# Patient Record
Sex: Female | Born: 1950 | Race: Black or African American | Hispanic: No | Marital: Married | Smoking: Former smoker
Health system: Southern US, Community
[De-identification: ages and names within clinical notes are randomized; demographics above are authoritative.]

## PROBLEM LIST (undated history)

## (undated) DIAGNOSIS — D649 Anemia, unspecified: Secondary | ICD-10-CM

## (undated) DIAGNOSIS — E119 Type 2 diabetes mellitus without complications: Secondary | ICD-10-CM

## (undated) DIAGNOSIS — I10 Essential (primary) hypertension: Secondary | ICD-10-CM

## (undated) DIAGNOSIS — I509 Heart failure, unspecified: Secondary | ICD-10-CM

## (undated) DIAGNOSIS — M109 Gout, unspecified: Secondary | ICD-10-CM

## (undated) DIAGNOSIS — F329 Major depressive disorder, single episode, unspecified: Secondary | ICD-10-CM

## (undated) DIAGNOSIS — K219 Gastro-esophageal reflux disease without esophagitis: Secondary | ICD-10-CM

## (undated) DIAGNOSIS — F32A Depression, unspecified: Secondary | ICD-10-CM

## (undated) DIAGNOSIS — M1990 Unspecified osteoarthritis, unspecified site: Secondary | ICD-10-CM

## (undated) HISTORY — PX: EYE SURGERY: SHX253

## (undated) HISTORY — PX: OTHER SURGICAL HISTORY: SHX169

## (undated) HISTORY — PX: TONSILLECTOMY: SUR1361

## (undated) HISTORY — PX: ABDOMINAL HYSTERECTOMY: SHX81

---

## 2003-11-30 ENCOUNTER — Other Ambulatory Visit: Payer: Self-pay

## 2004-08-10 ENCOUNTER — Emergency Department: Payer: Self-pay | Admitting: Emergency Medicine

## 2004-11-29 ENCOUNTER — Other Ambulatory Visit: Payer: Self-pay

## 2004-11-29 ENCOUNTER — Ambulatory Visit: Payer: Self-pay | Admitting: Orthopedic Surgery

## 2004-12-05 ENCOUNTER — Ambulatory Visit: Payer: Self-pay | Admitting: Orthopedic Surgery

## 2005-06-18 ENCOUNTER — Emergency Department: Payer: Self-pay | Admitting: Internal Medicine

## 2006-01-01 ENCOUNTER — Ambulatory Visit: Payer: Self-pay | Admitting: Family Medicine

## 2006-09-16 ENCOUNTER — Emergency Department: Payer: Self-pay | Admitting: Emergency Medicine

## 2007-09-22 ENCOUNTER — Emergency Department: Payer: Self-pay | Admitting: Emergency Medicine

## 2008-01-16 ENCOUNTER — Emergency Department: Payer: Self-pay | Admitting: Emergency Medicine

## 2008-04-22 ENCOUNTER — Ambulatory Visit: Payer: Self-pay | Admitting: Family Medicine

## 2008-06-24 ENCOUNTER — Emergency Department: Payer: Self-pay | Admitting: Emergency Medicine

## 2009-07-20 ENCOUNTER — Emergency Department: Payer: Self-pay | Admitting: Emergency Medicine

## 2009-09-07 ENCOUNTER — Emergency Department: Payer: Self-pay | Admitting: Emergency Medicine

## 2009-09-26 ENCOUNTER — Ambulatory Visit: Payer: Self-pay | Admitting: Family Medicine

## 2009-09-29 ENCOUNTER — Emergency Department: Payer: Self-pay | Admitting: Emergency Medicine

## 2009-11-04 ENCOUNTER — Emergency Department: Payer: Self-pay | Admitting: Emergency Medicine

## 2009-11-17 ENCOUNTER — Ambulatory Visit: Payer: Self-pay | Admitting: Cardiology

## 2009-11-29 ENCOUNTER — Ambulatory Visit: Payer: Self-pay | Admitting: Internal Medicine

## 2010-02-09 ENCOUNTER — Inpatient Hospital Stay: Payer: Self-pay | Admitting: Internal Medicine

## 2010-03-09 ENCOUNTER — Ambulatory Visit: Payer: Self-pay | Admitting: Family

## 2010-04-26 ENCOUNTER — Ambulatory Visit: Payer: Self-pay | Admitting: Family

## 2010-08-23 ENCOUNTER — Ambulatory Visit: Payer: Self-pay | Admitting: Family

## 2010-09-14 ENCOUNTER — Emergency Department: Payer: Self-pay | Admitting: Emergency Medicine

## 2010-10-05 ENCOUNTER — Emergency Department: Payer: Self-pay | Admitting: Unknown Physician Specialty

## 2010-10-24 ENCOUNTER — Emergency Department: Payer: Self-pay | Admitting: Unknown Physician Specialty

## 2010-11-07 ENCOUNTER — Emergency Department: Payer: Self-pay | Admitting: Internal Medicine

## 2010-12-20 ENCOUNTER — Ambulatory Visit: Payer: Self-pay | Admitting: Family

## 2011-06-01 ENCOUNTER — Emergency Department: Payer: Self-pay | Admitting: Emergency Medicine

## 2011-12-07 ENCOUNTER — Emergency Department: Payer: Self-pay | Admitting: Unknown Physician Specialty

## 2011-12-07 LAB — URINALYSIS, COMPLETE
Blood: NEGATIVE
Glucose,UR: NEGATIVE mg/dL (ref 0–75)
Ketone: NEGATIVE
Nitrite: NEGATIVE
Specific Gravity: 1.015 (ref 1.003–1.030)
Squamous Epithelial: 2
WBC UR: 7 /HPF (ref 0–5)

## 2011-12-07 LAB — CBC WITH DIFFERENTIAL/PLATELET
Basophil %: 0.6 %
Eosinophil %: 2.7 %
HCT: 35.4 % (ref 35.0–47.0)
HGB: 11.2 g/dL — ABNORMAL LOW (ref 12.0–16.0)
Lymphocyte #: 2.4 10*3/uL (ref 1.0–3.6)
Lymphocyte %: 32.1 %
MCHC: 31.8 g/dL — ABNORMAL LOW (ref 32.0–36.0)
MCV: 74 fL — ABNORMAL LOW (ref 80–100)
Monocyte #: 0.8 10*3/uL — ABNORMAL HIGH (ref 0.0–0.7)
Monocyte %: 11.3 %
Neutrophil #: 3.9 10*3/uL (ref 1.4–6.5)
WBC: 7.4 10*3/uL (ref 3.6–11.0)

## 2011-12-07 LAB — TROPONIN I: Troponin-I: <0.02 ng/mL

## 2011-12-07 LAB — COMPREHENSIVE METABOLIC PANEL
Albumin: 3.7 g/dL (ref 3.4–5.0)
BUN: 19 mg/dL — ABNORMAL HIGH (ref 7–18)
Bilirubin,Total: 0.3 mg/dL (ref 0.2–1.0)
Chloride: 104 mmol/L (ref 98–107)
Creatinine: 0.72 mg/dL (ref 0.60–1.30)
EGFR (African American): >60
EGFR (Non-African Amer.): >60
Glucose: 118 mg/dL — ABNORMAL HIGH (ref 65–99)
Osmolality: 286 (ref 275–301)
Potassium: 4 mmol/L (ref 3.5–5.1)
SGOT(AST): 25 U/L (ref 15–37)
SGPT (ALT): 33 U/L
Sodium: 142 mmol/L (ref 136–145)
Total Protein: 8 g/dL (ref 6.4–8.2)

## 2011-12-07 LAB — CK TOTAL AND CKMB (NOT AT ARMC): CK, Total: 77 U/L (ref 21–215)

## 2011-12-07 LAB — PROTIME-INR
INR: 0.9
Prothrombin Time: 12.2 secs (ref 11.5–14.7)

## 2012-03-21 ENCOUNTER — Ambulatory Visit: Payer: Self-pay | Admitting: Internal Medicine

## 2012-10-16 ENCOUNTER — Emergency Department: Payer: Self-pay | Admitting: Emergency Medicine

## 2012-10-16 LAB — CBC
HCT: 33.4 % — ABNORMAL LOW (ref 35.0–47.0)
HGB: 10.6 g/dL — ABNORMAL LOW (ref 12.0–16.0)
MCH: 23.1 pg — ABNORMAL LOW (ref 26.0–34.0)
MCHC: 31.8 g/dL — ABNORMAL LOW (ref 32.0–36.0)
MCV: 73 fL — ABNORMAL LOW (ref 80–100)
RDW: 17.4 % — ABNORMAL HIGH (ref 11.5–14.5)
WBC: 10.7 10*3/uL (ref 3.6–11.0)

## 2012-10-16 LAB — CK TOTAL AND CKMB (NOT AT ARMC)
CK, Total: 105 U/L (ref 21–215)
CK-MB: 0.8 ng/mL (ref 0.5–3.6)

## 2012-10-16 LAB — COMPREHENSIVE METABOLIC PANEL
Albumin: 3.5 g/dL (ref 3.4–5.0)
Anion Gap: 10 (ref 7–16)
Calcium, Total: 8.8 mg/dL (ref 8.5–10.1)
Co2: 23 mmol/L (ref 21–32)
EGFR (Non-African Amer.): >60
Glucose: 161 mg/dL — ABNORMAL HIGH (ref 65–99)
Potassium: 3.8 mmol/L (ref 3.5–5.1)
SGOT(AST): 25 U/L (ref 15–37)
SGPT (ALT): 31 U/L (ref 12–78)
Total Protein: 7.7 g/dL (ref 6.4–8.2)

## 2012-10-16 LAB — PROTIME-INR: INR: 1

## 2012-12-09 ENCOUNTER — Emergency Department: Payer: Self-pay | Admitting: Emergency Medicine

## 2012-12-09 LAB — URINALYSIS, COMPLETE
Glucose,UR: NEGATIVE mg/dL (ref 0–75)
Hyaline Cast: 30
Ph: 5 (ref 4.5–8.0)
Protein: NEGATIVE
Transitional Epi: <1
WBC UR: 5 /HPF (ref 0–5)

## 2012-12-09 LAB — CBC
HCT: 39.1 % (ref 35.0–47.0)
HGB: 12.6 g/dL (ref 12.0–16.0)
MCHC: 32.3 g/dL (ref 32.0–36.0)
MCV: 73 fL — ABNORMAL LOW (ref 80–100)
RBC: 5.37 10*6/uL — ABNORMAL HIGH (ref 3.80–5.20)
RDW: 17.3 % — ABNORMAL HIGH (ref 11.5–14.5)
WBC: 5.3 10*3/uL (ref 3.6–11.0)

## 2012-12-09 LAB — COMPREHENSIVE METABOLIC PANEL
Alkaline Phosphatase: 158 U/L — ABNORMAL HIGH (ref 50–136)
Anion Gap: 9 (ref 7–16)
Calcium, Total: 9 mg/dL (ref 8.5–10.1)
Chloride: 104 mmol/L (ref 98–107)
Creatinine: 0.88 mg/dL (ref 0.60–1.30)
EGFR (African American): >60
Glucose: 204 mg/dL — ABNORMAL HIGH (ref 65–99)
Potassium: 3.6 mmol/L (ref 3.5–5.1)
SGOT(AST): 119 U/L — ABNORMAL HIGH (ref 15–37)
SGPT (ALT): 118 U/L — ABNORMAL HIGH (ref 12–78)

## 2012-12-09 LAB — CK TOTAL AND CKMB (NOT AT ARMC)
CK, Total: 91 U/L (ref 21–215)
CK-MB: <0.5 ng/mL — ABNORMAL LOW (ref 0.5–3.6)

## 2012-12-09 LAB — TROPONIN I: Troponin-I: <0.02 ng/mL

## 2012-12-09 LAB — PRO B NATRIURETIC PEPTIDE: B-Type Natriuretic Peptide: 14 pg/mL (ref 0–125)

## 2012-12-09 LAB — LIPASE, BLOOD: Lipase: 134 U/L (ref 73–393)

## 2013-03-25 ENCOUNTER — Emergency Department: Payer: Self-pay | Admitting: Emergency Medicine

## 2013-08-13 ENCOUNTER — Ambulatory Visit: Payer: Self-pay | Admitting: Internal Medicine

## 2013-09-18 ENCOUNTER — Emergency Department: Payer: Self-pay | Admitting: Emergency Medicine

## 2013-09-18 LAB — URINALYSIS, COMPLETE
Glucose,UR: NEGATIVE mg/dL (ref 0–75)
RBC,UR: 1 /HPF (ref 0–5)
Specific Gravity: 1.013 (ref 1.003–1.030)
WBC UR: 37 /HPF (ref 0–5)

## 2013-09-20 ENCOUNTER — Emergency Department: Payer: Self-pay | Admitting: Emergency Medicine

## 2014-03-21 DIAGNOSIS — E1169 Type 2 diabetes mellitus with other specified complication: Secondary | ICD-10-CM | POA: Insufficient documentation

## 2014-03-21 DIAGNOSIS — I10 Essential (primary) hypertension: Secondary | ICD-10-CM | POA: Diagnosis present

## 2014-07-26 ENCOUNTER — Emergency Department: Payer: Self-pay | Admitting: Emergency Medicine

## 2014-08-01 ENCOUNTER — Emergency Department: Payer: Self-pay | Admitting: Emergency Medicine

## 2014-09-02 ENCOUNTER — Emergency Department: Payer: Self-pay | Admitting: Emergency Medicine

## 2014-09-02 LAB — BASIC METABOLIC PANEL
Anion Gap: 6 — ABNORMAL LOW (ref 7–16)
BUN: 14 mg/dL (ref 7–18)
Calcium, Total: 8.4 mg/dL — ABNORMAL LOW (ref 8.5–10.1)
Chloride: 104 mmol/L (ref 98–107)
Co2: 28 mmol/L (ref 21–32)
Creatinine: 0.79 mg/dL (ref 0.60–1.30)
EGFR (African American): >60
GLUCOSE: 174 mg/dL — AB (ref 65–99)
Osmolality: 280 (ref 275–301)
POTASSIUM: 3.8 mmol/L (ref 3.5–5.1)
Sodium: 138 mmol/L (ref 136–145)

## 2014-09-02 LAB — CBC WITH DIFFERENTIAL/PLATELET
BASOS ABS: 0.1 10*3/uL (ref 0.0–0.1)
Basophil %: 0.6 %
EOS PCT: 2.4 %
Eosinophil #: 0.3 10*3/uL (ref 0.0–0.7)
HCT: 37.6 % (ref 35.0–47.0)
HGB: 11.8 g/dL — ABNORMAL LOW (ref 12.0–16.0)
Lymphocyte #: 1.7 10*3/uL (ref 1.0–3.6)
Lymphocyte %: 16 %
MCH: 23.2 pg — ABNORMAL LOW (ref 26.0–34.0)
MCHC: 31.3 g/dL — ABNORMAL LOW (ref 32.0–36.0)
MCV: 74 fL — AB (ref 80–100)
Monocyte #: 0.7 x10 3/mm (ref 0.2–0.9)
Monocyte %: 6.8 %
NEUTROS ABS: 7.9 10*3/uL — AB (ref 1.4–6.5)
NEUTROS PCT: 74.2 %
PLATELETS: 202 10*3/uL (ref 150–440)
RBC: 5.07 10*6/uL (ref 3.80–5.20)
RDW: 15.8 % — ABNORMAL HIGH (ref 11.5–14.5)
WBC: 10.6 10*3/uL (ref 3.6–11.0)

## 2014-09-02 LAB — ED INFLUENZA
H1N1 flu by pcr: NOT DETECTED
INFLBPCR: NEGATIVE
Influenza A By PCR: NEGATIVE

## 2014-09-02 LAB — TROPONIN I: Troponin-I: <0.02 ng/mL

## 2014-10-18 ENCOUNTER — Other Ambulatory Visit: Payer: Self-pay | Admitting: Physical Medicine and Rehabilitation

## 2014-10-18 DIAGNOSIS — M5416 Radiculopathy, lumbar region: Secondary | ICD-10-CM

## 2014-10-24 ENCOUNTER — Other Ambulatory Visit: Payer: Self-pay

## 2014-12-12 ENCOUNTER — Emergency Department: Payer: Self-pay | Admitting: Emergency Medicine

## 2015-01-11 ENCOUNTER — Emergency Department: Payer: Self-pay | Admitting: Emergency Medicine

## 2015-02-09 ENCOUNTER — Ambulatory Visit
Admission: RE | Admit: 2015-02-09 | Discharge: 2015-02-09 | Disposition: A | Discharge status: Home / Self Care | Payer: No Typology Code available for payment source | Source: Ambulatory Visit | Attending: Physical Medicine and Rehabilitation | Admitting: Physical Medicine and Rehabilitation

## 2015-02-09 DIAGNOSIS — M5416 Radiculopathy, lumbar region: Secondary | ICD-10-CM

## 2015-03-27 ENCOUNTER — Emergency Department
Admission: EM | Admit: 2015-03-27 | Discharge: 2015-03-28 | Disposition: A | Discharge status: Home / Self Care | Payer: Medicare Other | Attending: Emergency Medicine | Admitting: Emergency Medicine

## 2015-03-27 ENCOUNTER — Other Ambulatory Visit: Payer: Self-pay

## 2015-03-27 ENCOUNTER — Encounter: Payer: Self-pay | Admitting: Emergency Medicine

## 2015-03-27 ENCOUNTER — Emergency Department: Payer: Medicare Other

## 2015-03-27 DIAGNOSIS — Z87891 Personal history of nicotine dependence: Secondary | ICD-10-CM | POA: Diagnosis not present

## 2015-03-27 DIAGNOSIS — R1012 Left upper quadrant pain: Secondary | ICD-10-CM | POA: Diagnosis not present

## 2015-03-27 DIAGNOSIS — Z794 Long term (current) use of insulin: Secondary | ICD-10-CM | POA: Diagnosis not present

## 2015-03-27 DIAGNOSIS — Z79899 Other long term (current) drug therapy: Secondary | ICD-10-CM | POA: Diagnosis not present

## 2015-03-27 DIAGNOSIS — I10 Essential (primary) hypertension: Secondary | ICD-10-CM | POA: Insufficient documentation

## 2015-03-27 DIAGNOSIS — E119 Type 2 diabetes mellitus without complications: Secondary | ICD-10-CM | POA: Insufficient documentation

## 2015-03-27 DIAGNOSIS — R079 Chest pain, unspecified: Secondary | ICD-10-CM

## 2015-03-27 HISTORY — DX: Type 2 diabetes mellitus without complications: E11.9

## 2015-03-27 HISTORY — DX: Heart failure, unspecified: I50.9

## 2015-03-27 HISTORY — DX: Essential (primary) hypertension: I10

## 2015-03-27 LAB — BASIC METABOLIC PANEL
ANION GAP: 11 (ref 5–15)
BUN: 12 mg/dL (ref 6–20)
CALCIUM: 9.1 mg/dL (ref 8.9–10.3)
CO2: 27 mmol/L (ref 22–32)
Chloride: 100 mmol/L — ABNORMAL LOW (ref 101–111)
Creatinine, Ser: 0.81 mg/dL (ref 0.44–1.00)
GFR calc Af Amer: >60 mL/min (ref >60)
Glucose, Bld: 202 mg/dL — ABNORMAL HIGH (ref 65–99)
Potassium: 3.9 mmol/L (ref 3.5–5.1)
SODIUM: 138 mmol/L (ref 135–145)

## 2015-03-27 LAB — CBC
HCT: 37.2 % (ref 35.0–47.0)
Hemoglobin: 11.8 g/dL — ABNORMAL LOW (ref 12.0–16.0)
MCH: 22.8 pg — AB (ref 26.0–34.0)
MCHC: 31.6 g/dL — ABNORMAL LOW (ref 32.0–36.0)
MCV: 72.1 fL — AB (ref 80.0–100.0)
PLATELETS: 247 10*3/uL (ref 150–440)
RBC: 5.15 MIL/uL (ref 3.80–5.20)
RDW: 15.6 % — AB (ref 11.5–14.5)
WBC: 11.2 10*3/uL — ABNORMAL HIGH (ref 3.6–11.0)

## 2015-03-27 LAB — BRAIN NATRIURETIC PEPTIDE: B Natriuretic Peptide: 32 pg/mL (ref 0.0–100.0)

## 2015-03-27 LAB — TROPONIN I: Troponin I: <0.03 ng/mL (ref <0.031)

## 2015-03-27 NOTE — ED Notes (Signed)
Patient with complaint of left side chest, shoulder and upper back pain that started this morning. Patient with complaint of shortness of breath.

## 2015-03-28 ENCOUNTER — Emergency Department: Payer: Medicare Other

## 2015-03-28 DIAGNOSIS — R079 Chest pain, unspecified: Secondary | ICD-10-CM | POA: Diagnosis not present

## 2015-03-28 LAB — TROPONIN I: Troponin I: <0.03 ng/mL (ref <0.031)

## 2015-03-28 MED ORDER — GI COCKTAIL ~~LOC~~
30.0000 mL | Freq: Once | ORAL | Status: AC
  Administered 2015-03-28: 30 mL via ORAL

## 2015-03-28 MED ORDER — SUCRALFATE 1 G PO TABS: 1.0000 g | ORAL_TABLET | Freq: Four times a day (QID) | ORAL | Status: DC

## 2015-03-28 MED ORDER — NITROGLYCERIN 0.4 MG SL SUBL
0.4000 mg | SUBLINGUAL_TABLET | SUBLINGUAL | Status: DC | PRN
  Administered 2015-03-28: 0.4 mg via SUBLINGUAL

## 2015-03-28 MED ORDER — SUCRALFATE 1 G PO TABS
ORAL_TABLET | ORAL | Status: AC
  Administered 2015-03-28: 1 g via ORAL
  Filled 2015-03-28: qty 1

## 2015-03-28 MED ORDER — GI COCKTAIL ~~LOC~~
ORAL | Status: AC
  Administered 2015-03-28: 30 mL via ORAL
  Filled 2015-03-28: qty 30

## 2015-03-28 MED ORDER — NITROGLYCERIN 0.4 MG SL SUBL
SUBLINGUAL_TABLET | SUBLINGUAL | Status: AC
  Administered 2015-03-28: 0.4 mg via SUBLINGUAL
  Filled 2015-03-28: qty 1

## 2015-03-28 MED ORDER — SUCRALFATE 1 G PO TABS
1.0000 g | ORAL_TABLET | Freq: Once | ORAL | Status: AC
  Administered 2015-03-28: 1 g via ORAL

## 2015-03-28 MED ORDER — IOHEXOL 350 MG/ML SOLN
100.0000 mL | Freq: Once | INTRAVENOUS | Status: AC | PRN
  Administered 2015-03-28: 100 mL via INTRAVENOUS

## 2015-03-28 NOTE — Discharge Instructions (Signed)
Abdominal Pain °Many things can cause abdominal pain. Usually, abdominal pain is not caused by a disease and will improve without treatment. It can often be observed and treated at home. Your health care provider will do a physical exam and possibly order blood tests and X-rays to help determine the seriousness of your pain. However, in many cases, more time must pass before a clear cause of the pain can be found. Before that point, your health care provider may not know if you need more testing or further treatment. °HOME CARE INSTRUCTIONS  °Monitor your abdominal pain for any changes. The following actions may help to alleviate any discomfort you are experiencing: °· Only take over-the-counter or prescription medicines as directed by your health care provider. °· Do not take laxatives unless directed to do so by your health care provider. °· Try a clear liquid diet (broth, tea, or water) as directed by your health care provider. Slowly move to a bland diet as tolerated. °SEEK MEDICAL CARE IF: °· You have unexplained abdominal pain. °· You have abdominal pain associated with nausea or diarrhea. °· You have pain when you urinate or have a bowel movement. °· You experience abdominal pain that wakes you in the night. °· You have abdominal pain that is worsened or improved by eating food. °· You have abdominal pain that is worsened with eating fatty foods. °· You have a fever. °SEEK IMMEDIATE MEDICAL CARE IF:  °· Your pain does not go away within 2 hours. °· You keep throwing up (vomiting). °· Your pain is felt only in portions of the abdomen, such as the right side or the left lower portion of the abdomen. °· You pass bloody or black tarry stools. °MAKE SURE YOU: °· Understand these instructions.   °· Will watch your condition.   °· Will get help right away if you are not doing well or get worse.   °Document Released: 07/18/2005 Document Revised: 10/13/2013 Document Reviewed: 06/17/2013 °[employer]® Patient Information  ©2015 [employer]. This information is not intended to replace advice given to you by your health care provider. Make sure you discuss any questions you have with your health care provider. ° °Chest Pain (Nonspecific) °It is often hard to give a specific diagnosis for the cause of chest pain. There is always a chance that your pain could be related to something serious, such as a heart attack or a blood clot in the lungs. You need to follow up with your health care provider for further evaluation. °CAUSES  °· Heartburn. °· Pneumonia or bronchitis. °· Anxiety or stress. °· Inflammation around your heart (pericarditis) or lung (pleuritis or pleurisy). °· A blood clot in the lung. °· A collapsed lung (pneumothorax). It can develop suddenly on its own (spontaneous pneumothorax) or from trauma to the chest. °· Shingles infection (herpes zoster virus). °The chest wall is composed of bones, muscles, and cartilage. Any of these can be the source of the pain. °· The bones can be bruised by injury. °· The muscles or cartilage can be strained by coughing or overwork. °· The cartilage can be affected by inflammation and become sore (costochondritis). °DIAGNOSIS  °Lab tests or other studies may be needed to find the cause of your pain. Your health care provider may have you take a test called an ambulatory electrocardiogram (ECG). An ECG records your heartbeat patterns over a 24-hour period. You may also have other tests, such as: °· Transthoracic echocardiogram (TTE). During echocardiography, sound waves are used to evaluate how blood   flows through your heart. °· Transesophageal echocardiogram (TEE). °· Cardiac monitoring. This allows your health care provider to monitor your heart rate and rhythm in real time. °· Holter monitor. This is a portable device that records your heartbeat and can help diagnose heart arrhythmias. It allows your health care provider to track your heart activity for several days, if needed. °· Stress  tests by exercise or by giving medicine that makes the heart beat faster. °TREATMENT  °· Treatment depends on what may be causing your chest pain. Treatment may include: °¨ Acid blockers for heartburn. °¨ Anti-inflammatory medicine. °¨ Pain medicine for inflammatory conditions. °¨ Antibiotics if an infection is present. °· You may be advised to change lifestyle habits. This includes stopping smoking and avoiding alcohol, caffeine, and chocolate. °· You may be advised to keep your head raised (elevated) when sleeping. This reduces the chance of acid going backward from your stomach into your esophagus. °Most of the time, nonspecific chest pain will improve within 2-3 days with rest and mild pain medicine.  °HOME CARE INSTRUCTIONS  °· If antibiotics were prescribed, take them as directed. Finish them even if you start to feel better. °· For the next few days, avoid physical activities that bring on chest pain. Continue physical activities as directed. °· Do not use any tobacco products, including cigarettes, chewing tobacco, or electronic cigarettes. °· Avoid drinking alcohol. °· Only take medicine as directed by your health care provider. °· Follow your health care provider's suggestions for further testing if your chest pain does not go away. °· Keep any follow-up appointments you made. If you do not go to an appointment, you could develop lasting (chronic) problems with pain. If there is any problem keeping an appointment, call to reschedule. °SEEK MEDICAL CARE IF:  °· Your chest pain does not go away, even after treatment. °· You have a rash with blisters on your chest. °· You have a fever. °SEEK IMMEDIATE MEDICAL CARE IF:  °· You have increased chest pain or pain that spreads to your arm, neck, jaw, back, or abdomen. °· You have shortness of breath. °· You have an increasing cough, or you cough up blood. °· You have severe back or abdominal pain. °· You feel nauseous or vomit. °· You have severe weakness. °· You  faint. °· You have chills. °This is an emergency. Do not wait to see if the pain will go away. Get medical help at once. Call your local emergency services (911 in [place] Do not drive yourself to the hospital. °MAKE SURE YOU:  °· Understand these instructions. °· Will watch your condition. °· Will get help right away if you are not doing well or get worse. °Document Released: 07/18/2005 Document Revised: 10/13/2013 Document Reviewed: 05/13/2008 °[employer]® Patient Information ©2015 [employer]. This information is not intended to replace advice given to you by your health care provider. Make sure you discuss any questions you have with your health care provider. ° °

## 2015-03-28 NOTE — ED Notes (Signed)
Pt up to toilet in room to void; back to bed

## 2015-03-28 NOTE — ED Provider Notes (Signed)
[facility] Emergency Department Provider Note  ____________________________________________  Time seen: Approximately 1601 pm  I have reviewed the triage vital signs and the nursing notes.   HISTORY  Chief Complaint Chest Pain    HPI Betty Luna is a 64 y.o. female who comes in today with chest pain. She reports that she's had this pain all day under her left breast into her shoulder and her back. She reports that feels like gas. She took baking soda and omeprazole. She reports that the pain eases off but returns. She feels short of breath whenever she has this pain as well. She denies any sweats but has chills. She reports that the pain is 7 out of 10 and is aching in intensity. The patient reports that she feels that she needs to burp but she is unable to do so. Nothing makes the pain better but is worse whenever she lays down. She feels that she has had this pain before but it was a long time ago. She is nauseous with leg swelling but no vomiting. She reports that she was placed on omeprazole 2 weeks ago.   Past Medical History  Diagnosis Date  . CHF (congestive heart failure)   . Diabetes mellitus without complication   . Hypertension   . Multiple myeloma   Diabetes Hypertension GERD Arthritis Gout  There are no active problems to display for this patient.   No past surgical history on file.  Current Outpatient Rx  Name  Route  Sig  Dispense  Refill  . allopurinol (ZYLOPRIM) 300 MG tablet   Oral   Take 1 tablet by mouth daily.      5   . atorvastatin (LIPITOR) 20 MG tablet   Oral   Take 1 tablet by mouth daily.      11   . carvedilol (COREG) 25 MG tablet   Oral   Take 1 tablet by mouth 2 (two) times daily.      3   . colchicine 0.6 MG tablet   Oral   Take 1 tablet by mouth daily.      3   . insulin detemir (LEVEMIR) 100 UNIT/ML injection   Subcutaneous   Inject 174 Units into the skin at bedtime.         Marland Kitchen losartan  (COZAAR) 50 MG tablet   Oral   Take 1 tablet by mouth daily.      2   . metFORMIN (GLUCOPHAGE-XR) 500 MG 24 hr tablet   Oral   Take 1 tablet by mouth 2 (two) times daily.      1   . omeprazole (PRILOSEC) 40 MG capsule   Oral   Take 1 capsule by mouth daily.      4   . spironolactone (ALDACTONE) 25 MG tablet   Oral   Take 1 tablet by mouth daily.      3   . sucralfate (CARAFATE) 1 G tablet   Oral   Take 1 tablet (1 g total) by mouth 4 (four) times daily.   30 tablet   0   . torsemide (DEMADEX) 10 MG tablet   Oral   Take 1 tablet by mouth daily.      0     Allergies Codeine  No family history on file.  Social History History  Substance Use Topics  . Smoking status: Former Research scientist (life sciences)  . Smokeless tobacco: Not on file  . Alcohol Use: Yes     Comment: occasonally  Review of Systems Constitutional: chills Eyes: No visual changes. ENT: No sore throat. Cardiovascular:  chest pain. Respiratory:shortness of breath. Gastrointestinal: Nausea with no vomiting Genitourinary: Negative for dysuria. Musculoskeletal: back pain. Skin: Negative for rash. Neurological: Negative for headaches  10-point ROS otherwise negative.  ____________________________________________   PHYSICAL EXAM:  VITAL SIGNS: ED Triage Vitals  Enc Vitals Group     BP 03/27/15 2124 139/66 mmHg     Pulse Rate 03/27/15 2124 111     Resp 03/27/15 2124 24     Temp 03/27/15 2124 98.5 F (36.9 C)     Temp Source 03/27/15 2124 Oral     SpO2 03/27/15 2124 95 %     Weight 03/27/15 2124 244 lb (110.678 kg)     Height 03/27/15 2124 5' 3" (1.6 m)     Head Cir --      Peak Flow --      Pain Score 03/27/15 2125 10     Pain Loc --      Pain Edu? --      Excl. in [place]? --     Constitutional: Alert and oriented. Well appearing and in moderate distress. Eyes: Conjunctivae are normal. PERRL. EOMI. Head: Atraumatic. Nose: No congestion/rhinnorhea. Mouth/Throat: Mucous membranes are moist.   Oropharynx non-erythematous. Cardiovascular: Normal rate, regular rhythm. Grossly normal heart sounds.  Good peripheral circulation. Respiratory: Normal respiratory effort.  No retractions. Lungs CTAB. Gastrointestinal: Soft with left quadrant tenderness to palpation, positive bowel sounds that Genitourinary: Deferred Musculoskeletal: No lower extremity tenderness nor edema.  No joint effusions. Neurologic:  Normal speech and language. No gross focal neurologic deficits are appreciated.  Skin:  Skin is warm, dry and intact. No rash noted. Psychiatric: Mood and affect are normal.   ____________________________________________   LABS (all labs ordered are listed, but only abnormal results are displayed)  Labs Reviewed  CBC - Abnormal; Notable for the following:    WBC 11.2 (*)    Hemoglobin 11.8 (*)    MCV 72.1 (*)    MCH 22.8 (*)    MCHC 31.6 (*)    RDW 15.6 (*)    All other components within normal limits  BASIC METABOLIC PANEL - Abnormal; Notable for the following:    Chloride 100 (*)    Glucose, Bld 202 (*)    All other components within normal limits  BRAIN NATRIURETIC PEPTIDE  TROPONIN I  TROPONIN I   ____________________________________________  EKG  ED ECG REPORT I, Loney Hering, the attending physician, personally viewed and interpreted this ECG.   Date: 03/28/2015  EKG Time: 2133  Rate: 106  Rhythm: sinus tachycardia  Axis: Normal  Intervals:none  ST&T Change: Flattened T waves in V5 and V6 as well as leads 1 and aVL  ____________________________________________  RADIOLOGY  Chest x-ray: Low lung volumes, mild vascular congestion, focal area of consolidation not established but mild bibasilar atelectasis is noted.  CT chest angiography: Normal caliber thoracic aorta without aneurysm or dissection, mild atherosclerosis, no central pulmonary embolus, small circumferential pericardial effusion, linear opacities consistent with atelectasis in the lower  lung zones. 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 ____________________________________________   PROCEDURES  Procedure(s) performed: None  Critical Care performed: No  ____________________________________________   INITIAL IMPRESSION / ASSESSMENT AND PLAN / ED COURSE  Pertinent labs & imaging results that were available during my care of the patient were reviewed by me and considered in my medical decision making (see chart for details).  This is a 64 year old female who comes in today with chest pain that has been going on all day long. I will give the patient GI cocktail as she does feel this is GI in nature but I will also give the patient some nitroglycerin to see if this may be cardiac. I will repeat the patient's heart enzymes to determine if she does have any cardiac cause to her pain.  The patient received GI cocktail and nitroglycerin. She reports that her pain is improved but still does rated as a 6- 7/10. The patient is sleeping on the stretcher comfortably without any difficulty. I informed the patient that her CT scan and 2 troponins are all negative. At this time I feel that the patient can be discharged home to follow-up with Dr. Darrow Bussing for further evaluation and treatment. The patient reports that she feels that this is due to her stomach as she does feel like she has to burp. I will give the patient a dose of Carafate discharge the patient to home. The patient does understand the plan and she agrees with the plan as stated. ____________________________________________   FINAL CLINICAL IMPRESSION(S) / ED DIAGNOSES  Final diagnoses:  Chest pain  Left upper quadrant pain      Loney Hering, MD 03/28/15 [phone]

## 2015-08-08 DIAGNOSIS — E049 Nontoxic goiter, unspecified: Secondary | ICD-10-CM | POA: Insufficient documentation

## 2015-08-08 DIAGNOSIS — E1165 Type 2 diabetes mellitus with hyperglycemia: Secondary | ICD-10-CM | POA: Insufficient documentation

## 2015-08-08 DIAGNOSIS — E1122 Type 2 diabetes mellitus with diabetic chronic kidney disease: Secondary | ICD-10-CM | POA: Insufficient documentation

## 2015-08-09 ENCOUNTER — Emergency Department: Payer: Medicare Other

## 2015-08-09 ENCOUNTER — Emergency Department
Admission: EM | Admit: 2015-08-09 | Discharge: 2015-08-09 | Disposition: A | Discharge status: Home / Self Care | Payer: Medicare Other | Attending: Emergency Medicine | Admitting: Emergency Medicine

## 2015-08-09 DIAGNOSIS — R109 Unspecified abdominal pain: Secondary | ICD-10-CM | POA: Diagnosis present

## 2015-08-09 DIAGNOSIS — E119 Type 2 diabetes mellitus without complications: Secondary | ICD-10-CM | POA: Diagnosis not present

## 2015-08-09 DIAGNOSIS — N390 Urinary tract infection, site not specified: Secondary | ICD-10-CM | POA: Diagnosis not present

## 2015-08-09 DIAGNOSIS — Z87891 Personal history of nicotine dependence: Secondary | ICD-10-CM | POA: Diagnosis not present

## 2015-08-09 DIAGNOSIS — Z79899 Other long term (current) drug therapy: Secondary | ICD-10-CM | POA: Insufficient documentation

## 2015-08-09 DIAGNOSIS — I10 Essential (primary) hypertension: Secondary | ICD-10-CM | POA: Insufficient documentation

## 2015-08-09 DIAGNOSIS — Z794 Long term (current) use of insulin: Secondary | ICD-10-CM | POA: Diagnosis not present

## 2015-08-09 LAB — CBC WITH DIFFERENTIAL/PLATELET
Basophils Absolute: 0.2 10*3/uL — ABNORMAL HIGH (ref 0–0.1)
Basophils Relative: 3 %
Eosinophils Absolute: 0.1 10*3/uL (ref 0–0.7)
Eosinophils Relative: 1 %
HEMATOCRIT: 36.4 % (ref 35.0–47.0)
HEMOGLOBIN: 12.2 g/dL (ref 12.0–16.0)
LYMPHS ABS: 1 10*3/uL (ref 1.0–3.6)
LYMPHS PCT: 14 %
MCH: 23.2 pg — AB (ref 26.0–34.0)
MCHC: 33.6 g/dL (ref 32.0–36.0)
MCV: 69.1 fL — AB (ref 80.0–100.0)
Monocytes Absolute: 0.5 10*3/uL (ref 0.2–0.9)
Monocytes Relative: 7 %
NEUTROS ABS: 5.3 10*3/uL (ref 1.4–6.5)
NEUTROS PCT: 75 %
Platelets: 205 10*3/uL (ref 150–440)
RBC: 5.27 MIL/uL — AB (ref 3.80–5.20)
RDW: 16.1 % — ABNORMAL HIGH (ref 11.5–14.5)
WBC: 7.1 10*3/uL (ref 3.6–11.0)

## 2015-08-09 LAB — COMPREHENSIVE METABOLIC PANEL
ALK PHOS: 124 U/L (ref 38–126)
ALT: 23 U/L (ref 14–54)
AST: 24 U/L (ref 15–41)
Albumin: 3.8 g/dL (ref 3.5–5.0)
Anion gap: 9 (ref 5–15)
BUN: 14 mg/dL (ref 6–20)
CALCIUM: 9.4 mg/dL (ref 8.9–10.3)
CO2: 26 mmol/L (ref 22–32)
CREATININE: 0.89 mg/dL (ref 0.44–1.00)
Chloride: 97 mmol/L — ABNORMAL LOW (ref 101–111)
Glucose, Bld: 297 mg/dL — ABNORMAL HIGH (ref 65–99)
Potassium: 3.8 mmol/L (ref 3.5–5.1)
SODIUM: 132 mmol/L — AB (ref 135–145)
Total Bilirubin: 0.2 mg/dL — ABNORMAL LOW (ref 0.3–1.2)
Total Protein: 7.5 g/dL (ref 6.5–8.1)

## 2015-08-09 LAB — URINALYSIS COMPLETE WITH MICROSCOPIC (ARMC ONLY)
Bilirubin Urine: NEGATIVE
Ketones, ur: NEGATIVE mg/dL
NITRITE: POSITIVE — AB
Protein, ur: NEGATIVE mg/dL
SPECIFIC GRAVITY, URINE: 1.024 (ref 1.005–1.030)
pH: 5 (ref 5.0–8.0)

## 2015-08-09 MED ORDER — OXYCODONE-ACETAMINOPHEN 5-325 MG PO TABS
1.0000 | ORAL_TABLET | Freq: Once | ORAL | Status: AC
  Administered 2015-08-09: 1 via ORAL
  Filled 2015-08-09: qty 1

## 2015-08-09 MED ORDER — SULFAMETHOXAZOLE-TRIMETHOPRIM 800-160 MG PO TABS: 1.0000 | ORAL_TABLET | Freq: Two times a day (BID) | ORAL | Status: AC

## 2015-08-09 MED ORDER — SULFAMETHOXAZOLE-TRIMETHOPRIM 800-160 MG PO TABS
1.0000 | ORAL_TABLET | Freq: Once | ORAL | Status: AC
  Administered 2015-08-09: 1 via ORAL
  Filled 2015-08-09: qty 1

## 2015-08-09 NOTE — ED Provider Notes (Signed)
[facility] Emergency Department Provider Note  ____________________________________________  Time seen: Approximately 215 AM  I have reviewed the triage vital signs and the nursing notes.   HISTORY  Chief Complaint Flank Pain    HPI Betty Luna is a 64 y.o. female who comes into the hospital today because her back and her sides hurt. She also reports that she has pain with urination. She reports that her flank pain is worse on the right than the left. The patient also reports that her urine looks cloudy and has a funny smell to it. The patient reports that she had a temperature at home but it was tactile and she did not take it with a thermometer. The patient has had some nausea with no vomiting. She denies any chest pain or shortness of breath abdominal pain. The patient took some Tylenol at home for her pain but reports that she is still a 9 out of 10 in intensity. The patient decided to come into the hospital because she could not tolerate the pain anymore and she wanted to know what was going on.    Past Medical History  Diagnosis Date  . CHF (congestive heart failure)   . Diabetes mellitus without complication   . Hypertension   . Multiple myeloma     There are no active problems to display for this patient.   Past surgical history Hysterectomy Tonsillectomy C-section   Current Outpatient Rx  Name  Route  Sig  Dispense  Refill  . allopurinol (ZYLOPRIM) 300 MG tablet   Oral   Take 1 tablet by mouth daily.      5   . atorvastatin (LIPITOR) 20 MG tablet   Oral   Take 1 tablet by mouth daily.      11   . carvedilol (COREG) 25 MG tablet   Oral   Take 1 tablet by mouth 2 (two) times daily.      3   . colchicine 0.6 MG tablet   Oral   Take 1 tablet by mouth daily.      3   . Insulin Lispro Prot & Lispro (HUMALOG MIX 75/25 KWIKPEN) (75-25) 100 UNIT/ML Kwikpen   Subcutaneous   Inject 14 Units into the skin 2 (two) times daily  before a meal.         . losartan (COZAAR) 50 MG tablet   Oral   Take 1 tablet by mouth daily.      2   . omeprazole (PRILOSEC) 40 MG capsule   Oral   Take 1 capsule by mouth daily.      4   . spironolactone (ALDACTONE) 25 MG tablet   Oral   Take 1 tablet by mouth daily.      3   . sucralfate (CARAFATE) 1 G tablet   Oral   Take 1 tablet (1 g total) by mouth 4 (four) times daily.   30 tablet   0   . torsemide (DEMADEX) 10 MG tablet   Oral   Take 1 tablet by mouth daily.      0   . sulfamethoxazole-trimethoprim (BACTRIM DS,SEPTRA DS) 800-160 MG tablet   Oral   Take 1 tablet by mouth 2 (two) times daily.   14 tablet   0     Allergies Codeine  No family history on file.  Social History Social History  Substance Use Topics  . Smoking status: Former Research scientist (life sciences)  . Smokeless tobacco: Not on file  . Alcohol  Use: Yes     Comment: occasonally    Review of Systems Constitutional: No fever/chills Eyes: No visual changes. ENT: No sore throat. Cardiovascular: Denies chest pain. Respiratory: Denies shortness of breath. Gastrointestinal: No abdominal pain.  No nausea, no vomiting.  No diarrhea.  No constipation. Genitourinary:  dysuria. Musculoskeletal: Bilateral flank pain Skin: Negative for rash. Neurological: Negative for headaches, focal weakness or numbness.  10-point ROS otherwise negative.  ____________________________________________   PHYSICAL EXAM:  VITAL SIGNS: ED Triage Vitals  Enc Vitals Group     BP 08/09/15 0102 133/71 mmHg     Pulse Rate 08/09/15 0102 102     Resp 08/09/15 0102 18     Temp 08/09/15 0102 99.6 F (37.6 C)     Temp Source 08/09/15 0102 Oral     SpO2 08/09/15 0102 94 %     Weight 08/09/15 0102 234 lb (106.142 kg)     Height 08/09/15 0102 '5\' 2"'  (1.575 m)     Head Cir --      Peak Flow --      Pain Score 08/09/15 0102 10     Pain Loc --      Pain Edu? --      Excl. in [place]? --     Constitutional: Alert and oriented.  Well appearing and in moderate distress. Eyes: Conjunctivae are normal. PERRL. EOMI. Head: Atraumatic. Nose: No congestion/rhinnorhea. Mouth/Throat: Mucous membranes are moist.  Oropharynx non-erythematous. Cardiovascular: Normal rate, regular rhythm. Grossly normal heart sounds.  Good peripheral circulation. Respiratory: Normal respiratory effort.  No retractions. Lungs CTAB. Gastrointestinal: Soft and nontender. No distention. Bilateral CVA tenderness to palpation Musculoskeletal: No lower extremity tenderness nor edema.   Neurologic:  Normal speech and language. No gross focal neurologic deficits are appreciated. No gait instability. Skin:  Skin is warm, dry and intact.  Psychiatric: Mood and affect are normal.   ____________________________________________   LABS (all labs ordered are listed, but only abnormal results are displayed)  Labs Reviewed  CBC WITH DIFFERENTIAL/PLATELET - Abnormal; Notable for the following:    RBC 5.27 (*)    MCV 69.1 (*)    MCH 23.2 (*)    RDW 16.1 (*)    Basophils Absolute 0.2 (*)    All other components within normal limits  COMPREHENSIVE METABOLIC PANEL - Abnormal; Notable for the following:    Sodium 132 (*)    Chloride 97 (*)    Glucose, Bld 297 (*)    Total Bilirubin 0.2 (*)    All other components within normal limits  URINALYSIS COMPLETEWITH MICROSCOPIC ([facility] ONLY) - Abnormal; Notable for the following:    Color, Urine YELLOW (*)    APPearance HAZY (*)    Glucose, UA >500 (*)    Hgb urine dipstick 1+ (*)    Nitrite POSITIVE (*)    Leukocytes, UA 3+ (*)    Bacteria, UA RARE (*)    Squamous Epithelial / LPF 0-5 (*)    All other components within normal limits   ____________________________________________  EKG  None ____________________________________________  RADIOLOGY  CT renal stone protocol: No acute abdomen now he seen within the abdomen or pelvis, 4 mm nonobstructing stone in the lower pole of the left kidney. Scattered  calcification along the distal abdominal aorta and its branches. ____________________________________________   PROCEDURES  Procedure(s) performed: None  Critical Care performed: No  ____________________________________________   INITIAL IMPRESSION / ASSESSMENT AND PLAN / ED COURSE  Pertinent labs & imaging results that were available during  my care of the patient were reviewed by me and considered in my medical decision making (see chart for details).  This is a 64 year old female who comes in today with bilateral flank pain right greater than left. I did perform a CT scan of her kidneys to insure that she did not have a kidney stone that was also infected. The patient does have positive nitrates and too numerous to count whites in her urine. I will give the patient a dose of Bactrim to treat her infection as well as Percocet for her pain. The patient be discharged to home to follow-up with her primary care physician for further evaluation. ____________________________________________   FINAL CLINICAL IMPRESSION(S) / ED DIAGNOSES  Final diagnoses:  Right flank pain  Flank pain  UTI (lower urinary tract infection)      Loney Hering, MD 08/09/15 [phone]

## 2015-08-09 NOTE — ED Notes (Signed)
Pt in with co right flank pain states also has urinary frequency.

## 2015-08-09 NOTE — Discharge Instructions (Signed)
Flank Pain °Flank pain refers to pain that is located on the side of the body between the upper abdomen and the back. The pain may occur over a short period of time (acute) or may be long-term or reoccurring (chronic). It may be mild or severe. Flank pain can be caused by many things. °CAUSES  °Some of the more common causes of flank pain include: °· Muscle strains.   °· Muscle spasms.   °· A disease of your spine (vertebral disk disease).   °· A lung infection (pneumonia).   °· Fluid around your lungs (pulmonary edema).   °· A kidney infection.   °· Kidney stones.   °· A very painful skin rash caused by the chickenpox virus (shingles).   °· Gallbladder disease.   °HOME CARE INSTRUCTIONS  °Home care will depend on the cause of your pain. In general, °· Rest as directed by your caregiver. °· Drink enough fluids to keep your urine clear or pale yellow. °· Only take over-the-counter or prescription medicines as directed by your caregiver. Some medicines may help relieve the pain. °· Tell your caregiver about any changes in your pain. °· Follow up with your caregiver as directed. °SEEK IMMEDIATE MEDICAL CARE IF:  °· Your pain is not controlled with medicine.   °· You have new or worsening symptoms. °· Your pain increases.   °· You have abdominal pain.   °· You have shortness of breath.   °· You have persistent nausea or vomiting.   °· You have swelling in your abdomen.   °· You feel faint or pass out.   °· You have blood in your urine. °· You have a fever or persistent symptoms for more than 2-3 days. °· You have a fever and your symptoms suddenly get worse. °MAKE SURE YOU:  °· Understand these instructions. °· Will watch your condition. °· Will get help right away if you are not doing well or get worse. °  °This information is not intended to replace advice given to you by your health care provider. Make sure you discuss any questions you have with your health care provider. °  °Document Released: 11/29/2005 Document  Revised: 07/02/2012 Document Reviewed: 05/22/2012 °Elsevier Interactive Patient Education ©2016 Elsevier Inc. ° °Urinary Tract Infection °Urinary tract infections (UTIs) can develop anywhere along your urinary tract. Your urinary tract is your body's drainage system for removing wastes and extra water. Your urinary tract includes two kidneys, two ureters, a bladder, and a urethra. Your kidneys are a pair of bean-shaped organs. Each kidney is about the size of your fist. They are located below your ribs, one on each side of your spine. °CAUSES °Infections are caused by microbes, which are microscopic organisms, including fungi, viruses, and bacteria. These organisms are so small that they can only be seen through a microscope. Bacteria are the microbes that most commonly cause UTIs. °SYMPTOMS  °Symptoms of UTIs may vary by age and gender of the patient and by the location of the infection. Symptoms in young women typically include a frequent and intense urge to urinate and a painful, burning feeling in the bladder or urethra during urination. Older women and men are more likely to be tired, shaky, and weak and have muscle aches and abdominal pain. A fever may mean the infection is in your kidneys. Other symptoms of a kidney infection include pain in your back or sides below the ribs, nausea, and vomiting. °DIAGNOSIS °To diagnose a UTI, your caregiver will ask you about your symptoms. Your caregiver will also ask you to provide a urine sample. The   urine sample will be tested for bacteria and white blood cells. White blood cells are made by your body to help fight infection. °TREATMENT  °Typically, UTIs can be treated with medication. Because most UTIs are caused by a bacterial infection, they usually can be treated with the use of antibiotics. The choice of antibiotic and length of treatment depend on your symptoms and the type of bacteria causing your infection. °HOME CARE INSTRUCTIONS °· If you were prescribed  antibiotics, take them exactly as your caregiver instructs you. Finish the medication even if you feel better after you have only taken some of the medication. °· Drink enough water and fluids to keep your urine clear or pale yellow. °· Avoid caffeine, tea, and carbonated beverages. They tend to irritate your bladder. °· Empty your bladder often. Avoid holding urine for long periods of time. °· Empty your bladder before and after sexual intercourse. °· After a bowel movement, women should cleanse from front to back. Use each tissue only once. °SEEK MEDICAL CARE IF:  °· You have back pain. °· You develop a fever. °· Your symptoms do not begin to resolve within 3 days. °SEEK IMMEDIATE MEDICAL CARE IF:  °· You have severe back pain or lower abdominal pain. °· You develop chills. °· You have nausea or vomiting. °· You have continued burning or discomfort with urination. °MAKE SURE YOU:  °· Understand these instructions. °· Will watch your condition. °· Will get help right away if you are not doing well or get worse. °  °This information is not intended to replace advice given to you by your health care provider. Make sure you discuss any questions you have with your health care provider. °  °Document Released: 07/18/2005 Document Revised: 06/29/2015 Document Reviewed: 11/16/2011 °Elsevier Interactive Patient Education ©2016 Elsevier Inc. ° °

## 2015-08-09 NOTE — ED Notes (Signed)
Patient discharged to home per MD order. Patient in stable condition, and deemed medically cleared by ED provider for discharge. Discharge instructions reviewed with patient/family using "Teach Back"; verbalized understanding of medication education and administration, and information about follow-up care. Denies further concerns. ° °

## 2015-08-09 NOTE — ED Notes (Signed)
Pt to ct 

## 2015-10-07 ENCOUNTER — Encounter: Payer: Self-pay | Admitting: *Deleted

## 2015-10-07 ENCOUNTER — Emergency Department
Admission: EM | Admit: 2015-10-07 | Discharge: 2015-10-07 | Disposition: A | Discharge status: Home / Self Care | Payer: Medicare Other | Attending: Emergency Medicine | Admitting: Emergency Medicine

## 2015-10-07 DIAGNOSIS — S90851A Superficial foreign body, right foot, initial encounter: Secondary | ICD-10-CM | POA: Diagnosis not present

## 2015-10-07 DIAGNOSIS — Y998 Other external cause status: Secondary | ICD-10-CM | POA: Insufficient documentation

## 2015-10-07 DIAGNOSIS — Z79899 Other long term (current) drug therapy: Secondary | ICD-10-CM | POA: Insufficient documentation

## 2015-10-07 DIAGNOSIS — Z87891 Personal history of nicotine dependence: Secondary | ICD-10-CM | POA: Diagnosis not present

## 2015-10-07 DIAGNOSIS — E119 Type 2 diabetes mellitus without complications: Secondary | ICD-10-CM | POA: Diagnosis not present

## 2015-10-07 DIAGNOSIS — S99921A Unspecified injury of right foot, initial encounter: Secondary | ICD-10-CM | POA: Diagnosis present

## 2015-10-07 DIAGNOSIS — Y9289 Other specified places as the place of occurrence of the external cause: Secondary | ICD-10-CM | POA: Insufficient documentation

## 2015-10-07 DIAGNOSIS — Y280XXA Contact with sharp glass, undetermined intent, initial encounter: Secondary | ICD-10-CM | POA: Diagnosis not present

## 2015-10-07 DIAGNOSIS — Y9389 Activity, other specified: Secondary | ICD-10-CM | POA: Insufficient documentation

## 2015-10-07 DIAGNOSIS — Z794 Long term (current) use of insulin: Secondary | ICD-10-CM | POA: Diagnosis not present

## 2015-10-07 NOTE — ED Notes (Signed)
Right foot has something in it

## 2015-10-07 NOTE — Discharge Instructions (Signed)
Sliver Removal, Care After A sliver--also called a splinter--is a small and thin broken piece of an object that gets stuck (embedded) under the skin. A sliver can create a deep wound that can easily become infected. It is important to care for the wound after a sliver is removed to help prevent infection and other problems from developing. WHAT TO EXPECT AFTER THE PROCEDURE Slivers often break into smaller pieces when they are removed. If pieces of your sliver broke off and stayed in your skin, you will eventually see them working themselves out and you may feel some pain at the wound site. This is normal. HOME CARE INSTRUCTIONS  Keep all follow-up visits as directed by your health care provider. This is important.  There are many different ways to close and cover a wound, including stitches (sutures) and adhesive strips. Follow your health care provider's instructions about:  Wound care.  Bandage (dressing) changes and removal.  Wound closure removal.  Check the wound site every day for signs of infection. Watch for:  Red streaks coming from the wound.  Fever.  Redness or tenderness around the wound.  Fluid, blood, or pus coming from the wound.  A bad smell coming from the wound. SEEK MEDICAL CARE IF:  You think that a piece of the sliver is still in your skin.  Your wound was closed, as with sutures, and the edges of the wound break open.  You have signs of infection, including:  New or worsening redness around the wound.  New or worsening tenderness around the wound.  Fluid, blood, or pus coming from the wound.  A bad smell coming from the wound or dressing. SEEK IMMEDIATE MEDICAL CARE IF: You have any of the following signs of infection:  Red streaks coming from the wound.  An unexplained fever.   This information is not intended to replace advice given to you by your health care provider. Make sure you discuss any questions you have with your health care  provider.   Document Released: 10/05/2000 Document Revised: 10/29/2014 Document Reviewed: 06/10/2014 Elsevier Interactive Patient Education 2016 Elsevier Inc.  

## 2015-10-07 NOTE — ED Provider Notes (Signed)
[facility] Emergency Department Provider Note  ____________________________________________  Time seen: Approximately 12:42 PM  I have reviewed the triage vital signs and the nursing notes.   HISTORY  Chief Complaint Foot Pain   HPI Betty Luna is a 64 y.o. female presents for evaluation of soreness, right foot suggests today. Patient thinks that she may subsequently glass and got it stuck in her foot.   Past Medical History  Diagnosis Date  . CHF (congestive heart failure) ([place])   . Diabetes mellitus without complication ([place])   . Hypertension   . Multiple myeloma (H[facility])     There are no active problems to display for this patient.   History reviewed. No pertinent past surgical history.  Current Outpatient Rx  Name  Route  Sig  Dispense  Refill  . allopurinol (ZYLOPRIM) 300 MG tablet   Oral   Take 1 tablet by mouth daily.      5   . atorvastatin (LIPITOR) 20 MG tablet   Oral   Take 1 tablet by mouth daily.      11   . carvedilol (COREG) 25 MG tablet   Oral   Take 1 tablet by mouth 2 (two) times daily.      3   . colchicine 0.6 MG tablet   Oral   Take 1 tablet by mouth daily.      3   . Insulin Lispro Prot & Lispro (HUMALOG MIX 75/25 KWIKPEN) (75-25) 100 UNIT/ML Kwikpen   Subcutaneous   Inject 14 Units into the skin 2 (two) times daily before a meal.         . losartan (COZAAR) 50 MG tablet   Oral   Take 1 tablet by mouth daily.      2   . omeprazole (PRILOSEC) 40 MG capsule   Oral   Take 1 capsule by mouth daily.      4   . spironolactone (ALDACTONE) 25 MG tablet   Oral   Take 1 tablet by mouth daily.      3   . sucralfate (CARAFATE) 1 G tablet   Oral   Take 1 tablet (1 g total) by mouth 4 (four) times daily.   30 tablet   0   . torsemide (DEMADEX) 10 MG tablet   Oral   Take 1 tablet by mouth daily.      0     Allergies Codeine  No family history on file.  Social History Social History   Substance Use Topics  . Smoking status: Former Research scientist (life sciences)  . Smokeless tobacco: None  . Alcohol Use: Yes     Comment: occasonally    Review of Systems Constitutional: No fever/chills Eyes: No visual changes. ENT: No sore throat. Cardiovascular: Denies chest pain. Respiratory: Denies shortness of breath. Gastrointestinal: No abdominal pain.  No nausea, no vomiting.  No diarrhea.  No constipation. Genitourinary: Negative for dysuria. Musculoskeletal: Negative for back pain. Skin: Positive for foreign body piece of glass on the plantar aspect of right foot. Neurological: Negative for headaches, focal weakness or numbness.  10-point ROS otherwise negative.  ____________________________________________   PHYSICAL EXAM:  VITAL SIGNS: ED Triage Vitals  Enc Vitals Group     BP 10/07/15 1201 186/95 mmHg     Pulse Rate 10/07/15 1201 110     Resp 10/07/15 1201 18     Temp 10/07/15 1201 98.8 F (37.1 C)     Temp Source 10/07/15 1201 Oral  SpO2 10/07/15 1201 100 %     Weight 10/07/15 1201 233 lb (105.688 kg)     Height 10/07/15 1201 '5\' 3"'  (1.6 m)     Head Cir --      Peak Flow --      Pain Score --      Pain Loc --      Pain Edu? --      Excl. in [place]? --     Constitutional: Alert and oriented. Well appearing and in no acute distress.   Cardiovascular: Normal rate, regular rhythm. Grossly normal heart sounds.  Good peripheral circulation. Respiratory: Normal respiratory effort.  No retractions. Lungs CTAB. Gastrointestinal: Soft and nontender. No distention. No abdominal bruits. No CVA tenderness. Neurologic:  Normal speech and language. No gross focal neurologic deficits are appreciated. No gait instability. Skin:  Skin is warm, dry and intact. Positive foreign body, piece of glass noted to be embedded in the plantar aspect of the right foot. Psychiatric: Mood and affect are normal. Speech and behavior are normal.  ____________________________________________   LABS (all  labs ordered are listed, but only abnormal results are displayed)  Labs Reviewed - No data to display ____________________________________________   PROCEDURES  Procedure(s) performed:yes  Foreign body removed with splinter forceps. Patient tolerated procedure well.  Critical Care performed: No  ____________________________________________   INITIAL IMPRESSION / ASSESSMENT AND PLAN / ED COURSE  Pertinent labs & imaging results that were available during my care of the patient were reviewed by me and considered in my medical decision making (see chart for details).  Foreign body in right foot removed. Reassurance provided patient noted Biaxin at this time. Soaks and follow-up as needed. ____________________________________________   FINAL CLINICAL IMPRESSION(S) / ED DIAGNOSES  Final diagnoses:  Foreign body in foot, right, initial encounter      Arlyss Repress, PA-C 10/07/15 1321  Eula Listen, MD 10/07/15 1521

## 2015-10-07 NOTE — ED Notes (Signed)
Pt in w/ complaints of soreness to bottom of right foot since yesterday; states she thinks a piece of glass is in it.

## 2015-11-25 ENCOUNTER — Other Ambulatory Visit: Payer: Self-pay | Admitting: Internal Medicine

## 2015-11-25 DIAGNOSIS — Z1231 Encounter for screening mammogram for malignant neoplasm of breast: Secondary | ICD-10-CM

## 2015-11-29 ENCOUNTER — Other Ambulatory Visit: Payer: Self-pay | Admitting: Internal Medicine

## 2015-11-29 ENCOUNTER — Ambulatory Visit
Admission: RE | Admit: 2015-11-29 | Discharge: 2015-11-29 | Disposition: A | Discharge status: Home / Self Care | Payer: Medicare Other | Source: Ambulatory Visit | Attending: Internal Medicine | Admitting: Internal Medicine

## 2015-11-29 DIAGNOSIS — Z1231 Encounter for screening mammogram for malignant neoplasm of breast: Secondary | ICD-10-CM | POA: Diagnosis present

## 2016-05-28 ENCOUNTER — Emergency Department: Payer: Medicare Other

## 2016-05-28 ENCOUNTER — Emergency Department
Admission: EM | Admit: 2016-05-28 | Discharge: 2016-05-28 | Disposition: A | Discharge status: Home / Self Care | Payer: Medicare Other | Attending: Student in an Organized Health Care Education/Training Program | Admitting: Student in an Organized Health Care Education/Training Program

## 2016-05-28 DIAGNOSIS — Z794 Long term (current) use of insulin: Secondary | ICD-10-CM | POA: Diagnosis not present

## 2016-05-28 DIAGNOSIS — E119 Type 2 diabetes mellitus without complications: Secondary | ICD-10-CM | POA: Insufficient documentation

## 2016-05-28 DIAGNOSIS — I509 Heart failure, unspecified: Secondary | ICD-10-CM | POA: Diagnosis not present

## 2016-05-28 DIAGNOSIS — Z87891 Personal history of nicotine dependence: Secondary | ICD-10-CM | POA: Diagnosis not present

## 2016-05-28 DIAGNOSIS — Z79899 Other long term (current) drug therapy: Secondary | ICD-10-CM | POA: Diagnosis not present

## 2016-05-28 DIAGNOSIS — I110 Hypertensive heart disease with heart failure: Secondary | ICD-10-CM | POA: Diagnosis not present

## 2016-05-28 DIAGNOSIS — R55 Syncope and collapse: Secondary | ICD-10-CM | POA: Diagnosis not present

## 2016-05-28 LAB — COMPREHENSIVE METABOLIC PANEL
ALK PHOS: 98 U/L (ref 38–126)
ALT: 19 U/L (ref 14–54)
ANION GAP: 7 (ref 5–15)
AST: 21 U/L (ref 15–41)
Albumin: 3.5 g/dL (ref 3.5–5.0)
BILIRUBIN TOTAL: 0.4 mg/dL (ref 0.3–1.2)
BUN: 17 mg/dL (ref 6–20)
CALCIUM: 9.1 mg/dL (ref 8.9–10.3)
CO2: 26 mmol/L (ref 22–32)
CREATININE: 0.73 mg/dL (ref 0.44–1.00)
Chloride: 102 mmol/L (ref 101–111)
GFR calc non Af Amer: >60 mL/min (ref >60)
GLUCOSE: 281 mg/dL — AB (ref 65–99)
Potassium: 3.5 mmol/L (ref 3.5–5.1)
Sodium: 135 mmol/L (ref 135–145)
TOTAL PROTEIN: 7.1 g/dL (ref 6.5–8.1)

## 2016-05-28 LAB — CBC
HCT: 34.4 % — ABNORMAL LOW (ref 35.0–47.0)
HEMOGLOBIN: 12 g/dL (ref 12.0–16.0)
MCH: 24.5 pg — AB (ref 26.0–34.0)
MCHC: 34.9 g/dL (ref 32.0–36.0)
MCV: 70.1 fL — AB (ref 80.0–100.0)
Platelets: 218 10*3/uL (ref 150–440)
RBC: 4.9 MIL/uL (ref 3.80–5.20)
RDW: 15.4 % — ABNORMAL HIGH (ref 11.5–14.5)
WBC: 8.6 10*3/uL (ref 3.6–11.0)

## 2016-05-28 LAB — TROPONIN I
Troponin I: <0.03 ng/mL (ref <0.03)
Troponin I: <0.03 ng/mL (ref <0.03)

## 2016-05-28 NOTE — ED Triage Notes (Signed)
Pt from home via EMS, reports near syncope, states she didn't feel right and her vision began to go black but did not lose consciousness. Reports her R. Hand went numb and feet began tingling

## 2016-05-28 NOTE — ED Notes (Signed)
Patient transported to X-ray 

## 2016-05-28 NOTE — ED Provider Notes (Signed)
[facility] Emergency Department Provider Note    First MD Initiated Contact with Patient 05/28/16 1606     (approximate)  I have reviewed the triage vital signs and the nursing notes.   HISTORY  Chief Complaint Near Syncope    HPI Betty Luna is a 65 y.o. female presents with near syncopal episode in the setting of known congestive heart failure, diabetes and hypertension. Patient states that she was sitting in bed today not rapidly rising from a lying position felt anxious with some midsternal chest pressure that she then became lightheaded and nearly passed out. She did not fully lose consciousness. Really after the episode which lasted roughly 15 seconds she states that she became tearful.  She's never had these symptoms before.  She currently feels asymptomatic. She denies any headaches but states that earlier today when she had the chest pain she did have some right upper extremity numbness and tingling. She's never had any known cardiac disease but [facility] test the previously. She denies any lower extremity edema. No nausea or vomiting.   Past Medical History:  Diagnosis Date  . CHF (congestive heart failure) (H[facility])   . Diabetes mellitus without complication ([facility])   . Hypertension     There are no active problems to display for this patient.   History reviewed. No pertinent surgical history.  Prior to Admission medications   Medication Sig Start Date End Date Taking? Authorizing Provider  allopurinol (ZYLOPRIM) 300 MG tablet Take 1 tablet by mouth daily. 03/22/15   Historical Provider, MD  atorvastatin (LIPITOR) 20 MG tablet Take 1 tablet by mouth daily. 03/22/15   Historical Provider, MD  carvedilol (COREG) 25 MG tablet Take 1 tablet by mouth 2 (two) times daily. 02/14/15   Historical Provider, MD  colchicine 0.6 MG tablet Take 1 tablet by mouth daily. 03/22/15   Historical Provider, MD    Insulin Lispro Prot & Lispro (HUMALOG MIX 75/25 KWIKPEN) (75-25) 100 UNIT/ML Kwikpen Inject 14 Units into the skin 2 (two) times daily before a meal.    Historical Provider, MD  losartan (COZAAR) 50 MG tablet Take 1 tablet by mouth daily. 01/23/15   Historical Provider, MD  omeprazole (PRILOSEC) 40 MG capsule Take 1 capsule by mouth daily. 03/02/15   Historical Provider, MD  spironolactone (ALDACTONE) 25 MG tablet Take 1 tablet by mouth daily. 02/21/15   Historical Provider, MD  sucralfate (CARAFATE) 1 G tablet Take 1 tablet (1 g total) by mouth 4 (four) times daily. 03/28/15 03/27/16  Rebecka Apley, MD  torsemide (DEMADEX) 10 MG tablet Take 1 tablet by mouth daily. 03/23/15   Historical Provider, MD    Allergies Codeine  No family history on file.  Social History Social History  Substance Use Topics  . Smoking status: Former Games developer  . Smokeless tobacco: Not on file  . Alcohol use Yes     Comment: occasonally    Review of Systems Patient denies headaches, rhinorrhea, blurry vision, numbness, shortness of breath, chest pain, edema, cough, abdominal pain, nausea, vomiting, diarrhea, dysuria, fevers, rashes or hallucinations unless otherwise stated above in HPI. ____________________________________________   PHYSICAL EXAM:  VITAL SIGNS: Vitals:   05/28/16 1900 05/28/16 1902  BP: (!) 113/52   Pulse: 78 82  Resp: 18 13  Temp:      Constitutional: Alert and oriented. Well appearing and in no acute distress. Eyes: Conjunctivae are normal. PERRL. EOMI. Head: Atraumatic. Nose: No  congestion/rhinnorhea. Mouth/Throat: Mucous membranes are moist.  Oropharynx non-erythematous. Neck: No stridor. Painless ROM. No cervical spine tenderness to palpation Hematological/Lymphatic/Immunilogical: No cervical lymphadenopathy. Cardiovascular: Normal rate, regular rhythm. Grossly normal heart sounds.  Good peripheral circulation. Respiratory: Normal respiratory effort.  No retractions. Lungs  CTAB. Gastrointestinal: Soft and nontender. No distention. No abdominal bruits. No CVA tenderness. Genitourinary:  Musculoskeletal: No lower extremity tenderness nor edema.  No joint effusions. Neurologic:  Normal speech and language. No gross focal neurologic deficits are appreciated. No gait instability. Skin:  Skin is warm, dry and intact. No rash noted. Psychiatric: Mood and affect are normal. Speech and behavior are normal.  ____________________________________________   LABS (all labs ordered are listed, but only abnormal results are displayed)  Results for orders placed or performed during the hospital encounter of 05/28/16 (from the past 24 hour(s))  CBC     Status: Abnormal   Collection Time: 05/28/16  4:35 PM  Result Value Ref Range   WBC 8.6 3.6 - 11.0 K/uL   RBC 4.90 3.80 - 5.20 MIL/uL   Hemoglobin 12.0 12.0 - 16.0 g/dL   HCT 03.7 (L) 09.6 - 43.8 %   MCV 70.1 (L) 80.0 - 100.0 fL   MCH 24.5 (L) 26.0 - 34.0 pg   MCHC 34.9 32.0 - 36.0 g/dL   RDW 38.1 (H) 84.0 - 37.5 %   Platelets 218 150 - 440 K/uL  Comprehensive metabolic panel     Status: Abnormal   Collection Time: 05/28/16  4:35 PM  Result Value Ref Range   Sodium 135 135 - 145 mmol/L   Potassium 3.5 3.5 - 5.1 mmol/L   Chloride 102 101 - 111 mmol/L   CO2 26 22 - 32 mmol/L   Glucose, Bld 281 (H) 65 - 99 mg/dL   BUN 17 6 - 20 mg/dL   Creatinine, Ser 4.36 0.44 - 1.00 mg/dL   Calcium 9.1 8.9 - 06.7 mg/dL   Total Protein 7.1 6.5 - 8.1 g/dL   Albumin 3.5 3.5 - 5.0 g/dL   AST 21 15 - 41 U/L   ALT 19 14 - 54 U/L   Alkaline Phosphatase 98 38 - 126 U/L   Total Bilirubin 0.4 0.3 - 1.2 mg/dL   GFR calc non Af Amer >60 >60 mL/min   GFR calc Af Amer >60 >60 mL/min   Anion gap 7 5 - 15  Troponin I     Status: None   Collection Time: 05/28/16  4:35 PM  Result Value Ref Range   Troponin I <0.03 <0.03 ng/mL  Troponin I     Status: None   Collection Time: 05/28/16  6:17 PM  Result Value Ref Range   Troponin I <0.03  <0.03 ng/mL   ____________________________________________  EKG  Time: 16:11  Indication: syncope  Rate: 90  Rhythm: NSR Axis: Nonspecific ST changes no signs of dysrhythmia or preexcitation syndrome.  ____________________________________________  RADIOLOGY  CXR my read shows no evidence of acute cardiopulmonary process.  ____________________________________________   PROCEDURES  Procedure(s) performed: none    Critical Care performed: no ____________________________________________   INITIAL IMPRESSION / ASSESSMENT AND PLAN / ED COURSE  Pertinent labs & imaging results that were available during my care of the patient were reviewed by me and considered in my medical decision making (see chart for details).  DDX: dysrhythmia, dehydration, anxiety, seizure, heart failure,  Chayna Pick is a 65 y.o. who presents to the ED with near syncopal episode this morning. She does have a history  of diabetes and is on an salon and by mouth medications. He arrives to ED afebrile and hemodynamic stable. She is currently pleasant and without focal neuro deficits. History is not clinically consistent with seizure activity. Less consistent with ACS and she did not fully syncopized and had brief episode of chest pain. We'll check EKG and troponin given her risk factor of diabetes. Will check chest x-ray due to concern for congestive heart failure. We'll check electrolytes to be concerning for electrolyte abnormality.    Clinical Course   ----------------------------------------- 5:45 PM on 05/28/2016 -----------------------------------------  Reassessed patient, states that she is currently asymptomatic and has been since being in the ED. Repeat neuro exam is nonfocal. She did have a brief episode of chest pain during this episode I will repeat EKG and troponin to further risk stratify.  6:20PM  Repeat EKG and troponin are negative for acute ischemia or dysrhythmia. Based on testing and  history do not feel this clinically consistent with primary cardiac symptoms. Do not feel this is clinically consistent with ACS. Patient was able to ambulate with a steady gait was asymptomatic. She was able to tolerate Po.  Repeat neuro exam was nonfocal. The patient stable and appropriate for follow-up with PCP.  Have discussed with the patient and available family all diagnostics and treatments performed thus far and all questions were answered to the best of my ability. The patient demonstrates understanding and agreement with plan.   ___________________________________________   FINAL CLINICAL IMPRESSION(S) / ED DIAGNOSES  Final diagnoses:  Near syncope      NEW MEDICATIONS STARTED DURING THIS VISIT:  Discharge Medication List as of 05/28/2016  7:01 PM       Note:  This document was prepared using Dragon voice recognition software and may include unintentional dictation errors.    Willy Eddy, MD 05/28/16 2200

## 2016-05-29 DIAGNOSIS — I700 Atherosclerosis of aorta: Secondary | ICD-10-CM | POA: Insufficient documentation

## 2016-06-01 ENCOUNTER — Encounter: Payer: Self-pay | Admitting: Emergency Medicine

## 2016-06-01 ENCOUNTER — Emergency Department: Payer: Medicare Other

## 2016-06-01 ENCOUNTER — Emergency Department
Admission: EM | Admit: 2016-06-01 | Discharge: 2016-06-01 | Disposition: A | Discharge status: Home / Self Care | Payer: Medicare Other | Attending: Emergency Medicine | Admitting: Emergency Medicine

## 2016-06-01 DIAGNOSIS — Z794 Long term (current) use of insulin: Secondary | ICD-10-CM | POA: Insufficient documentation

## 2016-06-01 DIAGNOSIS — Z79899 Other long term (current) drug therapy: Secondary | ICD-10-CM | POA: Diagnosis not present

## 2016-06-01 DIAGNOSIS — I509 Heart failure, unspecified: Secondary | ICD-10-CM | POA: Insufficient documentation

## 2016-06-01 DIAGNOSIS — I110 Hypertensive heart disease with heart failure: Secondary | ICD-10-CM | POA: Diagnosis not present

## 2016-06-01 DIAGNOSIS — E119 Type 2 diabetes mellitus without complications: Secondary | ICD-10-CM | POA: Insufficient documentation

## 2016-06-01 DIAGNOSIS — R079 Chest pain, unspecified: Secondary | ICD-10-CM | POA: Diagnosis not present

## 2016-06-01 DIAGNOSIS — Z87891 Personal history of nicotine dependence: Secondary | ICD-10-CM | POA: Diagnosis not present

## 2016-06-01 LAB — BASIC METABOLIC PANEL
ANION GAP: 9 (ref 5–15)
BUN: 15 mg/dL (ref 6–20)
CALCIUM: 9.5 mg/dL (ref 8.9–10.3)
CO2: 28 mmol/L (ref 22–32)
Chloride: 98 mmol/L — ABNORMAL LOW (ref 101–111)
Creatinine, Ser: 0.73 mg/dL (ref 0.44–1.00)
GFR calc non Af Amer: >60 mL/min (ref >60)
Glucose, Bld: 309 mg/dL — ABNORMAL HIGH (ref 65–99)
Potassium: 3.8 mmol/L (ref 3.5–5.1)
Sodium: 135 mmol/L (ref 135–145)

## 2016-06-01 LAB — CBC
HCT: 41.2 % (ref 35.0–47.0)
HEMOGLOBIN: 13.5 g/dL (ref 12.0–16.0)
MCH: 23.6 pg — AB (ref 26.0–34.0)
MCHC: 32.7 g/dL (ref 32.0–36.0)
MCV: 72.2 fL — ABNORMAL LOW (ref 80.0–100.0)
Platelets: 251 10*3/uL (ref 150–440)
RBC: 5.71 MIL/uL — AB (ref 3.80–5.20)
RDW: 15.5 % — ABNORMAL HIGH (ref 11.5–14.5)
WBC: 9.6 10*3/uL (ref 3.6–11.0)

## 2016-06-01 LAB — TROPONIN I: Troponin I: <0.03 ng/mL (ref <0.03)

## 2016-06-01 LAB — BRAIN NATRIURETIC PEPTIDE: B NATRIURETIC PEPTIDE 5: 19 pg/mL (ref 0.0–100.0)

## 2016-06-01 MED ORDER — ASPIRIN 81 MG PO CHEW
324.0000 mg | CHEWABLE_TABLET | Freq: Once | ORAL | Status: AC
  Administered 2016-06-01: 324 mg via ORAL
  Filled 2016-06-01: qty 4

## 2016-06-01 NOTE — ED Notes (Signed)
Verified BNP add on with lab

## 2016-06-01 NOTE — ED Notes (Signed)
Attempted to call lab x2 to verify that they could add on BNP - will attempt again

## 2016-06-01 NOTE — ED Provider Notes (Signed)
[facility] Emergency Department Provider Note  ____________________________________________  Time seen: Approximately 2:51 AM  I have reviewed the triage vital signs and the nursing notes.   HISTORY  Chief Complaint Chest Pain   HPI Betty Luna is a 65 y.o. female the history of CHF, diabetes, and hypertension who presents for evaluation of chest pain. Patient reports she has been having intermittent chest pain for the course of the week. She was seen here 4 days ago with a near syncopal episode with normal workup and was discharged home. She followed up with her primary care doctor and was evaluated by Dr. Lady Gary on 05/29/16. She was put on a cardiac Holter monitor for 2 days which she turned into his office yesterday. She still does not have results of this cardiac monitor. She denies having CP while on the monitor. She reports she had 1 stress test in 2016 that she was unable to complete due to hip pain. Dr. Lady Gary discussed with her on this appointment that he would get her a repeat stress test. She reports that her episodes of chest pain as an ache, been happening multiple times a day, both at rest and with exertion, they're located in multiple different places in her chest, they usually last a few minutes at a time, they're not associated with shortness of breath, dizziness, nausea, vomiting. Patient denies any personal or family history of ischemic heart disease however she does not know her maternal and paternal histories. She had an episode of pain this evening while laying bed which prompted her to come to the emergency department. She reports she is pain-free at this time. She denies cough, fever, abdominal pain. She is a former smoker and stopped 10 years ago.  Past Medical History:  Diagnosis Date  . CHF (congestive heart failure) (HCC)   . Diabetes mellitus without complication (HCC)   . Hypertension     There are no active problems to display for  this patient.   History reviewed. No pertinent surgical history.  Prior to Admission medications   Medication Sig Start Date End Date Taking? Authorizing Provider  allopurinol (ZYLOPRIM) 300 MG tablet Take 1 tablet by mouth daily. 03/22/15   Historical Provider, MD  atorvastatin (LIPITOR) 20 MG tablet Take 1 tablet by mouth daily. 03/22/15   Historical Provider, MD  carvedilol (COREG) 25 MG tablet Take 1 tablet by mouth 2 (two) times daily. 02/14/15   Historical Provider, MD  colchicine 0.6 MG tablet Take 1 tablet by mouth daily. 03/22/15   Historical Provider, MD  Insulin Lispro Prot & Lispro (HUMALOG MIX 75/25 KWIKPEN) (75-25) 100 UNIT/ML Kwikpen Inject 14 Units into the skin 2 (two) times daily before a meal.    Historical Provider, MD  losartan (COZAAR) 50 MG tablet Take 1 tablet by mouth daily. 01/23/15   Historical Provider, MD  omeprazole (PRILOSEC) 40 MG capsule Take 1 capsule by mouth daily. 03/02/15   Historical Provider, MD  spironolactone (ALDACTONE) 25 MG tablet Take 1 tablet by mouth daily. 02/21/15   Historical Provider, MD  sucralfate (CARAFATE) 1 G tablet Take 1 tablet (1 g total) by mouth 4 (four) times daily. 03/28/15 03/27/16  Rebecka Apley, MD  torsemide (DEMADEX) 10 MG tablet Take 1 tablet by mouth daily. 03/23/15   Historical Provider, MD    Allergies Codeine  No family history on file.  Social History Social History  Substance Use Topics  . Smoking status: Former Games developer  . Smokeless tobacco: Never Used  .  Alcohol use Yes     Comment: occasonally    Review of Systems  Constitutional: Negative for fever. Eyes: Negative for visual changes. ENT: Negative for sore throat. Cardiovascular: + chest pain. Respiratory: Negative for shortness of breath. Gastrointestinal: Negative for abdominal pain, vomiting or diarrhea. Genitourinary: Negative for dysuria. Musculoskeletal: Negative for back pain. Skin: Negative for rash. Neurological: Negative for headaches, weakness or  numbness.  ____________________________________________   PHYSICAL EXAM:  VITAL SIGNS: ED Triage Vitals  Enc Vitals Group     BP 06/01/16 0050 (!) 175/87     Pulse Rate 06/01/16 0050 89     Resp 06/01/16 0050 20     Temp 06/01/16 0050 98 F (36.7 C)     Temp Source 06/01/16 0050 Oral     SpO2 06/01/16 0050 97 %     Weight 06/01/16 0043 232 lb (105.2 kg)     Height 06/01/16 0043 5\' 2"  (1.575 m)     Head Circumference --      Peak Flow --      Pain Score 06/01/16 0043 8     Pain Loc --      Pain Edu? --      Excl. in GC? --     Constitutional: Alert and oriented. Well appearing and in no apparent distress. HEENT:      Head: Normocephalic and atraumatic.         Eyes: Conjunctivae are normal. Sclera is non-icteric. EOMI. PERRL      Mouth/Throat: Mucous membranes are moist.       Neck: Supple with no signs of meningismus. Cardiovascular: Regular rate and rhythm. No murmurs, gallops, or rubs. 2+ symmetrical distal pulses are present in all extremities. No JVD. Respiratory: Normal respiratory effort. Lungs are clear to auscultation bilaterally. No wheezes, crackles, or rhonchi.  Gastrointestinal: Soft, non tender, and non distended with positive bowel sounds. No rebound or guarding. Genitourinary: No CVA tenderness. Musculoskeletal: Nontender with normal range of motion in all extremities. No edema, cyanosis, or erythema of extremities. Neurologic: Normal speech and language. Face is symmetric. Moving all extremities. No gross focal neurologic deficits are appreciated. Skin: Skin is warm, dry and intact. No rash noted. Psychiatric: Mood and affect are normal. Speech and behavior are normal.  ____________________________________________   LABS (all labs ordered are listed, but only abnormal results are displayed)  Labs Reviewed  BASIC METABOLIC PANEL - Abnormal; Notable for the following:       Result Value   Chloride 98 (*)    Glucose, Bld 309 (*)    All other components  within normal limits  CBC - Abnormal; Notable for the following:    RBC 5.71 (*)    MCV 72.2 (*)    MCH 23.6 (*)    RDW 15.5 (*)    All other components within normal limits  TROPONIN I  BRAIN NATRIURETIC PEPTIDE  TROPONIN I   ____________________________________________  EKG  ED ECG REPORT I, Nita Sickle, the attending physician, personally viewed and interpreted this ECG.  Sinus rhythm, rate of 84, normal intervals, normal axis, no ST elevations or depressions. ____________________________________________  RADIOLOGY  CXR: Negative ____________________________________________   PROCEDURES  Procedure(s) performed: None Procedures Critical Care performed:  None ____________________________________________   INITIAL IMPRESSION / ASSESSMENT AND PLAN / ED COURSE   65 y.o. female the history of CHF, diabetes, and hypertension who presents for evaluation of chest pain. Patient has been having multiple episodes over the course of the last week. These episodes  do not sound like ACS as she reports that the pain starts in different places in her chest and migrate to her shoulders and back during each episode. They're not worse with exertion. EKG with no evidence of ischemia. Patient heart score of 3. She is being followed by Dr. Lady Gary. Will get two sets of troponin q 3 hours and observe patient in telemetry.  Clinical Course  Comment By Time  Patient remained stable with no further episodes of chest pain. Workup here negative with 2 troponins every 3 hours. I sent an email to Dr. Lady Gary for close follow up. I discussed return precautions and close follow-up with patient was comfortable with this plan. We'll discharge her at this time. Nita Sickle, MD 08/11 [phone]    Pertinent labs & imaging results that were available during my care of the patient were reviewed by me and considered in my medical decision making (see chart for  details).    ____________________________________________   FINAL CLINICAL IMPRESSION(S) / ED DIAGNOSES  Final diagnoses:  Chest pain, unspecified chest pain type      NEW MEDICATIONS STARTED DURING THIS VISIT:  New Prescriptions   No medications on file     Note:  This document was prepared using Dragon voice recognition software and may include unintentional dictation errors.    Nita Sickle, MD 06/01/16 [phone]

## 2016-06-01 NOTE — Discharge Instructions (Signed)

## 2016-06-01 NOTE — ED Notes (Signed)
Pt reports she is having chest pains since Tuesday - she was seen in the er Tuesday for "fainting", Wed she was seen by the cardiologist and wore a holter monitor Wed and Thurs - she states the chest pain initially started 2 weeks ago - Pt denies nausea/vomiting - pt reports that she gets real nervous and becomes anxious and then has pain all over that moves around

## 2016-06-01 NOTE — ED Triage Notes (Signed)
Patient ambulatory to triage with steady gait, without difficulty or distress noted; pt reports left sided CP radiating into back, worse when lying supine; st was seen here for same recently & f/u cardiologist for holter monitor and removed today

## 2016-06-02 ENCOUNTER — Emergency Department
Admission: EM | Admit: 2016-06-02 | Discharge: 2016-06-02 | Disposition: A | Discharge status: Home / Self Care | Payer: Medicare Other | Attending: Student | Admitting: Student

## 2016-06-02 ENCOUNTER — Encounter: Payer: Self-pay | Admitting: *Deleted

## 2016-06-02 DIAGNOSIS — Z794 Long term (current) use of insulin: Secondary | ICD-10-CM | POA: Diagnosis not present

## 2016-06-02 DIAGNOSIS — I509 Heart failure, unspecified: Secondary | ICD-10-CM | POA: Insufficient documentation

## 2016-06-02 DIAGNOSIS — Z79899 Other long term (current) drug therapy: Secondary | ICD-10-CM | POA: Insufficient documentation

## 2016-06-02 DIAGNOSIS — E119 Type 2 diabetes mellitus without complications: Secondary | ICD-10-CM | POA: Diagnosis not present

## 2016-06-02 DIAGNOSIS — I110 Hypertensive heart disease with heart failure: Secondary | ICD-10-CM | POA: Insufficient documentation

## 2016-06-02 DIAGNOSIS — Z87891 Personal history of nicotine dependence: Secondary | ICD-10-CM | POA: Insufficient documentation

## 2016-06-02 DIAGNOSIS — F419 Anxiety disorder, unspecified: Secondary | ICD-10-CM

## 2016-06-02 LAB — URINALYSIS COMPLETE WITH MICROSCOPIC (ARMC ONLY)
Bilirubin Urine: NEGATIVE
Glucose, UA: >500 mg/dL — AB
HGB URINE DIPSTICK: NEGATIVE
Ketones, ur: NEGATIVE mg/dL
LEUKOCYTES UA: NEGATIVE
NITRITE: NEGATIVE
PH: 5 (ref 5.0–8.0)
Protein, ur: NEGATIVE mg/dL
RBC / HPF: NONE SEEN RBC/hpf (ref 0–5)
SPECIFIC GRAVITY, URINE: 1.008 (ref 1.005–1.030)

## 2016-06-02 LAB — TSH: TSH: 0.837 u[IU]/mL (ref 0.350–4.500)

## 2016-06-02 LAB — T4, FREE: Free T4: 1.13 ng/dL — ABNORMAL HIGH (ref 0.61–1.12)

## 2016-06-02 MED ORDER — LORAZEPAM 0.5 MG PO TABS
0.5000 mg | ORAL_TABLET | Freq: Once | ORAL | Status: AC
  Administered 2016-06-02: 0.5 mg via ORAL
  Filled 2016-06-02: qty 1

## 2016-06-02 NOTE — ED Triage Notes (Signed)
Pt reports feeling anxious, pt denies chest pian and any other symptoms

## 2016-06-02 NOTE — ED Provider Notes (Signed)
[facility] Emergency Department Provider Note   ____________________________________________   First MD Initiated Contact with Patient 06/02/16 1507     (approximate)  I have reviewed the triage vital signs and the nursing notes.   HISTORY  Chief Complaint Anxiety    HPI Betty Luna is a 65 y.o. female with history of  CHF, diabetes, hypertension who presents for evaluation of one week of intermittent anxiety and feeling "jittery" gradual onset, constant, moderate to severe, no modifying factors. She was seen here on 05/29/2016 for an episode of near syncope as well as chest pain, her workup was reassuring, she was seen by Dr. Ihor Austin in clinic 4 days ago and had a Holter monitor test performed. She will return yesterday in the wee hours of the morning for continued chest pain however her workup was reassuring including 2 sets of negative troponins and she was discharged home. She has not had any recurrent chest pain, no difficulty breathing, no syncope or near-syncope since she was last seen in the emergency department. Her primary complaint is of feeling "jittery" and overwhelmingly anxious. She denies any new symptoms including no vomiting, diarrhea, fevers or chills. No severe headache, no numbness or weakness. She is concerned about her thyroid because she reports that she has had "thyroid problems" previously.   Past Medical History:  Diagnosis Date  . CHF (congestive heart failure) (HCC)   . Diabetes mellitus without complication (HCC)   . Hypertension     There are no active problems to display for this patient.   History reviewed. No pertinent surgical history.  Prior to Admission medications   Medication Sig Start Date End Date Taking? Authorizing Provider  allopurinol (ZYLOPRIM) 300 MG tablet Take 1 tablet by mouth daily. 03/22/15   Historical Provider, MD  atorvastatin (LIPITOR) 20 MG tablet Take 1 tablet by mouth daily. 03/22/15   Historical  Provider, MD  carvedilol (COREG) 25 MG tablet Take 1 tablet by mouth 2 (two) times daily. 02/14/15   Historical Provider, MD  colchicine 0.6 MG tablet Take 1 tablet by mouth daily. 03/22/15   Historical Provider, MD  Insulin Lispro Prot & Lispro (HUMALOG MIX 75/25 KWIKPEN) (75-25) 100 UNIT/ML Kwikpen Inject 14 Units into the skin 2 (two) times daily before a meal.    Historical Provider, MD  losartan (COZAAR) 50 MG tablet Take 1 tablet by mouth daily. 01/23/15   Historical Provider, MD  omeprazole (PRILOSEC) 40 MG capsule Take 1 capsule by mouth daily. 03/02/15   Historical Provider, MD  spironolactone (ALDACTONE) 25 MG tablet Take 1 tablet by mouth daily. 02/21/15   Historical Provider, MD  sucralfate (CARAFATE) 1 G tablet Take 1 tablet (1 g total) by mouth 4 (four) times daily. 03/28/15 03/27/16  Rebecka Apley, MD  torsemide (DEMADEX) 10 MG tablet Take 1 tablet by mouth daily. 03/23/15   Historical Provider, MD    Allergies Codeine  No family history on file.  Social History Social History  Substance Use Topics  . Smoking status: Former Games developer  . Smokeless tobacco: Never Used  . Alcohol use Yes     Comment: occasonally    Review of Systems Constitutional: No fever/chills Eyes: No visual changes. ENT: No sore throat. Cardiovascular: Denies chest pain. Respiratory: Denies shortness of breath. Gastrointestinal: No abdominal pain.  No nausea, no vomiting.  No diarrhea.  No constipation. Genitourinary: Negative for dysuria. Musculoskeletal: Negative for back pain. Skin: Negative for rash. Neurological: Negative for headaches, focal weakness or numbness.  10-point ROS otherwise negative.  ____________________________________________   PHYSICAL EXAM:  Vitals:   06/02/16 1530 06/02/16 1600 06/02/16 1630 06/02/16 1700  BP: (!) 101/58 107/64 133/82 121/69  Pulse: 84 87 87 83  Resp: (!) 21 13 (!) 22 15  Temp:      TempSrc:      SpO2: (!) 88% 96% 100% 94%  Weight:      Height:          VITAL SIGNS: ED Triage Vitals  Enc Vitals Group     BP 06/02/16 1305 (!) 141/74     Pulse Rate 06/02/16 1305 90     Resp 06/02/16 1305 20     Temp 06/02/16 1305 97.8 F (36.6 C)     Temp Source 06/02/16 1305 Oral     SpO2 06/02/16 1305 97 %     Weight 06/02/16 1306 231 lb (104.8 kg)     Height 06/02/16 1306 5\' 2"  (1.575 m)     Head Circumference --      Peak Flow --      Pain Score --      Pain Loc --      Pain Edu? --      Excl. in GC? --     Constitutional: Alert and oriented. Well appearing and in no acute distress. Eyes: Conjunctivae are normal. PERRL. EOMI. Head: Atraumatic. Nose: No congestion/rhinnorhea. Mouth/Throat: Mucous membranes are moist.  Oropharynx non-erythematous. Neck: No stridor.  Cardiovascular: Normal rate, regular rhythm. Grossly normal heart sounds.  Good peripheral circulation. Respiratory: Normal respiratory effort.  No retractions. Lungs CTAB. Gastrointestinal: Soft and nontender. No distention.  No CVA tenderness. Genitourinary: deferred Musculoskeletal: No lower extremity tenderness nor edema.  No joint effusions. Neurologic:  Normal speech and language. No gross focal neurologic deficits are appreciated. No gait instability. Skin:  Skin is warm, dry and intact. No rash noted. Psychiatric: Mood and affect are normal. Speech and behavior are normal.  ____________________________________________   LABS (all labs ordered are listed, but only abnormal results are displayed)  Labs Reviewed  T4, FREE - Abnormal; Notable for the following:       Result Value   Free T4 1.13 (*)    All other components within normal limits  URINALYSIS COMPLETEWITH MICROSCOPIC ([facility] ONLY) - Abnormal; Notable for the following:    Color, Urine STRAW (*)    APPearance CLEAR (*)    Glucose, UA >500 (*)    Bacteria, UA RARE (*)    Squamous Epithelial / LPF 0-5 (*)    All other components within normal limits  TSH    ____________________________________________  EKG  none ____________________________________________  RADIOLOGY  none ____________________________________________   PROCEDURES  Procedure(s) performed: None  Procedures  Critical Care performed: No  ____________________________________________   INITIAL IMPRESSION / ASSESSMENT AND PLAN / ED COURSE  Pertinent labs & imaging results that were available during my care of the patient were reviewed by me and considered in my medical decision making (see chart for details).  Betty Luna is a 65 y.o. female with history of  CHF, diabetes, hypertension who presents for evaluation of one week of intermittent anxiety and feeling "jittery". On exam she is very well-appearing and in no acute distress. Vital signs are stable and she is afebrile. She has a benign physical examination. Her only complaint is of anxiety, she is not had any chest pains and she was discharged from the ER yesterday morning therefore do not think that she warrants evaluation for cardiac  related complaints. I discussed with her that her symptoms may be related to anxiety/panic. We'll treat with a very small dose of Ativan here, check her thyroid studies is that her primary concern. Discussed with her that we'll likely discharge her home with close PCP follow-up and psychiatric follow-up.  ----------------------------------------- 5:51 PM on 06/02/2016 ----------------------------------------- Patient reports that she feels much more calm after receiving 2.5 mg of Ativan. Her vital signs are stable. She has no new complaints. Urinalysis not consistent with infection. Normal TSH. T4 is 1.13 which is essentially upper limit of normal. I discussed this with her we discussed that she is to follow-up quickly with her primary care doctor and contact [facility] for psychiatric follow-up. Discussed return precautions and she is comfortable with the discharge plan. DC  home.  Clinical Course     ____________________________________________   FINAL CLINICAL IMPRESSION(S) / ED DIAGNOSES  Final diagnoses:  Anxiety      NEW MEDICATIONS STARTED DURING THIS VISIT:  New Prescriptions   No medications on file     Note:  This document was prepared using Dragon voice recognition software and may include unintentional dictation errors.    Gayla Doss, MD 06/02/16 [phone]

## 2016-06-02 NOTE — ED Notes (Signed)
No orders or protocols per Dr.Stafford

## 2016-06-06 ENCOUNTER — Encounter: Payer: Self-pay | Admitting: *Deleted

## 2016-06-06 DIAGNOSIS — E119 Type 2 diabetes mellitus without complications: Secondary | ICD-10-CM | POA: Diagnosis not present

## 2016-06-06 DIAGNOSIS — I110 Hypertensive heart disease with heart failure: Secondary | ICD-10-CM | POA: Insufficient documentation

## 2016-06-06 DIAGNOSIS — Z7984 Long term (current) use of oral hypoglycemic drugs: Secondary | ICD-10-CM | POA: Diagnosis not present

## 2016-06-06 DIAGNOSIS — Z794 Long term (current) use of insulin: Secondary | ICD-10-CM | POA: Diagnosis not present

## 2016-06-06 DIAGNOSIS — R002 Palpitations: Secondary | ICD-10-CM | POA: Insufficient documentation

## 2016-06-06 DIAGNOSIS — R079 Chest pain, unspecified: Secondary | ICD-10-CM | POA: Diagnosis present

## 2016-06-06 DIAGNOSIS — Z87891 Personal history of nicotine dependence: Secondary | ICD-10-CM | POA: Diagnosis not present

## 2016-06-06 DIAGNOSIS — I509 Heart failure, unspecified: Secondary | ICD-10-CM | POA: Insufficient documentation

## 2016-06-06 DIAGNOSIS — Z79899 Other long term (current) drug therapy: Secondary | ICD-10-CM | POA: Insufficient documentation

## 2016-06-06 LAB — COMPREHENSIVE METABOLIC PANEL
ALBUMIN: 4.1 g/dL (ref 3.5–5.0)
ALT: 28 U/L (ref 14–54)
ANION GAP: 12 (ref 5–15)
AST: 27 U/L (ref 15–41)
Alkaline Phosphatase: 117 U/L (ref 38–126)
BILIRUBIN TOTAL: 0.3 mg/dL (ref 0.3–1.2)
BUN: 13 mg/dL (ref 6–20)
CO2: 21 mmol/L — ABNORMAL LOW (ref 22–32)
Calcium: 9.5 mg/dL (ref 8.9–10.3)
Chloride: 99 mmol/L — ABNORMAL LOW (ref 101–111)
Creatinine, Ser: 0.83 mg/dL (ref 0.44–1.00)
Glucose, Bld: 359 mg/dL — ABNORMAL HIGH (ref 65–99)
POTASSIUM: 4.2 mmol/L (ref 3.5–5.1)
Sodium: 132 mmol/L — ABNORMAL LOW (ref 135–145)
TOTAL PROTEIN: 7.9 g/dL (ref 6.5–8.1)

## 2016-06-06 LAB — CBC
HCT: 38.4 % (ref 35.0–47.0)
HEMOGLOBIN: 12.4 g/dL (ref 12.0–16.0)
MCH: 23.2 pg — AB (ref 26.0–34.0)
MCHC: 32.4 g/dL (ref 32.0–36.0)
MCV: 71.7 fL — ABNORMAL LOW (ref 80.0–100.0)
PLATELETS: 254 10*3/uL (ref 150–440)
RBC: 5.36 MIL/uL — ABNORMAL HIGH (ref 3.80–5.20)
RDW: 15.8 % — ABNORMAL HIGH (ref 11.5–14.5)
WBC: 8.8 10*3/uL (ref 3.6–11.0)

## 2016-06-06 LAB — TROPONIN I

## 2016-06-06 NOTE — ED Triage Notes (Addendum)
Pt ambulatory to triage.  Pt reports chest pain today.  Pt had an echo yesterday.  No sob. No n/v/d.  Pt states pain radiates into neck and left arm.  Pt alert.  Speech clear.

## 2016-06-07 ENCOUNTER — Emergency Department
Admission: EM | Admit: 2016-06-07 | Discharge: 2016-06-07 | Disposition: A | Discharge status: Home / Self Care | Payer: Medicare Other | Attending: Emergency Medicine | Admitting: Emergency Medicine

## 2016-06-07 DIAGNOSIS — R002 Palpitations: Secondary | ICD-10-CM

## 2016-06-07 NOTE — ED Notes (Signed)
Pt discharged to home.  Family member driving.  Discharge instructions reviewed.  Verbalized understanding.  No questions or concerns at this time.  Teach back verified.  Pt in NAD.  No items left in ED.   

## 2016-06-07 NOTE — ED Notes (Signed)
Pt reports that she did not take PM dose of insulin tonight.  Pt also reports that she had an echo done yesterday by Dr. Lady Gary and is set to get her results on Friday from him.  Pt states that the echo was set up because she is being evaluated for congestive heart failure.

## 2016-06-07 NOTE — ED Provider Notes (Signed)
[facility] Emergency Department Provider Note    ____________________________________________   I have reviewed the triage vital signs and the nursing notes.   HISTORY  Chief Complaint Palpitations  History limited by: Not Limited   HPI Betty Luna is a 65 y.o. female who presents to the emergency department tonight because of concerns for palpitations. She states that they started yesterday evening. She was getting ready to go to bed. She states she noticed a number of palpitations. She has been having some issues with palpitations and chest pain past few weeks. Has been seen multiple times in the emergency department. She is also followed up with her primary care doctor and cardiology. She states she underwent a stress test yesterday. She does have some concerns because the ultrasonographer was making noises during the exam. She did state however that the ultrasonographer talk to a doctor at that time. The patient has not gotten her stress test results. She denies any shortness breath or fevers.   Past Medical History:  Diagnosis Date  . CHF (congestive heart failure) (HCC)   . Diabetes mellitus without complication (HCC)   . Hypertension     There are no active problems to display for this patient.   No past surgical history on file.  Prior to Admission medications   Medication Sig Start Date End Date Taking? Authorizing Provider  allopurinol (ZYLOPRIM) 300 MG tablet Take 1 tablet by mouth daily. 03/22/15  Yes Historical Provider, MD  atorvastatin (LIPITOR) 20 MG tablet Take 1 tablet by mouth daily. 03/22/15  Yes Historical Provider, MD  carvedilol (COREG) 25 MG tablet Take 1 tablet by mouth 2 (two) times daily. 02/14/15  Yes Historical Provider, MD  colchicine 0.6 MG tablet Take 1 tablet by mouth daily. 03/22/15  Yes Historical Provider, MD  Insulin Lispro Prot & Lispro (HUMALOG MIX 75/25 KWIKPEN) (75-25) 100 UNIT/ML Kwikpen Inject 25 Units into  the skin 2 (two) times daily before a meal.    Yes Historical Provider, MD  losartan (COZAAR) 50 MG tablet Take 1 tablet by mouth daily. 01/23/15  Yes Historical Provider, MD  metFORMIN (GLUCOPHAGE-XR) 500 MG 24 hr tablet Take 500 mg by mouth 2 (two) times daily.   Yes Historical Provider, MD  omeprazole (PRILOSEC) 40 MG capsule Take 1 capsule by mouth daily. 03/02/15  Yes Historical Provider, MD  spironolactone (ALDACTONE) 25 MG tablet Take 1 tablet by mouth daily. 02/21/15  Yes Historical Provider, MD  torsemide (DEMADEX) 10 MG tablet Take 1 tablet by mouth daily. 03/23/15  Yes Historical Provider, MD  sucralfate (CARAFATE) 1 G tablet Take 1 tablet (1 g total) by mouth 4 (four) times daily. 03/28/15 03/27/16  Rebecka Apley, MD    Allergies Codeine  No family history on file.  Social History Social History  Substance Use Topics  . Smoking status: Former Games developer  . Smokeless tobacco: Never Used  . Alcohol use Yes     Comment: occasonally    Review of Systems  Constitutional: Negative for fever. Cardiovascular: Negative for chest pain.Positive for palpitations Respiratory: Negative for shortness of breath. Gastrointestinal: Negative for abdominal pain, vomiting and diarrhea. Neurological: Negative for headaches, focal weakness or numbness.   10-point ROS otherwise negative.  ____________________________________________   PHYSICAL EXAM:  VITAL SIGNS: ED Triage Vitals  Enc Vitals Group     BP 06/06/16 2302 (!) 176/101     Pulse Rate 06/06/16 2302 90     Resp 06/06/16 2302 20     Temp  06/06/16 2302 98.1 F (36.7 C)     Temp Source 06/06/16 2302 Oral     SpO2 06/06/16 2302 97 %     Weight 06/06/16 2301 229 lb (103.9 kg)     Height 06/06/16 2301 5\' 2"  (1.575 m)     Head Circumference --      Peak Flow --      Pain Score 06/06/16 2301 8   Constitutional: Alert and oriented. Well appearing and in no distress. Eyes: Conjunctivae are normal. PERRL. Normal extraocular  movements. ENT   Head: Normocephalic and atraumatic.   Nose: No congestion/rhinnorhea.   Mouth/Throat: Mucous membranes are moist.   Neck: No stridor. Hematological/Lymphatic/Immunilogical: No cervical lymphadenopathy. Cardiovascular: Normal rate, regular rhythm.  No murmurs, rubs, or gallops. Respiratory: Normal respiratory effort without tachypnea nor retractions. Breath sounds are clear and equal bilaterally. No wheezes/rales/rhonchi. Gastrointestinal: Soft and nontender. No distention. Genitourinary: Deferred Musculoskeletal: Normal range of motion in all extremities. No joint effusions.  No lower extremity tenderness nor edema. Neurologic:  Normal speech and language. No gross focal neurologic deficits are appreciated.  Skin:  Skin is warm, dry and intact. No rash noted. Psychiatric: Mood and affect are normal. Speech and behavior are normal. Patient exhibits appropriate insight and judgment.  ____________________________________________    LABS (pertinent positives/negatives)  Labs Reviewed  CBC - Abnormal; Notable for the following:       Result Value   RBC 5.36 (*)    MCV 71.7 (*)    MCH 23.2 (*)    RDW 15.8 (*)    All other components within normal limits  COMPREHENSIVE METABOLIC PANEL - Abnormal; Notable for the following:    Sodium 132 (*)    Chloride 99 (*)    CO2 21 (*)    Glucose, Bld 359 (*)    All other components within normal limits  TROPONIN I    ____________________________________________   EKG  I, Phineas SemenGraydon Gwenevere Goga, attending physician, personally viewed and interpreted this EKG  EKG Time: 2301 Rate: 89 Rhythm: normal sinus rhythm Axis: normal Intervals: qtc 469 QRS: LVH, q waves V1 ST changes: no st elevation Impression: abnormal ekg   ____________________________________________     RADIOLOGY  None  ____________________________________________   PROCEDURES  Procedures  ____________________________________________   INITIAL IMPRESSION / ASSESSMENT AND PLAN / ED COURSE  Pertinent labs & imaging results that were available during my care of the patient were reviewed by me and considered in my medical decision making (see chart for details).  Patient presents to the emergency department tonight with primary concerns for palpitations file yesterday evening. Patient has been seen multiple lens in the emergency department recently. Additionally she has been to cardiology. Appears in some of this might be related somewhat to anxiety as she did have concern that the echocardiogram showed something bad. I discussed with the patient that likely the echocardiogram had showed something bad and the doctor was aware that at that time he would be discussed with the patient. Workup here without any concerning findings. Will plan on discharging to follow up with cardiology. ____________________________________________   FINAL CLINICAL IMPRESSION(S) / ED DIAGNOSES  Final diagnoses:  Palpitation     Note: This dictation was prepared with Dragon dictation. Any transcriptional errors that result from this process are unintentional    Phineas SemenGraydon Baneen Wieseler, MD 06/07/16 [id number]

## 2016-06-07 NOTE — Discharge Instructions (Signed)
Please seek medical attention for any high fevers, chest pain, shortness of breath, change in behavior, persistent vomiting, bloody stool or any other new or concerning symptoms.  

## 2016-09-09 ENCOUNTER — Emergency Department
Admission: EM | Admit: 2016-09-09 | Discharge: 2016-09-10 | Disposition: A | Discharge status: Home / Self Care | Payer: Medicare Other | Attending: Emergency Medicine | Admitting: Emergency Medicine

## 2016-09-09 DIAGNOSIS — R209 Unspecified disturbances of skin sensation: Secondary | ICD-10-CM

## 2016-09-09 DIAGNOSIS — I110 Hypertensive heart disease with heart failure: Secondary | ICD-10-CM | POA: Diagnosis not present

## 2016-09-09 DIAGNOSIS — Z79899 Other long term (current) drug therapy: Secondary | ICD-10-CM | POA: Diagnosis not present

## 2016-09-09 DIAGNOSIS — E119 Type 2 diabetes mellitus without complications: Secondary | ICD-10-CM | POA: Insufficient documentation

## 2016-09-09 DIAGNOSIS — Z87891 Personal history of nicotine dependence: Secondary | ICD-10-CM | POA: Diagnosis not present

## 2016-09-09 DIAGNOSIS — I509 Heart failure, unspecified: Secondary | ICD-10-CM | POA: Insufficient documentation

## 2016-09-09 DIAGNOSIS — Z794 Long term (current) use of insulin: Secondary | ICD-10-CM | POA: Insufficient documentation

## 2016-09-09 DIAGNOSIS — R202 Paresthesia of skin: Secondary | ICD-10-CM | POA: Insufficient documentation

## 2016-09-09 NOTE — ED Triage Notes (Signed)
Pt states that she has been having a headache all day, states that about an hour ago she started having tingling to the left side of her face, denies numbness anywhere, states that she checked her bp at home and her daistolic were running at 96 then 106, pt has symmetrical facial expressions, denies numbness with touch, and no deviation of tongue with protrusion

## 2016-09-10 NOTE — ED Notes (Signed)
Pt states left sided headache that began today around 1400 with associated left inner cheek numbness. Pt states sensation is equal now. Pt denies vomiting, photophobia, sensitivity to sound. Pt states she feels like she is beginning to come down with a cold. Speech clear.

## 2016-09-10 NOTE — ED Notes (Signed)
Pt provided with blankets, call bell at side.

## 2016-09-10 NOTE — ED Provider Notes (Signed)
[facility] Emergency Department Provider Note  ____________________________________________   First MD Initiated Contact with Patient 09/10/16 0154     (approximate)  I have reviewed the triage vital signs and the nursing notes.   HISTORY  Chief Complaint Tingling    HPI Betty Luna is a 65 y.o. female who presents by private vehicle for evaluation of intermittent (and now resolved) tingling in the left side of her face.  Occurred earlier today while she had a mild left-sided headache  centered mostly around her eye. Headache resolved after OTC pain medication, and facial tingling resolved as well.  The tingling occurring and briefly later in the evening and the patient noted that her blood pressure was elevated so she thought she should be checked out.  She denies any numbness or tingling in her extremities.  Has had no weakness in her extremities, no difficulty with ambulation, no visual changes.  Her headache has not come back.  She denies fever/chills, chest pain, shortness of breath, nausea, vomiting, abdominal pain, diarrhea.  He describes the symptoms as mild, went away after some over-the-counter pain medicine, and has resolved completely.  Past Medical History:  Diagnosis Date  . CHF (congestive heart failure) (HCC)   . Diabetes mellitus without complication (HCC)   . Hypertension     There are no active problems to display for this patient.   No past surgical history on file.  Prior to Admission medications   Medication Sig Start Date End Date Taking? Authorizing Provider  allopurinol (ZYLOPRIM) 300 MG tablet Take 1 tablet by mouth daily. 03/22/15   Historical Provider, MD  atorvastatin (LIPITOR) 20 MG tablet Take 1 tablet by mouth daily. 03/22/15   Historical Provider, MD  carvedilol (COREG) 25 MG tablet Take 1 tablet by mouth 2 (two) times daily. 02/14/15   Historical Provider, MD  colchicine 0.6 MG tablet Take 1 tablet by mouth daily.  03/22/15   Historical Provider, MD  Insulin Lispro Prot & Lispro (HUMALOG MIX 75/25 KWIKPEN) (75-25) 100 UNIT/ML Kwikpen Inject 25 Units into the skin 2 (two) times daily before a meal.     Historical Provider, MD  losartan (COZAAR) 50 MG tablet Take 1 tablet by mouth daily. 01/23/15   Historical Provider, MD  metFORMIN (GLUCOPHAGE-XR) 500 MG 24 hr tablet Take 500 mg by mouth 2 (two) times daily.    Historical Provider, MD  omeprazole (PRILOSEC) 40 MG capsule Take 1 capsule by mouth daily. 03/02/15   Historical Provider, MD  spironolactone (ALDACTONE) 25 MG tablet Take 1 tablet by mouth daily. 02/21/15   Historical Provider, MD  sucralfate (CARAFATE) 1 G tablet Take 1 tablet (1 g total) by mouth 4 (four) times daily. 03/28/15 03/27/16  Rebecka ApleyAllison P Webster, MD  torsemide (DEMADEX) 10 MG tablet Take 1 tablet by mouth daily. 03/23/15   Historical Provider, MD    Allergies Codeine  No family history on file.  Social History Social History  Substance Use Topics  . Smoking status: Former Games developermoker  . Smokeless tobacco: Never Used  . Alcohol use Yes     Comment: occasonally    Review of Systems Constitutional: No fever/chills Eyes: No visual changes. ENT: No sore throat. Cardiovascular: Denies chest pain. Respiratory: Denies shortness of breath. Gastrointestinal: No abdominal pain.  No nausea, no vomiting.  No diarrhea.  No constipation. Genitourinary: Negative for dysuria. Musculoskeletal: Negative for back pain. Skin: Negative for rash. Neurological: Tingling in the left side of her face that was brief and  is completely resolved  10-point ROS otherwise negative.  ____________________________________________   PHYSICAL EXAM:  VITAL SIGNS: ED Triage Vitals  Enc Vitals Group     BP 09/09/16 2304 (!) 171/90     Pulse Rate 09/09/16 2304 80     Resp 09/09/16 2304 18     Temp 09/09/16 2304 98 F (36.7 C)     Temp Source 09/09/16 2304 Oral     SpO2 09/09/16 2304 98 %     Weight 09/09/16 2304  228 lb (103.4 kg)     Height 09/09/16 2304 5\' 2"  (1.575 m)     Head Circumference --      Peak Flow --      Pain Score 09/09/16 2305 2     Pain Loc --      Pain Edu? --      Excl. in GC? --     Constitutional: Alert and oriented. Well appearing and in no acute distress. Eyes: Conjunctivae are normal. PERRL. EOMI. Ears:  No vesicular lesions in her ears Head: Atraumatic. Nose: No congestion/rhinnorhea. Mouth/Throat: Mucous membranes are moist.  Oropharynx non-erythematous. Neck: No stridor.  No meningeal signs.   Cardiovascular: Normal rate, regular rhythm. Good peripheral circulation. Grossly normal heart sounds. Respiratory: Normal respiratory effort.  No retractions. Lungs CTAB. Gastrointestinal: Soft and nontender. No distention.  Musculoskeletal: No lower extremity tenderness nor edema. No gross deformities of extremities. Neurologic:  Normal speech and language. No gross focal neurologic deficits are appreciated.  She has normal grip and major muscle group strength in bilateral upper and lower extremities, no sensory deficits appreciated on exam, no gross cranial nerve deficits, no evidence of ptosis/facial droop. Skin:  Skin is warm, dry and intact. No rash noted. Psychiatric: Mood and affect are normal. Speech and behavior are normal.  ____________________________________________   LABS (all labs ordered are listed, but only abnormal results are displayed)  Labs Reviewed - No data to display ____________________________________________  EKG  None - EKG not ordered by ED physician ____________________________________________  RADIOLOGY   No results found.  ____________________________________________   PROCEDURES  Procedure(s) performed:   Procedures   Critical Care performed: No ____________________________________________   INITIAL IMPRESSION / ASSESSMENT AND PLAN / ED COURSE  Pertinent labs & imaging results that were available during my care of the  patient were reviewed by me and considered in my medical decision making (see chart for details).  The patient is well-appearing and in no acute distress with no persistent symptoms.  Her description suggests a mild cluster headache or perhaps a migraine and does not point towards a CVA.  Bell's palsy is possible and may develop over time but as of right now she has no signs or symptoms other than the brief transient tingling.  I did inform her about this and suggested that if she wakes up and has some facial droop she should not panic but should seek additional medical evaluation given the possibility that it is Bell's palsy.  She is neurologically intact with no signs or symptoms of an acute medical condition and states that she wants to go home.  I think this is appropriate.  I gave my usual and customary return precautions.   Clinical Course     ____________________________________________  FINAL CLINICAL IMPRESSION(S) / ED DIAGNOSES  Final diagnoses:  Facial paresthesia     MEDICATIONS GIVEN DURING THIS VISIT:  Medications - No data to display   NEW OUTPATIENT MEDICATIONS STARTED DURING THIS VISIT:  New Prescriptions  No medications on file    Modified Medications   No medications on file    Discontinued Medications   No medications on file     Note:  This document was prepared using Dragon voice recognition software and may include unintentional dictation errors.    Loleta Rose, MD 09/10/16 [phone]

## 2016-09-10 NOTE — Discharge Instructions (Signed)
As we discussed, your workup today was reassuring.  Though we do not know exactly what is causing your symptoms, it appears that you have no emergent medical condition at this time and that you are safe to go home and follow up as recommended in this paperwork. ° °Please return immediately to the Emergency Department if you develop any new or worsening symptoms that concern you. ° °

## 2017-03-23 ENCOUNTER — Emergency Department: Payer: Medicare Other

## 2017-03-23 ENCOUNTER — Emergency Department
Admission: EM | Admit: 2017-03-23 | Discharge: 2017-03-24 | Disposition: A | Discharge status: Home / Self Care | Payer: Medicare Other | Attending: Emergency Medicine | Admitting: Emergency Medicine

## 2017-03-23 DIAGNOSIS — R52 Pain, unspecified: Secondary | ICD-10-CM

## 2017-03-23 DIAGNOSIS — A419 Sepsis, unspecified organism: Secondary | ICD-10-CM

## 2017-03-23 DIAGNOSIS — Z87891 Personal history of nicotine dependence: Secondary | ICD-10-CM | POA: Insufficient documentation

## 2017-03-23 DIAGNOSIS — Z794 Long term (current) use of insulin: Secondary | ICD-10-CM | POA: Insufficient documentation

## 2017-03-23 DIAGNOSIS — I110 Hypertensive heart disease with heart failure: Secondary | ICD-10-CM | POA: Diagnosis not present

## 2017-03-23 DIAGNOSIS — Z79899 Other long term (current) drug therapy: Secondary | ICD-10-CM | POA: Diagnosis not present

## 2017-03-23 DIAGNOSIS — N12 Tubulo-interstitial nephritis, not specified as acute or chronic: Secondary | ICD-10-CM | POA: Insufficient documentation

## 2017-03-23 DIAGNOSIS — I509 Heart failure, unspecified: Secondary | ICD-10-CM | POA: Insufficient documentation

## 2017-03-23 DIAGNOSIS — E119 Type 2 diabetes mellitus without complications: Secondary | ICD-10-CM | POA: Diagnosis not present

## 2017-03-23 DIAGNOSIS — R1031 Right lower quadrant pain: Secondary | ICD-10-CM | POA: Diagnosis present

## 2017-03-23 LAB — URINALYSIS, COMPLETE (UACMP) WITH MICROSCOPIC
BILIRUBIN URINE: NEGATIVE
GLUCOSE, UA: NEGATIVE mg/dL
HGB URINE DIPSTICK: NEGATIVE
Ketones, ur: NEGATIVE mg/dL
NITRITE: NEGATIVE
Protein, ur: NEGATIVE mg/dL
SPECIFIC GRAVITY, URINE: 1.014 (ref 1.005–1.030)
pH: 5 (ref 5.0–8.0)

## 2017-03-23 LAB — CBC WITH DIFFERENTIAL/PLATELET
BASOS ABS: 0.1 10*3/uL (ref 0–0.1)
Basophils Relative: 1 %
Eosinophils Absolute: 0.1 10*3/uL (ref 0–0.7)
Eosinophils Relative: 1 %
HCT: 35.3 % (ref 35.0–47.0)
HEMOGLOBIN: 11.7 g/dL — AB (ref 12.0–16.0)
LYMPHS ABS: 0.7 10*3/uL — AB (ref 1.0–3.6)
LYMPHS PCT: 6 %
MCH: 24.2 pg — ABNORMAL LOW (ref 26.0–34.0)
MCHC: 33.2 g/dL (ref 32.0–36.0)
MCV: 72.8 fL — AB (ref 80.0–100.0)
Monocytes Absolute: 0.8 10*3/uL (ref 0.2–0.9)
Monocytes Relative: 7 %
NEUTROS PCT: 85 %
Neutro Abs: 9.5 10*3/uL — ABNORMAL HIGH (ref 1.4–6.5)
Platelets: 191 10*3/uL (ref 150–440)
RBC: 4.85 MIL/uL (ref 3.80–5.20)
RDW: 16.8 % — ABNORMAL HIGH (ref 11.5–14.5)
WBC: 11.2 10*3/uL — AB (ref 3.6–11.0)

## 2017-03-23 LAB — COMPREHENSIVE METABOLIC PANEL
ALT: 27 U/L (ref 14–54)
ANION GAP: 10 (ref 5–15)
AST: 26 U/L (ref 15–41)
Albumin: 3.7 g/dL (ref 3.5–5.0)
Alkaline Phosphatase: 70 U/L (ref 38–126)
BUN: 24 mg/dL — ABNORMAL HIGH (ref 6–20)
CHLORIDE: 100 mmol/L — AB (ref 101–111)
CO2: 25 mmol/L (ref 22–32)
Calcium: 9.3 mg/dL (ref 8.9–10.3)
Creatinine, Ser: 0.84 mg/dL (ref 0.44–1.00)
GFR calc Af Amer: >60 mL/min (ref >60)
Glucose, Bld: 180 mg/dL — ABNORMAL HIGH (ref 65–99)
POTASSIUM: 3.8 mmol/L (ref 3.5–5.1)
SODIUM: 135 mmol/L (ref 135–145)
Total Bilirubin: 1 mg/dL (ref 0.3–1.2)
Total Protein: 7.5 g/dL (ref 6.5–8.1)

## 2017-03-23 LAB — LACTIC ACID, PLASMA: LACTIC ACID, VENOUS: 1.8 mmol/L (ref 0.5–1.9)

## 2017-03-23 LAB — PROTIME-INR
INR: 1.07
PROTHROMBIN TIME: 13.9 s (ref 11.4–15.2)

## 2017-03-23 NOTE — ED Provider Notes (Signed)
[facility] Emergency Department Provider Note   ____________________________________________   First MD Initiated Contact with Patient 03/23/17 2317     (approximate)  I have reviewed the triage vital signs and the nursing notes.   HISTORY  Chief Complaint Abdominal Pain (RLQ)    HPI Betty Luna is a 66 y.o. female who reports she had a UTI 2 months ago and was taking Bactrim she did not take all of her medicine. She did take another Bactrim today. She has a fever of 102.9 she had right lower quadrant pain earlier but this is now gone she is having urinary frequency but no real urgency or dysuria. She is also having chronic back pain specialist left side. She is not having any cough is not having any other abdominal pain is no pain with walking she hits the bumps in the road. Past Medical History:  Diagnosis Date  . CHF (congestive heart failure) (HCC)   . Diabetes mellitus without complication (HCC)   . Hypertension     There are no active problems to display for this patient.   Past Surgical History:  Procedure Laterality Date  . ABDOMINAL HYSTERECTOMY    . TONSILLECTOMY      Prior to Admission medications   Medication Sig Start Date End Date Taking? Authorizing Provider  allopurinol (ZYLOPRIM) 300 MG tablet Take 1 tablet by mouth daily. 03/22/15   [provider]  atorvastatin (LIPITOR) 20 MG tablet Take 1 tablet by mouth daily. 03/22/15   [provider]  carvedilol (COREG) 25 MG tablet Take 1 tablet by mouth 2 (two) times daily. 02/14/15   [provider]  colchicine 0.6 MG tablet Take 1 tablet by mouth daily. 03/22/15   [provider]  Insulin Lispro Prot & Lispro (HUMALOG MIX 75/25 KWIKPEN) (75-25) 100 UNIT/ML Kwikpen Inject 25 Units into the skin 2 (two) times daily before a meal.     [provider]  JANUVIA 100 MG tablet Take 100 mg by mouth daily. 03/04/17   [provider]    losartan (COZAAR) 50 MG tablet Take 1 tablet by mouth daily. 01/23/15   [provider]  metFORMIN (GLUCOPHAGE-XR) 500 MG 24 hr tablet Take 500 mg by mouth 2 (two) times daily.    [provider]  omeprazole (PRILOSEC) 40 MG capsule Take 1 capsule by mouth daily. 03/02/15   [provider]  pantoprazole (PROTONIX) 40 MG tablet Take 40 mg by mouth daily. 03/07/17   [provider]  spironolactone (ALDACTONE) 25 MG tablet Take 1 tablet by mouth daily. 02/21/15   [provider]  sucralfate (CARAFATE) 1 G tablet Take 1 tablet (1 g total) by mouth 4 (four) times daily. 03/28/15 03/27/16  Rebecka Apley, MD  sulfamethoxazole-trimethoprim (BACTRIM DS,SEPTRA DS) 800-160 MG tablet Take 1 tablet by mouth 2 (two) times daily. 03/24/17   Arnaldo Natal, MD  torsemide (DEMADEX) 10 MG tablet Take 1 tablet by mouth daily. 03/23/15   [provider]    Allergies Codeine  No family history on file.  Social History Social History  Substance Use Topics  . Smoking status: Former Games developer  . Smokeless tobacco: Never Used  . Alcohol use Yes     Comment: occasonally    Review of Systems  Constitutional:fever/chills Eyes: No visual changes. ENT: No sore throat. Cardiovascular: Denies chest pain. Respiratory: Denies shortness of breath. Gastrointestinal: No abdominal pain At present.  No nausea, no vomiting.  No diarrhea.  No  constipation. Genitourinary: Negative for dysuria. Musculoskeletal: Negative for back pain. Skin: Negative for rash. Neurological: Negative for headaches, focal weakness or numbness.  ____________________________________________   PHYSICAL EXAM:  VITAL SIGNS: ED Triage Vitals  Enc Vitals Group     BP 03/23/17 2253 (!) 163/80     Pulse Rate 03/23/17 2253 (!) 112     Resp 03/23/17 2253 18     Temp 03/23/17 2253 (!) 102.9 F (39.4 C)     Temp Source 03/23/17 2253 Oral     SpO2 03/23/17 2253 99 %     Weight 03/23/17 2253 223  lb (101.2 kg)     Height 03/23/17 2253 5\' 2"  (1.575 m)     Head Circumference --      Peak Flow --      Pain Score 03/23/17 2255 9     Pain Loc --      Pain Edu? --      Excl. in GC? --     Constitutional: Alert and oriented. Well appearing and in no acute distress. Eyes: Conjunctivae are normal. PERRL. EOMI. Head: Atraumatic. Nose: No congestion/rhinnorhea. Mouth/Throat: Mucous membranes are moist.  Oropharynx non-erythematous. Neck: No stridor. Cardiovascular: Normal rate, regular rhythm. Grossly normal heart sounds.  Good peripheral circulation. Respiratory: Normal respiratory effort.  No retractions. Lungs CTAB. Gastrointestinal: Soft and nontender. No distention. No abdominal bruits. No CVA tenderness. Musculoskeletal: No lower extremity tenderness nor edema.  No joint effusions. Neurologic:  Normal speech and language. No gross focal neurologic deficits are appreciated. No gait instability. Skin:  Skin is warm, dry and intact. No rash noted. Psychiatric: Mood and affect are normal. Speech and behavior are normal.  ____________________________________________   LABS (all labs ordered are listed, but only abnormal results are displayed)  Labs Reviewed  COMPREHENSIVE METABOLIC PANEL - Abnormal; Notable for the following:       Result Value   Chloride 100 (*)    Glucose, Bld 180 (*)    BUN 24 (*)    All other components within normal limits  CBC WITH DIFFERENTIAL/PLATELET - Abnormal; Notable for the following:    WBC 11.2 (*)    Hemoglobin 11.7 (*)    MCV 72.8 (*)    MCH 24.2 (*)    RDW 16.8 (*)    Neutro Abs 9.5 (*)    Lymphs Abs 0.7 (*)    All other components within normal limits  URINALYSIS, COMPLETE (UACMP) WITH MICROSCOPIC - Abnormal; Notable for the following:    Color, Urine YELLOW (*)    APPearance CLEAR (*)    Leukocytes, UA SMALL (*)    Bacteria, UA RARE (*)    Squamous Epithelial / LPF 0-5 (*)    All other components within normal limits  CULTURE,  BLOOD (ROUTINE X 2)  CULTURE, BLOOD (ROUTINE X 2)  LACTIC ACID, PLASMA  PROTIME-INR  LACTIC ACID, PLASMA   ____________________________________________  EKG    ____________________________________________  RADIOLOGY  Dg Chest 1 View  Result Date: 03/23/2017 CLINICAL DATA:  Lower abdominal pain fever chills and dysuria EXAM: CHEST 1 VIEW COMPARISON:  06/01/2016, 05/28/2016 FINDINGS: Streaky bibasilar atelectasis versus chronic bronchial thickening. No focal consolidation or effusion. Normal cardiomediastinal silhouette with atherosclerosis. No pneumothorax. IMPRESSION: No active disease. Electronically Signed   By: Jasmine Pang M.D.   On: 03/23/2017 23:29   Ct Renal Stone Study  Result Date: 03/24/2017 CLINICAL DATA:  Lower abdominal pain with fever chills and dysuria EXAM: CT ABDOMEN AND PELVIS WITHOUT CONTRAST TECHNIQUE: Multidetector  CT imaging of the abdomen and pelvis was performed following the standard protocol without IV contrast. COMPARISON:  08/09/2015 FINDINGS: Lower chest: Lung bases demonstrate no acute consolidation or effusion. Linear atelectasis in the right middle lobe and right base. Coronary artery calcification. Borderline heart size. Hepatobiliary: No focal liver abnormality is seen. Fatty liver. No gallstones, gallbladder wall thickening, or biliary dilatation. Pancreas: Unremarkable. No pancreatic ductal dilatation or surrounding inflammatory changes. Spleen: Normal in size without focal abnormality. Adrenals/Urinary Tract: Adrenal glands are within normal limits. No hydronephrosis. 4 mm non obstructing stone lower pole left kidney. Bladder normal. Stomach/Bowel: Stomach is within normal limits. Appendix appears normal. No evidence of bowel wall thickening, distention, or inflammatory changes. Vascular/Lymphatic: Aortic atherosclerosis. No enlarged abdominal or pelvic lymph nodes. Reproductive: Status post hysterectomy. No adnexal masses. Other: No free air or free fluid.   Ventral scar. Musculoskeletal: Degenerative changes of the spine. No acute or suspicious bone lesion. IMPRESSION: 1. No evidence for hydronephrosis or ureteral stone 2. Nonobstructing 4 mm stone in the lower pole of the left kidney Electronically Signed   By: Jasmine Pang M.D.   On: 03/24/2017 01:47   IMPRESSION: 1. No evidence for hydronephrosis or ureteral stone 2. Nonobstructing 4 mm stone in the lower pole of the left kidney   Electronically Signed   By: Jasmine Pang M.D.   On: 03/24/2017 01:47  ____________________________________________   PROCEDURES  Procedure(s) performed:  Procedures  Critical Care performed:  ____________________________________________   INITIAL IMPRESSION / ASSESSMENT AND PLAN / ED COURSE  Pertinent labs & imaging results that were available during my care of the patient were reviewed by me and considered in my medical decision making (see chart for details).   Patient is doing well on discharge not nauseated pain is better will let her go.     ____________________________________________   FINAL CLINICAL IMPRESSION(S) / ED DIAGNOSES  Final diagnoses:  Pain  Pyelonephritis      NEW MEDICATIONS STARTED DURING THIS VISIT:  New Prescriptions   SULFAMETHOXAZOLE-TRIMETHOPRIM (BACTRIM DS,SEPTRA DS) 800-160 MG TABLET    Take 1 tablet by mouth 2 (two) times daily.     Note:  This document was prepared using Dragon voice recognition software and may include unintentional dictation errors.    Arnaldo Natal, MD 03/24/17 [phone]

## 2017-03-23 NOTE — ED Triage Notes (Signed)
Patient c/o lower abdominal pain, fever, chills and dysuria. Pt treated for UTI in April. Pt still has antibiotics she did not complete - Bactrim; patient took one today. Patient also took 1 tylenol PM today.

## 2017-03-24 ENCOUNTER — Emergency Department: Payer: Medicare Other

## 2017-03-24 DIAGNOSIS — N12 Tubulo-interstitial nephritis, not specified as acute or chronic: Secondary | ICD-10-CM | POA: Diagnosis not present

## 2017-03-24 MED ORDER — CEFTRIAXONE SODIUM IN DEXTROSE 20 MG/ML IV SOLN
1.0000 g | Freq: Once | INTRAVENOUS | Status: AC
  Administered 2017-03-24: 1 g via INTRAVENOUS
  Filled 2017-03-24: qty 50

## 2017-03-24 MED ORDER — SULFAMETHOXAZOLE-TRIMETHOPRIM 800-160 MG PO TABS: 1.0000 | ORAL_TABLET | Freq: Two times a day (BID) | ORAL | 0 refills | Status: DC

## 2017-03-24 NOTE — ED Notes (Signed)

## 2017-03-24 NOTE — Discharge Instructions (Signed)
Please take the Bactrim twice a day with plenty of fluids. I would take the medicine for total of at least 12 days. So use the prescription I gave you and finish what you had from before. Please return if you have a higher fever can keep the medicine down or feel sicker. These follow-up with your doctor sometime later this week.

## 2017-03-24 NOTE — ED Notes (Signed)
Pt in ct at this time ° °

## 2017-03-28 LAB — CULTURE, BLOOD (ROUTINE X 2)
CULTURE: NO GROWTH
CULTURE: NO GROWTH
SPECIAL REQUESTS: ADEQUATE
SPECIAL REQUESTS: ADEQUATE

## 2017-04-15 ENCOUNTER — Encounter: Payer: Self-pay | Admitting: *Deleted

## 2017-04-16 ENCOUNTER — Ambulatory Visit
Admission: RE | Admit: 2017-04-16 | Discharge: 2017-04-16 | Disposition: A | Discharge status: Home / Self Care | Payer: Medicare Other | Source: Ambulatory Visit | Attending: Ophthalmology | Admitting: Ophthalmology

## 2017-04-16 ENCOUNTER — Ambulatory Visit: Payer: Medicare Other | Admitting: Anesthesiology

## 2017-04-16 ENCOUNTER — Encounter: Payer: Self-pay | Admitting: *Deleted

## 2017-04-16 ENCOUNTER — Encounter
Admission: RE | Disposition: A | Discharge status: Home / Self Care | Payer: Self-pay | Source: Ambulatory Visit | Attending: Ophthalmology

## 2017-04-16 DIAGNOSIS — Z7982 Long term (current) use of aspirin: Secondary | ICD-10-CM | POA: Insufficient documentation

## 2017-04-16 DIAGNOSIS — H2511 Age-related nuclear cataract, right eye: Secondary | ICD-10-CM | POA: Diagnosis not present

## 2017-04-16 DIAGNOSIS — Z79899 Other long term (current) drug therapy: Secondary | ICD-10-CM | POA: Insufficient documentation

## 2017-04-16 DIAGNOSIS — I110 Hypertensive heart disease with heart failure: Secondary | ICD-10-CM | POA: Diagnosis not present

## 2017-04-16 DIAGNOSIS — Z794 Long term (current) use of insulin: Secondary | ICD-10-CM | POA: Diagnosis not present

## 2017-04-16 DIAGNOSIS — Z87891 Personal history of nicotine dependence: Secondary | ICD-10-CM | POA: Insufficient documentation

## 2017-04-16 DIAGNOSIS — I509 Heart failure, unspecified: Secondary | ICD-10-CM | POA: Insufficient documentation

## 2017-04-16 DIAGNOSIS — E1136 Type 2 diabetes mellitus with diabetic cataract: Secondary | ICD-10-CM | POA: Insufficient documentation

## 2017-04-16 DIAGNOSIS — J449 Chronic obstructive pulmonary disease, unspecified: Secondary | ICD-10-CM | POA: Insufficient documentation

## 2017-04-16 DIAGNOSIS — K219 Gastro-esophageal reflux disease without esophagitis: Secondary | ICD-10-CM | POA: Diagnosis not present

## 2017-04-16 HISTORY — DX: Major depressive disorder, single episode, unspecified: F32.9

## 2017-04-16 HISTORY — DX: Gout, unspecified: M10.9

## 2017-04-16 HISTORY — DX: Gastro-esophageal reflux disease without esophagitis: K21.9

## 2017-04-16 HISTORY — DX: Unspecified osteoarthritis, unspecified site: M19.90

## 2017-04-16 HISTORY — PX: CATARACT EXTRACTION W/PHACO: SHX586

## 2017-04-16 HISTORY — DX: Depression, unspecified: F32.A

## 2017-04-16 HISTORY — DX: Anemia, unspecified: D64.9

## 2017-04-16 LAB — GLUCOSE, CAPILLARY: GLUCOSE-CAPILLARY: 175 mg/dL — AB (ref 65–99)

## 2017-04-16 SURGERY — PHACOEMULSIFICATION, CATARACT, WITH IOL INSERTION
Anesthesia: Monitor Anesthesia Care | Site: Eye | Laterality: Right | Wound class: Clean

## 2017-04-16 MED ORDER — MIDAZOLAM HCL 2 MG/2ML IJ SOLN
INTRAMUSCULAR | Status: DC | PRN
  Administered 2017-04-16: 2 mg via INTRAVENOUS

## 2017-04-16 MED ORDER — NA CHONDROIT SULF-NA HYALURON 40-17 MG/ML IO SOLN
INTRAOCULAR | Status: AC
  Filled 2017-04-16: qty 1

## 2017-04-16 MED ORDER — EPINEPHRINE PF 1 MG/ML IJ SOLN
INTRAMUSCULAR | Status: DC | PRN
  Administered 2017-04-16: 1 mL via OPHTHALMIC

## 2017-04-16 MED ORDER — ONDANSETRON HCL 4 MG/2ML IJ SOLN
INTRAMUSCULAR | Status: DC | PRN
  Administered 2017-04-16: 4 mg via INTRAVENOUS

## 2017-04-16 MED ORDER — LIDOCAINE HCL (PF) 4 % IJ SOLN
INTRAOCULAR | Status: DC | PRN
  Administered 2017-04-16: 2 mL via OPHTHALMIC

## 2017-04-16 MED ORDER — MOXIFLOXACIN HCL 0.5 % OP SOLN
OPHTHALMIC | Status: AC
  Filled 2017-04-16: qty 3

## 2017-04-16 MED ORDER — FENTANYL CITRATE (PF) 100 MCG/2ML IJ SOLN
INTRAMUSCULAR | Status: DC | PRN
  Administered 2017-04-16 (×2): 50 ug via INTRAVENOUS

## 2017-04-16 MED ORDER — ONDANSETRON HCL 4 MG/2ML IJ SOLN
INTRAMUSCULAR | Status: AC
  Filled 2017-04-16: qty 2

## 2017-04-16 MED ORDER — CARBACHOL 0.01 % IO SOLN
INTRAOCULAR | Status: DC | PRN
  Administered 2017-04-16: .5 mL via INTRAOCULAR

## 2017-04-16 MED ORDER — POVIDONE-IODINE 5 % OP SOLN
OPHTHALMIC | Status: AC
  Filled 2017-04-16: qty 30

## 2017-04-16 MED ORDER — FENTANYL CITRATE (PF) 100 MCG/2ML IJ SOLN
INTRAMUSCULAR | Status: AC
  Filled 2017-04-16: qty 2

## 2017-04-16 MED ORDER — SODIUM CHLORIDE 0.9 % IV SOLN
INTRAVENOUS | Status: DC
  Administered 2017-04-16 (×2): via INTRAVENOUS

## 2017-04-16 MED ORDER — POVIDONE-IODINE 5 % OP SOLN
OPHTHALMIC | Status: DC | PRN
  Administered 2017-04-16: 1 via OPHTHALMIC

## 2017-04-16 MED ORDER — MOXIFLOXACIN HCL 0.5 % OP SOLN
OPHTHALMIC | Status: DC | PRN
  Administered 2017-04-16: .2 mL via OPHTHALMIC

## 2017-04-16 MED ORDER — NA CHONDROIT SULF-NA HYALURON 40-17 MG/ML IO SOLN
INTRAOCULAR | Status: DC | PRN
  Administered 2017-04-16: 1 mL via INTRAOCULAR

## 2017-04-16 MED ORDER — ARMC OPHTHALMIC DILATING DROPS
1.0000 "application " | OPHTHALMIC | Status: AC
  Administered 2017-04-16 (×3): 1 via OPHTHALMIC

## 2017-04-16 MED ORDER — EPINEPHRINE PF 1 MG/ML IJ SOLN
INTRAMUSCULAR | Status: AC
  Filled 2017-04-16: qty 2

## 2017-04-16 MED ORDER — MIDAZOLAM HCL 2 MG/2ML IJ SOLN
INTRAMUSCULAR | Status: AC
  Filled 2017-04-16: qty 2

## 2017-04-16 MED ORDER — LIDOCAINE HCL (PF) 4 % IJ SOLN
INTRAMUSCULAR | Status: AC
  Filled 2017-04-16: qty 5

## 2017-04-16 MED ORDER — ARMC OPHTHALMIC DILATING DROPS
OPHTHALMIC | Status: AC
  Filled 2017-04-16: qty 0.4

## 2017-04-16 MED ORDER — MOXIFLOXACIN HCL 0.5 % OP SOLN: 1.0000 [drp] | OPHTHALMIC | Status: DC | PRN

## 2017-04-16 SURGICAL SUPPLY — 14 items
GLOVE BIO SURGEON STRL SZ8 (GLOVE) ×3 IMPLANT
GLOVE BIOGEL M 6.5 STRL (GLOVE) ×3 IMPLANT
GLOVE SURG LX 8.0 MICRO (GLOVE) ×2
GLOVE SURG LX STRL 8.0 MICRO (GLOVE) ×1 IMPLANT
GOWN STRL REUS W/ TWL LRG LVL3 (GOWN DISPOSABLE) ×2 IMPLANT
GOWN STRL REUS W/TWL LRG LVL3 (GOWN DISPOSABLE) ×4
LENS IOL TECNIS ITEC 21.0 (Intraocular Lens) ×3 IMPLANT
PACK CATARACT (MISCELLANEOUS) ×3 IMPLANT
PACK CATARACT BRASINGTON LX (MISCELLANEOUS) ×3 IMPLANT
SOL BSS BAG (MISCELLANEOUS) ×3
SOLUTION BSS BAG (MISCELLANEOUS) ×1 IMPLANT
SYR 5ML LL (SYRINGE) ×3 IMPLANT
WATER STERILE IRR 250ML POUR (IV SOLUTION) ×3 IMPLANT
WIPE NON LINTING 3.25X3.25 (MISCELLANEOUS) ×3 IMPLANT

## 2017-04-16 NOTE — Anesthesia Post-op Follow-up Note (Cosign Needed)
Anesthesia QCDR form completed.        

## 2017-04-16 NOTE — Op Note (Signed)
PREOPERATIVE DIAGNOSIS:  Nuclear sclerotic cataract of the right eye.   POSTOPERATIVE DIAGNOSIS:  nuclear sclerotic cataract right eye   OPERATIVE PROCEDURE: Procedure(s): CATARACT EXTRACTION PHACO AND INTRAOCULAR LENS PLACEMENT (IOC)   SURGEON:  Galen Manila, MD.   ANESTHESIA:  Anesthesiologist: Darleene Cleaver, Gerrit Heck, MD CRNA: Junious Silk, CRNA  1.      Managed anesthesia care. 2.      0.92ml of Shugarcaine was instilled in the eye following the paracentesis.   COMPLICATIONS:  None.   TECHNIQUE:   Stop and chop   DESCRIPTION OF PROCEDURE:  The patient was examined and consented in the preoperative holding area where the aforementioned topical anesthesia was applied to the right eye and then brought back to the Operating Room where the right eye was prepped and draped in the usual sterile ophthalmic fashion and a lid speculum was placed. A paracentesis was created with the side port blade and the anterior chamber was filled with viscoelastic. A near clear corneal incision was performed with the steel keratome. A continuous curvilinear capsulorrhexis was performed with a cystotome followed by the capsulorrhexis forceps. Hydrodissection and hydrodelineation were carried out with BSS on a blunt cannula. The lens was removed in a stop and chop  technique and the remaining cortical material was removed with the irrigation-aspiration handpiece. The capsular bag was inflated with viscoelastic and the Technis [id number]  lens was placed in the capsular bag without complication. The remaining viscoelastic was removed from the eye with the irrigation-aspiration handpiece. The wounds were hydrated. The anterior chamber was flushed with Miostat and the eye was inflated to physiologic pressure. 0.19ml of Vigamox was placed in the anterior chamber. The wounds were found to be water tight. The eye was dressed with Vigamox. The patient was given protective glasses to wear throughout the day and a shield with  which to sleep tonight. The patient was also given drops with which to begin a drop regimen today and will follow-up with me in one day.  Implant Name Type Inv. Item Serial No. Manufacturer Lot No. LRB No. Used  LENS IOL DIOP 21.0 - [id number] Intraocular Lens LENS IOL DIOP 21.0 [id number] AMO   Right 1   Procedure(s) with comments: CATARACT EXTRACTION PHACO AND INTRAOCULAR LENS PLACEMENT (IOC) (Right) - [place] 01:06 AP% 16.5 CDE 11.03 Fluid pack lot # [id number]  Electronically signed: Aryonna Gunnerson L[place]IS 04/16/2017 11:59 AM

## 2017-04-16 NOTE — Discharge Instructions (Signed)
Eye Surgery Discharge Instructions  Expect mild scratchy sensation or mild soreness. DO NOT RUB YOUR EYE!  The day of surgery:  Minimal physical activity, but bed rest is not required  No reading, computer work, or close hand work  No bending, lifting, or straining.  May watch TV  For 24 hours:  No driving, legal decisions, or alcoholic beverages  Safety precautions  Eat anything you prefer: It is better to start with liquids, then soup then solid foods.  _____ Eye patch should be worn until postoperative exam tomorrow.  ____ Solar shield eyeglasses should be worn for comfort in the sunlight/patch while sleeping  Resume all regular medications including aspirin or Coumadin if these were discontinued prior to surgery. You may shower, bathe, shave, or wash your hair. Tylenol may be taken for mild discomfort.  Call your doctor if you experience significant pain, nausea, or vomiting, fever > 101 or other signs of infection. [phone] or [phone] Specific instructions:  Follow-up Information    Galen Manila, MD Follow up on 04/17/2017.   Specialty:  Ophthalmology Why:  10:30 Contact information: [address] [place] [address] [phone]

## 2017-04-16 NOTE — Anesthesia Postprocedure Evaluation (Signed)
Anesthesia Post Note  Patient: Betty Luna  Procedure(s) Performed: Procedure(s) (LRB): CATARACT EXTRACTION PHACO AND INTRAOCULAR LENS PLACEMENT (IOC) (Right)  Patient location during evaluation: PACU Anesthesia Type: MAC Level of consciousness: awake Pain management: pain level controlled Vital Signs Assessment: post-procedure vital signs reviewed and stable Respiratory status: nonlabored ventilation Cardiovascular status: stable Anesthetic complications: no     Last Vitals:  Vitals:   04/16/17 1204 04/16/17 1226  BP:  138/81  Pulse: 76 74  Resp: 16 16  Temp: 36.7 C     Last Pain:  Vitals:   04/16/17 1204  TempSrc: Oral  PainSc:                  VAN STAVEREN,Ascencion Stegner

## 2017-04-16 NOTE — Anesthesia Preprocedure Evaluation (Signed)
Anesthesia Evaluation  Patient identified by MRN, date of birth, ID band Patient awake    Reviewed: Allergy & Precautions, NPO status , Patient's Chart, lab work & pertinent test results  Airway Mallampati: II       Dental  (+) Teeth Intact, Caps   Pulmonary COPD, former smoker,     + decreased breath sounds      Cardiovascular Exercise Tolerance: Good hypertension, Pt. on home beta blockers +CHF   Rhythm:Regular     Neuro/Psych Depression    GI/Hepatic Neg liver ROS, GERD  Medicated,  Endo/Other  diabetes, Type 2, Oral Hypoglycemic Agents  Renal/GU negative Renal ROS     Musculoskeletal   Abdominal (+) + obese,   Peds  Hematology  (+) anemia ,   Anesthesia Other Findings   Reproductive/Obstetrics                             Anesthesia Physical Anesthesia Plan  ASA: III  Anesthesia Plan: MAC   Post-op Pain Management:    Induction: Intravenous  PONV Risk Score and Plan:   Airway Management Planned: Natural Airway and Nasal Cannula  Additional Equipment:   Intra-op Plan:   Post-operative Plan:   Informed Consent: I have reviewed the patients History and Physical, chart, labs and discussed the procedure including the risks, benefits and alternatives for the proposed anesthesia with the patient or authorized representative who has indicated his/her understanding and acceptance.     Plan Discussed with: CRNA  Anesthesia Plan Comments:         Anesthesia Quick Evaluation

## 2017-04-16 NOTE — H&P (Signed)
All labs reviewed. Abnormal studies sent to patients PCP when indicated.  Previous H&P reviewed, patient examined, there are NO CHANGES.  Lyndi Holbein LOUIS6/26/201811:30 AM

## 2017-04-16 NOTE — Transfer of Care (Signed)
Immediate Anesthesia Transfer of Care Note  Patient: Betty Luna  Procedure(s) Performed: Procedure(s) with comments: CATARACT EXTRACTION PHACO AND INTRAOCULAR LENS PLACEMENT (IOC) (Right) - [place] 01:06 AP% 16.5 CDE 11.03 Fluid pack lot # [id number]  Patient Location: PACU  Anesthesia Type:MAC  Level of Consciousness: awake, alert  and oriented  Airway & Oxygen Therapy: Patient Spontanous Breathing  Post-op Assessment: Report given to RN and Post -op Vital signs reviewed and stable  Post vital signs: Reviewed and stable  Last Vitals:  Vitals:   04/16/17 0954  BP: (!) 142/74  Pulse: 73  Resp: 16  Temp: 36.3 C    Last Pain:  Vitals:   04/16/17 0954  TempSrc: Tympanic  PainSc: 8          Complications: No apparent anesthesia complications

## 2017-06-13 ENCOUNTER — Encounter: Payer: Self-pay | Admitting: Emergency Medicine

## 2017-06-13 ENCOUNTER — Emergency Department: Payer: Medicare Other

## 2017-06-13 DIAGNOSIS — I10 Essential (primary) hypertension: Secondary | ICD-10-CM | POA: Diagnosis not present

## 2017-06-13 DIAGNOSIS — Z87891 Personal history of nicotine dependence: Secondary | ICD-10-CM | POA: Diagnosis not present

## 2017-06-13 DIAGNOSIS — Z794 Long term (current) use of insulin: Secondary | ICD-10-CM | POA: Insufficient documentation

## 2017-06-13 DIAGNOSIS — R079 Chest pain, unspecified: Secondary | ICD-10-CM | POA: Diagnosis present

## 2017-06-13 DIAGNOSIS — I119 Hypertensive heart disease without heart failure: Secondary | ICD-10-CM | POA: Diagnosis not present

## 2017-06-13 DIAGNOSIS — Z7982 Long term (current) use of aspirin: Secondary | ICD-10-CM | POA: Diagnosis not present

## 2017-06-13 DIAGNOSIS — F419 Anxiety disorder, unspecified: Secondary | ICD-10-CM | POA: Diagnosis not present

## 2017-06-13 DIAGNOSIS — E119 Type 2 diabetes mellitus without complications: Secondary | ICD-10-CM | POA: Insufficient documentation

## 2017-06-13 DIAGNOSIS — Z79899 Other long term (current) drug therapy: Secondary | ICD-10-CM | POA: Insufficient documentation

## 2017-06-13 NOTE — ED Triage Notes (Signed)
Pt ambulatory to triage in NAD, report left sided chest pain starting tonight w/ radiation to left arm and back.  Reports BP elevated too and has had headache and feeling faint.

## 2017-06-14 ENCOUNTER — Emergency Department
Admission: EM | Admit: 2017-06-14 | Discharge: 2017-06-14 | Disposition: A | Discharge status: Home / Self Care | Payer: Medicare Other | Attending: Emergency Medicine | Admitting: Emergency Medicine

## 2017-06-14 DIAGNOSIS — R079 Chest pain, unspecified: Secondary | ICD-10-CM

## 2017-06-14 DIAGNOSIS — F419 Anxiety disorder, unspecified: Secondary | ICD-10-CM

## 2017-06-14 LAB — CBC
HCT: 34.7 % — ABNORMAL LOW (ref 35.0–47.0)
Hemoglobin: 11.6 g/dL — ABNORMAL LOW (ref 12.0–16.0)
MCH: 24.2 pg — ABNORMAL LOW (ref 26.0–34.0)
MCHC: 33.5 g/dL (ref 32.0–36.0)
MCV: 72.3 fL — AB (ref 80.0–100.0)
PLATELETS: 223 10*3/uL (ref 150–440)
RBC: 4.79 MIL/uL (ref 3.80–5.20)
RDW: 16.3 % — ABNORMAL HIGH (ref 11.5–14.5)
WBC: 8 10*3/uL (ref 3.6–11.0)

## 2017-06-14 LAB — BASIC METABOLIC PANEL
Anion gap: 10 (ref 5–15)
BUN: 19 mg/dL (ref 6–20)
CALCIUM: 9.4 mg/dL (ref 8.9–10.3)
CHLORIDE: 105 mmol/L (ref 101–111)
CO2: 25 mmol/L (ref 22–32)
CREATININE: 0.86 mg/dL (ref 0.44–1.00)
Glucose, Bld: 199 mg/dL — ABNORMAL HIGH (ref 65–99)
Potassium: 4 mmol/L (ref 3.5–5.1)
SODIUM: 140 mmol/L (ref 135–145)

## 2017-06-14 LAB — TROPONIN I

## 2017-06-14 NOTE — ED Notes (Signed)
Pt given ginerale.  

## 2017-06-14 NOTE — Discharge Instructions (Signed)
Continue all current medications as directed by your doctor. Return to the ER for worsening symptoms, persistent vomiting, difficulty breathing or other concerns.

## 2017-06-14 NOTE — ED Provider Notes (Signed)
[facility] Emergency Department Provider Note   ____________________________________________   First MD Initiated Contact with Patient 06/14/17 0155     (approximate)  I have reviewed the triage vital signs and the nursing notes.   HISTORY  Chief Complaint Chest Pain    HPI Betty Luna is a 66 y.o. female who presents to the ED from home with a chief complaint of chest pain and anxiety. Patient reports history of similar. Patient has a history of diabetes, hypertension, congestive heart failure. No prior history of CAD. States she was at her sister's house tonight, playing a game and got "too excited". Left her anxiety medicine at home.She did take a baby aspirin but her symptoms were already subsided by the time she took the aspirin. States she often have these "spells". Denies recent fever, chills, shortness of breath, diaphoresis, dizziness, abdominal pain, nausea, vomiting. Denies recent travel or trauma. Currently patient denies chest pain.   Past Medical History:  Diagnosis Date  . Anemia   . Arthritis   . CHF (congestive heart failure) (HCC)   . Depression   . Diabetes mellitus without complication (HCC)   . GERD (gastroesophageal reflux disease)   . Gout   . Hypertension     There are no active problems to display for this patient.   Past Surgical History:  Procedure Laterality Date  . ABDOMINAL HYSTERECTOMY    . CATARACT EXTRACTION W/PHACO Right 04/16/2017   Procedure: CATARACT EXTRACTION PHACO AND INTRAOCULAR LENS PLACEMENT (IOC);  Surgeon: Galen Manila, MD;  Location: [facility] ORS;  Service: Ophthalmology;  Laterality: Right;  [place] 01:06 AP% 16.5 CDE 11.03 Fluid pack lot # [id number]  . CTR Left   . TONSILLECTOMY      Prior to Admission medications   Medication Sig Start Date End Date Taking? Authorizing Provider  aspirin EC 81 MG tablet Take 81 mg by mouth daily.    [provider]  carvedilol (COREG) 25 MG tablet  Take 1 tablet by mouth 2 (two) times daily. 02/14/15   [provider]  Insulin Lispro Prot & Lispro (HUMALOG MIX 75/25 KWIKPEN) (75-25) 100 UNIT/ML Kwikpen Inject 25 Units into the skin at bedtime.     [provider]  JANUVIA 100 MG tablet Take 100 mg by mouth daily. 03/04/17   [provider]  losartan (COZAAR) 50 MG tablet Take 1 tablet by mouth daily. 01/23/15   [provider]  metFORMIN (GLUCOPHAGE-XR) 500 MG 24 hr tablet Take 1,000 mg by mouth 2 (two) times daily.     [provider]  pantoprazole (PROTONIX) 40 MG tablet Take 40 mg by mouth daily. 03/07/17   [provider]  rosuvastatin (CRESTOR) 20 MG tablet Take 20 mg by mouth daily. 05/29/17   [provider]  spironolactone (ALDACTONE) 25 MG tablet Take 25 mg by mouth daily.    [provider]  torsemide (DEMADEX) 10 MG tablet Take 1 tablet by mouth daily. 03/23/15   [provider]    Allergies Codeine  Family history Patient does not know  Social History Social History  Substance Use Topics  . Smoking status: Former Games developer  . Smokeless tobacco: Never Used  . Alcohol use Yes     Comment: occasonally  Chews snuff  Review of Systems  Constitutional: No fever/chills. Eyes: No visual changes. ENT: No sore throat. Cardiovascular: Positive for chest pain. Respiratory: Denies shortness of breath. Gastrointestinal: No abdominal pain.  No nausea, no vomiting.  No diarrhea.  No constipation. Genitourinary: Negative for dysuria. Musculoskeletal: Negative for back pain. Skin: Negative for rash. Neurological: Negative for headaches, focal weakness or numbness. Psychiatric: Positive for anxiety.  ____________________________________________   PHYSICAL EXAM:  VITAL SIGNS: ED Triage Vitals  Enc Vitals Group     BP 06/13/17 2348 (!) 160/80     Pulse Rate 06/13/17 2348 92     Resp 06/13/17 2348 18     Temp 06/13/17 2348 (!) 97.5 F (36.4 C)      Temp Source 06/13/17 2348 Oral     SpO2 06/13/17 2348 98 %     Weight 06/13/17 2348 224 lb (101.6 kg)     Height 06/13/17 2348 5\' 2"  (1.575 m)     Head Circumference --      Peak Flow --      Pain Score 06/13/17 2347 7     Pain Loc --      Pain Edu? --      Excl. in GC? --     Constitutional: Alert and oriented. Well appearing and in no acute distress. Laughing and talking with family member. Eyes: Conjunctivae are normal. PERRL. EOMI. Head: Atraumatic. Nose: No congestion/rhinnorhea. Mouth/Throat: Mucous membranes are moist.  Oropharynx non-erythematous. Neck: No stridor.  No carotid bruits. Cardiovascular: Normal rate, regular rhythm. Grossly normal heart sounds.  Good peripheral circulation. Respiratory: Normal respiratory effort.  No retractions. Lungs CTAB. Gastrointestinal: Soft and nontender. No distention. No abdominal bruits. No CVA tenderness. Musculoskeletal: No lower extremity tenderness nor edema.  No joint effusions. Neurologic:  Normal speech and language. No gross focal neurologic deficits are appreciated. No gait instability. Skin:  Skin is warm, dry and intact. No rash noted. Psychiatric: Mood and affect are normal. Speech and behavior are normal.  ____________________________________________   LABS (all labs ordered are listed, but only abnormal results are displayed)  Labs Reviewed  BASIC METABOLIC PANEL - Abnormal; Notable for the following:       Result Value   Glucose, Bld 199 (*)    All other components within normal limits  CBC - Abnormal; Notable for the following:    Hemoglobin 11.6 (*)    HCT 34.7 (*)    MCV 72.3 (*)    MCH 24.2 (*)    RDW 16.3 (*)    All other components within normal limits  TROPONIN I  TROPONIN I   ____________________________________________  EKG  ED ECG REPORT I, Makayla Lanter J, the attending physician, personally viewed and interpreted this ECG.   Date: 06/14/2017  EKG Time: 2347  Rate: 86  Rhythm: normal EKG,  normal sinus rhythm  Axis: normal  Intervals:none  ST&T Change: nonspecific  ____________________________________________  RADIOLOGY  Dg Chest 2 View  Result Date: 06/14/2017 CLINICAL DATA:  Chest pain. EXAM: CHEST  2 VIEW COMPARISON:  Radiograph of March 23, 2017. FINDINGS: The heart size and mediastinal contours are within normal limits. Both lungs are clear. Atherosclerosis of thoracic aorta is noted. The visualized skeletal structures are unremarkable. IMPRESSION: No active cardiopulmonary disease. Electronically Signed   By: Lupita Raider, M.D.   On: 06/14/2017 00:13    ____________________________________________   PROCEDURES  Procedure(s) performed: None  Procedures  Critical Care performed: No  ____________________________________________   INITIAL IMPRESSION / ASSESSMENT AND PLAN / ED COURSE  Pertinent labs & imaging results that were available during my care of the patient were reviewed by me and considered in my medical decision making (see chart for details).  66 year old female with CHF, diabetes, hypertension  who presents with chest pain and anxiety. Similar symptoms previously. Per patient report, she had a negative stress test last year. Initial EKG and troponin unremarkable. Patient is chest pain-free at this time. She would be a good candidate for repeat, timed troponin.  ----------------------------------------- 2:13 AM on 06/14/2017 -----------------------------------------   OBSERVATION CARE: This patient is being placed under observation care for the following reasons: Chest pain with repeat testing to rule out ischemia   Clinical Course as of Jun 14 322  Fri Jun 14, 2017  1610 Patient resting in no acute distress. Repeat troponin remains negative. Low suspicion for ACS, PE, dissection. Will refer patient to cardiology for outpatient follow-up. Strict return precautions given. Patient and family member verbalize understanding and agree with plan of  care.  [JS]    Clinical Course User Index [JS] Irean Hong, MD    ----------------------------------------- 3:23 AM on 06/14/2017 -----------------------------------------   END OF OBSERVATION STATUS: After an appropriate period of observation, this patient is being discharged due to the following reason(s):  Negative repeat troponin; low risk for acute ACS.  ____________________________________________   FINAL CLINICAL IMPRESSION(S) / ED DIAGNOSES  Final diagnoses:  Chest pain, unspecified type  Anxiety      NEW MEDICATIONS STARTED DURING THIS VISIT:  New Prescriptions   No medications on file     Note:  This document was prepared using Dragon voice recognition software and may include unintentional dictation errors.    Irean Hong, MD 06/14/17 [phone]

## 2017-06-26 ENCOUNTER — Emergency Department: Payer: Medicare Other

## 2017-06-26 ENCOUNTER — Encounter: Payer: Self-pay | Admitting: Emergency Medicine

## 2017-06-26 ENCOUNTER — Inpatient Hospital Stay
Admission: EM | Admit: 2017-06-26 | Discharge: 2017-06-27 | DRG: 872 | Disposition: A | Discharge status: Home / Self Care | Payer: Medicare Other | Attending: Specialist | Admitting: Specialist

## 2017-06-26 DIAGNOSIS — E785 Hyperlipidemia, unspecified: Secondary | ICD-10-CM | POA: Diagnosis present

## 2017-06-26 DIAGNOSIS — I110 Hypertensive heart disease with heart failure: Secondary | ICD-10-CM | POA: Diagnosis present

## 2017-06-26 DIAGNOSIS — N3001 Acute cystitis with hematuria: Secondary | ICD-10-CM

## 2017-06-26 DIAGNOSIS — Z7982 Long term (current) use of aspirin: Secondary | ICD-10-CM

## 2017-06-26 DIAGNOSIS — N10 Acute tubulo-interstitial nephritis: Secondary | ICD-10-CM | POA: Diagnosis not present

## 2017-06-26 DIAGNOSIS — Z885 Allergy status to narcotic agent status: Secondary | ICD-10-CM

## 2017-06-26 DIAGNOSIS — A419 Sepsis, unspecified organism: Secondary | ICD-10-CM | POA: Diagnosis not present

## 2017-06-26 DIAGNOSIS — M109 Gout, unspecified: Secondary | ICD-10-CM | POA: Diagnosis not present

## 2017-06-26 DIAGNOSIS — K219 Gastro-esophageal reflux disease without esophagitis: Secondary | ICD-10-CM | POA: Diagnosis present

## 2017-06-26 DIAGNOSIS — Z87440 Personal history of urinary (tract) infections: Secondary | ICD-10-CM | POA: Diagnosis not present

## 2017-06-26 DIAGNOSIS — E119 Type 2 diabetes mellitus without complications: Secondary | ICD-10-CM | POA: Diagnosis not present

## 2017-06-26 DIAGNOSIS — Z7984 Long term (current) use of oral hypoglycemic drugs: Secondary | ICD-10-CM | POA: Diagnosis not present

## 2017-06-26 DIAGNOSIS — D649 Anemia, unspecified: Secondary | ICD-10-CM | POA: Diagnosis not present

## 2017-06-26 DIAGNOSIS — M1990 Unspecified osteoarthritis, unspecified site: Secondary | ICD-10-CM | POA: Diagnosis present

## 2017-06-26 DIAGNOSIS — I5022 Chronic systolic (congestive) heart failure: Secondary | ICD-10-CM | POA: Diagnosis present

## 2017-06-26 DIAGNOSIS — Z961 Presence of intraocular lens: Secondary | ICD-10-CM | POA: Diagnosis not present

## 2017-06-26 DIAGNOSIS — Z794 Long term (current) use of insulin: Secondary | ICD-10-CM

## 2017-06-26 DIAGNOSIS — N200 Calculus of kidney: Secondary | ICD-10-CM

## 2017-06-26 DIAGNOSIS — Z9841 Cataract extraction status, right eye: Secondary | ICD-10-CM

## 2017-06-26 DIAGNOSIS — Z811 Family history of alcohol abuse and dependence: Secondary | ICD-10-CM | POA: Diagnosis not present

## 2017-06-26 DIAGNOSIS — Z9842 Cataract extraction status, left eye: Secondary | ICD-10-CM

## 2017-06-26 DIAGNOSIS — N12 Tubulo-interstitial nephritis, not specified as acute or chronic: Secondary | ICD-10-CM | POA: Diagnosis present

## 2017-06-26 LAB — URINALYSIS, COMPLETE (UACMP) WITH MICROSCOPIC
Bilirubin Urine: NEGATIVE
Glucose, UA: 150 mg/dL — AB
Ketones, ur: NEGATIVE mg/dL
Nitrite: POSITIVE — AB
Protein, ur: NEGATIVE mg/dL
SPECIFIC GRAVITY, URINE: 1.015 (ref 1.005–1.030)
pH: 6 (ref 5.0–8.0)

## 2017-06-26 LAB — BASIC METABOLIC PANEL
ANION GAP: 9 (ref 5–15)
BUN: 14 mg/dL (ref 6–20)
CALCIUM: 9.2 mg/dL (ref 8.9–10.3)
CHLORIDE: 101 mmol/L (ref 101–111)
CO2: 26 mmol/L (ref 22–32)
Creatinine, Ser: 0.81 mg/dL (ref 0.44–1.00)
GFR calc Af Amer: >60 mL/min (ref >60)
Glucose, Bld: 144 mg/dL — ABNORMAL HIGH (ref 65–99)
POTASSIUM: 3.7 mmol/L (ref 3.5–5.1)
Sodium: 136 mmol/L (ref 135–145)

## 2017-06-26 LAB — LACTIC ACID, PLASMA
LACTIC ACID, VENOUS: 2 mmol/L — AB (ref 0.5–1.9)
Lactic Acid, Venous: 2.6 mmol/L (ref 0.5–1.9)

## 2017-06-26 LAB — HEMOGLOBIN A1C
HEMOGLOBIN A1C: 7.4 % — AB (ref 4.8–5.6)
MEAN PLASMA GLUCOSE: 165.68 mg/dL

## 2017-06-26 LAB — CBC
HEMATOCRIT: 32.7 % — AB (ref 35.0–47.0)
HEMOGLOBIN: 10.9 g/dL — AB (ref 12.0–16.0)
MCH: 23.8 pg — ABNORMAL LOW (ref 26.0–34.0)
MCHC: 33.4 g/dL (ref 32.0–36.0)
MCV: 71.3 fL — AB (ref 80.0–100.0)
PLATELETS: 209 10*3/uL (ref 150–440)
RBC: 4.58 MIL/uL (ref 3.80–5.20)
RDW: 16 % — ABNORMAL HIGH (ref 11.5–14.5)
WBC: 9.3 10*3/uL (ref 3.6–11.0)

## 2017-06-26 LAB — GLUCOSE, CAPILLARY
GLUCOSE-CAPILLARY: 147 mg/dL — AB (ref 65–99)
Glucose-Capillary: 163 mg/dL — ABNORMAL HIGH (ref 65–99)
Glucose-Capillary: 221 mg/dL — ABNORMAL HIGH (ref 65–99)

## 2017-06-26 MED ORDER — SODIUM CHLORIDE 0.9 % IV BOLUS (SEPSIS)
1000.0000 mL | Freq: Once | INTRAVENOUS | Status: AC
  Administered 2017-06-26: 1000 mL via INTRAVENOUS

## 2017-06-26 MED ORDER — ROSUVASTATIN CALCIUM 10 MG PO TABS
20.0000 mg | ORAL_TABLET | Freq: Every day | ORAL | Status: DC
  Administered 2017-06-27: 20 mg via ORAL
  Filled 2017-06-26: qty 2

## 2017-06-26 MED ORDER — KETOROLAC TROMETHAMINE 30 MG/ML IJ SOLN
30.0000 mg | Freq: Four times a day (QID) | INTRAMUSCULAR | Status: DC | PRN
  Administered 2017-06-26 – 2017-06-27 (×2): 30 mg via INTRAVENOUS
  Filled 2017-06-26 (×2): qty 1

## 2017-06-26 MED ORDER — PHENAZOPYRIDINE HCL 100 MG PO TABS: 100.0000 mg | ORAL_TABLET | Freq: Three times a day (TID) | ORAL | 0 refills | Status: DC | PRN

## 2017-06-26 MED ORDER — ACETAMINOPHEN 650 MG RE SUPP: 650.0000 mg | Freq: Four times a day (QID) | RECTAL | Status: DC | PRN

## 2017-06-26 MED ORDER — DEXTROSE 5 % IV SOLN
1.0000 g | Freq: Once | INTRAVENOUS | Status: AC
  Administered 2017-06-26: 1 g via INTRAVENOUS
  Filled 2017-06-26: qty 10

## 2017-06-26 MED ORDER — IBUPROFEN 800 MG PO TABS: 800.0000 mg | ORAL_TABLET | Freq: Once | ORAL | Status: DC

## 2017-06-26 MED ORDER — ONDANSETRON HCL 4 MG/2ML IJ SOLN: 4.0000 mg | Freq: Four times a day (QID) | INTRAMUSCULAR | Status: DC | PRN

## 2017-06-26 MED ORDER — INSULIN ASPART 100 UNIT/ML ~~LOC~~ SOLN: 0.0000 [IU] | Freq: Every day | SUBCUTANEOUS | Status: DC

## 2017-06-26 MED ORDER — ACETAMINOPHEN 325 MG PO TABS: 650.0000 mg | ORAL_TABLET | Freq: Four times a day (QID) | ORAL | Status: DC | PRN

## 2017-06-26 MED ORDER — OXYCODONE HCL 5 MG PO TABS: 5.0000 mg | ORAL_TABLET | ORAL | Status: DC | PRN

## 2017-06-26 MED ORDER — INSULIN ASPART PROT & ASPART (70-30 MIX) 100 UNIT/ML ~~LOC~~ SUSP
25.0000 [IU] | Freq: Every day | SUBCUTANEOUS | Status: DC
  Administered 2017-06-26: 25 [IU] via SUBCUTANEOUS
  Filled 2017-06-26: qty 10

## 2017-06-26 MED ORDER — INSULIN LISPRO PROT & LISPRO (75-25 MIX) 100 UNIT/ML KWIKPEN: 25.0000 [IU] | PEN_INJECTOR | Freq: Every day | SUBCUTANEOUS | Status: DC

## 2017-06-26 MED ORDER — INSULIN ASPART 100 UNIT/ML ~~LOC~~ SOLN
0.0000 [IU] | Freq: Three times a day (TID) | SUBCUTANEOUS | Status: DC
  Administered 2017-06-26: 5 [IU] via SUBCUTANEOUS
  Administered 2017-06-27: 3 [IU] via SUBCUTANEOUS
  Filled 2017-06-26 (×2): qty 1

## 2017-06-26 MED ORDER — ACETAMINOPHEN 325 MG PO TABS
650.0000 mg | ORAL_TABLET | Freq: Once | ORAL | Status: AC
  Administered 2017-06-26: 650 mg via ORAL
  Filled 2017-06-26: qty 2

## 2017-06-26 MED ORDER — ONDANSETRON HCL 4 MG PO TABS: 4.0000 mg | ORAL_TABLET | Freq: Four times a day (QID) | ORAL | Status: DC | PRN

## 2017-06-26 MED ORDER — CARVEDILOL 25 MG PO TABS
25.0000 mg | ORAL_TABLET | Freq: Two times a day (BID) | ORAL | Status: DC
  Administered 2017-06-26 – 2017-06-27 (×2): 25 mg via ORAL
  Filled 2017-06-26 (×2): qty 1

## 2017-06-26 MED ORDER — DEXTROSE 5 % IV SOLN
1.0000 g | Freq: Once | INTRAVENOUS | Status: AC
  Administered 2017-06-27: 1 g via INTRAVENOUS
  Filled 2017-06-26: qty 10

## 2017-06-26 MED ORDER — POLYETHYLENE GLYCOL 3350 17 G PO PACK: 17.0000 g | PACK | Freq: Every day | ORAL | Status: DC | PRN

## 2017-06-26 MED ORDER — METFORMIN HCL ER 500 MG PO TB24
1000.0000 mg | ORAL_TABLET | Freq: Two times a day (BID) | ORAL | Status: DC
  Administered 2017-06-26 – 2017-06-27 (×2): 1000 mg via ORAL
  Filled 2017-06-26 (×3): qty 2

## 2017-06-26 MED ORDER — LOSARTAN POTASSIUM 50 MG PO TABS
50.0000 mg | ORAL_TABLET | Freq: Every day | ORAL | Status: DC
  Administered 2017-06-26 – 2017-06-27 (×2): 50 mg via ORAL
  Filled 2017-06-26 (×2): qty 1

## 2017-06-26 MED ORDER — ENOXAPARIN SODIUM 40 MG/0.4ML ~~LOC~~ SOLN
40.0000 mg | SUBCUTANEOUS | Status: DC
  Administered 2017-06-26: 40 mg via SUBCUTANEOUS
  Filled 2017-06-26: qty 0.4

## 2017-06-26 MED ORDER — CEPHALEXIN 500 MG PO CAPS: 500.0000 mg | ORAL_CAPSULE | Freq: Four times a day (QID) | ORAL | 0 refills | Status: DC

## 2017-06-26 MED ORDER — IBUPROFEN 600 MG PO TABS
600.0000 mg | ORAL_TABLET | Freq: Once | ORAL | Status: AC
  Administered 2017-06-26: 600 mg via ORAL
  Filled 2017-06-26: qty 1

## 2017-06-26 MED ORDER — PANTOPRAZOLE SODIUM 40 MG PO TBEC
40.0000 mg | DELAYED_RELEASE_TABLET | Freq: Every day | ORAL | Status: DC
  Administered 2017-06-26 – 2017-06-27 (×2): 40 mg via ORAL
  Filled 2017-06-26 (×2): qty 1

## 2017-06-26 MED ORDER — ASPIRIN EC 81 MG PO TBEC
81.0000 mg | DELAYED_RELEASE_TABLET | Freq: Every day | ORAL | Status: DC
  Administered 2017-06-26 – 2017-06-27 (×2): 81 mg via ORAL
  Filled 2017-06-26 (×2): qty 1

## 2017-06-26 NOTE — ED Notes (Signed)
ED Provider at bedside. 

## 2017-06-26 NOTE — Progress Notes (Signed)
Lab reported lactic acid of 2.6.  Dr Elpidio Anis notified.  NS bolus ordered.  MD aware that this is a total of that the patient has received today

## 2017-06-26 NOTE — ED Triage Notes (Addendum)
Pt to triage via w/c with no distress noted; c/o lower back pain radiating into legs "all week", worse since last night; denies any known injury; denies any urinary c/o

## 2017-06-26 NOTE — ED Notes (Signed)
Admitting MD at bedside. PT has been given meal tray and is resting in bed at this time. Pt in NAD.

## 2017-06-26 NOTE — ED Provider Notes (Signed)
[facility] Emergency Department Provider Note  ____________________________________________  Time seen: Approximately 7:16 AM  I have reviewed the triage vital signs and the nursing notes.   HISTORY  Chief Complaint Back Pain    HPI Betty Luna is a 66 y.o. female that presents to the emergency department with back pain and urinary frequency for one week. This feels similar to urinary tract infections in the past but this is worse. She believes she had a fever last night but not check her temperature. She gets frequent urinary tract infections and last one was 2 months ago. She has never had a kidney stone that she knows of. She is not allergic to any antibiotics. She denies nausea, vomiting, dysuria, urgency hematuria.   Past Medical History:  Diagnosis Date  . Anemia   . Arthritis   . CHF (congestive heart failure) (HCC)   . Depression   . Diabetes mellitus without complication (HCC)   . GERD (gastroesophageal reflux disease)   . Gout   . Hypertension     Patient Active Problem List   Diagnosis Date Noted  . Pyelonephritis 06/26/2017    Past Surgical History:  Procedure Laterality Date  . ABDOMINAL HYSTERECTOMY    . CATARACT EXTRACTION W/PHACO Right 04/16/2017   Procedure: CATARACT EXTRACTION PHACO AND INTRAOCULAR LENS PLACEMENT (IOC);  Surgeon: Galen Manila, MD;  Location: [facility] ORS;  Service: Ophthalmology;  Laterality: Right;  [place] 01:06 AP% 16.5 CDE 11.03 Fluid pack lot # [id number]  . CTR Left   . TONSILLECTOMY      Prior to Admission medications   Medication Sig Start Date End Date Taking? Authorizing Provider  aspirin EC 81 MG tablet Take 81 mg by mouth daily.   Yes [provider]  pantoprazole (PROTONIX) 40 MG tablet Take 40 mg by mouth daily. 03/07/17  Yes [provider]  carvedilol (COREG) 25 MG tablet Take 1 tablet by mouth 2 (two) times daily. 02/14/15   [provider]  Insulin Lispro Prot &  Lispro (HUMALOG MIX 75/25 KWIKPEN) (75-25) 100 UNIT/ML Kwikpen Inject 25 Units into the skin at bedtime.     [provider]  JANUVIA 100 MG tablet Take 100 mg by mouth daily. 03/04/17   [provider]  losartan (COZAAR) 50 MG tablet Take 1 tablet by mouth daily. 01/23/15   [provider]  metFORMIN (GLUCOPHAGE-XR) 500 MG 24 hr tablet Take 1,000 mg by mouth 2 (two) times daily.     [provider]  rosuvastatin (CRESTOR) 20 MG tablet Take 20 mg by mouth daily. 05/29/17   [provider]  spironolactone (ALDACTONE) 25 MG tablet Take 25 mg by mouth daily.    [provider]  torsemide (DEMADEX) 10 MG tablet Take 1 tablet by mouth daily. 03/23/15   [provider]    Allergies Codeine  Family History  Problem Relation Age of Onset  . Alcohol abuse Brother     Social History Social History  Substance Use Topics  . Smoking status: Former Games developer  . Smokeless tobacco: Never Used  . Alcohol use Yes     Comment: occasonally     Review of Systems  Cardiovascular: No chest pain. Respiratory: No SOB. Gastrointestinal:  No nausea, no vomiting.  Genitourinary: Negative for dysuria. Skin: Negative for rash, abrasions, lacerations, ecchymosis.   ____________________________________________   PHYSICAL EXAM:  VITAL SIGNS: ED Triage Vitals [06/26/17 0632]  Enc Vitals Group     BP 138/75     Pulse  Rate 100     Resp 20     Temp 98.7 F (37.1 C)     Temp Source Oral     SpO2 97 %     Weight 224 lb (101.6 kg)     Height 5\' 2"  (1.575 m)     Head Circumference      Peak Flow      Pain Score 9     Pain Loc      Pain Edu?      Excl. in GC?      Constitutional: Alert and oriented. Uncomfortable. Eyes: Conjunctivae are normal. PERRL. EOMI. Head: Atraumatic. ENT:      Ears:      Nose: No congestion/rhinnorhea.      Mouth/Throat: Mucous membranes are moist.  Neck: No stridor.   Cardiovascular: Normal rate, regular rhythm.   Good peripheral circulation. Respiratory: Normal respiratory effort without tachypnea or retractions. Lungs CTAB. Good air entry to the bases with no decreased or absent breath sounds. Gastrointestinal: Bowel sounds 4 quadrants. Suprapubic tenderness. No guarding or rigidity. No palpable masses. No distention. No CVA tenderness. Musculoskeletal: Full range of motion to all extremities. No gross deformities appreciated. Neurologic:  Normal speech and language. No gross focal neurologic deficits are appreciated.  Skin:  Skin is warm, dry and intact. No rash noted.   ____________________________________________   LABS (all labs ordered are listed, but only abnormal results are displayed)  Labs Reviewed  URINALYSIS, COMPLETE (UACMP) WITH MICROSCOPIC - Abnormal; Notable for the following:       Result Value   Color, Urine YELLOW (*)    APPearance CLEAR (*)    Glucose, UA 150 (*)    Hgb urine dipstick MODERATE (*)    Nitrite POSITIVE (*)    Leukocytes, UA LARGE (*)    Bacteria, UA MANY (*)    Squamous Epithelial / LPF 0-5 (*)    All other components within normal limits  CBC - Abnormal; Notable for the following:    Hemoglobin 10.9 (*)    HCT 32.7 (*)    MCV 71.3 (*)    MCH 23.8 (*)    RDW 16.0 (*)    All other components within normal limits  BASIC METABOLIC PANEL - Abnormal; Notable for the following:    Glucose, Bld 144 (*)    All other components within normal limits  LACTIC ACID, PLASMA - Abnormal; Notable for the following:    Lactic Acid, Venous 2.0 (*)    All other components within normal limits  GLUCOSE, CAPILLARY - Abnormal; Notable for the following:    Glucose-Capillary 163 (*)    All other components within normal limits  URINE CULTURE  CULTURE, BLOOD (ROUTINE X 2)  CULTURE, BLOOD (ROUTINE X 2)  LACTIC ACID, PLASMA  HIV ANTIBODY (ROUTINE TESTING)  HEMOGLOBIN A1C    ____________________________________________  EKG   ____________________________________________  RADIOLOGY  Ct Renal Stone Study  Result Date: 06/26/2017 CLINICAL DATA:  Low back pain radiating to legs. EXAM: CT ABDOMEN AND PELVIS WITHOUT CONTRAST TECHNIQUE: Multidetector CT imaging of the abdomen and pelvis was performed following the standard protocol without IV contrast. COMPARISON:  03/24/2017 FINDINGS: Lower chest: No acute abnormality. Hepatobiliary: Fatty infiltration of the liver. No focal abnormality or biliary ductal dilatation. Gallbladder unremarkable. Pancreas: No focal abnormality or ductal dilatation. Spleen: No focal abnormality.  Normal size. Adrenals/Urinary Tract: 4 mm nonobstructing stone in the lower pole of the left kidney. No ureteral stones or hydronephrosis. Adrenal glands and urinary  bladder unremarkable. Stomach/Bowel: Stomach, large and small bowel grossly unremarkable. Vascular/Lymphatic: Aortic and iliac calcifications. No aneurysm or adenopathy. Reproductive: Prior hysterectomy.  No adnexal masses. Other: No free fluid or free air. Musculoskeletal: Diffuse degenerative facet disease in the lower lumbar spine. IMPRESSION: Left lower pole nephrolithiasis. No ureteral stones or hydronephrosis. Fatty infiltration of the liver. Aortoiliac atherosclerosis. No acute findings in the abdomen or pelvis. Electronically Signed   By: Charlett Nose M.D.   On: 06/26/2017 08:22    ____________________________________________    PROCEDURES  Procedure(s) performed:    Procedures    Medications  sodium chloride 0.9 % bolus 1,000 mL (1,000 mLs Intravenous New Bag/Given 06/26/17 1452)  cefTRIAXone (ROCEPHIN) 1 g in dextrose 5 % 50 mL IVPB (not administered)  insulin aspart (novoLOG) injection 0-15 Units (not administered)  insulin aspart (novoLOG) injection 0-5 Units (not administered)  enoxaparin (LOVENOX) injection 40 mg (40 mg Subcutaneous Given 06/26/17 1440)   acetaminophen (TYLENOL) tablet 650 mg (not administered)    Or  acetaminophen (TYLENOL) suppository 650 mg (not administered)  oxyCODONE (Oxy IR/ROXICODONE) immediate release tablet 5 mg (not administered)  ketorolac (TORADOL) 30 MG/ML injection 30 mg (not administered)  polyethylene glycol (MIRALAX / GLYCOLAX) packet 17 g (not administered)  ondansetron (ZOFRAN) tablet 4 mg (not administered)    Or  ondansetron (ZOFRAN) injection 4 mg (not administered)  metFORMIN (GLUCOPHAGE-XR) 24 hr tablet 1,000 mg (not administered)  aspirin EC tablet 81 mg (not administered)  pantoprazole (PROTONIX) EC tablet 40 mg (not administered)  rosuvastatin (CRESTOR) tablet 20 mg (not administered)  carvedilol (COREG) tablet 25 mg (not administered)  losartan (COZAAR) tablet 50 mg (not administered)  insulin aspart protamine- aspart (NOVOLOG MIX 70/30) injection 25 Units (not administered)  acetaminophen (TYLENOL) tablet 650 mg (650 mg Oral Given 06/26/17 0815)  cefTRIAXone (ROCEPHIN) 1 g in dextrose 5 % 50 mL IVPB (0 g Intravenous Stopped 06/26/17 0957)  sodium chloride 0.9 % bolus 1,000 mL (0 mLs Intravenous Stopped 06/26/17 1238)  ibuprofen (ADVIL,MOTRIN) tablet 600 mg (600 mg Oral Given 06/26/17 1041)     ____________________________________________   INITIAL IMPRESSION / ASSESSMENT AND PLAN / ED COURSE  Pertinent labs & imaging results that were available during my care of the patient were reviewed by me and considered in my medical decision making (see chart for details).  Review of the  [place] was performed in accordance of the [place] prior to dispensing any controlled drugs.   Patient presented to the emergency department for evaluation of back pain and urinary urgency for one week. Patient arrived to the ED very uncomfortable. Urinalysis consistent with urinary tract infection. CT renal stone shows a nonobstructing stone in the left kidney. Temperature spiked to 103 and patient was tachycardic. No white  blood cell count. Lactic acid remarkable at 2.0. Patient was given IV ceftriaxone and IV fluids. Patient meets septic criteria. Dr. Mayford Knife was consulted and recommended admission. She will be admitted for sepsis and urinary tract infection.     ____________________________________________  FINAL CLINICAL IMPRESSION(S) / ED DIAGNOSES  Final diagnoses:  Acute cystitis with hematuria  Nephrolithiasis      NEW MEDICATIONS STARTED DURING THIS VISIT:  Current Discharge Medication List          This chart was dictated using voice recognition software/Dragon. Despite best efforts to proofread, errors can occur which can change the meaning. Any change was purely unintentional.    Enid Derry, PA-C 06/26/17 1544    Willy Eddy, MD 06/26/17 [phone]

## 2017-06-26 NOTE — ED Notes (Signed)
Patient transported to CT 

## 2017-06-26 NOTE — H&P (Signed)
[facility] - [place] at [facility]   PATIENT NAME: Betty Luna    MR#:  [id number]  DATE OF BIRTH:  01/05/1951  DATE OF ADMISSION:  06/26/2017  PRIMARY CARE PHYSICIAN: Lauro Regulus, MD   REQUESTING/REFERRING PHYSICIAN: Dr. Mayford Knife  CHIEF COMPLAINT:   Chief Complaint  Patient presents with  . Back Pain    HISTORY OF PRESENT ILLNESS:  Betty Luna  is a 66 y.o. female with a known history of Diabetes, hypertension, chronic systolic CHF, recurrent UTIs, presents to the emergency room complaining of acute onset of back pain and fever since yesterday. She was recently treated for UTI with amoxicillin by her primary care physician and of July. Here patient has been found to have UTI with sepsis. Elevated WBC, temperature 103. Lactic acid 2. CT scan shows nonobstructive 4 mm left renal lower pole stone.  PAST MEDICAL HISTORY:   Past Medical History:  Diagnosis Date  . Anemia   . Arthritis   . CHF (congestive heart failure) (HCC)   . Depression   . Diabetes mellitus without complication (HCC)   . GERD (gastroesophageal reflux disease)   . Gout   . Hypertension     PAST SURGICAL HISTORY:   Past Surgical History:  Procedure Laterality Date  . ABDOMINAL HYSTERECTOMY    . CATARACT EXTRACTION W/PHACO Right 04/16/2017   Procedure: CATARACT EXTRACTION PHACO AND INTRAOCULAR LENS PLACEMENT (IOC);  Surgeon: Galen Manila, MD;  Location: [facility] ORS;  Service: Ophthalmology;  Laterality: Right;  [place] 01:06 AP% 16.5 CDE 11.03 Fluid pack lot # [id number]  . CTR Left   . TONSILLECTOMY      SOCIAL HISTORY:   Social History  Substance Use Topics  . Smoking status: Former Games developer  . Smokeless tobacco: Never Used  . Alcohol use Yes     Comment: occasonally    FAMILY HISTORY:   Family History  Problem Relation Age of Onset  . Alcohol abuse Brother     DRUG ALLERGIES:   Allergies  Allergen Reactions  . Codeine Other (See Comments)    Reaction:  jittery     REVIEW OF SYSTEMS:   Review of Systems  Constitutional: Positive for chills, fever and malaise/fatigue. Negative for weight loss.  HENT: Negative for hearing loss and nosebleeds.   Eyes: Negative for blurred vision, double vision and pain.  Respiratory: Negative for cough, hemoptysis, sputum production, shortness of breath and wheezing.   Cardiovascular: Negative for chest pain, palpitations, orthopnea and leg swelling.  Gastrointestinal: Negative for abdominal pain, constipation, diarrhea, nausea and vomiting.  Genitourinary: Negative for dysuria and hematuria.  Musculoskeletal: Positive for back pain. Negative for falls and myalgias.  Skin: Negative for rash.  Neurological: Positive for weakness. Negative for dizziness, tremors, sensory change, speech change, focal weakness, seizures and headaches.  Endo/Heme/Allergies: Does not bruise/bleed easily.  Psychiatric/Behavioral: Negative for depression and memory loss. The patient is not nervous/anxious.     MEDICATIONS AT HOME:   Prior to Admission medications   Medication Sig Start Date End Date Taking? Authorizing Provider  aspirin EC 81 MG tablet Take 81 mg by mouth daily.    [provider]  carvedilol (COREG) 25 MG tablet Take 1 tablet by mouth 2 (two) times daily. 02/14/15   [provider]  cephALEXin (KEFLEX) 500 MG capsule Take 1 capsule (500 mg total) by mouth 4 (four) times daily. 06/26/17 07/06/17  Enid Derry, PA-C  Insulin Lispro Prot & Lispro (HUMALOG MIX 75/25 KWIKPEN) (75-25) 100 UNIT/ML Stephanie Coup  Inject 25 Units into the skin at bedtime.     [provider]  JANUVIA 100 MG tablet Take 100 mg by mouth daily. 03/04/17   [provider]  losartan (COZAAR) 50 MG tablet Take 1 tablet by mouth daily. 01/23/15   [provider]  metFORMIN (GLUCOPHAGE-XR) 500 MG 24 hr tablet Take 1,000 mg by mouth 2 (two) times daily.     [provider]  pantoprazole (PROTONIX) 40 MG  tablet Take 40 mg by mouth daily. 03/07/17   [provider]  phenazopyridine (PYRIDIUM) 100 MG tablet Take 1 tablet (100 mg total) by mouth 3 (three) times daily as needed for pain. 06/26/17 06/28/17  Enid Derry, PA-C  rosuvastatin (CRESTOR) 20 MG tablet Take 20 mg by mouth daily. 05/29/17   [provider]  spironolactone (ALDACTONE) 25 MG tablet Take 25 mg by mouth daily.    [provider]  torsemide (DEMADEX) 10 MG tablet Take 1 tablet by mouth daily. 03/23/15   [provider]     VITAL SIGNS:  Blood pressure 128/67, pulse 100, temperature 98.2 F (36.8 C), resp. rate 20, height 5\' 2"  (1.575 m), weight 101.6 kg (224 lb), SpO2 98 %.  PHYSICAL EXAMINATION:  Physical Exam  GENERAL:  66 y.o.-year-old patient lying in the bed with no acute distress. Obese EYES: Pupils equal, round, reactive to light and accommodation. No scleral icterus. Extraocular muscles intact.  HEENT: Head atraumatic, normocephalic. Oropharynx and nasopharynx clear. No oropharyngeal erythema, moist oral mucosa  NECK:  Supple, no jugular venous distention. No thyroid enlargement, no tenderness.  LUNGS: Normal breath sounds bilaterally, no wheezing, rales, rhonchi. No use of accessory muscles of respiration.  CARDIOVASCULAR: S1, S2 normal. No murmurs, rubs, or gallops.  ABDOMEN: Soft, nontender, nondistended. Bowel sounds present. No organomegaly or mass.  Left CVA tenderness EXTREMITIES: No pedal edema, cyanosis, or clubbing. + 2 pedal & radial pulses b/l.   NEUROLOGIC: Cranial nerves II through XII are intact. No focal Motor or sensory deficits appreciated b/l PSYCHIATRIC: The patient is alert and oriented x 3. Good affect.  SKIN: No obvious rash, lesion, or ulcer.   LABORATORY PANEL:   CBC  Recent Labs Lab 06/26/17 0908  WBC 9.3  HGB 10.9*  HCT 32.7*  PLT 209    ------------------------------------------------------------------------------------------------------------------  Chemistries   Recent Labs Lab 06/26/17 0908  NA 136  K 3.7  CL 101  CO2 26  GLUCOSE 144*  BUN 14  CREATININE 0.81  CALCIUM 9.2   ------------------------------------------------------------------------------------------------------------------  Cardiac Enzymes No results for input(s): TROPONINI in the last 168 hours. ------------------------------------------------------------------------------------------------------------------  RADIOLOGY:  Ct Renal Stone Study  Result Date: 06/26/2017 CLINICAL DATA:  Low back pain radiating to legs. EXAM: CT ABDOMEN AND PELVIS WITHOUT CONTRAST TECHNIQUE: Multidetector CT imaging of the abdomen and pelvis was performed following the standard protocol without IV contrast. COMPARISON:  03/24/2017 FINDINGS: Lower chest: No acute abnormality. Hepatobiliary: Fatty infiltration of the liver. No focal abnormality or biliary ductal dilatation. Gallbladder unremarkable. Pancreas: No focal abnormality or ductal dilatation. Spleen: No focal abnormality.  Normal size. Adrenals/Urinary Tract: 4 mm nonobstructing stone in the lower pole of the left kidney. No ureteral stones or hydronephrosis. Adrenal glands and urinary bladder unremarkable. Stomach/Bowel: Stomach, large and small bowel grossly unremarkable. Vascular/Lymphatic: Aortic and iliac calcifications. No aneurysm or adenopathy. Reproductive: Prior hysterectomy.  No adnexal masses. Other: No free fluid or free air. Musculoskeletal: Diffuse degenerative facet disease in the lower lumbar spine. IMPRESSION: Left lower pole nephrolithiasis. No  ureteral stones or hydronephrosis. Fatty infiltration of the liver. Aortoiliac atherosclerosis. No acute findings in the abdomen or pelvis. Electronically Signed   By: Charlett Nose M.D.   On: 06/26/2017 08:22     IMPRESSION AND PLAN:   * Pyelonephritis  With sepsis Start IV ceftriaxone. Send blood cultures and urine cultures. Normal saline bolus stat. Repeat lactic acid in 3 hours. Recent urine cultures from her primary care physician's office have pansensitive Escherichia coli.  * Insulin-dependent diabetes mellitus. Sliding scale insulin. Continue home dose of insulin.  * Hypertension. Continue losartan and Coreg.  * Chronic systolic congestive heart failure. Ejection fraction of 45%. Hold torsemide and this time. Monitor for fluid overload. Continue Coreg and losartan.  * DVT prophylaxis with Lovenox   All the records are reviewed and case discussed with ED provider. Management plans discussed with the patient, family and they are in agreement.  CODE STATUS: FULL CODE  TOTAL TIME TAKING CARE OF THIS PATIENT: 40 minutes.   Milagros Loll R M.D on 06/26/2017 at 12:48 PM  Between 7am to 6pm - Pager - [phone]  After 6pm go to [email or web address] - password [email or web address] [facility]  [facility] [place] [facility]  Office  [phone]  CC: Primary care physician; Lauro Regulus, MD  Note: This dictation was prepared with Dragon dictation along with smaller phrase technology. Any transcriptional errors that result from this process are unintentional.

## 2017-06-26 NOTE — ED Notes (Signed)
The patient was given a cup of ice water.

## 2017-06-27 DIAGNOSIS — N200 Calculus of kidney: Secondary | ICD-10-CM | POA: Diagnosis not present

## 2017-06-27 DIAGNOSIS — A419 Sepsis, unspecified organism: Secondary | ICD-10-CM | POA: Diagnosis not present

## 2017-06-27 LAB — BASIC METABOLIC PANEL
Anion gap: 8 (ref 5–15)
BUN: 13 mg/dL (ref 6–20)
CO2: 24 mmol/L (ref 22–32)
Calcium: 8.3 mg/dL — ABNORMAL LOW (ref 8.9–10.3)
Chloride: 106 mmol/L (ref 101–111)
Creatinine, Ser: 0.63 mg/dL (ref 0.44–1.00)
GFR calc Af Amer: >60 mL/min (ref >60)
GLUCOSE: 140 mg/dL — AB (ref 65–99)
POTASSIUM: 3.6 mmol/L (ref 3.5–5.1)
Sodium: 138 mmol/L (ref 135–145)

## 2017-06-27 LAB — CBC
HEMATOCRIT: 29.5 % — AB (ref 35.0–47.0)
Hemoglobin: 9.9 g/dL — ABNORMAL LOW (ref 12.0–16.0)
MCH: 24.2 pg — AB (ref 26.0–34.0)
MCHC: 33.4 g/dL (ref 32.0–36.0)
MCV: 72.4 fL — ABNORMAL LOW (ref 80.0–100.0)
Platelets: 176 10*3/uL (ref 150–440)
RBC: 4.07 MIL/uL (ref 3.80–5.20)
RDW: 16.3 % — AB (ref 11.5–14.5)
WBC: 9.3 10*3/uL (ref 3.6–11.0)

## 2017-06-27 LAB — HIV ANTIBODY (ROUTINE TESTING W REFLEX): HIV Screen 4th Generation wRfx: NONREACTIVE

## 2017-06-27 LAB — GLUCOSE, CAPILLARY: GLUCOSE-CAPILLARY: 160 mg/dL — AB (ref 65–99)

## 2017-06-27 MED ORDER — CEFUROXIME AXETIL 250 MG PO TABS: 250.0000 mg | ORAL_TABLET | Freq: Two times a day (BID) | ORAL | 0 refills | Status: AC

## 2017-06-27 NOTE — Progress Notes (Signed)
Patient discharged to home as ordered, discharge instructions and follow up appointment given as ordered. IV discontinued site clean dry and intact. Patient is alert and oriented, ambulates without assistance, no a complaints of pain voiced at this time. Family at the bedside to take patient home

## 2017-06-27 NOTE — Discharge Summary (Signed)
[facility] - [place] at [facility]   PATIENT NAME: Betty Luna    MR#:  [id number]  DATE OF BIRTH:  September 09, 1951  DATE OF ADMISSION:  06/26/2017 ADMITTING PHYSICIAN: Milagros Loll, MD  DATE OF DISCHARGE: 06/27/2017 12:52 PM  PRIMARY CARE PHYSICIAN: Lauro Regulus, MD    ADMISSION DIAGNOSIS:  Nephrolithiasis [N20.0] Acute cystitis with hematuria [N30.01]  DISCHARGE DIAGNOSIS:  Active Problems:   Pyelonephritis   SECONDARY DIAGNOSIS:   Past Medical History:  Diagnosis Date  . Anemia   . Arthritis   . CHF (congestive heart failure) (HCC)   . Depression   . Diabetes mellitus without complication (HCC)   . GERD (gastroesophageal reflux disease)   . Gout   . Hypertension     HOSPITAL COURSE:   66 year old female with past medical history of hypertension, gout, diabetes, GERD, history of CHF, depression who presented to the hospital due to flank pain, fever and noted to have acute pyelonephritis.  1. Acute pyelonephritis-this is a cause of patient's flank pain and fever. Patient was treated with IV ceftriaxone, IV fluids and supportive care and has significantly improved. She is currently afebrile and hemodynamically stable. Her blood, urine cultures remained negative. -Her CT scan did show nephrolithiasis but it was nonobstructive.She is presently being discharged on 7 more days of oral Ceftin.  2. Diabetes type 2 without complication-patient will resume her Januvia, metformin, and her Humalog mix  3. Essential hypertension-patient will resume her carvedilol.  4. History of CHF-this is chronic systolic CHF. Clinically patient was not in congestive heart failure. Patient will resume her Aldactone, torsemide, carvedilol.  5. Hyperlipidemia - pt. Will cont. Crestor.   DISCHARGE CONDITIONS:   Stable.   CONSULTS OBTAINED:    DRUG ALLERGIES:   Allergies  Allergen Reactions  . Codeine Other (See Comments)    Reaction: jittery     DISCHARGE  MEDICATIONS:   Allergies as of 06/27/2017      Reactions   Codeine Other (See Comments)   Reaction: jittery       Medication List    TAKE these medications   aspirin EC 81 MG tablet Take 81 mg by mouth daily.   carvedilol 25 MG tablet Commonly known as:  COREG Take 1 tablet by mouth 2 (two) times daily.   cefUROXime 250 MG tablet Commonly known as:  CEFTIN Take 1 tablet (250 mg total) by mouth 2 (two) times daily with a meal.   HUMALOG MIX 75/25 KWIKPEN (75-25) 100 UNIT/ML Kwikpen Generic drug:  Insulin Lispro Prot & Lispro Inject 25 Units into the skin at bedtime.   JANUVIA 100 MG tablet Generic drug:  sitaGLIPtin Take 100 mg by mouth daily.   losartan 50 MG tablet Commonly known as:  COZAAR Take 1 tablet by mouth daily.   metFORMIN 500 MG 24 hr tablet Commonly known as:  GLUCOPHAGE-XR Take 1,000 mg by mouth 2 (two) times daily.   pantoprazole 40 MG tablet Commonly known as:  PROTONIX Take 40 mg by mouth daily.   rosuvastatin 20 MG tablet Commonly known as:  CRESTOR Take 20 mg by mouth daily.   spironolactone 25 MG tablet Commonly known as:  ALDACTONE Take 25 mg by mouth daily.   torsemide 10 MG tablet Commonly known as:  DEMADEX Take 1 tablet by mouth daily.            Discharge Care Instructions        Start     Ordered   06/27/17 0000  Activity as tolerated - No restrictions     06/27/17 1052   06/27/17 0000  Diet - low sodium heart healthy     06/27/17 1052   06/27/17 0000  cefUROXime (CEFTIN) 250 MG tablet  2 times daily with meals     06/27/17 1052   06/27/17 0000  Diet Carb Modified     06/27/17 1052        DISCHARGE INSTRUCTIONS:   DIET:  Cardiac diet and Diabetic diet  DISCHARGE CONDITION:  Stable  ACTIVITY:  Activity as tolerated  OXYGEN:  Home Oxygen: No.   Oxygen Delivery: room air  DISCHARGE LOCATION:  home   If you experience worsening of your admission symptoms, develop shortness of breath, life threatening  emergency, suicidal or homicidal thoughts you must seek medical attention immediately by calling 911 or calling your MD immediately  if symptoms less severe.  You Must read complete instructions/literature along with all the possible adverse reactions/side effects for all the Medicines you take and that have been prescribed to you. Take any new Medicines after you have completely understood and accpet all the possible adverse reactions/side effects.   Please note  You were cared for by a hospitalist during your hospital stay. If you have any questions about your discharge medications or the care you received while you were in the hospital after you are discharged, you can call the unit and asked to speak with the hospitalist on call if the hospitalist that took care of you is not available. Once you are discharged, your primary care physician will handle any further medical issues. Please note that NO REFILLS for any discharge medications will be authorized once you are discharged, as it is imperative that you return to your primary care physician (or establish a relationship with a primary care physician if you do not have one) for your aftercare needs so that they can reassess your need for medications and monitor your lab values.     Today   Denies any abdominal or back pain. No fever. Wants to go home.   VITAL SIGNS:  Blood pressure (!) 150/69, pulse 92, temperature 97.8 F (36.6 C), temperature source Oral, resp. rate 20, height  (1.575 m), weight 104.6 kg (230 lb 8 oz), SpO2 99 %.  I/O:   Intake/Output Summary (Last 24 hours) at 06/27/17 1535 Last data filed at 06/27/17 0410  Gross per 24 hour  Intake             2780 ml  Output              475 ml  Net             2305 ml    PHYSICAL EXAMINATION:  GENERAL:  66 y.o.-year-old obese patient sitting up in chair in no acute distress.  EYES: Pupils equal, round, reactive to light and accommodation. No scleral icterus. Extraocular  muscles intact.  HEENT: Head atraumatic, normocephalic. Oropharynx and nasopharynx clear.  NECK:  Supple, no jugular venous distention. No thyroid enlargement, no tenderness.  LUNGS: Normal breath sounds bilaterally, no wheezing, rales,rhonchi. No use of accessory muscles of respiration.  CARDIOVASCULAR: S1, S2 normal. No murmurs, rubs, or gallops.  ABDOMEN: Soft, non-tender, non-distended. Bowel sounds present. No organomegaly or mass.  EXTREMITIES: No pedal edema, cyanosis, or clubbing.  NEUROLOGIC: Cranial nerves II through XII are intact. No focal motor or sensory defecits b/l.  PSYCHIATRIC: The patient is alert and oriented x 3.  SKIN: No obvious rash,  lesion, or ulcer.   DATA REVIEW:   CBC  Recent Labs Lab 06/27/17 0322  WBC 9.3  HGB 9.9*  HCT 29.5*  PLT 176    Chemistries   Recent Labs Lab 06/27/17 0322  NA 138  K 3.6  CL 106  CO2 24  GLUCOSE 140*  BUN 13  CREATININE 0.63  CALCIUM 8.3*    Cardiac Enzymes No results for input(s): TROPONINI in the last 168 hours.  Microbiology Results  Results for orders placed or performed during the hospital encounter of 06/26/17  CULTURE, BLOOD (ROUTINE X 2) w Reflex to ID Panel     Status: None (Preliminary result)   Collection Time: 06/26/17  2:27 PM  Result Value Ref Range Status   Specimen Description BLOOD LEFT HAND  Final   Special Requests   Final    BOTTLES DRAWN AEROBIC AND ANAEROBIC Blood Culture results may not be optimal due to an excessive volume of blood received in culture bottles   Culture NO GROWTH < 24 HOURS  Final   Report Status PENDING  Incomplete  CULTURE, BLOOD (ROUTINE X 2) w Reflex to ID Panel     Status: None (Preliminary result)   Collection Time: 06/26/17  2:27 PM  Result Value Ref Range Status   Specimen Description BLOOD RIGHT HAND  Final   Special Requests   Final    BOTTLES DRAWN AEROBIC AND ANAEROBIC Blood Culture results may not be optimal due to an excessive volume of blood received  in culture bottles   Culture NO GROWTH < 24 HOURS  Final   Report Status PENDING  Incomplete    RADIOLOGY:  Ct Renal Stone Study  Result Date: 06/26/2017 CLINICAL DATA:  Low back pain radiating to legs. EXAM: CT ABDOMEN AND PELVIS WITHOUT CONTRAST TECHNIQUE: Multidetector CT imaging of the abdomen and pelvis was performed following the standard protocol without IV contrast. COMPARISON:  03/24/2017 FINDINGS: Lower chest: No acute abnormality. Hepatobiliary: Fatty infiltration of the liver. No focal abnormality or biliary ductal dilatation. Gallbladder unremarkable. Pancreas: No focal abnormality or ductal dilatation. Spleen: No focal abnormality.  Normal size. Adrenals/Urinary Tract: 4 mm nonobstructing stone in the lower pole of the left kidney. No ureteral stones or hydronephrosis. Adrenal glands and urinary bladder unremarkable. Stomach/Bowel: Stomach, large and small bowel grossly unremarkable. Vascular/Lymphatic: Aortic and iliac calcifications. No aneurysm or adenopathy. Reproductive: Prior hysterectomy.  No adnexal masses. Other: No free fluid or free air. Musculoskeletal: Diffuse degenerative facet disease in the lower lumbar spine. IMPRESSION: Left lower pole nephrolithiasis. No ureteral stones or hydronephrosis. Fatty infiltration of the liver. Aortoiliac atherosclerosis. No acute findings in the abdomen or pelvis. Electronically Signed   By: Charlett Nose M.D.   On: 06/26/2017 08:22      Management plans discussed with the patient, family and they are in agreement.  CODE STATUS:     Code Status Orders        Start     Ordered   06/26/17 1246  Full code  Continuous     06/26/17 1245    Code Status History    Date Active Date Inactive Code Status Order ID Comments User Context   This patient has a current code status but no historical code status.      TOTAL TIME TAKING CARE OF THIS PATIENT: 40 minutes.    Houston Siren M.D on 06/27/2017 at 3:35 PM  Between 7am to 6pm -  Pager - [phone]  After 6pm go to [email or web address] -  password Environmental education officer  [employer] [facility]  Office  [phone]  CC: Primary care physician; Lauro Regulus, MD

## 2017-06-29 LAB — URINE CULTURE

## 2017-07-01 LAB — CULTURE, BLOOD (ROUTINE X 2)
CULTURE: NO GROWTH
CULTURE: NO GROWTH

## 2017-08-16 IMAGING — CT CT RENAL STONE PROTOCOL
1 of 2 series · 15 of 32 positions shown, 19 images · non-contrast
Comparison: MRI of the lumbar spine performed 02/09/2015

CLINICAL DATA: Acute onset of right flank and back pain. Increased
urinary frequency. Initial encounter.

EXAM:
CT ABDOMEN AND PELVIS WITHOUT CONTRAST
TECHNIQUE: Multidetector CT imaging of the abdomen and pelvis was performed
following the standard protocol without IV contrast.

[Series 2: stone standard full · axial · 0.79mm/px · z∈[-432,-28]mm · 15 of 89 slices shown, 19 images]
[im 4/89  soft-tissue]
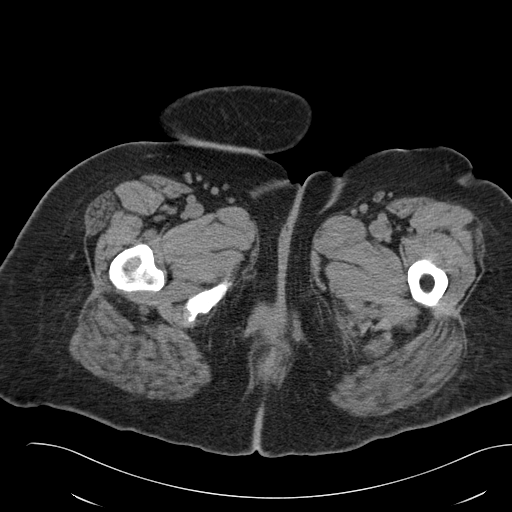
[im 4/89  bone]
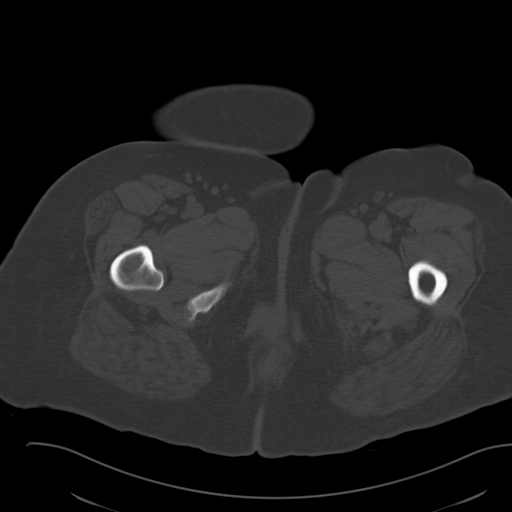
[im 11/89  soft-tissue]
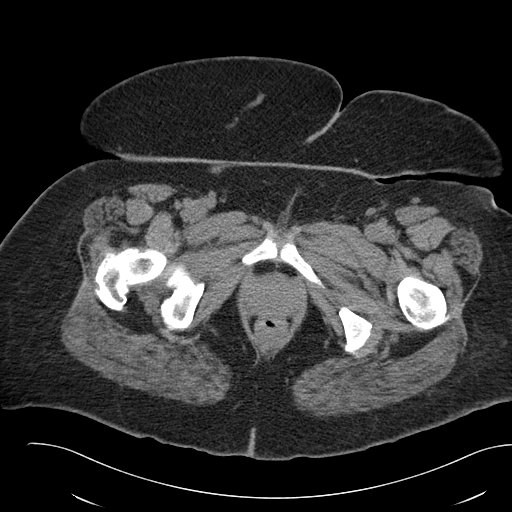
[im 18/89  soft-tissue]
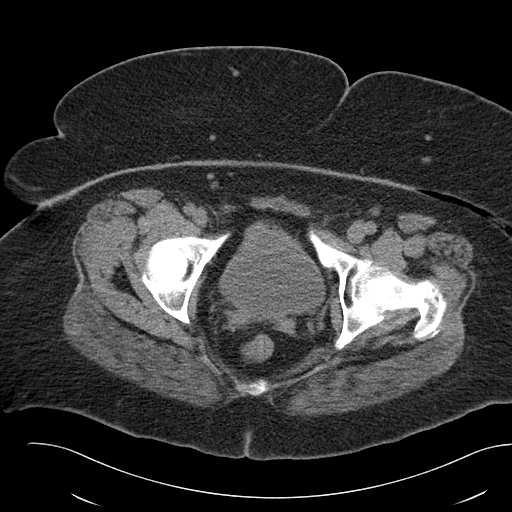
[im 25/89  soft-tissue]
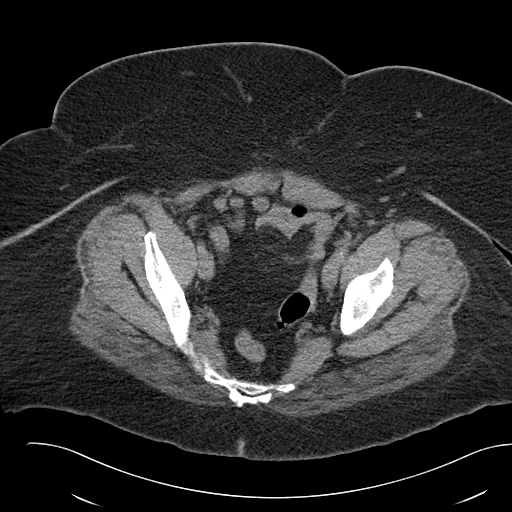
[im 32/89  soft-tissue]
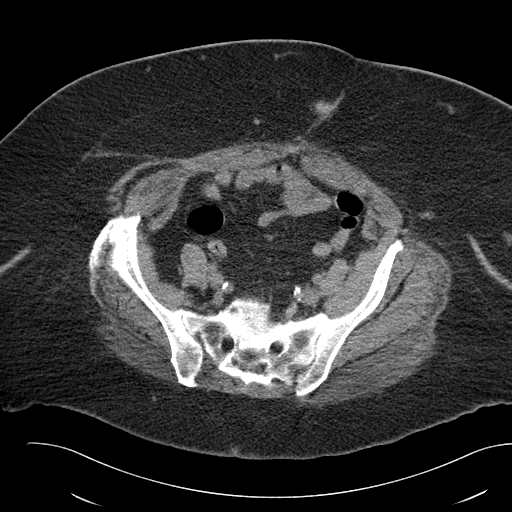
[im 39/89  soft-tissue]
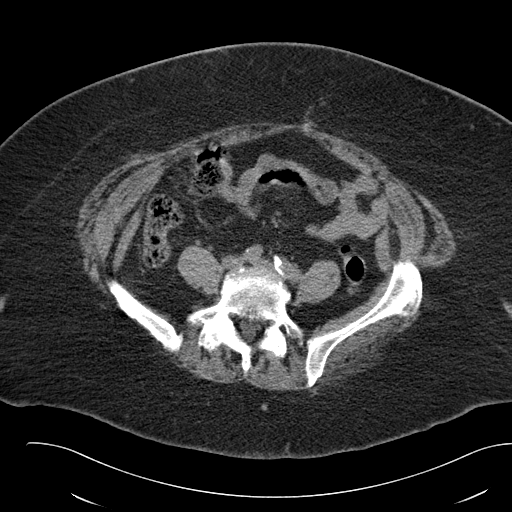
[im 46/89  soft-tissue]
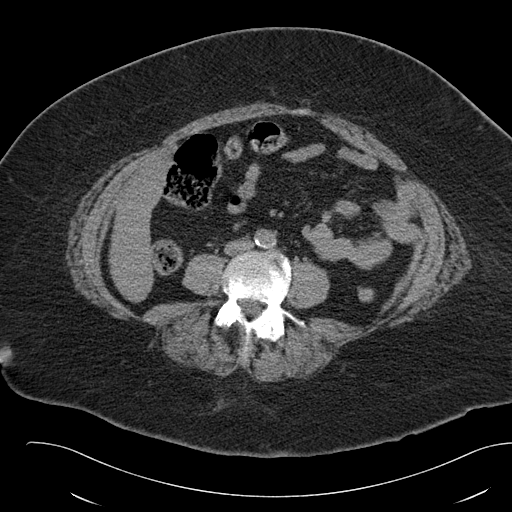
[im 50/89  soft-tissue]
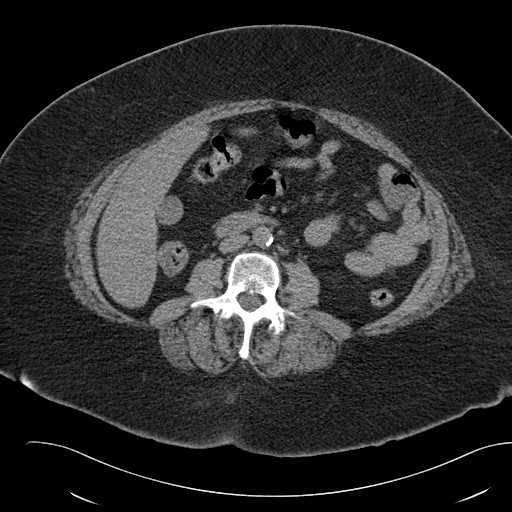
[im 57/89  soft-tissue]
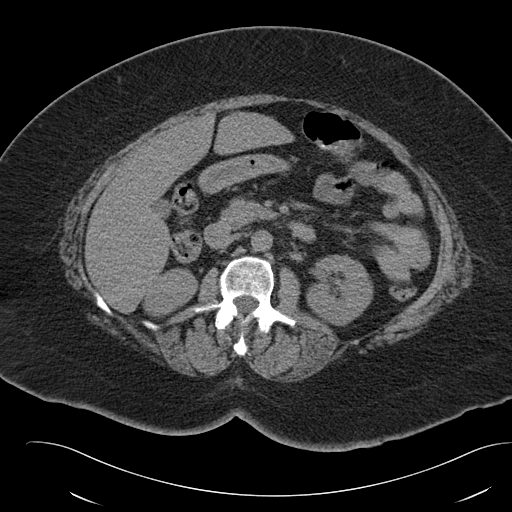
[im 57/89  bone]
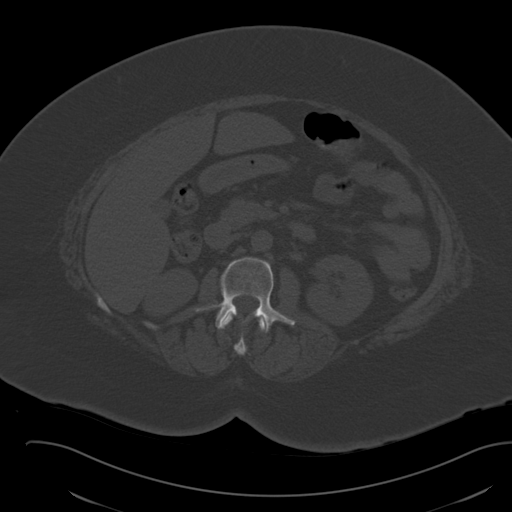
[im 64/89  soft-tissue]
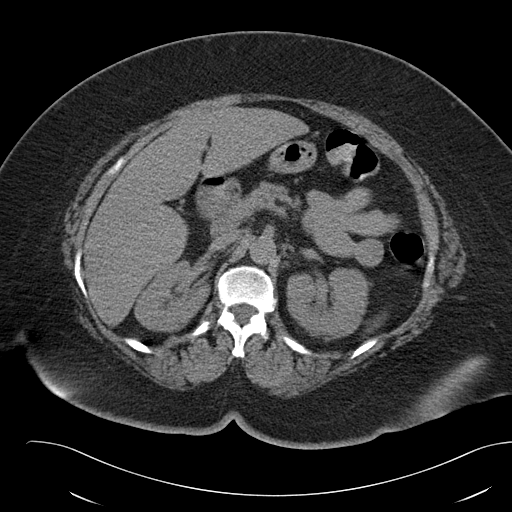
[im 71/89  soft-tissue]
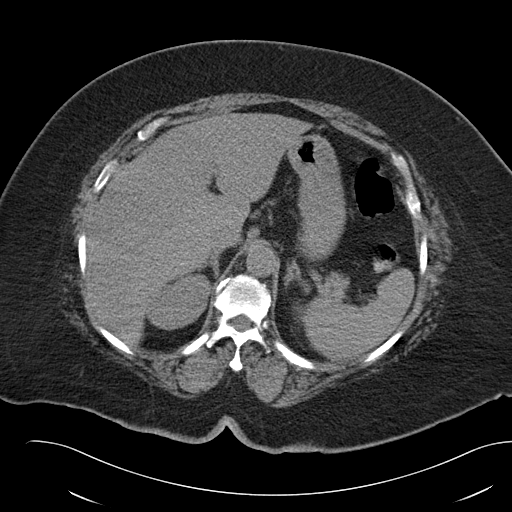
[im 74/89  lung]
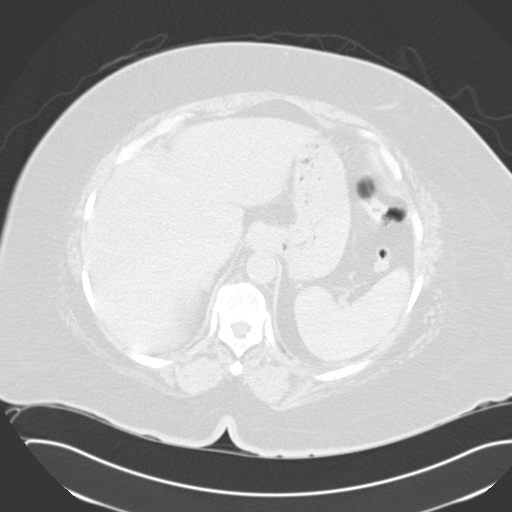
[im 78/89  soft-tissue]
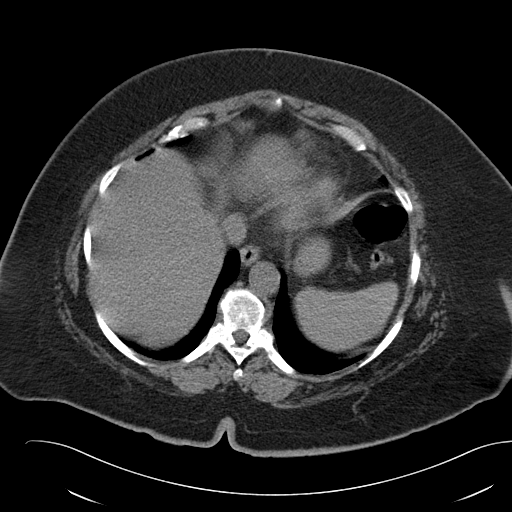
[im 78/89  lung]
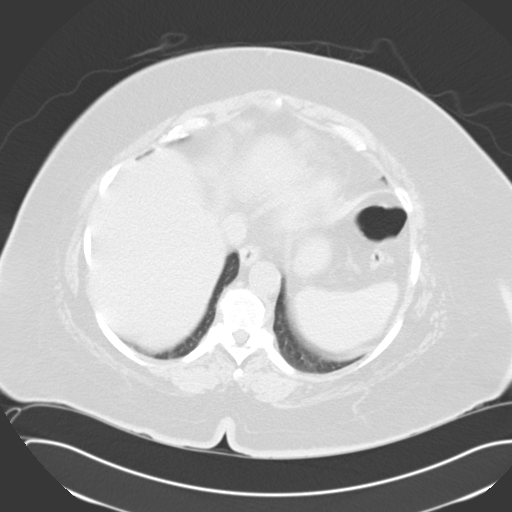
[im 81/89  lung]
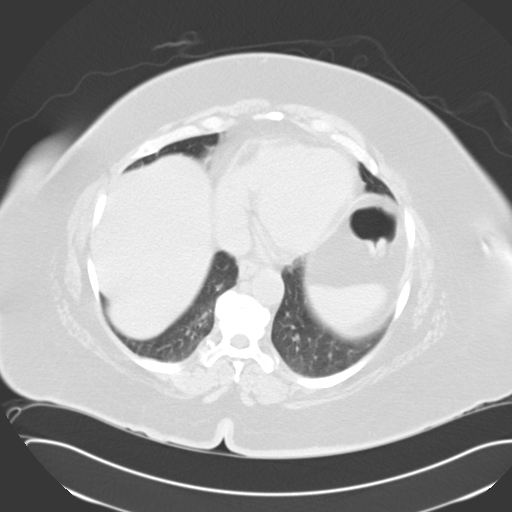
[im 85/89  soft-tissue]
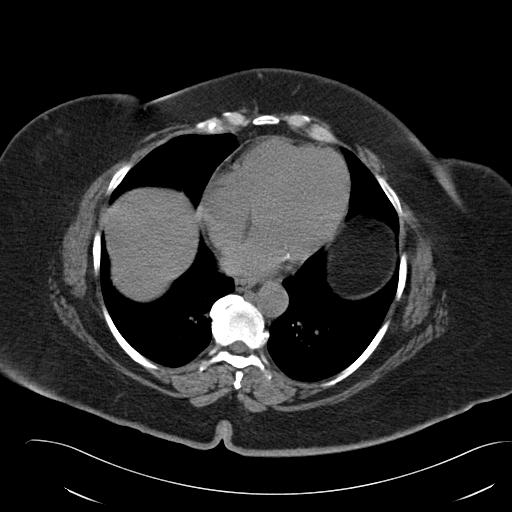
[im 85/89  lung]
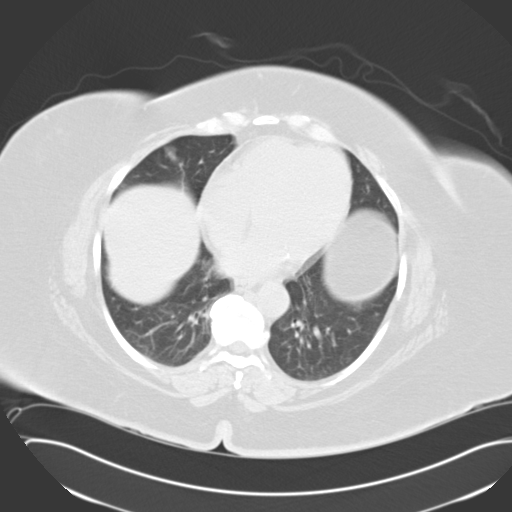

[15 of 32 positions shown; findings below may reference images not displayed]

FINDINGS: The visualized lung bases are clear.

The liver and spleen are unremarkable in appearance. The gallbladder
is within normal limits. The pancreas and adrenal glands are
unremarkable.

A 4 mm stone is noted at the lower pole of the left kidney. The
kidneys are otherwise unremarkable. There is no evidence of
hydronephrosis. No obstructing ureteral stones are identified. No
significant perinephric stranding is appreciated.

No free fluid is identified. The small bowel is unremarkable in
appearance. The stomach is within normal limits. No acute vascular
abnormalities are seen. Scattered calcification is noted along the
distal abdominal aorta and its branches.

The appendix is normal in caliber, without evidence of appendicitis.
The colon is unremarkable in appearance.

The bladder is mildly distended and grossly unremarkable. The
patient is status post hysterectomy. No suspicious adnexal masses
are seen. No inguinal lymphadenopathy is seen.

No acute osseous abnormalities are identified. Facet disease is
noted along the lower lumbar spine.
IMPRESSION: 1. No acute abnormality seen within the abdomen or pelvis.
2. 4 mm nonobstructing stone at the lower pole of the left kidney.
3. Scattered calcification along the distal abdominal aorta and its
branches.

## 2017-10-01 ENCOUNTER — Ambulatory Visit: Payer: Medicare Other | Admitting: Anesthesiology

## 2017-10-01 ENCOUNTER — Encounter
Admission: RE | Disposition: A | Discharge status: Home / Self Care | Payer: Self-pay | Source: Ambulatory Visit | Attending: Ophthalmology

## 2017-10-01 ENCOUNTER — Other Ambulatory Visit: Payer: Self-pay

## 2017-10-01 ENCOUNTER — Ambulatory Visit
Admission: RE | Admit: 2017-10-01 | Discharge: 2017-10-01 | Disposition: A | Discharge status: Home / Self Care | Payer: Medicare Other | Source: Ambulatory Visit | Attending: Ophthalmology | Admitting: Ophthalmology

## 2017-10-01 DIAGNOSIS — I509 Heart failure, unspecified: Secondary | ICD-10-CM | POA: Diagnosis not present

## 2017-10-01 DIAGNOSIS — K219 Gastro-esophageal reflux disease without esophagitis: Secondary | ICD-10-CM | POA: Insufficient documentation

## 2017-10-01 DIAGNOSIS — Z87891 Personal history of nicotine dependence: Secondary | ICD-10-CM | POA: Insufficient documentation

## 2017-10-01 DIAGNOSIS — Z794 Long term (current) use of insulin: Secondary | ICD-10-CM | POA: Insufficient documentation

## 2017-10-01 DIAGNOSIS — H2512 Age-related nuclear cataract, left eye: Secondary | ICD-10-CM | POA: Diagnosis not present

## 2017-10-01 DIAGNOSIS — M109 Gout, unspecified: Secondary | ICD-10-CM | POA: Insufficient documentation

## 2017-10-01 DIAGNOSIS — I110 Hypertensive heart disease with heart failure: Secondary | ICD-10-CM | POA: Diagnosis not present

## 2017-10-01 DIAGNOSIS — Z79899 Other long term (current) drug therapy: Secondary | ICD-10-CM | POA: Insufficient documentation

## 2017-10-01 DIAGNOSIS — E1036 Type 1 diabetes mellitus with diabetic cataract: Secondary | ICD-10-CM | POA: Insufficient documentation

## 2017-10-01 DIAGNOSIS — Z7982 Long term (current) use of aspirin: Secondary | ICD-10-CM | POA: Diagnosis not present

## 2017-10-01 HISTORY — PX: CATARACT EXTRACTION W/PHACO: SHX586

## 2017-10-01 LAB — GLUCOSE, CAPILLARY: GLUCOSE-CAPILLARY: 166 mg/dL — AB (ref 65–99)

## 2017-10-01 SURGERY — PHACOEMULSIFICATION, CATARACT, WITH IOL INSERTION
Anesthesia: Monitor Anesthesia Care | Site: Eye | Laterality: Left | Wound class: Clean

## 2017-10-01 MED ORDER — MOXIFLOXACIN HCL 0.5 % OP SOLN
OPHTHALMIC | Status: AC
  Filled 2017-10-01: qty 3

## 2017-10-01 MED ORDER — MOXIFLOXACIN HCL 0.5 % OP SOLN: 1.0000 [drp] | OPHTHALMIC | Status: DC | PRN

## 2017-10-01 MED ORDER — NA CHONDROIT SULF-NA HYALURON 40-17 MG/ML IO SOLN
INTRAOCULAR | Status: DC | PRN
  Administered 2017-10-01: 1 mL via INTRAOCULAR

## 2017-10-01 MED ORDER — CARBACHOL 0.01 % IO SOLN
INTRAOCULAR | Status: DC | PRN
  Administered 2017-10-01: 0.5 mL via INTRAOCULAR

## 2017-10-01 MED ORDER — MIDAZOLAM HCL 2 MG/2ML IJ SOLN
INTRAMUSCULAR | Status: AC
  Filled 2017-10-01: qty 2

## 2017-10-01 MED ORDER — MOXIFLOXACIN HCL 0.5 % OP SOLN
OPHTHALMIC | Status: DC | PRN
  Administered 2017-10-01: 0.2 mL via OPHTHALMIC

## 2017-10-01 MED ORDER — LIDOCAINE HCL (PF) 4 % IJ SOLN
INTRAOCULAR | Status: DC | PRN
  Administered 2017-10-01: 4 mL via OPHTHALMIC

## 2017-10-01 MED ORDER — ARMC OPHTHALMIC DILATING DROPS
1.0000 | OPHTHALMIC | Status: AC
Start: 2017-10-01 — End: 2017-10-01
  Administered 2017-10-01 (×3): 1 via OPHTHALMIC

## 2017-10-01 MED ORDER — POVIDONE-IODINE 5 % OP SOLN
OPHTHALMIC | Status: DC | PRN
  Administered 2017-10-01: 1 via OPHTHALMIC

## 2017-10-01 MED ORDER — EPINEPHRINE PF 1 MG/ML IJ SOLN
INTRAOCULAR | Status: DC | PRN
  Administered 2017-10-01: 07:00:00 via OPHTHALMIC

## 2017-10-01 MED ORDER — SODIUM CHLORIDE 0.9 % IV SOLN
INTRAVENOUS | Status: DC
  Administered 2017-10-01: 07:00:00 via INTRAVENOUS

## 2017-10-01 MED ORDER — MIDAZOLAM HCL 2 MG/2ML IJ SOLN
INTRAMUSCULAR | Status: DC | PRN
  Administered 2017-10-01: 1 mg via INTRAVENOUS

## 2017-10-01 MED ORDER — FENTANYL CITRATE (PF) 100 MCG/2ML IJ SOLN
INTRAMUSCULAR | Status: AC
  Filled 2017-10-01: qty 2

## 2017-10-01 MED ORDER — FENTANYL CITRATE (PF) 100 MCG/2ML IJ SOLN
INTRAMUSCULAR | Status: DC | PRN
Start: 2017-10-01 — End: 2017-10-01
  Administered 2017-10-01: 50 ug via INTRAVENOUS

## 2017-10-01 MED ORDER — NA CHONDROIT SULF-NA HYALURON 40-17 MG/ML IO SOLN
INTRAOCULAR | Status: AC
  Filled 2017-10-01: qty 1

## 2017-10-01 MED ORDER — PROPOFOL 10 MG/ML IV BOLUS
INTRAVENOUS | Status: AC
  Filled 2017-10-01: qty 20

## 2017-10-01 MED ORDER — ARMC OPHTHALMIC DILATING DROPS
OPHTHALMIC | Status: AC
  Administered 2017-10-01: 1 via OPHTHALMIC
  Filled 2017-10-01: qty 0.4

## 2017-10-01 SURGICAL SUPPLY — 16 items
GLOVE BIO SURGEON STRL SZ8 (GLOVE) ×3 IMPLANT
GLOVE BIOGEL M 6.5 STRL (GLOVE) ×3 IMPLANT
GLOVE SURG LX 8.0 MICRO (GLOVE) ×2
GLOVE SURG LX STRL 8.0 MICRO (GLOVE) ×1 IMPLANT
GOWN STRL REUS W/ TWL LRG LVL3 (GOWN DISPOSABLE) ×2 IMPLANT
GOWN STRL REUS W/TWL LRG LVL3 (GOWN DISPOSABLE) ×4
LABEL CATARACT MEDS ST (LABEL) ×3 IMPLANT
LENS IOL TECNIS ITEC 22.0 (Intraocular Lens) ×3 IMPLANT
PACK CATARACT (MISCELLANEOUS) ×3 IMPLANT
PACK CATARACT BRASINGTON LX (MISCELLANEOUS) ×3 IMPLANT
PACK EYE AFTER SURG (MISCELLANEOUS) ×3 IMPLANT
SOL BSS BAG (MISCELLANEOUS) ×3
SOLUTION BSS BAG (MISCELLANEOUS) ×1 IMPLANT
SYR 5ML LL (SYRINGE) ×3 IMPLANT
WATER STERILE IRR 250ML POUR (IV SOLUTION) ×3 IMPLANT
WIPE NON LINTING 3.25X3.25 (MISCELLANEOUS) ×3 IMPLANT

## 2017-10-01 NOTE — Discharge Instructions (Signed)
Eye Surgery Discharge Instructions  Expect mild scratchy sensation or mild soreness. DO NOT RUB YOUR EYE!  The day of surgery:  Minimal physical activity, but bed rest is not required  No reading, computer work, or close hand work  No bending, lifting, or straining.  May watch TV  For 24 hours:  No driving, legal decisions, or alcoholic beverages  Safety precautions  Eat anything you prefer: It is better to start with liquids, then soup then solid foods.  _____ Eye patch should be worn until postoperative exam tomorrow.  ____ Solar shield eyeglasses should be worn for comfort in the sunlight/patch while sleeping  Resume all regular medications including aspirin or Coumadin if these were discontinued prior to surgery. You may shower, bathe, shave, or wash your hair. Tylenol may be taken for mild discomfort.  Call your doctor if you experience significant pain, nausea, or vomiting, fever > 101 or other signs of infection. [phone] or [phone] Specific instructions:  Follow-up Information    Galen Manila, MD Follow up on 10/02/2017.   Specialty:  Ophthalmology Why:  10:40 AM Contact information: [address] [place] [address] [phone]

## 2017-10-01 NOTE — Anesthesia Post-op Follow-up Note (Signed)
Anesthesia QCDR form completed.        

## 2017-10-01 NOTE — Anesthesia Preprocedure Evaluation (Signed)
Anesthesia Evaluation  Patient identified by MRN, date of birth, ID band Patient awake    Reviewed: Allergy & Precautions, NPO status , Patient's Chart, lab work & pertinent test results  Airway Mallampati: III       Dental  (+) Teeth Intact   Pulmonary former smoker,     + decreased breath sounds      Cardiovascular Exercise Tolerance: Good hypertension, Pt. on medications and Pt. on home beta blockers + Cardiac Stents and +CHF   Rhythm:Regular     Neuro/Psych Depression    GI/Hepatic Neg liver ROS, GERD  Medicated,  Endo/Other  diabetes, Type 1, Insulin Dependent  Renal/GU      Musculoskeletal   Abdominal (+) + obese,   Peds negative pediatric ROS (+)  Hematology  (+) anemia ,   Anesthesia Other Findings   Reproductive/Obstetrics                             Anesthesia Physical Anesthesia Plan  ASA: III  Anesthesia Plan: MAC   Post-op Pain Management:    Induction:   PONV Risk Score and Plan: 0  Airway Management Planned: Natural Airway and Nasal Cannula  Additional Equipment:   Intra-op Plan:   Post-operative Plan:   Informed Consent: I have reviewed the patients History and Physical, chart, labs and discussed the procedure including the risks, benefits and alternatives for the proposed anesthesia with the patient or authorized representative who has indicated his/her understanding and acceptance.     Plan Discussed with: CRNA  Anesthesia Plan Comments:         Anesthesia Quick Evaluation

## 2017-10-01 NOTE — H&P (Signed)
All labs reviewed. Abnormal studies sent to patients PCP when indicated.  Previous H&P reviewed, patient examined, there are NO CHANGES.  Amanpreet Delmont LOUIS12/11/20187:16 AM

## 2017-10-01 NOTE — Transfer of Care (Signed)
Immediate Anesthesia Transfer of Care Note  Patient: Betty Luna  Procedure(s) Performed: CATARACT EXTRACTION PHACO AND INTRAOCULAR LENS PLACEMENT (IOC)-LEFT DIABETIC (Left Eye)  Patient Location: PACU  Anesthesia Type:MAC  Level of Consciousness: awake, alert  and oriented  Airway & Oxygen Therapy: Patient Spontanous Breathing  Post-op Assessment: Report given to RN and Post -op Vital signs reviewed and stable  Post vital signs: Reviewed and stable  Last Vitals:  Vitals:   10/01/17 0752 10/01/17 0755  BP: 134/67 134/67  Pulse: 75 75  Resp: 14 12  Temp: (!) 36.3 C (!) 36.3 C  SpO2: 99% 99%    Last Pain:  Vitals:   10/01/17 0752  TempSrc: Temporal         Complications: No apparent anesthesia complications

## 2017-10-01 NOTE — Op Note (Signed)
PREOPERATIVE DIAGNOSIS:  Nuclear sclerotic cataract of the left eye.   POSTOPERATIVE DIAGNOSIS:  Nuclear sclerotic cataract of the left eye.   OPERATIVE PROCEDURE: Procedure(s): CATARACT EXTRACTION PHACO AND INTRAOCULAR LENS PLACEMENT (IOC)-LEFT DIABETIC   SURGEON:  Galen Manila, MD.   ANESTHESIA:  Anesthesiologist: Darleene Cleaver, Gerrit Heck, MD CRNA: Karoline Caldwell, CRNA  1.      Managed anesthesia care. 2.     0.104ml of Shugarcaine was instilled following the paracentesis   COMPLICATIONS:  None.   TECHNIQUE:   Stop and chop   DESCRIPTION OF PROCEDURE:  The patient was examined and consented in the preoperative holding area where the aforementioned topical anesthesia was applied to the left eye and then brought back to the Operating Room where the left eye was prepped and draped in the usual sterile ophthalmic fashion and a lid speculum was placed. A paracentesis was created with the side port blade and the anterior chamber was filled with viscoelastic. A near clear corneal incision was performed with the steel keratome. A continuous curvilinear capsulorrhexis was performed with a cystotome followed by the capsulorrhexis forceps. Hydrodissection and hydrodelineation were carried out with BSS on a blunt cannula. The lens was removed in a stop and chop  technique and the remaining cortical material was removed with the irrigation-aspiration handpiece. The capsular bag was inflated with viscoelastic and the Technis [id number] lens was placed in the capsular bag without complication. The remaining viscoelastic was removed from the eye with the irrigation-aspiration handpiece. The wounds were hydrated. The anterior chamber was flushed with Miostat and the eye was inflated to physiologic pressure. 0.37ml Vigamox was placed in the anterior chamber. The wounds were found to be water tight. The eye was dressed with Vigamox. The patient was given protective glasses to wear throughout the day and a shield  with which to sleep tonight. The patient was also given drops with which to begin a drop regimen today and will follow-up with me in one day. Implant Name Type Inv. Item Serial No. Manufacturer Lot No. LRB No. Used  LENS IOL DIOP 22.0 - [id number] Intraocular Lens LENS IOL DIOP 22.0 [id number]  Left 1    Procedure(s) with comments: CATARACT EXTRACTION PHACO AND INTRAOCULAR LENS PLACEMENT (IOC)-LEFT DIABETIC (Left) - [place] 01:05.7 AP% 12.2 CDE 7.42 Fluid pack lot # [id number]  Electronically signed: Ezekiel Menzer LOUIS 10/01/2017 7:53 AM

## 2017-10-01 NOTE — Anesthesia Postprocedure Evaluation (Signed)
Anesthesia Post Note  Patient: Raeann Offner  Procedure(s) Performed: CATARACT EXTRACTION PHACO AND INTRAOCULAR LENS PLACEMENT (IOC)-LEFT DIABETIC (Left Eye)  Patient location during evaluation: PACU Anesthesia Type: MAC Level of consciousness: awake Pain management: pain level controlled Vital Signs Assessment: post-procedure vital signs reviewed and stable Respiratory status: spontaneous breathing Cardiovascular status: blood pressure returned to baseline Postop Assessment: no headache, no backache, no apparent nausea or vomiting and adequate PO intake Anesthetic complications: no     Last Vitals:  Vitals:   10/01/17 0752 10/01/17 0755  BP: 134/67 134/67  Pulse: 75 75  Resp: 14 12  Temp: (!) 36.3 C (!) 36.3 C  SpO2: 99% 99%    Last Pain:  Vitals:   10/01/17 0752  TempSrc: Temporal                 Faatimah Spielberg Lorenza Chick

## 2017-10-02 ENCOUNTER — Encounter: Payer: Self-pay | Admitting: Ophthalmology

## 2017-12-08 ENCOUNTER — Emergency Department
Admission: EM | Admit: 2017-12-08 | Discharge: 2017-12-09 | Disposition: A | Discharge status: Home / Self Care | Payer: Medicare Other | Attending: Emergency Medicine | Admitting: Emergency Medicine

## 2017-12-08 ENCOUNTER — Emergency Department: Payer: Medicare Other

## 2017-12-08 ENCOUNTER — Other Ambulatory Visit: Payer: Self-pay

## 2017-12-08 DIAGNOSIS — Z794 Long term (current) use of insulin: Secondary | ICD-10-CM | POA: Diagnosis not present

## 2017-12-08 DIAGNOSIS — M542 Cervicalgia: Secondary | ICD-10-CM | POA: Insufficient documentation

## 2017-12-08 DIAGNOSIS — Z7982 Long term (current) use of aspirin: Secondary | ICD-10-CM | POA: Insufficient documentation

## 2017-12-08 DIAGNOSIS — I110 Hypertensive heart disease with heart failure: Secondary | ICD-10-CM | POA: Diagnosis not present

## 2017-12-08 DIAGNOSIS — Z87891 Personal history of nicotine dependence: Secondary | ICD-10-CM | POA: Diagnosis not present

## 2017-12-08 DIAGNOSIS — E119 Type 2 diabetes mellitus without complications: Secondary | ICD-10-CM | POA: Insufficient documentation

## 2017-12-08 DIAGNOSIS — I509 Heart failure, unspecified: Secondary | ICD-10-CM | POA: Diagnosis not present

## 2017-12-08 DIAGNOSIS — M791 Myalgia, unspecified site: Secondary | ICD-10-CM | POA: Insufficient documentation

## 2017-12-08 DIAGNOSIS — Z79899 Other long term (current) drug therapy: Secondary | ICD-10-CM | POA: Diagnosis not present

## 2017-12-08 DIAGNOSIS — R079 Chest pain, unspecified: Secondary | ICD-10-CM | POA: Diagnosis present

## 2017-12-08 DIAGNOSIS — M7918 Myalgia, other site: Secondary | ICD-10-CM

## 2017-12-08 LAB — BASIC METABOLIC PANEL
Anion gap: 11 (ref 5–15)
BUN: 18 mg/dL (ref 6–20)
CHLORIDE: 103 mmol/L (ref 101–111)
CO2: 25 mmol/L (ref 22–32)
Calcium: 9.5 mg/dL (ref 8.9–10.3)
Creatinine, Ser: 0.77 mg/dL (ref 0.44–1.00)
GFR calc Af Amer: >60 mL/min (ref >60)
GFR calc non Af Amer: >60 mL/min (ref >60)
GLUCOSE: 161 mg/dL — AB (ref 65–99)
POTASSIUM: 4.2 mmol/L (ref 3.5–5.1)
SODIUM: 139 mmol/L (ref 135–145)

## 2017-12-08 LAB — CBC
HEMATOCRIT: 36.8 % (ref 35.0–47.0)
Hemoglobin: 11.7 g/dL — ABNORMAL LOW (ref 12.0–16.0)
MCH: 23.2 pg — ABNORMAL LOW (ref 26.0–34.0)
MCHC: 31.8 g/dL — ABNORMAL LOW (ref 32.0–36.0)
MCV: 73 fL — AB (ref 80.0–100.0)
Platelets: 259 10*3/uL (ref 150–440)
RBC: 5.05 MIL/uL (ref 3.80–5.20)
RDW: 16.9 % — AB (ref 11.5–14.5)
WBC: 9.5 10*3/uL (ref 3.6–11.0)

## 2017-12-08 LAB — TROPONIN I
Troponin I: <0.03 ng/mL (ref <0.03)
Troponin I: <0.03 ng/mL (ref <0.03)

## 2017-12-08 MED ORDER — LIDOCAINE 5 % EX PTCH
1.0000 | MEDICATED_PATCH | CUTANEOUS | Status: DC
  Administered 2017-12-09: 1 via TRANSDERMAL
  Filled 2017-12-08: qty 1

## 2017-12-08 MED ORDER — CYCLOBENZAPRINE HCL 10 MG PO TABS
10.0000 mg | ORAL_TABLET | Freq: Once | ORAL | Status: AC
  Administered 2017-12-09: 10 mg via ORAL
  Filled 2017-12-08: qty 1

## 2017-12-08 NOTE — ED Triage Notes (Signed)
Pt c/o chest pain x 1 week. Pt states radiating pain and stiffness to L neck. Pt denies n/v/d, cough and fever.

## 2017-12-08 NOTE — ED Notes (Signed)
ED Provider at bedside. 

## 2017-12-08 NOTE — ED Notes (Signed)
Pt reports intermittent chest pain for 3 days.  No sob.  No n/v/d  Pt states pain in center of chest and then moves to left upper chest.  Nonsmoker.  No cough   No fever  nsr on monitor.  Skin warm and dry.  Pt alert.

## 2017-12-09 MED ORDER — LIDOCAINE 5 % EX PTCH: 1.0000 | MEDICATED_PATCH | Freq: Two times a day (BID) | CUTANEOUS | 0 refills | Status: AC

## 2017-12-09 MED ORDER — DIAZEPAM 2 MG PO TABS: 2.0000 mg | ORAL_TABLET | Freq: Four times a day (QID) | ORAL | 0 refills | Status: DC | PRN

## 2017-12-09 MED ORDER — FAMOTIDINE 40 MG PO TABS: 40.0000 mg | ORAL_TABLET | Freq: Every evening | ORAL | 0 refills | Status: DC

## 2017-12-09 NOTE — ED Provider Notes (Signed)
[facility] Emergency Department Provider Note   ____________________________________________   First MD Initiated Contact with Patient 12/08/17 2313     (approximate)  I have reviewed the triage vital signs and the nursing notes.   HISTORY  Chief Complaint Chest Pain    HPI Betty Luna is a 67 y.o. female who comes into the hospital today with some chest pain pain in her neck and shoulders.  She reports that she has had it for the last 3-4 days.  The patient states that she has been seen by her doctors for this in the past and was told that it was gas but she does not know if she believes that.  Yesterday she had a sharp pain in her left upper quadrant that moved up into her chest.  The pain is also in her left neck.  It is a nagging pain but she does not describe any other quality.  The patient states that she has reflux but still eats foods that are known to cause reflux.  The patient states that she is also been prescribed Xanax for possible anxiety.  The patient states that the pain is gone now but her neck feels a little stiff.  The patient denies any shortness of breath, nausea, vomiting, sweats or dizziness.  She is here today for evaluation.  Past Medical History:  Diagnosis Date  . Anemia   . Arthritis   . CHF (congestive heart failure) (HCC)   . Depression   . Diabetes mellitus without complication (HCC)   . GERD (gastroesophageal reflux disease)   . Gout   . Hypertension     Patient Active Problem List   Diagnosis Date Noted  . Pyelonephritis 06/26/2017    Past Surgical History:  Procedure Laterality Date  . ABDOMINAL HYSTERECTOMY    . CATARACT EXTRACTION W/PHACO Right 04/16/2017   Procedure: CATARACT EXTRACTION PHACO AND INTRAOCULAR LENS PLACEMENT (IOC);  Surgeon: Galen Manila, MD;  Location: [facility] ORS;  Service: Ophthalmology;  Laterality: Right;  [place] 01:06 AP% 16.5 CDE 11.03 Fluid pack lot # [id number]  . CATARACT EXTRACTION  W/PHACO Left 10/01/2017   Procedure: CATARACT EXTRACTION PHACO AND INTRAOCULAR LENS PLACEMENT (IOC)-LEFT DIABETIC;  Surgeon: Galen Manila, MD;  Location: [facility] ORS;  Service: Ophthalmology;  Laterality: Left;  [place] 01:05.7 AP% 12.2 CDE 7.42 Fluid pack lot # [id number]  . CTR Left   . TONSILLECTOMY      Prior to Admission medications   Medication Sig Start Date End Date Taking? Authorizing Provider  aspirin EC 81 MG tablet Take 81 mg by mouth daily.    [provider]  carvedilol (COREG) 25 MG tablet Take 25 mg by mouth 2 (two) times daily.  02/14/15   [provider]  colchicine 0.6 MG tablet Take 0.6 mg by mouth daily.    [provider]  diazepam (VALIUM) 2 MG tablet Take 1 tablet (2 mg total) by mouth every 6 (six) hours as needed for anxiety. 12/09/17   Rebecka Apley, MD  famotidine (PEPCID) 40 MG tablet Take 1 tablet (40 mg total) by mouth every evening. 12/09/17 12/09/18  Rebecka Apley, MD  Insulin Lispro Prot & Lispro (HUMALOG MIX 75/25 KWIKPEN) (75-25) 100 UNIT/ML Kwikpen Inject 25 Units into the skin at bedtime.     [provider]  JANUVIA 100 MG tablet Take 100 mg by mouth daily. 03/04/17   [provider]  lidocaine (LIDODERM) 5 % Place 1 patch onto the skin every 12 (  twelve) hours. Remove & Discard patch within 12 hours or as directed by MD 12/09/17 12/09/18  Rebecka Apley, MD  losartan (COZAAR) 50 MG tablet Take 50 mg by mouth daily.  01/23/15   [provider]  metFORMIN (GLUCOPHAGE-XR) 500 MG 24 hr tablet Take 1,000 mg by mouth 2 (two) times daily.     [provider]  pantoprazole (PROTONIX) 40 MG tablet Take 40 mg by mouth daily. 03/07/17   [provider]  spironolactone (ALDACTONE) 25 MG tablet Take 25 mg by mouth daily.    [provider]  torsemide (DEMADEX) 10 MG tablet Take 10 mg by mouth daily.  03/23/15   [provider]    Allergies Codeine  Family History  Problem  Relation Age of Onset  . Alcohol abuse Brother     Social History Social History   Tobacco Use  . Smoking status: Former Games developer  . Smokeless tobacco: Current User    Types: Snuff  Substance Use Topics  . Alcohol use: Yes    Comment: occasonally  . Drug use: No    Review of Systems  Constitutional: No fever/chills Eyes: No visual changes. ENT: No sore throat. Cardiovascular:  chest pain. Respiratory: Denies shortness of breath. Gastrointestinal: No abdominal pain.  No nausea, no vomiting.  No diarrhea.  No constipation. Genitourinary: Negative for dysuria. Musculoskeletal: Lateral neck and shoulder pain Skin: Negative for rash. Neurological: Negative for headaches, focal weakness or numbness.   ____________________________________________   PHYSICAL EXAM:  VITAL SIGNS: ED Triage Vitals  Enc Vitals Group     BP 12/08/17 1829 (!) 169/77     Pulse Rate 12/08/17 1829 92     Resp 12/08/17 2220 13     Temp 12/08/17 1829 98.8 F (37.1 C)     Temp Source 12/08/17 1829 Oral     SpO2 12/08/17 1829 97 %     Weight 12/08/17 1834 240 lb (108.9 kg)     Height 12/08/17 1834 5\' 2"  (1.575 m)     Head Circumference --      Peak Flow --      Pain Score 12/08/17 1834 7     Pain Loc --      Pain Edu? --      Excl. in GC? --     Constitutional: Alert and oriented. Well appearing and in mild distress. Eyes: Conjunctivae are normal. PERRL. EOMI. Head: Atraumatic. Nose: No congestion/rhinnorhea. Mouth/Throat: Mucous membranes are moist.  Oropharynx non-erythematous. Neck: Tenderness to palpation of the muscle along the side of the patient's left neck and along the top of her shoulder, supple without meningeal signs. Cardiovascular: Normal rate, regular rhythm. Grossly normal heart sounds.  Good peripheral circulation. Respiratory: Normal respiratory effort.  No retractions. Lungs CTAB. Gastrointestinal: Soft and nontender. No distention.  Musculoskeletal: No lower extremity  tenderness nor edema.   Neurologic:  Normal speech and language.  Skin:  Skin is warm, dry and intact. Psychiatric: Mood and affect are normal.   ____________________________________________   LABS (all labs ordered are listed, but only abnormal results are displayed)  Labs Reviewed  BASIC METABOLIC PANEL - Abnormal; Notable for the following components:      Result Value   Glucose, Bld 161 (*)    All other components within normal limits  CBC - Abnormal; Notable for the following components:   Hemoglobin 11.7 (*)    MCV 73.0 (*)    MCH 23.2 (*)    MCHC 31.8 (*)  RDW 16.9 (*)    All other components within normal limits  TROPONIN I  TROPONIN I   ____________________________________________  EKG  ED ECG REPORT I, Rebecka Apley, the attending physician, personally viewed and interpreted this ECG.   Date: 12/08/2017  EKG Time: 1833  Rate: 93  Rhythm: normal sinus rhythm  Axis: normal  Intervals:none  ST&T Change: none  ____________________________________________  RADIOLOGY  ED MD interpretation:  CXR: No acute disease  Official radiology report(s): Dg Chest 2 View  Result Date: 12/08/2017 CLINICAL DATA:  Chest pain. EXAM: CHEST  2 VIEW COMPARISON:  June 13, 2017 FINDINGS: The heart, hila, and mediastinum are normal. No pneumothorax. No nodules or masses. Mildly coarsened lung markings, particularly in the left base are stable. No new infiltrate. IMPRESSION: Coarsened lung markings in the bases, left greater than right, likely bronchitic. No suspicious infiltrate. Electronically Signed   By: Gerome Sam III M.D   On: 12/08/2017 19:18    ____________________________________________   PROCEDURES  Procedure(s) performed: None  Procedures  Critical Care performed: No  ____________________________________________   INITIAL IMPRESSION / ASSESSMENT AND PLAN / ED COURSE  As part of my medical decision making, I reviewed the following data within  the electronic MEDICAL RECORD NUMBER Notes from prior ED visits and [place] Controlled Substance Database   This is a 67 year old female who comes into the hospital today with some chest pain as well as stiffness in her left neck and shoulders.  My differential diagnosis includes acute coronary syndrome, reflux, musculoskeletal pain.  The patient had some blood work drawn to include a CBC BMP and troponin.  The patient also had a chest x-ray which was negative.  The patient's blood work was negative.  I went in and had a conversation with the patient but she states that the pain is gone at this time.  I repeated the troponin and give the patient a dose of Flexeril as well as a Lidoderm patch to her neck.  She reports that her neck felt better and her repeat troponin was negative.  I discussed with the patient a reflux diet and how to stay away from things that may trigger her reflux which could be contributing to her pain.  The patient understands and agrees.  She will be discharged home to follow-up with her primary care physician.      ____________________________________________   FINAL CLINICAL IMPRESSION(S) / ED DIAGNOSES  Final diagnoses:  Chest pain, unspecified type  Neck pain  Musculoskeletal pain     ED Discharge Orders        Ordered    famotidine (PEPCID) 40 MG tablet  Every evening     12/09/17 0110    lidocaine (LIDODERM) 5 %  Every 12 hours     12/09/17 0110    diazepam (VALIUM) 2 MG tablet  Every 6 hours PRN     12/09/17 0110       Note:  This document was prepared using Dragon voice recognition software and may include unintentional dictation errors.    Rebecka Apley, MD 12/09/17 [phone]

## 2017-12-09 NOTE — ED Notes (Signed)
Pt signed esignature.  D/c  inst to pt.  

## 2017-12-09 NOTE — Discharge Instructions (Signed)
Please follow up with your primary care physician for further evaluation °

## 2017-12-28 ENCOUNTER — Emergency Department: Payer: Medicare Other

## 2017-12-28 ENCOUNTER — Other Ambulatory Visit: Payer: Self-pay

## 2017-12-28 ENCOUNTER — Encounter: Payer: Self-pay | Admitting: Emergency Medicine

## 2017-12-28 ENCOUNTER — Emergency Department
Admission: EM | Admit: 2017-12-28 | Discharge: 2017-12-28 | Disposition: A | Discharge status: Home / Self Care | Payer: Medicare Other | Attending: Emergency Medicine | Admitting: Emergency Medicine

## 2017-12-28 DIAGNOSIS — I110 Hypertensive heart disease with heart failure: Secondary | ICD-10-CM | POA: Insufficient documentation

## 2017-12-28 DIAGNOSIS — E119 Type 2 diabetes mellitus without complications: Secondary | ICD-10-CM | POA: Diagnosis not present

## 2017-12-28 DIAGNOSIS — R1030 Lower abdominal pain, unspecified: Secondary | ICD-10-CM | POA: Diagnosis not present

## 2017-12-28 DIAGNOSIS — R300 Dysuria: Secondary | ICD-10-CM | POA: Diagnosis present

## 2017-12-28 DIAGNOSIS — Z7982 Long term (current) use of aspirin: Secondary | ICD-10-CM | POA: Diagnosis not present

## 2017-12-28 DIAGNOSIS — I509 Heart failure, unspecified: Secondary | ICD-10-CM | POA: Insufficient documentation

## 2017-12-28 DIAGNOSIS — Z79899 Other long term (current) drug therapy: Secondary | ICD-10-CM | POA: Insufficient documentation

## 2017-12-28 DIAGNOSIS — N12 Tubulo-interstitial nephritis, not specified as acute or chronic: Secondary | ICD-10-CM | POA: Insufficient documentation

## 2017-12-28 DIAGNOSIS — Z87891 Personal history of nicotine dependence: Secondary | ICD-10-CM | POA: Insufficient documentation

## 2017-12-28 DIAGNOSIS — Z7984 Long term (current) use of oral hypoglycemic drugs: Secondary | ICD-10-CM | POA: Insufficient documentation

## 2017-12-28 DIAGNOSIS — R059 Cough, unspecified: Secondary | ICD-10-CM

## 2017-12-28 DIAGNOSIS — R05 Cough: Secondary | ICD-10-CM

## 2017-12-28 LAB — INFLUENZA PANEL BY PCR (TYPE A & B)
INFLBPCR: NEGATIVE
Influenza A By PCR: NEGATIVE

## 2017-12-28 LAB — URINALYSIS, ROUTINE W REFLEX MICROSCOPIC
Bacteria, UA: NONE SEEN
Bilirubin Urine: NEGATIVE
GLUCOSE, UA: NEGATIVE mg/dL
HGB URINE DIPSTICK: NEGATIVE
Ketones, ur: NEGATIVE mg/dL
NITRITE: NEGATIVE
Protein, ur: 30 mg/dL — AB
SPECIFIC GRAVITY, URINE: 1.013 (ref 1.005–1.030)
pH: 7 (ref 5.0–8.0)

## 2017-12-28 LAB — CBC WITH DIFFERENTIAL/PLATELET
BASOS ABS: 0 10*3/uL (ref 0–0.1)
Basophils Relative: 0 %
EOS ABS: 0 10*3/uL (ref 0–0.7)
Eosinophils Relative: 0 %
HCT: 33.5 % — ABNORMAL LOW (ref 35.0–47.0)
HEMOGLOBIN: 10.7 g/dL — AB (ref 12.0–16.0)
LYMPHS ABS: 0.7 10*3/uL — AB (ref 1.0–3.6)
LYMPHS PCT: 5 %
MCH: 23.5 pg — AB (ref 26.0–34.0)
MCHC: 31.9 g/dL — ABNORMAL LOW (ref 32.0–36.0)
MCV: 73.5 fL — AB (ref 80.0–100.0)
Monocytes Absolute: 1.6 10*3/uL — ABNORMAL HIGH (ref 0.2–0.9)
Monocytes Relative: 13 %
NEUTROS PCT: 82 %
Neutro Abs: 10.8 10*3/uL — ABNORMAL HIGH (ref 1.4–6.5)
Platelets: 236 10*3/uL (ref 150–440)
RBC: 4.56 MIL/uL (ref 3.80–5.20)
RDW: 16.1 % — ABNORMAL HIGH (ref 11.5–14.5)
WBC: 13.2 10*3/uL — AB (ref 3.6–11.0)

## 2017-12-28 LAB — BASIC METABOLIC PANEL
ANION GAP: 12 (ref 5–15)
BUN: 15 mg/dL (ref 6–20)
CHLORIDE: 98 mmol/L — AB (ref 101–111)
CO2: 25 mmol/L (ref 22–32)
Calcium: 8.9 mg/dL (ref 8.9–10.3)
Creatinine, Ser: 0.85 mg/dL (ref 0.44–1.00)
GFR calc Af Amer: >60 mL/min (ref >60)
Glucose, Bld: 178 mg/dL — ABNORMAL HIGH (ref 65–99)
POTASSIUM: 3.7 mmol/L (ref 3.5–5.1)
SODIUM: 135 mmol/L (ref 135–145)

## 2017-12-28 LAB — LACTIC ACID, PLASMA: LACTIC ACID, VENOUS: 1.3 mmol/L (ref 0.5–1.9)

## 2017-12-28 MED ORDER — SODIUM CHLORIDE 0.9 % IV SOLN
1.0000 g | INTRAVENOUS | Status: DC
  Administered 2017-12-28: 1 g via INTRAVENOUS
  Filled 2017-12-28: qty 10

## 2017-12-28 MED ORDER — PHENAZOPYRIDINE HCL 200 MG PO TABS: 200.0000 mg | ORAL_TABLET | Freq: Three times a day (TID) | ORAL | 0 refills | Status: DC | PRN

## 2017-12-28 MED ORDER — CEFUROXIME AXETIL 250 MG PO TABS: 250.0000 mg | ORAL_TABLET | Freq: Two times a day (BID) | ORAL | 0 refills | Status: DC

## 2017-12-28 MED ORDER — SODIUM CHLORIDE 0.9 % IV BOLUS (SEPSIS)
1000.0000 mL | Freq: Once | INTRAVENOUS | Status: AC
  Administered 2017-12-28: 1000 mL via INTRAVENOUS

## 2017-12-28 NOTE — ED Provider Notes (Signed)
[facility] Emergency Department Provider Note   ____________________________________________   First MD Initiated Contact with Patient 12/28/17 0207     (approximate)  I have reviewed the triage vital signs and the nursing notes.   HISTORY  Chief Complaint Cough and Dysuria    HPI Betty Luna is a 67 y.o. female who presents to the ED from home with a chief complaint of cough, fever, chills, dysuria, urinary frequency and left flank pain.  History of sepsis secondary to pyelonephritis last September and found to have nonobstructive nephrolithiasis.  Reports a 1 day history of nonproductive cough, dysuria, urinary frequency.  Notes suprapubic and left flank pain.  Did not take her temperature but complains of chills.  States she feels similarly as she did last fall when she was admitted.  Denies chest pain, shortness of breath, nausea, vomiting, diarrhea.  Denies recent travel or trauma.   Past Medical History:  Diagnosis Date  . Anemia   . Arthritis   . CHF (congestive heart failure) (HCC)   . Depression   . Diabetes mellitus without complication (HCC)   . GERD (gastroesophageal reflux disease)   . Gout   . Hypertension     Patient Active Problem List   Diagnosis Date Noted  . Pyelonephritis 06/26/2017    Past Surgical History:  Procedure Laterality Date  . ABDOMINAL HYSTERECTOMY    . CATARACT EXTRACTION W/PHACO Right 04/16/2017   Procedure: CATARACT EXTRACTION PHACO AND INTRAOCULAR LENS PLACEMENT (IOC);  Surgeon: Galen Manila, MD;  Location: [facility] ORS;  Service: Ophthalmology;  Laterality: Right;  [place] 01:06 AP% 16.5 CDE 11.03 Fluid pack lot # [id number]  . CATARACT EXTRACTION W/PHACO Left 10/01/2017   Procedure: CATARACT EXTRACTION PHACO AND INTRAOCULAR LENS PLACEMENT (IOC)-LEFT DIABETIC;  Surgeon: Galen Manila, MD;  Location: [facility] ORS;  Service: Ophthalmology;  Laterality: Left;  [place] 01:05.7 AP% 12.2 CDE 7.42 Fluid pack lot #  [id number]  . CTR Left   . TONSILLECTOMY      Prior to Admission medications   Medication Sig Start Date End Date Taking? Authorizing Provider  aspirin EC 81 MG tablet Take 81 mg by mouth daily.    [provider]  carvedilol (COREG) 25 MG tablet Take 25 mg by mouth 2 (two) times daily.  02/14/15   [provider]  cefUROXime (CEFTIN) 250 MG tablet Take 1 tablet (250 mg total) by mouth 2 (two) times daily with a meal. 12/28/17   Irean Hong, MD  colchicine 0.6 MG tablet Take 0.6 mg by mouth daily.    [provider]  diazepam (VALIUM) 2 MG tablet Take 1 tablet (2 mg total) by mouth every 6 (six) hours as needed for anxiety. 12/09/17   Rebecka Apley, MD  famotidine (PEPCID) 40 MG tablet Take 1 tablet (40 mg total) by mouth every evening. 12/09/17 12/09/18  Rebecka Apley, MD  Insulin Lispro Prot & Lispro (HUMALOG MIX 75/25 KWIKPEN) (75-25) 100 UNIT/ML Kwikpen Inject 25 Units into the skin at bedtime.     [provider]  JANUVIA 100 MG tablet Take 100 mg by mouth daily. 03/04/17   [provider]  lidocaine (LIDODERM) 5 % Place 1 patch onto the skin every 12 (twelve) hours. Remove & Discard patch within 12 hours or as directed by MD 12/09/17 12/09/18  Rebecka Apley, MD  losartan (COZAAR) 50 MG tablet Take 50 mg by mouth daily.  01/23/15   [provider]  metFORMIN (GLUCOPHAGE-XR) 500 MG 24  hr tablet Take 1,000 mg by mouth 2 (two) times daily.     [provider]  pantoprazole (PROTONIX) 40 MG tablet Take 40 mg by mouth daily. 03/07/17   [provider]  phenazopyridine (PYRIDIUM) 200 MG tablet Take 1 tablet (200 mg total) by mouth 3 (three) times daily as needed for pain. 12/28/17   Irean Hong, MD  spironolactone (ALDACTONE) 25 MG tablet Take 25 mg by mouth daily.    [provider]  torsemide (DEMADEX) 10 MG tablet Take 10 mg by mouth daily.  03/23/15   [provider]    Allergies Codeine  Family  History  Problem Relation Age of Onset  . Alcohol abuse Brother     Social History Social History   Tobacco Use  . Smoking status: Former Games developer  . Smokeless tobacco: Current User    Types: Snuff  Substance Use Topics  . Alcohol use: Yes    Comment: occasonally  . Drug use: No    Review of Systems  Constitutional: Positive for subjective fever/chills.  Positive for body aches. Eyes: No visual changes. ENT: No sore throat. Cardiovascular: Denies chest pain. Respiratory: Positive for dry cough.  Denies shortness of breath. Gastrointestinal: Positive for suprapubic abdominal pain and left flank pain.  No nausea, no vomiting.  No diarrhea.  No constipation. Genitourinary: Positive for dysuria. Musculoskeletal: Negative for back pain. Skin: Negative for rash. Neurological: Negative for headaches, focal weakness or numbness.   ____________________________________________   PHYSICAL EXAM:  VITAL SIGNS: ED Triage Vitals  Enc Vitals Group     BP 12/28/17 0142 (!) 125/54     Pulse Rate 12/28/17 0142 (!) 104     Resp 12/28/17 0142 18     Temp 12/28/17 0142 100.3 F (37.9 C)     Temp Source 12/28/17 0142 Oral     SpO2 12/28/17 0142 95 %     Weight 12/28/17 0144 233 lb (105.7 kg)     Height 12/28/17 0144 5\' 2"  (1.575 m)     Head Circumference --      Peak Flow --      Pain Score 12/28/17 0144 9     Pain Loc --      Pain Edu? --      Excl. in GC? --     Constitutional: Alert and oriented. Well appearing and in no acute distress. Eyes: Conjunctivae are normal. PERRL. EOMI. Head: Atraumatic. Nose: No congestion/rhinnorhea. Mouth/Throat: Mucous membranes are moist.  Oropharynx non-erythematous. Neck: No stridor.  Supple neck without meningismus. Cardiovascular: Normal rate, regular rhythm. Grossly normal heart sounds.  Good peripheral circulation. Respiratory: Normal respiratory effort.  No retractions. Lungs CTAB. Gastrointestinal: Soft and nontender to light or deep  palpation. No distention. No abdominal bruits. No CVA tenderness. Musculoskeletal: No lower extremity tenderness nor edema.  No joint effusions. Neurologic:  Normal speech and language. No gross focal neurologic deficits are appreciated. No gait instability. Skin:  Skin is warm, dry and intact. No rash noted.  No petechiae. Psychiatric: Mood and affect are normal. Speech and behavior are normal.  ____________________________________________   LABS (all labs ordered are listed, but only abnormal results are displayed)  Labs Reviewed  URINALYSIS, ROUTINE W REFLEX MICROSCOPIC - Abnormal; Notable for the following components:      Result Value   Color, Urine YELLOW (*)    APPearance HAZY (*)    Protein, ur 30 (*)    Leukocytes, UA TRACE (*)    Squamous Epithelial /  LPF 0-5 (*)    All other components within normal limits  CBC WITH DIFFERENTIAL/PLATELET - Abnormal; Notable for the following components:   WBC 13.2 (*)    Hemoglobin 10.7 (*)    HCT 33.5 (*)    MCV 73.5 (*)    MCH 23.5 (*)    MCHC 31.9 (*)    RDW 16.1 (*)    Neutro Abs 10.8 (*)    Lymphs Abs 0.7 (*)    Monocytes Absolute 1.6 (*)    All other components within normal limits  BASIC METABOLIC PANEL - Abnormal; Notable for the following components:   Chloride 98 (*)    Glucose, Bld 178 (*)    All other components within normal limits  CULTURE, BLOOD (ROUTINE X 2)  CULTURE, BLOOD (ROUTINE X 2)  URINE CULTURE  LACTIC ACID, PLASMA  INFLUENZA PANEL BY PCR (TYPE A & B)   ____________________________________________  EKG  None ____________________________________________  RADIOLOGY  ED MD interpretation: No pneumonia, stable left renal stone, question ascending pyelonephritis  Official radiology report(s): Dg Chest 2 View  Result Date: 12/28/2017 CLINICAL DATA:  Acute onset of cough. EXAM: CHEST - 2 VIEW COMPARISON:  Chest radiograph performed 12/08/2017 FINDINGS: The lungs are well-aerated. Pulmonary vascularity  is at the upper limits of normal. There is no evidence of focal opacification, pleural effusion or pneumothorax. The heart is normal in size; the mediastinal contour is within normal limits. No acute osseous abnormalities are seen. IMPRESSION: No acute cardiopulmonary process seen. Electronically Signed   By: Roanna Raider M.D.   On: 12/28/2017 03:21   Ct Renal Stone Study  Result Date: 12/28/2017 CLINICAL DATA:  Cough dysuria urinary frequency EXAM: CT ABDOMEN AND PELVIS WITHOUT CONTRAST TECHNIQUE: Multidetector CT imaging of the abdomen and pelvis was performed following the standard protocol without IV contrast. COMPARISON:  06/26/2017, 03/24/2017 FINDINGS: Lower chest: Lung bases demonstrate no acute consolidation or pleural effusion. Normal heart size. Hepatobiliary: No focal liver abnormality is seen. No gallstones, gallbladder wall thickening, or biliary dilatation. Pancreas: Unremarkable. No pancreatic ductal dilatation or surrounding inflammatory changes. Spleen: Normal in size without focal abnormality. Adrenals/Urinary Tract: Adrenal glands are within normal limits. No significant hydronephrosis. 4 mm stone in the lower pole of the left kidney. No definite ureteral stone. Mild left perinephric edema with minimal hazy edema at the left renal pelvis and around the ureter. Bladder is normal Stomach/Bowel: Stomach is within normal limits. Appendix appears normal. No evidence of bowel wall thickening, distention, or inflammatory changes. Vascular/Lymphatic: Moderate aortic atherosclerosis. No aneurysmal dilatation. No significantly enlarged lymph nodes. Reproductive: Status post hysterectomy. No adnexal masses. Other: Negative for free air or free fluid. Musculoskeletal: Degenerative changes. No acute or suspicious bone lesion IMPRESSION: 1. No definite hydronephrosis or ureteral stone is seen. There is a stable 4 mm stone in the lower pole left kidney. 2. Mild left perinephric fat stranding and edema at  the left renal pelvis and around the proximal ureter, query ascending infection/pyelonephritis. Electronically Signed   By: Jasmine Pang M.D.   On: 12/28/2017 03:18    ____________________________________________   PROCEDURES  Procedure(s) performed: None  Procedures  Critical Care performed: No  ____________________________________________   INITIAL IMPRESSION / ASSESSMENT AND PLAN / ED COURSE  As part of my medical decision making, I reviewed the following data within the electronic MEDICAL RECORD NUMBER Nursing notes reviewed and incorporated, Labs reviewed, Old chart reviewed, Radiograph reviewed  and Notes from prior ED visits   67 year old female who presents  with a 1 day history of cough and dysuria.  Found to have low-grade fever with emesis demonstrating trace leukocytes, TNTC WBC and 6-30 RBC. Given patient's co-morbidities and prior history of sepsis secondary to pyelonephritis, will obtain further workup with labwork and imaging.  Clinical Course as of Dec 29 607  Sat Dec 28, 2017  7829 Patient resting in no acute distress.  Updated patient and family member of all test results.  Lactate is normal, nonobstructive stones seen on CT, most likely developing mild pyelonephritis.  Review of patient's chart from prior hospitalization shows that her urine culture did not grow specific bacteria and she did well on oral Ceftin.  Given that flora quinolones now have a black box warning, and I would hesitate to give this older lady Bactrim, will discharge home on Ceftin.  Blood and urine cultures are pending.  Patient was given strict return precautions.  Both patient and her spouse verbalize understanding and agree with plan of care.  [JS]    Clinical Course User Index [JS] Irean Hong, MD     ____________________________________________   FINAL CLINICAL IMPRESSION(S) / ED DIAGNOSES  Final diagnoses:  Cough  Dysuria  Pyelonephritis     ED Discharge Orders        Ordered     cefUROXime (CEFTIN) 250 MG tablet  2 times daily with meals     12/28/17 0432    phenazopyridine (PYRIDIUM) 200 MG tablet  3 times daily PRN     12/28/17 0432       Note:  This document was prepared using Dragon voice recognition software and may include unintentional dictation errors.    Irean Hong, MD 12/28/17 [phone]

## 2017-12-28 NOTE — ED Triage Notes (Addendum)
Pt reports cough since yesterday; intermittent, with clear sputum; pt also reports dysuria and urinary frequency that started last evening; pt temp 100.3 in triage; pt says she gets UTI's frequently; c/o nausea but no vomiting; reports no appetite

## 2017-12-28 NOTE — ED Notes (Signed)

## 2017-12-28 NOTE — ED Notes (Signed)
Patient transported to CT 

## 2017-12-28 NOTE — Discharge Instructions (Signed)
1.  Take antibiotic as prescribed (Ceftin 250 mg twice daily for 10 days). 2.  You may take Pyridium as needed for urinary discomfort. 3.  Return to the ER for worsening symptoms, high fever, persistent vomiting or other concerns.

## 2017-12-30 LAB — URINE CULTURE: Culture: >=100000 — AB

## 2018-01-02 LAB — CULTURE, BLOOD (ROUTINE X 2)
Culture: NO GROWTH
Culture: NO GROWTH
SPECIAL REQUESTS: ADEQUATE
Special Requests: ADEQUATE

## 2018-01-26 ENCOUNTER — Emergency Department
Admission: EM | Admit: 2018-01-26 | Discharge: 2018-01-26 | Disposition: A | Discharge status: Home / Self Care | Payer: Medicare Other | Attending: Emergency Medicine | Admitting: Emergency Medicine

## 2018-01-26 ENCOUNTER — Encounter: Payer: Self-pay | Admitting: Emergency Medicine

## 2018-01-26 ENCOUNTER — Other Ambulatory Visit: Payer: Self-pay

## 2018-01-26 DIAGNOSIS — Z79899 Other long term (current) drug therapy: Secondary | ICD-10-CM | POA: Diagnosis not present

## 2018-01-26 DIAGNOSIS — I509 Heart failure, unspecified: Secondary | ICD-10-CM | POA: Insufficient documentation

## 2018-01-26 DIAGNOSIS — M79652 Pain in left thigh: Secondary | ICD-10-CM | POA: Diagnosis present

## 2018-01-26 DIAGNOSIS — Z7982 Long term (current) use of aspirin: Secondary | ICD-10-CM | POA: Diagnosis not present

## 2018-01-26 DIAGNOSIS — E119 Type 2 diabetes mellitus without complications: Secondary | ICD-10-CM | POA: Diagnosis not present

## 2018-01-26 DIAGNOSIS — M5432 Sciatica, left side: Secondary | ICD-10-CM | POA: Diagnosis not present

## 2018-01-26 DIAGNOSIS — I110 Hypertensive heart disease with heart failure: Secondary | ICD-10-CM | POA: Diagnosis not present

## 2018-01-26 DIAGNOSIS — Z87891 Personal history of nicotine dependence: Secondary | ICD-10-CM | POA: Diagnosis not present

## 2018-01-26 DIAGNOSIS — Z794 Long term (current) use of insulin: Secondary | ICD-10-CM | POA: Diagnosis not present

## 2018-01-26 MED ORDER — PREDNISONE 10 MG (21) PO TBPK: ORAL_TABLET | ORAL | 0 refills | Status: DC

## 2018-01-26 NOTE — ED Triage Notes (Signed)
x2 weeks of left buttock pain radiating down left leg , no injury recalled , sensation and movement WNL

## 2018-01-26 NOTE — ED Provider Notes (Signed)
[facility] Emergency Department Provider Note  ____________________________________________   I have reviewed the triage vital signs and the nursing notes.   HISTORY  Chief Complaint Back Pain   History limited by: Not Limited   HPI Betty Luna is a 67 y.o. female who presents to the emergency department today because of concerns for buttock pain.  Is located in her left buttock.  It does radiate that the back side of her left thigh.  Patient states the pain is been present for the past 2 weeks.  She denies any trauma to her leg.  Denies any unusual activity.  States she went to her primary care doctor when it first started who told her it was going to be okay.  She states the pain is progressively gotten worse.  She denies any fevers.   Per medical record review patient has a history of DM  Past Medical History:  Diagnosis Date  . Anemia   . Arthritis   . CHF (congestive heart failure) (HCC)   . Depression   . Diabetes mellitus without complication (HCC)   . GERD (gastroesophageal reflux disease)   . Gout   . Hypertension     Patient Active Problem List   Diagnosis Date Noted  . Pyelonephritis 06/26/2017    Past Surgical History:  Procedure Laterality Date  . ABDOMINAL HYSTERECTOMY    . CATARACT EXTRACTION W/PHACO Right 04/16/2017   Procedure: CATARACT EXTRACTION PHACO AND INTRAOCULAR LENS PLACEMENT (IOC);  Surgeon: Galen Manila, MD;  Location: [facility] ORS;  Service: Ophthalmology;  Laterality: Right;  [place] 01:06 AP% 16.5 CDE 11.03 Fluid pack lot # [id number]  . CATARACT EXTRACTION W/PHACO Left 10/01/2017   Procedure: CATARACT EXTRACTION PHACO AND INTRAOCULAR LENS PLACEMENT (IOC)-LEFT DIABETIC;  Surgeon: Galen Manila, MD;  Location: [facility] ORS;  Service: Ophthalmology;  Laterality: Left;  [place] 01:05.7 AP% 12.2 CDE 7.42 Fluid pack lot # [id number]  . CTR Left   . TONSILLECTOMY      Prior to Admission medications   Medication Sig Start  Date End Date Taking? Authorizing Provider  aspirin EC 81 MG tablet Take 81 mg by mouth daily.    [provider]  carvedilol (COREG) 25 MG tablet Take 25 mg by mouth 2 (two) times daily.  02/14/15   [provider]  cefUROXime (CEFTIN) 250 MG tablet Take 1 tablet (250 mg total) by mouth 2 (two) times daily with a meal. 12/28/17   Irean Hong, MD  colchicine 0.6 MG tablet Take 0.6 mg by mouth daily.    [provider]  diazepam (VALIUM) 2 MG tablet Take 1 tablet (2 mg total) by mouth every 6 (six) hours as needed for anxiety. 12/09/17   Rebecka Apley, MD  famotidine (PEPCID) 40 MG tablet Take 1 tablet (40 mg total) by mouth every evening. 12/09/17 12/09/18  Rebecka Apley, MD  Insulin Lispro Prot & Lispro (HUMALOG MIX 75/25 KWIKPEN) (75-25) 100 UNIT/ML Kwikpen Inject 25 Units into the skin at bedtime.     [provider]  JANUVIA 100 MG tablet Take 100 mg by mouth daily. 03/04/17   [provider]  lidocaine (LIDODERM) 5 % Place 1 patch onto the skin every 12 (twelve) hours. Remove & Discard patch within 12 hours or as directed by MD 12/09/17 12/09/18  Rebecka Apley, MD  losartan (COZAAR) 50 MG tablet Take 50 mg by mouth daily.  01/23/15   [provider]  metFORMIN (GLUCOPHAGE-XR) 500 MG 24 hr tablet  Take 1,000 mg by mouth 2 (two) times daily.     [provider]  pantoprazole (PROTONIX) 40 MG tablet Take 40 mg by mouth daily. 03/07/17   [provider]  phenazopyridine (PYRIDIUM) 200 MG tablet Take 1 tablet (200 mg total) by mouth 3 (three) times daily as needed for pain. 12/28/17   Irean Hong, MD  spironolactone (ALDACTONE) 25 MG tablet Take 25 mg by mouth daily.    [provider]  torsemide (DEMADEX) 10 MG tablet Take 10 mg by mouth daily.  03/23/15   [provider]    Allergies Codeine  Family History  Problem Relation Age of Onset  . Alcohol abuse Brother     Social History Social History    Tobacco Use  . Smoking status: Former Games developer  . Smokeless tobacco: Current User    Types: Snuff  Substance Use Topics  . Alcohol use: Yes    Comment: occasonally  . Drug use: No    Review of Systems Constitutional: No fever/chills Eyes: No visual changes. ENT: No sore throat. Cardiovascular: Denies chest pain. Respiratory: Denies shortness of breath. Gastrointestinal: No abdominal pain.  No nausea, no vomiting.  No diarrhea.   Genitourinary: Negative for dysuria. Musculoskeletal: Positive for left buttock pain that radiates down left leg. Skin: Negative for rash. Neurological: Negative for headaches, focal weakness or numbness.  ____________________________________________   PHYSICAL EXAM:  VITAL SIGNS: ED Triage Vitals  Enc Vitals Group     BP 01/26/18 1407 134/66     Pulse Rate 01/26/18 1407 84     Resp 01/26/18 1407 18     Temp 01/26/18 1407 98.1 F (36.7 C)     Temp Source 01/26/18 1407 Oral     SpO2 01/26/18 1407 99 %     Weight 01/26/18 1408 230 lb (104.3 kg)     Height 01/26/18 1408 5\' 2"  (1.575 m)     Head Circumference --      Peak Flow --      Pain Score 01/26/18 1408 9   Constitutional: Alert and oriented. Well appearing and in no distress. Eyes: Conjunctivae are normal.  ENT   Head: Normocephalic and atraumatic.   Nose: No congestion/rhinnorhea.   Mouth/Throat: Mucous membranes are moist.   Neck: No stridor. Hematological/Lymphatic/Immunilogical: No cervical lymphadenopathy. Cardiovascular: Normal rate, regular rhythm.  No murmurs, rubs, or gallops.  Respiratory: Normal respiratory effort without tachypnea nor retractions. Breath sounds are clear and equal bilaterally. No wheezes/rales/rhonchi. Gastrointestinal: Soft and non tender. No rebound. No guarding.  Genitourinary: Deferred Musculoskeletal: Normal range of motion in all extremities. No lower extremity edema. Neurologic:  Normal speech and language. No gross focal neurologic  deficits are appreciated.  Skin:  Skin is warm, dry and intact. No rash noted. Psychiatric: Mood and affect are normal. Speech and behavior are normal. Patient exhibits appropriate insight and judgment.  ____________________________________________    LABS (pertinent positives/negatives)  None  ____________________________________________   EKG  None  ____________________________________________    RADIOLOGY  None  ____________________________________________   PROCEDURES  Procedures  ____________________________________________   INITIAL IMPRESSION / ASSESSMENT AND PLAN / ED COURSE  Pertinent labs & imaging results that were available during my care of the patient were reviewed by me and considered in my medical decision making (see chart for details).  Patient presented to the emergency department today because of concerns for left buttock pain that radiates down her left leg.  Clinical history and exam is consistent with sciatica.  I  had a discussion with the patient about sciatica.  Discussed possible use of steroids.  At this time she was interested.  Also discussed NSAID use.  Did discuss with patient given history of diabetes she has to be extremely vigilant about her sugars given steroid prescription. Patient verbalized understanding.  ____________________________________________   FINAL CLINICAL IMPRESSION(S) / ED DIAGNOSES  Final diagnoses:  Sciatica of left side     Note: This dictation was prepared with Dragon dictation. Any transcriptional errors that result from this process are unintentional     Phineas Semen, MD 01/26/18 [phone]

## 2018-01-26 NOTE — Discharge Instructions (Addendum)
Please seek medical attention for any high fevers, chest pain, shortness of breath, change in behavior, persistent vomiting, bloody stool or any other new or concerning symptoms.  

## 2018-03-31 ENCOUNTER — Encounter: Payer: Self-pay | Admitting: Urology

## 2018-03-31 ENCOUNTER — Ambulatory Visit: Payer: Self-pay | Admitting: Urology

## 2018-08-17 ENCOUNTER — Emergency Department: Payer: Medicare Other

## 2018-08-17 ENCOUNTER — Other Ambulatory Visit: Payer: Self-pay

## 2018-08-17 ENCOUNTER — Emergency Department
Admission: EM | Admit: 2018-08-17 | Discharge: 2018-08-17 | Disposition: A | Discharge status: Home / Self Care | Payer: Medicare Other | Attending: Emergency Medicine | Admitting: Emergency Medicine

## 2018-08-17 DIAGNOSIS — I110 Hypertensive heart disease with heart failure: Secondary | ICD-10-CM | POA: Diagnosis not present

## 2018-08-17 DIAGNOSIS — Y9389 Activity, other specified: Secondary | ICD-10-CM | POA: Insufficient documentation

## 2018-08-17 DIAGNOSIS — S0181XA Laceration without foreign body of other part of head, initial encounter: Secondary | ICD-10-CM | POA: Diagnosis not present

## 2018-08-17 DIAGNOSIS — W01198A Fall on same level from slipping, tripping and stumbling with subsequent striking against other object, initial encounter: Secondary | ICD-10-CM | POA: Insufficient documentation

## 2018-08-17 DIAGNOSIS — E119 Type 2 diabetes mellitus without complications: Secondary | ICD-10-CM | POA: Diagnosis not present

## 2018-08-17 DIAGNOSIS — S0990XA Unspecified injury of head, initial encounter: Secondary | ICD-10-CM | POA: Insufficient documentation

## 2018-08-17 DIAGNOSIS — Y999 Unspecified external cause status: Secondary | ICD-10-CM | POA: Diagnosis not present

## 2018-08-17 DIAGNOSIS — Z23 Encounter for immunization: Secondary | ICD-10-CM | POA: Diagnosis not present

## 2018-08-17 DIAGNOSIS — Y9289 Other specified places as the place of occurrence of the external cause: Secondary | ICD-10-CM | POA: Diagnosis not present

## 2018-08-17 DIAGNOSIS — M5489 Other dorsalgia: Secondary | ICD-10-CM | POA: Diagnosis not present

## 2018-08-17 DIAGNOSIS — Z794 Long term (current) use of insulin: Secondary | ICD-10-CM | POA: Insufficient documentation

## 2018-08-17 DIAGNOSIS — I509 Heart failure, unspecified: Secondary | ICD-10-CM | POA: Insufficient documentation

## 2018-08-17 DIAGNOSIS — Z7982 Long term (current) use of aspirin: Secondary | ICD-10-CM | POA: Diagnosis not present

## 2018-08-17 DIAGNOSIS — Z87891 Personal history of nicotine dependence: Secondary | ICD-10-CM | POA: Diagnosis not present

## 2018-08-17 MED ORDER — OXYCODONE-ACETAMINOPHEN 5-325 MG PO TABS
ORAL_TABLET | ORAL | Status: AC
  Filled 2018-08-17: qty 2

## 2018-08-17 MED ORDER — TETANUS-DIPHTH-ACELL PERTUSSIS 5-2.5-18.5 LF-MCG/0.5 IM SUSP
0.5000 mL | Freq: Once | INTRAMUSCULAR | Status: AC
  Administered 2018-08-17: 0.5 mL via INTRAMUSCULAR
  Filled 2018-08-17: qty 0.5

## 2018-08-17 MED ORDER — TRAMADOL HCL 50 MG PO TABS: 50.0000 mg | ORAL_TABLET | Freq: Four times a day (QID) | ORAL | 0 refills | Status: DC | PRN

## 2018-08-17 MED ORDER — OXYCODONE-ACETAMINOPHEN 5-325 MG PO TABS
2.0000 | ORAL_TABLET | Freq: Once | ORAL | Status: AC
  Administered 2018-08-17: 2 via ORAL

## 2018-08-17 NOTE — Discharge Instructions (Addendum)
You have suffered a laceration to your forehead which has been repaired with a glue per your request.  Please monitor this wound, if there is any signs of infection such as redness increased pain or fever please return to the emergency department or follow-up with your doctor.  Please keep the area dry for the next 48 hours.  Your tetanus shot has also been updated today.  Please follow-up with your doctor this week for recheck/reevaluation.

## 2018-08-17 NOTE — ED Notes (Signed)
Patient transported to CT 

## 2018-08-17 NOTE — ED Triage Notes (Signed)
Patient states "I had a few too many natural lites".  Patient with fall, laceration above right eye (denies loss of consciousness) and neck and back pain.

## 2018-08-17 NOTE — ED Provider Notes (Signed)
[facility] Emergency Department Provider Note  Time seen: 7:44 AM  I have reviewed the triage vital signs and the nursing notes.   HISTORY  Chief Complaint Fall and Laceration    HPI Betty Luna is a 67 y.o. female with a past medical history of arthritis, anemia, CHF, depression, diabetes, gastric reflux, presents to the emergency department after a fall with a head injury and laceration.  According to the patient she was drinking beer last night, states she thinks she had a couple too many, states she became off balance last night fell hitting her head.  Denies loss of consciousness.  Patient has approximate 3 cm laceration above the right eyebrow.  Patient has a large hematoma above the right eyebrow.  Patient states some back aching and soreness throughout her body but denies any other specific complaints.  Has been ambulatory without issue.   Past Medical History:  Diagnosis Date  . Anemia   . Arthritis   . CHF (congestive heart failure) (HCC)   . Depression   . Diabetes mellitus without complication (HCC)   . GERD (gastroesophageal reflux disease)   . Gout   . Hypertension     Patient Active Problem List   Diagnosis Date Noted  . Pyelonephritis 06/26/2017    Past Surgical History:  Procedure Laterality Date  . ABDOMINAL HYSTERECTOMY    . CATARACT EXTRACTION W/PHACO Right 04/16/2017   Procedure: CATARACT EXTRACTION PHACO AND INTRAOCULAR LENS PLACEMENT (IOC);  Surgeon: Galen Manila, MD;  Location: [facility] ORS;  Service: Ophthalmology;  Laterality: Right;  [place] 01:06 AP% 16.5 CDE 11.03 Fluid pack lot # [id number]  . CATARACT EXTRACTION W/PHACO Left 10/01/2017   Procedure: CATARACT EXTRACTION PHACO AND INTRAOCULAR LENS PLACEMENT (IOC)-LEFT DIABETIC;  Surgeon: Galen Manila, MD;  Location: [facility] ORS;  Service: Ophthalmology;  Laterality: Left;  [place] 01:05.7 AP% 12.2 CDE 7.42 Fluid pack lot # [id number]  . CTR Left   . TONSILLECTOMY       Prior to Admission medications   Medication Sig Start Date End Date Taking? Authorizing Provider  aspirin EC 81 MG tablet Take 81 mg by mouth daily.    [provider]  carvedilol (COREG) 25 MG tablet Take 25 mg by mouth 2 (two) times daily.  02/14/15   [provider]  cefUROXime (CEFTIN) 250 MG tablet Take 1 tablet (250 mg total) by mouth 2 (two) times daily with a meal. 12/28/17   Irean Hong, MD  colchicine 0.6 MG tablet Take 0.6 mg by mouth daily.    [provider]  diazepam (VALIUM) 2 MG tablet Take 1 tablet (2 mg total) by mouth every 6 (six) hours as needed for anxiety. 12/09/17   Rebecka Apley, MD  famotidine (PEPCID) 40 MG tablet Take 1 tablet (40 mg total) by mouth every evening. 12/09/17 12/09/18  Rebecka Apley, MD  Insulin Lispro Prot & Lispro (HUMALOG MIX 75/25 KWIKPEN) (75-25) 100 UNIT/ML Kwikpen Inject 25 Units into the skin at bedtime.     [provider]  JANUVIA 100 MG tablet Take 100 mg by mouth daily. 03/04/17   [provider]  lidocaine (LIDODERM) 5 % Place 1 patch onto the skin every 12 (twelve) hours. Remove & Discard patch within 12 hours or as directed by MD 12/09/17 12/09/18  Rebecka Apley, MD  losartan (COZAAR) 50 MG tablet Take 50 mg by mouth daily.  01/23/15   [provider]  metFORMIN (GLUCOPHAGE-XR) 500 MG 24 hr tablet Take  1,000 mg by mouth 2 (two) times daily.     [provider]  pantoprazole (PROTONIX) 40 MG tablet Take 40 mg by mouth daily. 03/07/17   [provider]  phenazopyridine (PYRIDIUM) 200 MG tablet Take 1 tablet (200 mg total) by mouth 3 (three) times daily as needed for pain. 12/28/17   Irean Hong, MD  predniSONE (STERAPRED UNI-PAK 21 TAB) 10 MG (21) TBPK tablet Instructions per packaging 01/26/18   Phineas Semen, MD  spironolactone (ALDACTONE) 25 MG tablet Take 25 mg by mouth daily.    [provider]  torsemide (DEMADEX) 10 MG tablet Take 10 mg by mouth  daily.  03/23/15   [provider]    Allergies  Allergen Reactions  . Codeine Other (See Comments)    Reaction: jittery     Family History  Problem Relation Age of Onset  . Alcohol abuse Brother     Social History Social History   Tobacco Use  . Smoking status: Former Games developer  . Smokeless tobacco: Current User    Types: Snuff  Substance Use Topics  . Alcohol use: Yes    Comment: occasonally  . Drug use: No    Review of Systems Constitutional: Negative for loss of consciousness Eyes: Negative for visual complaints Cardiovascular: Negative for chest pain. Respiratory: Negative for shortness of breath. Gastrointestinal: Negative for abdominal pain Musculoskeletal: Back aching/soreness.  Soreness in her muscles and joints. Skin: Laceration above right eyebrow Neurological: Negative for headache All other ROS negative  ____________________________________________   PHYSICAL EXAM:  VITAL SIGNS: ED Triage Vitals [08/17/18 0438]  Enc Vitals Group     BP 132/65     Pulse Rate 93     Resp 20     Temp 97.7 F (36.5 C)     Temp Source Oral     SpO2 98 %     Weight 221 lb (100.2 kg)     Height 5\' 2"  (1.575 m)     Head Circumference      Peak Flow      Pain Score 10     Pain Loc      Pain Edu?      Excl. in GC?    Constitutional: Alert and oriented. Well appearing and in no distress. Eyes: Normal exam ENT   Head: Patient is a moderate hematoma to the right forehead, laceration proximal me 3 cm above the right eyebrow, hemostatic.   Mouth/Throat: Mucous membranes are moist. Cardiovascular: Normal rate, regular rhythm. No murmur Respiratory: Normal respiratory effort without tachypnea nor retractions. Breath sounds are clear  Gastrointestinal: Soft and nontender. No distention.   Musculoskeletal: Mild tenderness of the mid thoracic spine, mild tenderness throughout the back muscles.  Good range of motion in all extremities without pain  elicited. Neurologic:  Normal speech and language. No gross focal neurologic deficits  Skin: 3 cm laceration above right eyebrow, hemostatic. Psychiatric: Mood and affect are normal   RADIOLOGY  CT scans and x-rays are negative for acute abnormality or fracture.  ____________________________________________   INITIAL IMPRESSION / ASSESSMENT AND PLAN / ED COURSE  Pertinent labs & imaging results that were available during my care of the patient were reviewed by me and considered in my medical decision making (see chart for details).  Patient presents to the emergency department after a fall admittedly intoxicated with alcohol.  Patient does have a 3 cm laceration above the right eyebrow, and a hematoma as well.  Patient is awake alert oriented  x4 has no specific complaints besides general body aching.  CT scan of the head and neck are negative for acute abnormality.  Back x-rays are negative for acute fracture.  I cleaned the patient's wounds, irrigated with fluid, it was hemostatic it did start bleeding mildly after cleaning.  I recommended sutures for the patient, she strongly poses and wishes for the cut to be glued shut.  There is good skin to skin contact is non-gaping so I believe it is amenable to skin adhesive repair.  Applied Dermabond and achieved hemostasis.  We will update the patient's tetanus as she does not know when her last tetanus shot was.  Patient will be discharged with PCP follow-up.  ____________________________________________   FINAL CLINICAL IMPRESSION(S) / ED DIAGNOSES  Fall Contusions Laceration    Minna Antis, MD 08/17/18 [phone]

## 2018-08-17 NOTE — ED Notes (Signed)
Patient resting quietly with eyes closed in no acute distress at this time.  

## 2018-09-21 ENCOUNTER — Emergency Department
Admission: EM | Admit: 2018-09-21 | Discharge: 2018-09-21 | Disposition: A | Discharge status: Home / Self Care | Payer: Medicare Other | Attending: Emergency Medicine | Admitting: Emergency Medicine

## 2018-09-21 ENCOUNTER — Encounter: Payer: Self-pay | Admitting: Emergency Medicine

## 2018-09-21 ENCOUNTER — Other Ambulatory Visit: Payer: Self-pay

## 2018-09-21 ENCOUNTER — Emergency Department: Payer: Medicare Other

## 2018-09-21 DIAGNOSIS — Z87891 Personal history of nicotine dependence: Secondary | ICD-10-CM | POA: Diagnosis not present

## 2018-09-21 DIAGNOSIS — S20211A Contusion of right front wall of thorax, initial encounter: Secondary | ICD-10-CM

## 2018-09-21 DIAGNOSIS — Z794 Long term (current) use of insulin: Secondary | ICD-10-CM | POA: Diagnosis not present

## 2018-09-21 DIAGNOSIS — E119 Type 2 diabetes mellitus without complications: Secondary | ICD-10-CM | POA: Diagnosis not present

## 2018-09-21 DIAGNOSIS — Y999 Unspecified external cause status: Secondary | ICD-10-CM | POA: Insufficient documentation

## 2018-09-21 DIAGNOSIS — M5489 Other dorsalgia: Secondary | ICD-10-CM | POA: Diagnosis present

## 2018-09-21 DIAGNOSIS — Y929 Unspecified place or not applicable: Secondary | ICD-10-CM | POA: Insufficient documentation

## 2018-09-21 DIAGNOSIS — I509 Heart failure, unspecified: Secondary | ICD-10-CM | POA: Diagnosis not present

## 2018-09-21 DIAGNOSIS — Z7982 Long term (current) use of aspirin: Secondary | ICD-10-CM | POA: Insufficient documentation

## 2018-09-21 DIAGNOSIS — Y939 Activity, unspecified: Secondary | ICD-10-CM | POA: Insufficient documentation

## 2018-09-21 DIAGNOSIS — W19XXXA Unspecified fall, initial encounter: Secondary | ICD-10-CM | POA: Insufficient documentation

## 2018-09-21 DIAGNOSIS — I110 Hypertensive heart disease with heart failure: Secondary | ICD-10-CM | POA: Diagnosis not present

## 2018-09-21 DIAGNOSIS — Z79899 Other long term (current) drug therapy: Secondary | ICD-10-CM | POA: Diagnosis not present

## 2018-09-21 MED ORDER — METHOCARBAMOL 500 MG PO TABS
1000.0000 mg | ORAL_TABLET | Freq: Once | ORAL | Status: AC
  Administered 2018-09-21: 1000 mg via ORAL
  Filled 2018-09-21: qty 2

## 2018-09-21 MED ORDER — KETOROLAC TROMETHAMINE 30 MG/ML IJ SOLN
30.0000 mg | Freq: Once | INTRAMUSCULAR | Status: AC
Start: 2018-09-21 — End: 2018-09-21
  Administered 2018-09-21: 30 mg via INTRAMUSCULAR
  Filled 2018-09-21: qty 1

## 2018-09-21 MED ORDER — METHOCARBAMOL 500 MG PO TABS: 500.0000 mg | ORAL_TABLET | Freq: Four times a day (QID) | ORAL | 0 refills | Status: DC | PRN

## 2018-09-21 MED ORDER — NAPROXEN 500 MG PO TABS: 500.0000 mg | ORAL_TABLET | Freq: Two times a day (BID) | ORAL | 0 refills | Status: DC

## 2018-09-21 NOTE — Discharge Instructions (Signed)
Follow-up with your primary care provider if any continued problems.  Begin taking Robaxin 500 mg 1 every 6 hours as needed for muscle spasms if not driving or operating machinery.  Naproxen 500 mg twice daily with food.  You may also take Tylenol with this medication if needed for additional pain relief.  Use ice to your ribs and muscles in your back if needed for discomfort.

## 2018-09-21 NOTE — ED Provider Notes (Signed)
[facility] Emergency Department Provider Note  ____________________________________________   First MD Initiated Contact with Patient 09/21/18 1055     (approximate)  I have reviewed the triage vital signs and the nursing notes.   HISTORY  Chief Complaint Back Pain   HPI Betty Luna is a 67 y.o. female presents to the ED with complaint of back pain.  Patient states that she had a fall 3 weeks ago and was evaluated with x-rays at that time.  She was told that her x-rays were negative.  Since that time she has developed some pain  in her rib area.  she also continues to have back pain with certain movements.  She describes them as being muscle spasms. This area was not x-rayed at the time of her fall.  Currently she rates her pain as an 8 out of 10.   Past Medical History:  Diagnosis Date  . Anemia   . Arthritis   . CHF (congestive heart failure) (HCC)   . Depression   . Diabetes mellitus without complication (HCC)   . GERD (gastroesophageal reflux disease)   . Gout   . Hypertension     Patient Active Problem List   Diagnosis Date Noted  . Pyelonephritis 06/26/2017    Past Surgical History:  Procedure Laterality Date  . ABDOMINAL HYSTERECTOMY    . CATARACT EXTRACTION W/PHACO Right 04/16/2017   Procedure: CATARACT EXTRACTION PHACO AND INTRAOCULAR LENS PLACEMENT (IOC);  Surgeon: Galen Manila, MD;  Location: [facility] ORS;  Service: Ophthalmology;  Laterality: Right;  [place] 01:06 AP% 16.5 CDE 11.03 Fluid pack lot # [id number]  . CATARACT EXTRACTION W/PHACO Left 10/01/2017   Procedure: CATARACT EXTRACTION PHACO AND INTRAOCULAR LENS PLACEMENT (IOC)-LEFT DIABETIC;  Surgeon: Galen Manila, MD;  Location: [facility] ORS;  Service: Ophthalmology;  Laterality: Left;  [place] 01:05.7 AP% 12.2 CDE 7.42 Fluid pack lot # [id number]  . CTR Left   . TONSILLECTOMY      Prior to Admission medications   Medication Sig Start Date End Date Taking? Authorizing  Provider  aspirin EC 81 MG tablet Take 81 mg by mouth daily.    [provider]  carvedilol (COREG) 25 MG tablet Take 25 mg by mouth 2 (two) times daily.  02/14/15   [provider]  cefUROXime (CEFTIN) 250 MG tablet Take 1 tablet (250 mg total) by mouth 2 (two) times daily with a meal. 12/28/17   Irean Hong, MD  colchicine 0.6 MG tablet Take 0.6 mg by mouth daily.    [provider]  diazepam (VALIUM) 2 MG tablet Take 1 tablet (2 mg total) by mouth every 6 (six) hours as needed for anxiety. 12/09/17   Rebecka Apley, MD  famotidine (PEPCID) 40 MG tablet Take 1 tablet (40 mg total) by mouth every evening. 12/09/17 12/09/18  Rebecka Apley, MD  Insulin Lispro Prot & Lispro (HUMALOG MIX 75/25 KWIKPEN) (75-25) 100 UNIT/ML Kwikpen Inject 25 Units into the skin at bedtime.     [provider]  JANUVIA 100 MG tablet Take 100 mg by mouth daily. 03/04/17   [provider]  lidocaine (LIDODERM) 5 % Place 1 patch onto the skin every 12 (twelve) hours. Remove & Discard patch within 12 hours or as directed by MD 12/09/17 12/09/18  Rebecka Apley, MD  losartan (COZAAR) 50 MG tablet Take 50 mg by mouth daily.  01/23/15   [provider]  metFORMIN (GLUCOPHAGE-XR) 500 MG 24 hr tablet Take 1,000 mg by  mouth 2 (two) times daily.     [provider]  methocarbamol (ROBAXIN) 500 MG tablet Take 1 tablet (500 mg total) by mouth every 6 (six) hours as needed. 09/21/18   Tommi Rumps, PA-C  naproxen (NAPROSYN) 500 MG tablet Take 1 tablet (500 mg total) by mouth 2 (two) times daily with a meal. 09/21/18   Bridget Hartshorn L, PA-C  pantoprazole (PROTONIX) 40 MG tablet Take 40 mg by mouth daily. 03/07/17   [provider]  phenazopyridine (PYRIDIUM) 200 MG tablet Take 1 tablet (200 mg total) by mouth 3 (three) times daily as needed for pain. 12/28/17   Irean Hong, MD  predniSONE (STERAPRED UNI-PAK 21 TAB) 10 MG (21) TBPK tablet Instructions per  packaging 01/26/18   Phineas Semen, MD  spironolactone (ALDACTONE) 25 MG tablet Take 25 mg by mouth daily.    [provider]  torsemide (DEMADEX) 10 MG tablet Take 10 mg by mouth daily.  03/23/15   [provider]  traMADol (ULTRAM) 50 MG tablet Take 1 tablet (50 mg total) by mouth every 6 (six) hours as needed. 08/17/18   Minna Antis, MD    Allergies Codeine  Family History  Problem Relation Age of Onset  . Alcohol abuse Brother     Social History Social History   Tobacco Use  . Smoking status: Former Games developer  . Smokeless tobacco: Current User    Types: Snuff  Substance Use Topics  . Alcohol use: Yes    Comment: occasonally  . Drug use: No    Review of Systems Constitutional: No fever/chills Eyes: No visual changes. ENT: No sore throat. Cardiovascular: Denies chest pain. Respiratory: Denies shortness of breath.  Positive for right rib pain. Gastrointestinal: No abdominal pain.  No nausea, no vomiting.  Genitourinary: Negative for dysuria. Musculoskeletal: Resolving low back pain. Skin: Negative for rash. Neurological: Negative for headaches, focal weakness or numbness. ___________________________________________   PHYSICAL EXAM:  VITAL SIGNS: ED Triage Vitals  Enc Vitals Group     BP 09/21/18 1033 (!) 188/84     Pulse Rate 09/21/18 1033 86     Resp 09/21/18 1033 18     Temp 09/21/18 1033 97.6 F (36.4 C)     Temp Source 09/21/18 1033 Oral     SpO2 09/21/18 1033 97 %     Weight 09/21/18 1035 221 lb (100.2 kg)     Height 09/21/18 1035 5\' 3"  (1.6 m)     Head Circumference --      Peak Flow --      Pain Score 09/21/18 1040 8     Pain Loc --      Pain Edu? --      Excl. in GC? --    Constitutional: Alert and oriented. Well appearing and in no acute distress. Eyes: Conjunctivae are normal. PERRL. EOMI. Head: Atraumatic. Nose: No congestion/rhinnorhea. Neck: No stridor.   Cardiovascular: Normal rate, regular rhythm. Grossly normal  heart sounds.  Good peripheral circulation. Respiratory: Normal respiratory effort.  No retractions. Lungs CTAB. Gastrointestinal: Soft and nontender. No distention.  Musculoskeletal: Examination of the right lateral ribs there is some tenderness however there is no gross deformity and no skin discoloration over the area.  No crepitus with palpation.  Patient is tender in the lower right lateral rib area.  No tenderness is noted on palpation of the lumbar spine.  Patient is amatory without any assistance. Neurologic:  Normal speech and language. No gross focal neurologic deficits are appreciated.  No gait instability. Skin:  Skin is warm, dry and intact. No rash noted. Psychiatric: Mood and affect are normal. Speech and behavior are normal.  ____________________________________________   LABS (all labs ordered are listed, but only abnormal results are displayed)  Labs Reviewed - No data to display RADIOLOGY   Official radiology report(s): Dg Ribs Unilateral W/chest Right  Result Date: 09/21/2018 CLINICAL DATA:  Right chest pain since a fall in late October, 2019. EXAM: RIGHT RIBS AND CHEST - 3+ VIEW COMPARISON:  PA and lateral chest 12/28/2017. FINDINGS: The lungs are clear. No pneumothorax or pleural effusion. Heart size is normal. Aortic atherosclerosis is noted. No fracture or other focal bony abnormality is identified. IMPRESSION: Negative for fracture.  No acute disease. Atherosclerosis. Electronically Signed   By: Drusilla Kanner M.D.   On: 09/21/2018 12:21    ____________________________________________   PROCEDURES  Procedure(s) performed: None  Procedures  Critical Care performed: No  ____________________________________________   INITIAL IMPRESSION / ASSESSMENT AND PLAN / ED COURSE  As part of my medical decision making, I reviewed the following data within the electronic MEDICAL RECORD NUMBER Notes from prior ED visits and [place] Controlled Substance Database  Patient  presents to the ED with complaint of right lateral rib pain.  She was seen on her initial injury that occurred approximately 3 weeks ago.  At that time her lumbar spine was x-rayed and she was given medication at that time.  She continues to have rib pain and states that it was not x-rayed.  Physical exam is reassuring and x-ray was negative for rib fractures.  Patient was discharged with prescription for Robaxin 500 mg every 6 hours if needed for muscle spasms and naproxen 500 mg twice daily with food.  She is encouraged to follow-up with her PCP if any continued problems. Patient was given Toradol 30 mg IM along with methocarbamol while in the ED and states that she is improved.  ____________________________________________   FINAL CLINICAL IMPRESSION(S) / ED DIAGNOSES  Final diagnoses:  Contusion of ribs, right, initial encounter  Fall, initial encounter     ED Discharge Orders         Ordered    methocarbamol (ROBAXIN) 500 MG tablet  Every 6 hours PRN     09/21/18 1236    naproxen (NAPROSYN) 500 MG tablet  2 times daily with meals     09/21/18 1236           Note:  This document was prepared using Dragon voice recognition software and may include unintentional dictation errors.    Tommi Rumps, PA-C 09/21/18 1651    Minna Antis, MD 09/26/18 [phone]

## 2018-09-21 NOTE — ED Notes (Signed)
Pt states history of "slipped disc".  States fell 3 weeks ago and has had increased pain since that time.  Pt states pain radiates round both sides.

## 2018-09-21 NOTE — ED Triage Notes (Signed)
Pt arrived with complaints of back pain. Pt stated the pain started after a mechanical fall 3 weeks prior. Pt states she was evaluated and xrays were negative for a break but she has been unable to alleviate the pain. Pt in NAD in triage.

## 2019-04-09 ENCOUNTER — Other Ambulatory Visit: Payer: Self-pay | Admitting: Internal Medicine

## 2019-04-09 DIAGNOSIS — Z1231 Encounter for screening mammogram for malignant neoplasm of breast: Secondary | ICD-10-CM

## 2019-05-18 ENCOUNTER — Other Ambulatory Visit: Payer: Self-pay | Admitting: Physical Medicine and Rehabilitation

## 2019-05-18 DIAGNOSIS — G8929 Other chronic pain: Secondary | ICD-10-CM

## 2019-05-29 ENCOUNTER — Other Ambulatory Visit: Payer: Self-pay

## 2019-05-29 ENCOUNTER — Ambulatory Visit
Admission: RE | Admit: 2019-05-29 | Discharge: 2019-05-29 | Disposition: A | Discharge status: Home / Self Care | Payer: Medicare Other | Source: Ambulatory Visit | Attending: Physical Medicine and Rehabilitation | Admitting: Physical Medicine and Rehabilitation

## 2019-05-29 DIAGNOSIS — M545 Low back pain: Secondary | ICD-10-CM | POA: Insufficient documentation

## 2019-05-29 DIAGNOSIS — G8929 Other chronic pain: Secondary | ICD-10-CM | POA: Diagnosis present

## 2019-06-21 ENCOUNTER — Emergency Department
Admission: EM | Admit: 2019-06-21 | Discharge: 2019-06-21 | Disposition: A | Discharge status: Home / Self Care | Payer: Medicare Other | Attending: Emergency Medicine | Admitting: Emergency Medicine

## 2019-06-21 ENCOUNTER — Encounter: Payer: Self-pay | Admitting: Emergency Medicine

## 2019-06-21 ENCOUNTER — Other Ambulatory Visit: Payer: Self-pay

## 2019-06-21 DIAGNOSIS — M545 Low back pain, unspecified: Secondary | ICD-10-CM

## 2019-06-21 DIAGNOSIS — I110 Hypertensive heart disease with heart failure: Secondary | ICD-10-CM | POA: Diagnosis not present

## 2019-06-21 DIAGNOSIS — G8929 Other chronic pain: Secondary | ICD-10-CM | POA: Insufficient documentation

## 2019-06-21 DIAGNOSIS — Z79899 Other long term (current) drug therapy: Secondary | ICD-10-CM | POA: Diagnosis not present

## 2019-06-21 DIAGNOSIS — E119 Type 2 diabetes mellitus without complications: Secondary | ICD-10-CM | POA: Diagnosis not present

## 2019-06-21 DIAGNOSIS — Z794 Long term (current) use of insulin: Secondary | ICD-10-CM | POA: Insufficient documentation

## 2019-06-21 DIAGNOSIS — F17228 Nicotine dependence, chewing tobacco, with other nicotine-induced disorders: Secondary | ICD-10-CM | POA: Insufficient documentation

## 2019-06-21 DIAGNOSIS — Z7982 Long term (current) use of aspirin: Secondary | ICD-10-CM | POA: Diagnosis not present

## 2019-06-21 DIAGNOSIS — I509 Heart failure, unspecified: Secondary | ICD-10-CM | POA: Diagnosis not present

## 2019-06-21 LAB — GLUCOSE, CAPILLARY: Glucose-Capillary: 159 mg/dL — ABNORMAL HIGH (ref 70–99)

## 2019-06-21 MED ORDER — OXYCODONE-ACETAMINOPHEN 5-325 MG PO TABS
1.0000 | ORAL_TABLET | Freq: Once | ORAL | Status: AC
  Administered 2019-06-21: 1 via ORAL
  Filled 2019-06-21: qty 1

## 2019-06-21 MED ORDER — ORPHENADRINE CITRATE 30 MG/ML IJ SOLN
60.0000 mg | Freq: Two times a day (BID) | INTRAMUSCULAR | Status: DC
  Administered 2019-06-21: 60 mg via INTRAMUSCULAR
  Filled 2019-06-21: qty 2

## 2019-06-21 MED ORDER — OXYCODONE-ACETAMINOPHEN 7.5-325 MG PO TABS: 1.0000 | ORAL_TABLET | ORAL | 0 refills | Status: DC | PRN

## 2019-06-21 MED ORDER — HYDROMORPHONE HCL 1 MG/ML IJ SOLN
1.0000 mg | Freq: Once | INTRAMUSCULAR | Status: AC
  Administered 2019-06-21: 20:00:00 1 mg via INTRAMUSCULAR
  Filled 2019-06-21: qty 1

## 2019-06-21 MED ORDER — DIPHENHYDRAMINE HCL 25 MG PO CAPS
25.0000 mg | ORAL_CAPSULE | Freq: Once | ORAL | Status: AC
  Administered 2019-06-21: 25 mg via ORAL
  Filled 2019-06-21: qty 1

## 2019-06-21 NOTE — ED Notes (Signed)
Pt verbalized understanding of discharge instructions. NAD at this time. 

## 2019-06-21 NOTE — Discharge Instructions (Addendum)
Follow-up with [facility] clinic.  Please call for an appointment.  Let them know that the injections are not helping at this time.  Return to the emergency department worsening

## 2019-06-21 NOTE — ED Notes (Signed)
Pt assisted up to commode. 

## 2019-06-21 NOTE — ED Triage Notes (Signed)
C/O right lower back pain x 1 day.  States has some injections to same area recently.

## 2019-06-21 NOTE — ED Provider Notes (Signed)
[facility] Emergency Department Provider Note  ____________________________________________   First MD Initiated Contact with Patient 06/21/19 1814     (approximate)  I have reviewed the triage vital signs and the nursing notes.   HISTORY  Chief Complaint Back Pain    HPI Betty Luna is a 68 y.o. female presents emergency department with C/o low back pain for 1 day, no known injury, pain is worse with movement, increased with bending over, denies numbness, tingling, or changes in bowel/urinary habits, patient had facet injections done [facility] last [facility].  She states the steroid worked for a little while but now the pain has returned and is much worse.  She states she feels like something is poking into her side.  Pain is increased with movement.  She states she took her medications from home but they did not help.  Using otc meds without relief Remainder ros neg   Past Medical History:  Diagnosis Date  . Anemia   . Arthritis   . CHF (congestive heart failure) (HCC)   . Depression   . Diabetes mellitus without complication (HCC)   . GERD (gastroesophageal reflux disease)   . Gout   . Hypertension     Patient Active Problem List   Diagnosis Date Noted  . Pyelonephritis 06/26/2017    Past Surgical History:  Procedure Laterality Date  . ABDOMINAL HYSTERECTOMY    . CATARACT EXTRACTION W/PHACO Right 04/16/2017   Procedure: CATARACT EXTRACTION PHACO AND INTRAOCULAR LENS PLACEMENT (IOC);  Surgeon: Galen Manila, MD;  Location: [facility] ORS;  Service: Ophthalmology;  Laterality: Right;  [place] 01:06 AP% 16.5 CDE 11.03 Fluid pack lot # [id number]  . CATARACT EXTRACTION W/PHACO Left 10/01/2017   Procedure: CATARACT EXTRACTION PHACO AND INTRAOCULAR LENS PLACEMENT (IOC)-LEFT DIABETIC;  Surgeon: Galen Manila, MD;  Location: [facility] ORS;  Service: Ophthalmology;  Laterality: Left;  [place] 01:05.7 AP% 12.2 CDE 7.42 Fluid pack lot # [id number]  . CTR  Left   . TONSILLECTOMY      Prior to Admission medications   Medication Sig Start Date End Date Taking? Authorizing Provider  aspirin EC 81 MG tablet Take 81 mg by mouth daily.    [provider]  carvedilol (COREG) 25 MG tablet Take 25 mg by mouth 2 (two) times daily.  02/14/15   [provider]  cefUROXime (CEFTIN) 250 MG tablet Take 1 tablet (250 mg total) by mouth 2 (two) times daily with a meal. 12/28/17   Irean Hong, MD  colchicine 0.6 MG tablet Take 0.6 mg by mouth daily.    [provider]  diazepam (VALIUM) 2 MG tablet Take 1 tablet (2 mg total) by mouth every 6 (six) hours as needed for anxiety. 12/09/17   Rebecka Apley, MD  famotidine (PEPCID) 40 MG tablet Take 1 tablet (40 mg total) by mouth every evening. 12/09/17 12/09/18  Rebecka Apley, MD  Insulin Lispro Prot & Lispro (HUMALOG MIX 75/25 KWIKPEN) (75-25) 100 UNIT/ML Kwikpen Inject 25 Units into the skin at bedtime.     [provider]  JANUVIA 100 MG tablet Take 100 mg by mouth daily. 03/04/17   [provider]  losartan (COZAAR) 50 MG tablet Take 50 mg by mouth daily.  01/23/15   [provider]  metFORMIN (GLUCOPHAGE-XR) 500 MG 24 hr tablet Take 1,000 mg by mouth 2 (two) times daily.     [provider]  methocarbamol (ROBAXIN) 500 MG tablet Take 1 tablet (500 mg total)  by mouth every 6 (six) hours as needed. 09/21/18   Tommi RumpsSummers, Rhonda L, PA-C  naproxen (NAPROSYN) 500 MG tablet Take 1 tablet (500 mg total) by mouth 2 (two) times daily with a meal. 09/21/18   Tommi RumpsSummers, Rhonda L, PA-C  oxyCODONE-acetaminophen (PERCOCET) 7.5-325 MG tablet Take 1 tablet by mouth every 4 (four) hours as needed for severe pain. 06/21/19 06/20/20  Fisher, Roselyn BeringSusan W, PA-C  pantoprazole (PROTONIX) 40 MG tablet Take 40 mg by mouth daily. 03/07/17   [provider]  phenazopyridine (PYRIDIUM) 200 MG tablet Take 1 tablet (200 mg total) by mouth 3 (three) times daily as needed for pain. 12/28/17    Irean HongSung, Jade J, MD  predniSONE (STERAPRED UNI-PAK 21 TAB) 10 MG (21) TBPK tablet Instructions per packaging 01/26/18   Phineas SemenGoodman, Graydon, MD  spironolactone (ALDACTONE) 25 MG tablet Take 25 mg by mouth daily.    [provider]  torsemide (DEMADEX) 10 MG tablet Take 10 mg by mouth daily.  03/23/15   [provider]  traMADol (ULTRAM) 50 MG tablet Take 1 tablet (50 mg total) by mouth every 6 (six) hours as needed. 08/17/18   Minna AntisPaduchowski, Kevin, MD    Allergies Codeine  Family History  Problem Relation Age of Onset  . Alcohol abuse Brother     Social History Social History   Tobacco Use  . Smoking status: Former Games developermoker  . Smokeless tobacco: Current User    Types: Snuff  Substance Use Topics  . Alcohol use: Yes    Comment: occasonally  . Drug use: No    Review of Systems  Constitutional: No fever/chills Eyes: No visual changes. ENT: No sore throat. Respiratory: Denies cough Genitourinary: Negative for dysuria. Musculoskeletal: Positive for back pain. Skin: Negative for rash.    ____________________________________________   PHYSICAL EXAM:  VITAL SIGNS: ED Triage Vitals  Enc Vitals Group     BP 06/21/19 1812 (!) 168/79     Pulse Rate 06/21/19 1812 94     Resp 06/21/19 1812 18     Temp 06/21/19 1812 98.1 F (36.7 C)     Temp Source 06/21/19 1812 Oral     SpO2 06/21/19 1812 98 %     Weight 06/21/19 1756 220 lb 14.4 oz (100.2 kg)     Height --      Head Circumference --      Peak Flow --      Pain Score 06/21/19 1756 8     Pain Loc --      Pain Edu? --      Excl. in GC? --     Constitutional: Alert and oriented. Well appearing and in no acute distress. Eyes: Conjunctivae are normal.  Head: Atraumatic. Nose: No congestion/rhinnorhea. Mouth/Throat: Mucous membranes are moist.   Neck:  supple no lymphadenopathy noted Cardiovascular: Normal rate, regular rhythm. Heart sounds are normal Respiratory: Normal respiratory effort.  No retractions,  lungs c t a  Abd: soft nontender bs normal all 4 quad GU: deferred Musculoskeletal: FROM all extremities, warm and well perfused.  Decreased rom of back due to discomfort, lumbar spine tender at L4-L5, 5/5 strength in great toes b/l, 5/5 strength in lower legs, n/v intact Neurologic:  Normal speech and language.  Skin:  Skin is warm, dry and intact. No rash noted. Psychiatric: Mood and affect are normal. Speech and behavior are normal.  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  Dilaudid 1 mg IM  Procedures    ____________________________________________   INITIAL IMPRESSION / ASSESSMENT AND PLAN / ED COURSE  Pertinent labs & imaging results that were available during my care of the patient were reviewed by me and considered in my medical decision making (see chart for details).   Patient 68 year old female presents emergency department with acute exasperation of chronic low back pain.  She states she had cortisone injections last week which did help but then yesterday the pain became worse.  She is very uncomfortable and her home medications were not working.  Physical exam patient does appear to be very uncomfortable, lumbar spine area is slightly tender to palpation.  Patient was given Norflex 60 mg IM, oxycodone 5/325 p.o. On recheck of the patient her pain has decreased from 10/10 to  7/10.  Dilaudid 1 mg IM was given.     Patient states she did have some relief with the Dilaudid.  She still feels the pain with movement.  However she is ready to go home at this time.  She will follow[facility] with [facility] as instructed.  She was given a prescription for Percocet 7.5/325.  She is to take these as needed.  Strict instructions were given to her to only use as needed.  Positional instructions to decrease pressure on her lower back were also given.  She is to return emergency department if worsening.  States she understands will comply.  Is discharged stable condition.  As part of my medical decision making, I reviewed the following data within the [place] notes reviewed and incorporated, Old chart reviewed, Notes from prior ED visits and [place] Controlled Substance Database  ____________________________________________   FINAL CLINICAL IMPRESSION(S) / ED DIAGNOSES  Final diagnoses:  Acute exacerbation of chronic low back pain      NEW MEDICATIONS STARTED DURING THIS VISIT:  New Prescriptions   OXYCODONE-ACETAMINOPHEN (PERCOCET) 7.5-325 MG TABLET    Take 1 tablet by mouth every 4 (four) hours as needed for severe pain.     Note:  This document was prepared using Dragon voice recognition software and may include unintentional dictation errors.     Versie Starks, PA-C 06/21/19 2033    Delman Kitten, MD 06/21/19 2046

## 2019-06-22 ENCOUNTER — Emergency Department: Payer: Medicare Other

## 2019-06-22 ENCOUNTER — Other Ambulatory Visit: Payer: Self-pay

## 2019-06-22 ENCOUNTER — Encounter: Payer: Self-pay | Admitting: Emergency Medicine

## 2019-06-22 ENCOUNTER — Observation Stay
Admission: EM | Admit: 2019-06-22 | Discharge: 2019-06-23 | Disposition: A | Discharge status: Home / Self Care | Payer: Medicare Other | Attending: Internal Medicine | Admitting: Internal Medicine

## 2019-06-22 DIAGNOSIS — Z885 Allergy status to narcotic agent status: Secondary | ICD-10-CM | POA: Diagnosis not present

## 2019-06-22 DIAGNOSIS — I700 Atherosclerosis of aorta: Secondary | ICD-10-CM | POA: Insufficient documentation

## 2019-06-22 DIAGNOSIS — Z791 Long term (current) use of non-steroidal anti-inflammatories (NSAID): Secondary | ICD-10-CM | POA: Insufficient documentation

## 2019-06-22 DIAGNOSIS — Z79899 Other long term (current) drug therapy: Secondary | ICD-10-CM | POA: Diagnosis not present

## 2019-06-22 DIAGNOSIS — F329 Major depressive disorder, single episode, unspecified: Secondary | ICD-10-CM | POA: Insufficient documentation

## 2019-06-22 DIAGNOSIS — R0902 Hypoxemia: Secondary | ICD-10-CM

## 2019-06-22 DIAGNOSIS — Z20828 Contact with and (suspected) exposure to other viral communicable diseases: Secondary | ICD-10-CM | POA: Diagnosis not present

## 2019-06-22 DIAGNOSIS — J189 Pneumonia, unspecified organism: Secondary | ICD-10-CM | POA: Insufficient documentation

## 2019-06-22 DIAGNOSIS — I5032 Chronic diastolic (congestive) heart failure: Secondary | ICD-10-CM | POA: Insufficient documentation

## 2019-06-22 DIAGNOSIS — E119 Type 2 diabetes mellitus without complications: Secondary | ICD-10-CM | POA: Insufficient documentation

## 2019-06-22 DIAGNOSIS — Z87891 Personal history of nicotine dependence: Secondary | ICD-10-CM | POA: Insufficient documentation

## 2019-06-22 DIAGNOSIS — Z888 Allergy status to other drugs, medicaments and biological substances status: Secondary | ICD-10-CM | POA: Diagnosis not present

## 2019-06-22 DIAGNOSIS — M5416 Radiculopathy, lumbar region: Secondary | ICD-10-CM | POA: Insufficient documentation

## 2019-06-22 DIAGNOSIS — M1990 Unspecified osteoarthritis, unspecified site: Secondary | ICD-10-CM | POA: Diagnosis not present

## 2019-06-22 DIAGNOSIS — Z7984 Long term (current) use of oral hypoglycemic drugs: Secondary | ICD-10-CM | POA: Insufficient documentation

## 2019-06-22 DIAGNOSIS — K219 Gastro-esophageal reflux disease without esophagitis: Secondary | ICD-10-CM | POA: Insufficient documentation

## 2019-06-22 DIAGNOSIS — M109 Gout, unspecified: Secondary | ICD-10-CM | POA: Diagnosis not present

## 2019-06-22 DIAGNOSIS — I110 Hypertensive heart disease with heart failure: Secondary | ICD-10-CM | POA: Insufficient documentation

## 2019-06-22 DIAGNOSIS — J9601 Acute respiratory failure with hypoxia: Secondary | ICD-10-CM | POA: Diagnosis present

## 2019-06-22 DIAGNOSIS — I10 Essential (primary) hypertension: Secondary | ICD-10-CM

## 2019-06-22 DIAGNOSIS — K5730 Diverticulosis of large intestine without perforation or abscess without bleeding: Secondary | ICD-10-CM | POA: Insufficient documentation

## 2019-06-22 DIAGNOSIS — R0602 Shortness of breath: Secondary | ICD-10-CM

## 2019-06-22 DIAGNOSIS — Z7982 Long term (current) use of aspirin: Secondary | ICD-10-CM | POA: Insufficient documentation

## 2019-06-22 LAB — COMPREHENSIVE METABOLIC PANEL
ALT: 16 U/L (ref 0–44)
AST: 15 U/L (ref 15–41)
Albumin: 3.6 g/dL (ref 3.5–5.0)
Alkaline Phosphatase: 77 U/L (ref 38–126)
Anion gap: 9 (ref 5–15)
BUN: 20 mg/dL (ref 8–23)
CO2: 30 mmol/L (ref 22–32)
Calcium: 9.6 mg/dL (ref 8.9–10.3)
Chloride: 101 mmol/L (ref 98–111)
Creatinine, Ser: 0.83 mg/dL (ref 0.44–1.00)
GFR calc Af Amer: >60 mL/min (ref >60)
GFR calc non Af Amer: >60 mL/min (ref >60)
Glucose, Bld: 115 mg/dL — ABNORMAL HIGH (ref 70–99)
Potassium: 3.7 mmol/L (ref 3.5–5.1)
Sodium: 140 mmol/L (ref 135–145)
Total Bilirubin: 0.7 mg/dL (ref 0.3–1.2)
Total Protein: 7.3 g/dL (ref 6.5–8.1)

## 2019-06-22 LAB — CBC WITH DIFFERENTIAL/PLATELET
Abs Immature Granulocytes: 0.03 10*3/uL (ref 0.00–0.07)
Basophils Absolute: 0 10*3/uL (ref 0.0–0.1)
Basophils Relative: 0 %
Eosinophils Absolute: 0.1 10*3/uL (ref 0.0–0.5)
Eosinophils Relative: 1 %
HCT: 39.2 % (ref 36.0–46.0)
Hemoglobin: 12.3 g/dL (ref 12.0–15.0)
Immature Granulocytes: 0 %
Lymphocytes Relative: 26 %
Lymphs Abs: 2.4 10*3/uL (ref 0.7–4.0)
MCH: 23.6 pg — ABNORMAL LOW (ref 26.0–34.0)
MCHC: 31.4 g/dL (ref 30.0–36.0)
MCV: 75.1 fL — ABNORMAL LOW (ref 80.0–100.0)
Monocytes Absolute: 1.1 10*3/uL — ABNORMAL HIGH (ref 0.1–1.0)
Monocytes Relative: 12 %
Neutro Abs: 5.5 10*3/uL (ref 1.7–7.7)
Neutrophils Relative %: 61 %
Platelets: 277 10*3/uL (ref 150–400)
RBC: 5.22 MIL/uL — ABNORMAL HIGH (ref 3.87–5.11)
RDW: 16.8 % — ABNORMAL HIGH (ref 11.5–15.5)
WBC: 9.2 10*3/uL (ref 4.0–10.5)
nRBC: 0 % (ref 0.0–0.2)

## 2019-06-22 LAB — BRAIN NATRIURETIC PEPTIDE: B Natriuretic Peptide: 150 pg/mL — ABNORMAL HIGH (ref 0.0–100.0)

## 2019-06-22 LAB — URINALYSIS, COMPLETE (UACMP) WITH MICROSCOPIC
Bilirubin Urine: NEGATIVE
Glucose, UA: NEGATIVE mg/dL
Hgb urine dipstick: NEGATIVE
Ketones, ur: NEGATIVE mg/dL
Nitrite: NEGATIVE
Protein, ur: NEGATIVE mg/dL
Specific Gravity, Urine: 1.015 (ref 1.005–1.030)
pH: 6 (ref 5.0–8.0)

## 2019-06-22 LAB — GLUCOSE, CAPILLARY
Glucose-Capillary: 209 mg/dL — ABNORMAL HIGH (ref 70–99)
Glucose-Capillary: 311 mg/dL — ABNORMAL HIGH (ref 70–99)

## 2019-06-22 LAB — TROPONIN I (HIGH SENSITIVITY)
Troponin I (High Sensitivity): 17 ng/L (ref <18)
Troponin I (High Sensitivity): 16 ng/L (ref <18)

## 2019-06-22 LAB — SARS CORONAVIRUS 2 BY RT PCR (HOSPITAL ORDER, PERFORMED IN ~~LOC~~ HOSPITAL LAB): SARS Coronavirus 2: NEGATIVE

## 2019-06-22 MED ORDER — ASPIRIN EC 81 MG PO TBEC
81.0000 mg | DELAYED_RELEASE_TABLET | Freq: Every day | ORAL | Status: DC
  Administered 2019-06-23: 81 mg via ORAL
  Filled 2019-06-22: qty 1

## 2019-06-22 MED ORDER — GLIMEPIRIDE 2 MG PO TABS
2.0000 mg | ORAL_TABLET | Freq: Every day | ORAL | Status: DC
  Administered 2019-06-23: 09:00:00 2 mg via ORAL
  Filled 2019-06-22: qty 1

## 2019-06-22 MED ORDER — LOSARTAN POTASSIUM 50 MG PO TABS
50.0000 mg | ORAL_TABLET | Freq: Every day | ORAL | Status: DC
  Administered 2019-06-23: 09:00:00 50 mg via ORAL
  Filled 2019-06-22: qty 1

## 2019-06-22 MED ORDER — FUROSEMIDE 10 MG/ML IJ SOLN
40.0000 mg | Freq: Once | INTRAMUSCULAR | Status: AC
  Administered 2019-06-22: 40 mg via INTRAVENOUS
  Filled 2019-06-22: qty 4

## 2019-06-22 MED ORDER — MORPHINE SULFATE (PF) 4 MG/ML IV SOLN
6.0000 mg | Freq: Once | INTRAVENOUS | Status: AC
  Administered 2019-06-22: 14:00:00 6 mg via INTRAVENOUS
  Filled 2019-06-22 (×2): qty 2

## 2019-06-22 MED ORDER — OXYCODONE-ACETAMINOPHEN 5-325 MG PO TABS: 1.0000 | ORAL_TABLET | ORAL | Status: DC | PRN

## 2019-06-22 MED ORDER — DIAZEPAM 2 MG PO TABS: 2.0000 mg | ORAL_TABLET | Freq: Four times a day (QID) | ORAL | Status: DC | PRN

## 2019-06-22 MED ORDER — DEXAMETHASONE SODIUM PHOSPHATE 10 MG/ML IJ SOLN
10.0000 mg | Freq: Once | INTRAMUSCULAR | Status: AC
  Administered 2019-06-22: 10 mg via INTRAVENOUS
  Filled 2019-06-22: qty 1

## 2019-06-22 MED ORDER — INSULIN ASPART 100 UNIT/ML ~~LOC~~ SOLN
0.0000 [IU] | Freq: Every day | SUBCUTANEOUS | Status: DC
  Administered 2019-06-22: 2 [IU] via SUBCUTANEOUS
  Filled 2019-06-22: qty 1

## 2019-06-22 MED ORDER — COLCHICINE 0.6 MG PO TABS
0.6000 mg | ORAL_TABLET | Freq: Every day | ORAL | Status: DC
  Administered 2019-06-23: 0.6 mg via ORAL
  Filled 2019-06-22: qty 1

## 2019-06-22 MED ORDER — TORSEMIDE 20 MG PO TABS
10.0000 mg | ORAL_TABLET | Freq: Every day | ORAL | Status: DC
  Administered 2019-06-23: 10 mg via ORAL
  Filled 2019-06-22: qty 1

## 2019-06-22 MED ORDER — METFORMIN HCL ER 500 MG PO TB24
1000.0000 mg | ORAL_TABLET | Freq: Two times a day (BID) | ORAL | Status: DC
  Administered 2019-06-23: 1000 mg via ORAL
  Filled 2019-06-22 (×2): qty 2

## 2019-06-22 MED ORDER — CYCLOBENZAPRINE HCL 10 MG PO TABS: 5.0000 mg | ORAL_TABLET | Freq: Three times a day (TID) | ORAL | Status: DC | PRN

## 2019-06-22 MED ORDER — HYDRALAZINE HCL 20 MG/ML IJ SOLN: 10.0000 mg | Freq: Four times a day (QID) | INTRAMUSCULAR | Status: DC | PRN

## 2019-06-22 MED ORDER — ESCITALOPRAM OXALATE 10 MG PO TABS
20.0000 mg | ORAL_TABLET | Freq: Every day | ORAL | Status: DC
  Administered 2019-06-23: 20 mg via ORAL
  Filled 2019-06-22: qty 2

## 2019-06-22 MED ORDER — HEPARIN SODIUM (PORCINE) 5000 UNIT/ML IJ SOLN
5000.0000 [IU] | Freq: Three times a day (TID) | INTRAMUSCULAR | Status: DC
  Administered 2019-06-22 – 2019-06-23 (×2): 5000 [IU] via SUBCUTANEOUS
  Filled 2019-06-22 (×2): qty 1

## 2019-06-22 MED ORDER — INSULIN ASPART 100 UNIT/ML ~~LOC~~ SOLN
0.0000 [IU] | Freq: Three times a day (TID) | SUBCUTANEOUS | Status: DC
  Administered 2019-06-22: 21:00:00 7 [IU] via SUBCUTANEOUS
  Administered 2019-06-23: 08:00:00 2 [IU] via SUBCUTANEOUS
  Filled 2019-06-22 (×2): qty 1

## 2019-06-22 MED ORDER — CARVEDILOL 25 MG PO TABS
25.0000 mg | ORAL_TABLET | Freq: Two times a day (BID) | ORAL | Status: DC
  Administered 2019-06-22 – 2019-06-23 (×2): 25 mg via ORAL
  Filled 2019-06-22 (×2): qty 1

## 2019-06-22 MED ORDER — IOHEXOL 300 MG/ML  SOLN
100.0000 mL | Freq: Once | INTRAMUSCULAR | Status: AC | PRN
  Administered 2019-06-22: 13:00:00 100 mL via INTRAVENOUS

## 2019-06-22 MED ORDER — ONDANSETRON HCL 4 MG/2ML IJ SOLN
4.0000 mg | Freq: Once | INTRAMUSCULAR | Status: AC
  Administered 2019-06-22: 13:00:00 4 mg via INTRAVENOUS
  Filled 2019-06-22: qty 2

## 2019-06-22 MED ORDER — SODIUM CHLORIDE 0.9 % IV SOLN
500.0000 mg | INTRAVENOUS | Status: DC
  Administered 2019-06-22: 500 mg via INTRAVENOUS
  Filled 2019-06-22 (×2): qty 500

## 2019-06-22 MED ORDER — LINAGLIPTIN 5 MG PO TABS
5.0000 mg | ORAL_TABLET | Freq: Every day | ORAL | Status: DC
  Administered 2019-06-23: 09:00:00 5 mg via ORAL
  Filled 2019-06-22: qty 1

## 2019-06-22 MED ORDER — SPIRONOLACTONE 25 MG PO TABS
25.0000 mg | ORAL_TABLET | Freq: Every day | ORAL | Status: DC
  Administered 2019-06-23: 09:00:00 25 mg via ORAL
  Filled 2019-06-22: qty 1

## 2019-06-22 MED ORDER — PANTOPRAZOLE SODIUM 40 MG PO TBEC
40.0000 mg | DELAYED_RELEASE_TABLET | Freq: Every day | ORAL | Status: DC
  Administered 2019-06-23: 09:00:00 40 mg via ORAL
  Filled 2019-06-22: qty 1

## 2019-06-22 MED ORDER — SODIUM CHLORIDE 0.9 % IV SOLN
2.0000 g | INTRAVENOUS | Status: DC
  Administered 2019-06-22: 2 g via INTRAVENOUS
  Filled 2019-06-22 (×2): qty 20

## 2019-06-22 MED ORDER — DOCUSATE SODIUM 100 MG PO CAPS: 100.0000 mg | ORAL_CAPSULE | Freq: Two times a day (BID) | ORAL | Status: DC | PRN

## 2019-06-22 MED ORDER — NAPROXEN 500 MG PO TABS
500.0000 mg | ORAL_TABLET | Freq: Two times a day (BID) | ORAL | Status: DC
  Administered 2019-06-23: 09:00:00 500 mg via ORAL
  Filled 2019-06-22 (×2): qty 1

## 2019-06-22 NOTE — Progress Notes (Signed)
Family Meeting Note  Advance Directive:yes  Today a meeting took place with the Patient.  The following clinical team members were present during this meeting:MD  The following were discussed:Patient's diagnosis:Pneumonia, CHF, Htn, DM , Patient's progosis: Unable to determine and Goals for treatment: Full Code  Additional follow-up to be provided: PMD  Time spent during discussion:20 minutes  Vaughan Basta, MD

## 2019-06-22 NOTE — ED Provider Notes (Addendum)
[facility] Emergency Department Provider Note  ____________________________________________   First MD Initiated Contact with Patient 06/22/19 1047     (approximate)  I have reviewed the triage vital signs and the nursing notes.   HISTORY  Chief Complaint Back Pain    HPI Betty Luna is a 68 y.o. female with history of CHF, hypertension, here with back pain and mild shortness of breath.  Patient's primary complaint is ongoing right lower back pain.  She has known severe degenerative disease and just underwent steroid injection.  She states that she was seen here overnight and given pain meds.  However, she was unable to take these due to nausea and does have subsequently developed persistent, worsening, severe, aching, throbbing, right lower back pain.  Is similar to her chronic pain, though worse.  She denies any fevers or chills or drainage.  No redness.  No wound at the injection site.  She states that she is also felt short of breath, which she thought was due to pain, but this is somewhat persisted.  She said increasing leg swelling as well as shortness of breath with exertion and lying flat for the last several days.  This seems to correlate with her increase in pain.  She is not been taking her blood pressure at home.  No other complaints.  She is going to call her primary care physician to see if she can take more Lasix today.        Past Medical History:  Diagnosis Date  . Anemia   . Arthritis   . CHF (congestive heart failure) (HCC)   . Depression   . Diabetes mellitus without complication (HCC)   . GERD (gastroesophageal reflux disease)   . Gout   . Hypertension     Patient Active Problem List   Diagnosis Date Noted  . Pyelonephritis 06/26/2017    Past Surgical History:  Procedure Laterality Date  . ABDOMINAL HYSTERECTOMY    . CATARACT EXTRACTION W/PHACO Right 04/16/2017   Procedure: CATARACT EXTRACTION PHACO AND INTRAOCULAR LENS  PLACEMENT (IOC);  Surgeon: Galen ManilaPorfilio, William, MD;  Location: [facility] ORS;  Service: Ophthalmology;  Laterality: Right;  US 01:06 AP% 16.5 CDE 11.03 Fluid pack lot # [id number]  . CATARACT EXTRACTION W/PHACO Left 10/01/2017   Procedure: CATARACT EXTRACTION PHACO AND INTRAOCULAR LENS PLACEMENT (IOC)-LEFT DIABETIC;  Surgeon: Galen ManilaPorfilio, William, MD;  Location: [facility] ORS;  Service: Ophthalmology;  Laterality: Left;  US 01:05.7 AP% 12.2 CDE 7.42 Fluid pack lot # [id number]  . CTR Left   . TONSILLECTOMY      Prior to Admission medications   Medication Sig Start Date End Date Taking? Authorizing Provider  aspirin EC 81 MG tablet Take 81 mg by mouth daily.   Yes [provider]  carvedilol (COREG) 25 MG tablet Take 25 mg by mouth 2 (two) times daily.  02/14/15  Yes [provider]  escitalopram (LEXAPRO) 20 MG tablet Take 20 mg by mouth daily. 04/09/19 07/08/19 Yes [provider]  glimepiride (AMARYL) 2 MG tablet Take 2 mg by mouth daily. With breakfast 10/28/18 10/28/19 Yes [provider]  losartan (COZAAR) 50 MG tablet Take 50 mg by mouth daily.  01/23/15  Yes [provider]  metFORMIN (GLUCOPHAGE-XR) 500 MG 24 hr tablet Take 1,000 mg by mouth 2 (two) times daily.    Yes [provider]  pantoprazole (PROTONIX) 40 MG tablet Take 40 mg by mouth daily. 03/07/17  Yes [provider]  spironolactone (ALDACTONE) 25 MG  tablet Take 25 mg by mouth daily.   Yes [provider]  torsemide (DEMADEX) 10 MG tablet Take 10 mg by mouth daily.  03/23/15  Yes [provider]  colchicine 0.6 MG tablet Take 0.6 mg by mouth daily.    [provider]  diazepam (VALIUM) 2 MG tablet Take 1 tablet (2 mg total) by mouth every 6 (six) hours as needed for anxiety. 12/09/17   Rebecka ApleyWebster, Allison P, MD  famotidine (PEPCID) 40 MG tablet Take 1 tablet (40 mg total) by mouth every evening. 12/09/17 12/09/18  Rebecka ApleyWebster, Allison P, MD  JANUVIA 100 MG tablet Take 100  mg by mouth daily. 03/04/17   [provider]  methocarbamol (ROBAXIN) 500 MG tablet Take 1 tablet (500 mg total) by mouth every 6 (six) hours as needed. Patient not taking: Reported on 06/22/2019 09/21/18   Tommi RumpsSummers, Rhonda L, PA-C  naproxen (NAPROSYN) 500 MG tablet Take 1 tablet (500 mg total) by mouth 2 (two) times daily with a meal. Patient not taking: Reported on 06/22/2019 09/21/18   Tommi RumpsSummers, Rhonda L, PA-C  oxyCODONE-acetaminophen (PERCOCET) 7.5-325 MG tablet Take 1 tablet by mouth every 4 (four) hours as needed for severe pain. 06/21/19 06/20/20  Fisher, Roselyn BeringSusan W, PA-C  phenazopyridine (PYRIDIUM) 200 MG tablet Take 1 tablet (200 mg total) by mouth 3 (three) times daily as needed for pain. Patient not taking: Reported on 06/22/2019 12/28/17   Irean HongSung, Jade J, MD  traMADol (ULTRAM) 50 MG tablet Take 1 tablet (50 mg total) by mouth every 6 (six) hours as needed. Patient not taking: Reported on 06/22/2019 08/17/18   Minna AntisPaduchowski, Kevin, MD    Allergies Omeprazole, Atorvastatin, and Codeine  Family History  Problem Relation Age of Onset  . Alcohol abuse Brother     Social History Social History   Tobacco Use  . Smoking status: Former Games developermoker  . Smokeless tobacco: Current User    Types: Snuff  Substance Use Topics  . Alcohol use: Yes    Comment: occasonally  . Drug use: No    Review of Systems  Review of Systems  Constitutional: Positive for fatigue. Negative for fever.  HENT: Negative for congestion and sore throat.   Eyes: Negative for visual disturbance.  Respiratory: Positive for cough and shortness of breath.   Cardiovascular: Positive for leg swelling. Negative for chest pain.  Gastrointestinal: Negative for abdominal pain, diarrhea, nausea and vomiting.  Genitourinary: Negative for flank pain.  Musculoskeletal: Negative for back pain and neck pain.  Skin: Negative for rash and wound.  Neurological: Positive for weakness.  All other systems reviewed and are negative.     ____________________________________________  PHYSICAL EXAM:      VITAL SIGNS: ED Triage Vitals  Enc Vitals Group     BP 06/22/19 0834 (!) 178/88     Pulse Rate 06/22/19 0834 97     Resp 06/22/19 0834 (!) 24     Temp 06/22/19 0834 98.2 F (36.8 C)     Temp Source 06/22/19 0834 Oral     SpO2 06/22/19 0834 97 %     Weight 06/22/19 0841 235 lb (106.6 kg)     Height 06/22/19 0841 5\' 2"  (1.575 m)     Head Circumference --      Peak Flow --      Pain Score 06/22/19 0841 10     Pain Loc --      Pain Edu? --      Excl. in GC? --  Physical Exam Vitals signs and nursing note reviewed.  Constitutional:      General: She is not in acute distress.    Appearance: She is well-developed.  HENT:     Head: Normocephalic and atraumatic.  Eyes:     Conjunctiva/sclera: Conjunctivae normal.  Neck:     Musculoskeletal: Neck supple.  Cardiovascular:     Rate and Rhythm: Normal rate and regular rhythm.     Heart sounds: Normal heart sounds. No murmur. No friction rub.  Pulmonary:     Effort: Pulmonary effort is normal. No respiratory distress.     Breath sounds: Rales present. No wheezing.  Abdominal:     General: There is no distension.     Palpations: Abdomen is soft.     Tenderness: There is no abdominal tenderness.  Musculoskeletal:     Right lower leg: Edema present.     Left lower leg: Edema present.     Comments: Right paraspinal TTP over SI joint, right lower lumbosacral spine area  Skin:    General: Skin is warm.     Capillary Refill: Capillary refill takes less than 2 seconds.     Findings: No rash.  Neurological:     Mental Status: She is alert and oriented to person, place, and time.     Motor: No abnormal muscle tone.       ____________________________________________   LABS (all labs ordered are listed, but only abnormal results are displayed)  Labs Reviewed  CBC WITH DIFFERENTIAL/PLATELET - Abnormal; Notable for the following components:      Result  Value   RBC 5.22 (*)    MCV 75.1 (*)    MCH 23.6 (*)    RDW 16.8 (*)    Monocytes Absolute 1.1 (*)    All other components within normal limits  URINALYSIS, COMPLETE (UACMP) WITH MICROSCOPIC - Abnormal; Notable for the following components:   Color, Urine YELLOW (*)    APPearance HAZY (*)    Leukocytes,Ua SMALL (*)    Bacteria, UA RARE (*)    Non Squamous Epithelial PRESENT (*)    All other components within normal limits  COMPREHENSIVE METABOLIC PANEL - Abnormal; Notable for the following components:   Glucose, Bld 115 (*)    All other components within normal limits  BRAIN NATRIURETIC PEPTIDE - Abnormal; Notable for the following components:   B Natriuretic Peptide 150.0 (*)    All other components within normal limits  SARS CORONAVIRUS 2 (HOSPITAL ORDER, [place] [facility])  TROPONIN I (HIGH SENSITIVITY)    ____________________________________________  EKG: NSR, noa cute ischemic changes. ________________________________________  RADIOLOGY All imaging, including plain films, CT scans, and ultrasounds, independently reviewed by me, and interpretations confirmed via formal radiology reads.  ED MD interpretation:   CXR: Left basilar infiltrate, increased interstitial infiltrates  CT: No acute abnormality  Official radiology report(s): Dg Chest 2 View  Result Date: 06/22/2019 CLINICAL DATA:  Chronic dry cough. Pt states she had an episode of SOB 1 wk ago and again today. Pt here for continued low back/right flank pain. Hx - CHF, DM, HTN, former smoker, currently uses snuff. EXAM: CHEST - 2 VIEW COMPARISON:  09/21/2018, 60109 FINDINGS: Heart size is normal. There is prominence of interstitial markings throughout the lungs, increased compared to prior study. More focal opacity in the LEFT lung base may represent atelectasis or infiltrate. Mild perihilar peribronchial thickening. IMPRESSION: Increased prominence of interstitial markings, consistent with viral or  reactive airways disease. LEFT LOWER  lobe atelectasis or infiltrate. Electronically Signed   By: Norva PavlovElizabeth  Brown M.D.   On: 06/22/2019 14:31   Ct Abdomen Pelvis W Contrast  Result Date: 06/22/2019 CLINICAL DATA:  Acute generalized abdominal pain. EXAM: CT ABDOMEN AND PELVIS WITH CONTRAST TECHNIQUE: Multidetector CT imaging of the abdomen and pelvis was performed using the standard protocol following bolus administration of intravenous contrast. CONTRAST:  100mL OMNIPAQUE IOHEXOL 300 MG/ML  SOLN COMPARISON:  CT scan of December 28, 2017. FINDINGS: Lower chest: No acute abnormality. Hepatobiliary: No focal liver abnormality is seen. No gallstones, gallbladder wall thickening, or biliary dilatation. Pancreas: Unremarkable. No pancreatic ductal dilatation or surrounding inflammatory changes. Spleen: Normal in size without focal abnormality. Adrenals/Urinary Tract: Adrenal glands appear normal. Stable nonobstructive calculus seen in lower pole of left kidney. No hydronephrosis or renal obstruction is noted. Urinary bladder is unremarkable. Stomach/Bowel: Stomach is within normal limits. Appendix appears normal. No evidence of bowel wall thickening, distention, or inflammatory changes. Sigmoid diverticulosis is noted without inflammation. Vascular/Lymphatic: Aortic atherosclerosis. No enlarged abdominal or pelvic lymph nodes. Reproductive: Status post hysterectomy. No adnexal masses. Other: No abdominal wall hernia or abnormality. No abdominopelvic ascites. Musculoskeletal: No acute or significant osseous findings. IMPRESSION: Stable nonobstructive left renal calculus. No hydronephrosis or renal obstruction is noted. Sigmoid diverticulosis without inflammation. Aortic Atherosclerosis (ICD10-I70.0). Electronically Signed   By: Lupita RaiderJames  Green Jr M.D.   On: 06/22/2019 13:14    ____________________________________________  PROCEDURES   Procedure(s) performed (including Critical Care):  .Critical Care Performed by:  Shaune PollackIsaacs, Ell Tiso, MD Authorized by: Shaune PollackIsaacs, Jamiesha Victoria, MD   Critical care provider statement:    Critical care time (minutes):  35   Critical care time was exclusive of:  Separately billable procedures and treating other patients and teaching time   Critical care was necessary to treat or prevent imminent or life-threatening deterioration of the following conditions:  Cardiac failure and circulatory failure   Critical care was time spent personally by me on the following activities:  Development of treatment plan with patient or surrogate, discussions with consultants, evaluation of patient's response to treatment, examination of patient, obtaining history from patient or surrogate, ordering and performing treatments and interventions, ordering and review of laboratory studies, ordering and review of radiographic studies, pulse oximetry, re-evaluation of patient's condition and review of old charts   I assumed direction of critical care for this patient from another provider in my specialty: no      ____________________________________________  INITIAL IMPRESSION / MDM / ASSESSMENT AND PLAN / ED COURSE  As part of my medical decision making, I reviewed the following data within the electronic MEDICAL RECORD NUMBER Notes from prior ED visits and [place] Controlled Substance Database      *Betty Lundborgatricia Ruark was evaluated in Emergency Department on 06/22/2019 for the symptoms described in the history of present illness. She was evaluated in the context of the global COVID-19 pandemic, which necessitated consideration that the patient might be at risk for infection with the SARS-CoV-2 virus that causes COVID-19. Institutional protocols and algorithms that pertain to the evaluation of patients at risk for COVID-19 are in a state of rapid change based on information released by regulatory bodies including the CDC and federal and state organizations. These policies and algorithms were followed during the patient's  care in the ED.  Some ED evaluations and interventions may be delayed as a result of limited staffing during the pandemic.*      Medical Decision Making: 68 year old female here with multiple complaints.  Her primary  complaint is back pain which I suspect is due to her known radicular disease.  She just received steroid injections which I suspect are not adequately treating her pain.  However, she is significantly hypertensive, tachypneic, and hypoxic to 86 with good waveform here.  This was repeated multiple times and the patient had not been given opiates yet and was sitting upright, awake.  Chest x-ray does show increased interstitial pattern.  I suspect she very well could have acute on chronic CHF and she does endorse orthopnea, leg edema.  Will treat with Lasix and admit for hypoxia, which I suspect is due to hypertensive urgency related to her pain.  No red flags regarding her back pain.  CT scan shows no alternative etiologies.  She does have some mild pyuria but this is a chronic issue and she denies any other urinary symptoms and I do not suspect pyelonephritis clinically.  ____________________________________________  FINAL CLINICAL IMPRESSION(S) / ED DIAGNOSES  Final diagnoses:  Hypoxia  Acute respiratory failure with hypoxia (HCC)  Lumbar radiculopathy     MEDICATIONS GIVEN DURING THIS VISIT:  Medications  furosemide (LASIX) injection 40 mg (has no administration in time range)  dexamethasone (DECADRON) injection 10 mg (10 mg Intravenous Given 06/22/19 1229)  ondansetron (ZOFRAN) injection 4 mg (4 mg Intravenous Given 06/22/19 1232)  morphine 4 MG/ML injection 6 mg (6 mg Intravenous Given 06/22/19 1355)  iohexol (OMNIPAQUE) 300 MG/ML solution 100 mL (100 mLs Intravenous Contrast Given 06/22/19 1255)     ED Discharge Orders    None       Note:  This document was prepared using Dragon voice recognition software and may include unintentional dictation errors.   Shaune Pollack, MD 06/22/19 [place]    Shaune Pollack, MD 07/13/19 1130

## 2019-06-22 NOTE — ED Triage Notes (Signed)
Patient reports chronic lower back pain. States she had steroid injection 1 week ago which helped for about a day. Since then, pain has been getting progressively worse. Seen for same last night.

## 2019-06-22 NOTE — ED Notes (Signed)
This Rn at bedside, started IV, and administered medications. Pt refused morphine after this RN drew up medication, states she wants to see how she feels with IV decadron first. MD made aware. Medications wasted with Rush Landmark, RN. Pt A&O x4. Pt's husband at bedside at this time.

## 2019-06-22 NOTE — H&P (Signed)
[facility] [place] [facility]   PATIENT NAME: Betty Lundborgatricia Betty    MR#:  [id number]  DATE OF BIRTH:  04/27/1951  DATE OF ADMISSION:  06/22/2019  PRIMARY CARE PHYSICIAN: Lauro RegulusAnderson, Marshall W, MD   REQUESTING/REFERRING PHYSICIAN:  Issac  CHIEF COMPLAINT:   Chief Complaint  Patient presents with  . Back Pain    HISTORY OF PRESENT ILLNESS: Betty Betty  is a 68 y.o. female with a known history of anemia, arthritis, CHF, depression, diabetes, gastroesophageal reflux disease, hypertension-has chronic lower back pain and gets injections at her clinic. She came to emergency room yesterday with significant lower back pain but she was sent home after pain medication. She continued to have significant pain which prevents her from walking moderate distances and she started getting short of breath when she try to do that.  Her blood pressure also goes up because of significant pain.  Today with similar episode her blood pressure was uncontrolled in ER and she was feeling short of breath.  Her chest x-ray was some infiltrate and COVID test is pending.  She denies any cough or fever.  She is given to hospitalist service mainly for uncontrolled hypertension and shortness of breath associated with lower back pain.  PAST MEDICAL HISTORY:   Past Medical History:  Diagnosis Date  . Anemia   . Arthritis   . CHF (congestive heart failure) (HCC)   . Depression   . Diabetes mellitus without complication (HCC)   . GERD (gastroesophageal reflux disease)   . Gout   . Hypertension     PAST SURGICAL HISTORY:  Past Surgical History:  Procedure Laterality Date  . ABDOMINAL HYSTERECTOMY    . CATARACT EXTRACTION W/PHACO Right 04/16/2017   Procedure: CATARACT EXTRACTION PHACO AND INTRAOCULAR LENS PLACEMENT (IOC);  Surgeon: Galen ManilaPorfilio, William, MD;  Location: [facility] ORS;  Service: Ophthalmology;  Laterality: Right;  US 01:06 AP% 16.5 CDE 11.03 Fluid pack lot # [id number]  . CATARACT  EXTRACTION W/PHACO Left 10/01/2017   Procedure: CATARACT EXTRACTION PHACO AND INTRAOCULAR LENS PLACEMENT (IOC)-LEFT DIABETIC;  Surgeon: Galen ManilaPorfilio, William, MD;  Location: [facility] ORS;  Service: Ophthalmology;  Laterality: Left;  US 01:05.7 AP% 12.2 CDE 7.42 Fluid pack lot # [id number]  . CTR Left   . TONSILLECTOMY      SOCIAL HISTORY:  Social History   Tobacco Use  . Smoking status: Former Games developermoker  . Smokeless tobacco: Current User    Types: Snuff  Substance Use Topics  . Alcohol use: Yes    Comment: occasonally    FAMILY HISTORY:  Family History  Problem Relation Age of Onset  . Alcohol abuse Brother     DRUG ALLERGIES:  Allergies  Allergen Reactions  . Omeprazole Other (See Comments)    CHEST PAIN   . Atorvastatin Other (See Comments)    And another statin too  . Codeine Other (See Comments) and Anxiety    Reaction: jittery  Jittery    REVIEW OF SYSTEMS:   CONSTITUTIONAL: No fever, fatigue or weakness.  EYES: No blurred or double vision.  EARS, NOSE, AND THROAT: No tinnitus or ear pain.  RESPIRATORY: No cough, have shortness of breath, wheezing or hemoptysis.  CARDIOVASCULAR: No chest pain, orthopnea, edema.  GASTROINTESTINAL: No nausea, vomiting, diarrhea or abdominal pain.  GENITOURINARY: No dysuria, hematuria.  ENDOCRINE: No polyuria, nocturia,  HEMATOLOGY: No anemia, easy bruising or bleeding SKIN: No rash or lesion. MUSCULOSKELETAL: No joint pain or arthritis.   NEUROLOGIC: No tingling, numbness, weakness.  PSYCHIATRY:  No anxiety or depression.   MEDICATIONS AT HOME:  Prior to Admission medications   Medication Sig Start Date End Date Taking? Authorizing Provider  aspirin EC 81 MG tablet Take 81 mg by mouth daily.   Yes [provider]  carvedilol (COREG) 25 MG tablet Take 25 mg by mouth 2 (two) times daily.  02/14/15  Yes [provider]  escitalopram (LEXAPRO) 20 MG tablet Take 20 mg by mouth daily. 04/09/19 07/08/19 Yes [provider]  glimepiride (AMARYL) 2 MG tablet Take 2 mg by mouth daily. With breakfast 10/28/18 10/28/19 Yes [provider]  losartan (COZAAR) 50 MG tablet Take 50 mg by mouth daily.  01/23/15  Yes [provider]  metFORMIN (GLUCOPHAGE-XR) 500 MG 24 hr tablet Take 1,000 mg by mouth 2 (two) times daily.    Yes [provider]  pantoprazole (PROTONIX) 40 MG tablet Take 40 mg by mouth daily. 03/07/17  Yes [provider]  spironolactone (ALDACTONE) 25 MG tablet Take 25 mg by mouth daily.   Yes [provider]  torsemide (DEMADEX) 10 MG tablet Take 10 mg by mouth daily.  03/23/15  Yes [provider]  colchicine 0.6 MG tablet Take 0.6 mg by mouth daily.    [provider]  diazepam (VALIUM) 2 MG tablet Take 1 tablet (2 mg total) by mouth every 6 (six) hours as needed for anxiety. 12/09/17   Loney Hering, MD  famotidine (PEPCID) 40 MG tablet Take 1 tablet (40 mg total) by mouth every evening. 12/09/17 12/09/18  Loney Hering, MD  JANUVIA 100 MG tablet Take 100 mg by mouth daily. 03/04/17   [provider]  methocarbamol (ROBAXIN) 500 MG tablet Take 1 tablet (500 mg total) by mouth every 6 (six) hours as needed. Patient not taking: Reported on 06/22/2019 09/21/18   Johnn Hai, PA-C  naproxen (NAPROSYN) 500 MG tablet Take 1 tablet (500 mg total) by mouth 2 (two) times daily with a meal. Patient not taking: Reported on 06/22/2019 09/21/18   Johnn Hai, PA-C  oxyCODONE-acetaminophen (PERCOCET) 7.5-325 MG tablet Take 1 tablet by mouth every 4 (four) hours as needed for severe pain. 06/21/19 06/20/20  Fisher, Linden Dolin, PA-C  phenazopyridine (PYRIDIUM) 200 MG tablet Take 1 tablet (200 mg total) by mouth 3 (three) times daily as needed for pain. Patient not taking: Reported on 06/22/2019 12/28/17   Paulette Blanch, MD  traMADol (ULTRAM) 50 MG tablet Take 1 tablet (50 mg total) by mouth every 6 (six) hours as needed. Patient not taking:  Reported on 06/22/2019 08/17/18   Harvest Dark, MD      PHYSICAL EXAMINATION:   VITAL SIGNS: Blood pressure (!) 192/95, pulse 82, temperature 98.2 F (36.8 C), temperature source Oral, resp. rate 13, height 5\' 2"  (1.575 m), weight 106.6 kg, SpO2 99 %.  GENERAL:  68 y.o.-year-old morbidly obese patient lying in the bed with no acute distress.  EYES: Pupils equal, round, reactive to light and accommodation. No scleral icterus. Extraocular muscles intact.  HEENT: Head atraumatic, normocephalic. Oropharynx and nasopharynx clear.  NECK:  Supple, no jugular venous distention. No thyroid enlargement, no tenderness.  LUNGS: Normal breath sounds bilaterally, no wheezing, rales,rhonchi or crepitation. No use of accessory muscles of respiration.  CARDIOVASCULAR: S1, S2 normal. No murmurs, rubs, or gallops.  ABDOMEN: Soft, nontender, nondistended. Bowel sounds present. No organomegaly or mass.  EXTREMITIES: No pedal edema, cyanosis, or clubbing.  NEUROLOGIC: Cranial nerves II through XII are intact.  Muscle strength 4/5 in all extremities. Sensation intact. Gait not checked.  PSYCHIATRIC: The patient is alert and oriented x 3.  SKIN: No obvious rash, lesion, or ulcer.   LABORATORY PANEL:   CBC Recent Labs  Lab 06/22/19 1222  WBC 9.2  HGB 12.3  HCT 39.2  PLT 277  MCV 75.1*  MCH 23.6*  MCHC 31.4  RDW 16.8*  LYMPHSABS 2.4  MONOABS 1.1*  EOSABS 0.1  BASOSABS 0.0   ------------------------------------------------------------------------------------------------------------------  Chemistries  Recent Labs  Lab 06/22/19 1222  NA 140  K 3.7  CL 101  CO2 30  GLUCOSE 115*  BUN 20  CREATININE 0.83  CALCIUM 9.6  AST 15  ALT 16  ALKPHOS 77  BILITOT 0.7   ------------------------------------------------------------------------------------------------------------------ estimated creatinine clearance is 75.5 mL/min (by C-G formula based on SCr of 0.83  mg/dL). ------------------------------------------------------------------------------------------------------------------ No results for input(s): TSH, T4TOTAL, T3FREE, THYROIDAB in the last 72 hours.  Invalid input(s): FREET3   Coagulation profile No results for input(s): INR, PROTIME in the last 168 hours. ------------------------------------------------------------------------------------------------------------------- No results for input(s): DDIMER in the last 72 hours. -------------------------------------------------------------------------------------------------------------------  Cardiac Enzymes No results for input(s): CKMB, TROPONINI, MYOGLOBIN in the last 168 hours.  Invalid input(s): CK ------------------------------------------------------------------------------------------------------------------ Invalid input(s): POCBNP  ---------------------------------------------------------------------------------------------------------------  Urinalysis    Component Value Date/Time   COLORURINE YELLOW (A) 06/22/2019 1222   APPEARANCEUR HAZY (A) 06/22/2019 1222   APPEARANCEUR Hazy 09/18/2013 0828   LABSPEC 1.015 06/22/2019 1222   LABSPEC 1.013 09/18/2013 0828   PHURINE 6.0 06/22/2019 1222   GLUCOSEU NEGATIVE 06/22/2019 1222   GLUCOSEU Negative 09/18/2013 0828   HGBUR NEGATIVE 06/22/2019 1222   BILIRUBINUR NEGATIVE 06/22/2019 1222   BILIRUBINUR Negative 09/18/2013 0828   KETONESUR NEGATIVE 06/22/2019 1222   PROTEINUR NEGATIVE 06/22/2019 1222   NITRITE NEGATIVE 06/22/2019 1222   LEUKOCYTESUR SMALL (A) 06/22/2019 1222   LEUKOCYTESUR 1+ 09/18/2013 0828     RADIOLOGY: Dg Chest 2 View  Result Date: 06/22/2019 CLINICAL DATA:  Chronic dry cough. Pt states she had an episode of SOB 1 wk ago and again today. Pt here for continued low back/right flank pain. Hx - CHF, DM, HTN, former smoker, currently uses snuff. EXAM: CHEST - 2 VIEW COMPARISON:  09/21/2018, [id number] FINDINGS:  Heart size is normal. There is prominence of interstitial markings throughout the lungs, increased compared to prior study. More focal opacity in the LEFT lung base may represent atelectasis or infiltrate. Mild perihilar peribronchial thickening. IMPRESSION: Increased prominence of interstitial markings, consistent with viral or reactive airways disease. LEFT LOWER lobe atelectasis or infiltrate. Electronically Signed   By: Norva PavlovElizabeth  Brown M.D.   On: 06/22/2019 14:31   Ct Abdomen Pelvis W Contrast  Result Date: 06/22/2019 CLINICAL DATA:  Acute generalized abdominal pain. EXAM: CT ABDOMEN AND PELVIS WITH CONTRAST TECHNIQUE: Multidetector CT imaging of the abdomen and pelvis was performed using the standard protocol following bolus administration of intravenous contrast. CONTRAST:  100mL OMNIPAQUE IOHEXOL 300 MG/ML  SOLN COMPARISON:  CT scan of December 28, 2017. FINDINGS: Lower chest: No acute abnormality. Hepatobiliary: No focal liver abnormality is seen. No gallstones, gallbladder wall thickening, or biliary dilatation. Pancreas: Unremarkable. No pancreatic ductal dilatation or surrounding inflammatory changes. Spleen: Normal in size without focal abnormality. Adrenals/Urinary Tract: Adrenal glands appear normal. Stable nonobstructive calculus seen in lower pole of left kidney. No hydronephrosis or renal obstruction is noted. Urinary bladder is unremarkable. Stomach/Bowel: Stomach is within normal limits. Appendix appears normal. No evidence of bowel wall thickening, distention, or inflammatory changes. Sigmoid  diverticulosis is noted without inflammation. Vascular/Lymphatic: Aortic atherosclerosis. No enlarged abdominal or pelvic lymph nodes. Reproductive: Status post hysterectomy. No adnexal masses. Other: No abdominal wall hernia or abnormality. No abdominopelvic ascites. Musculoskeletal: No acute or significant osseous findings. IMPRESSION: Stable nonobstructive left renal calculus. No hydronephrosis or renal  obstruction is noted. Sigmoid diverticulosis without inflammation. Aortic Atherosclerosis (ICD10-I70.0). Electronically Signed   By: Lupita Raider M.D.   On: 06/22/2019 13:14    EKG: Orders placed or performed during the hospital encounter of 06/22/19  . ED EKG  . ED EKG  . EKG 12-Lead  . EKG 12-Lead    IMPRESSION AND PLAN:  *Community-acquired pneumonia Chest x-ray is suggestive of some atelectasis versus infiltrate I will give Rocephin and azithromycin and send procalcitonin COVID-19 test is sent but result is awaited  *Congestive heart failure Patient is not aware about ejection fraction. No records of echocardiogram. Get echocardiogram  *Uncontrolled hypertension I will continue home medications and will give hydralazine as needed.  *Lower back pain Recent MRI shows arthritis, with some compressions, I advised her to follow with pain clinic.  *Diabetes We will continue oral home medications and keep on sliding scale coverage  All the records are reviewed and case discussed with ED provider. Management plans discussed with the patient, family and they are in agreement.  CODE STATUS: Full code Code Status History    Date Active Date Inactive Code Status Order ID Comments User Context   06/26/2017 1245 06/27/2017 1552 Full Code [id number]  Milagros Loll, MD ED   Advance Care Planning Activity     Patient husband was present in the room during my visit  TOTAL TIME TAKING CARE OF THIS PATIENT: 45 minutes.    Altamese Dilling M.D on 06/22/2019   Between 7am to 6pm - Pager - [phone]  After 6pm go to [email or web address] - Social research officer, government  [facility]  Office  [phone]  CC: Primary care physician; Lauro Regulus, MD   Note: This dictation was prepared with Dragon dictation along with smaller phrase technology. Any transcriptional errors that result from this process are unintentional.

## 2019-06-22 NOTE — ED Notes (Signed)
ED TO INPATIENT HANDOFF REPORT  ED Nurse Name and Phone #:  jen  S Name/Age/Gender Betty Luna 68 y.o. female Room/Bed: ED03A/ED03A  Code Status   Code Status: Prior  Home/SNF/Other Home   Is this baseline? Yes   Triage Complete: Triage complete  Chief Complaint back pain  Triage Note Patient reports chronic lower back pain. States she had steroid injection 1 week ago which helped for about a day. Since then, pain has been getting progressively worse. Seen for same last night.   Allergies Allergies  Allergen Reactions  . Omeprazole Other (See Comments)    CHEST PAIN   . Atorvastatin Other (See Comments)    And another statin too  . Codeine Other (See Comments) and Anxiety    Reaction: jittery  Jittery    Level of Care/Admitting Diagnosis ED Disposition    ED Disposition Condition [facility]: [place] [[id number]]  Level of Care: Med-Surg [16]  Covid Evaluation: Asymptomatic Screening Protocol (No Symptoms)  Diagnosis: SOB (shortness of breath) [017510]  Admitting Physician: Vaughan Basta [phone]  Attending Physician: Vaughan Basta [phone]  PT Class (Do Not Modify): Observation [104]  PT Acc Code (Do Not Modify): Observation [10022]       B Medical/Surgery History Past Medical History:  Diagnosis Date  . Anemia   . Arthritis   . CHF (congestive heart failure) ([place])   . Depression   . Diabetes mellitus without complication ([place])   . GERD (gastroesophageal reflux disease)   . Gout   . Hypertension    Past Surgical History:  Procedure Laterality Date  . ABDOMINAL HYSTERECTOMY    . CATARACT EXTRACTION W/PHACO Right 04/16/2017   Procedure: CATARACT EXTRACTION PHACO AND INTRAOCULAR LENS PLACEMENT (IOC);  Surgeon: Birder Robson, MD;  Location: [facility] ORS;  Service: Ophthalmology;  Laterality: Right;  [place] 01:06 AP% 16.5 CDE 11.03 Fluid pack lot # [id number]  . CATARACT EXTRACTION W/PHACO  Left 10/01/2017   Procedure: CATARACT EXTRACTION PHACO AND INTRAOCULAR LENS PLACEMENT (IOC)-LEFT DIABETIC;  Surgeon: Birder Robson, MD;  Location: [facility] ORS;  Service: Ophthalmology;  Laterality: Left;  [place] 01:05.7 AP% 12.2 CDE 7.42 Fluid pack lot # [id number]  . CTR Left   . TONSILLECTOMY       A IV Location/Drains/Wounds Patient Lines/Drains/Airways Status   Active Line/Drains/Airways    Name:   Placement date:   Placement time:   Site:   Days:   Peripheral IV 06/22/19 Left Antecubital   06/22/19    1221    Antecubital   less than 1   Airway   04/16/17    1139     797   Incision (Closed) 04/16/17 Eye Right   04/16/17    1139     797   Incision (Closed) 10/01/17 Eye Left   10/01/17    0647     629          Intake/Output Last 24 hours No intake or output data in the 24 hours ending 06/22/19 2255  Labs/Imaging Results for orders placed or performed during the hospital encounter of 06/22/19 (from the past 48 hour(s))  CBC with Differential     Status: Abnormal   Collection Time: 06/22/19 12:22 PM  Result Value Ref Range   WBC 9.2 4.0 - 10.5 K/uL   RBC 5.22 (H) 3.87 - 5.11 MIL/uL   Hemoglobin 12.3 12.0 - 15.0 g/dL   HCT 39.2 36.0 - 46.0 %   MCV 75.1 (L) 80.0 -  100.0 fL   MCH 23.6 (L) 26.0 - 34.0 pg   MCHC 31.4 30.0 - 36.0 g/dL   RDW 82.916.8 (H) 56.211.5 - 13.015.5 %   Platelets 277 150 - 400 K/uL   nRBC 0.0 0.0 - 0.2 %   Neutrophils Relative % 61 %   Neutro Abs 5.5 1.7 - 7.7 K/uL   Lymphocytes Relative 26 %   Lymphs Abs 2.4 0.7 - 4.0 K/uL   Monocytes Relative 12 %   Monocytes Absolute 1.1 (H) 0.1 - 1.0 K/uL   Eosinophils Relative 1 %   Eosinophils Absolute 0.1 0.0 - 0.5 K/uL   Basophils Relative 0 %   Basophils Absolute 0.0 0.0 - 0.1 K/uL   Immature Granulocytes 0 %   Abs Immature Granulocytes 0.03 0.00 - 0.07 K/uL    Comment: Performed at [facility], [address] [place], [place] [address]  Urinalysis, Complete w Microscopic     Status: Abnormal   Collection  Time: 06/22/19 12:22 PM  Result Value Ref Range   Color, Urine YELLOW (A) YELLOW   APPearance HAZY (A) CLEAR   Specific Gravity, Urine 1.015 1.005 - 1.030   pH 6.0 5.0 - 8.0   Glucose, UA NEGATIVE NEGATIVE mg/dL   Hgb urine dipstick NEGATIVE NEGATIVE   Bilirubin Urine NEGATIVE NEGATIVE   Ketones, ur NEGATIVE NEGATIVE mg/dL   Protein, ur NEGATIVE NEGATIVE mg/dL   Nitrite NEGATIVE NEGATIVE   Leukocytes,Ua SMALL (A) NEGATIVE   RBC / HPF 0-5 0 - 5 RBC/hpf   WBC, UA 6-10 0 - 5 WBC/hpf   Bacteria, UA RARE (A) NONE SEEN   Squamous Epithelial / LPF 0-5 0 - 5   Mucus PRESENT    Non Squamous Epithelial PRESENT (A) NONE SEEN    Comment: Performed at [facility], [address] [place], [place] [address]  Comprehensive metabolic panel     Status: Abnormal   Collection Time: 06/22/19 12:22 PM  Result Value Ref Range   Sodium 140 135 - 145 mmol/L   Potassium 3.7 3.5 - 5.1 mmol/L   Chloride 101 98 - 111 mmol/L   CO2 30 22 - 32 mmol/L   Glucose, Bld 115 (H) 70 - 99 mg/dL   BUN 20 8 - 23 mg/dL   Creatinine, Ser 9.520.83 0.44 - 1.00 mg/dL   Calcium 9.6 8.9 - 84.110.3 mg/dL   Total Protein 7.3 6.5 - 8.1 g/dL   Albumin 3.6 3.5 - 5.0 g/dL   AST 15 15 - 41 U/L   ALT 16 0 - 44 U/L   Alkaline Phosphatase 77 38 - 126 U/L   Total Bilirubin 0.7 0.3 - 1.2 mg/dL   GFR calc non Af Amer >60 >60 mL/min   GFR calc Af Amer >60 >60 mL/min   Anion gap 9 5 - 15    Comment: Performed at [facility], [address] [place], [place] [address]  Brain natriuretic peptide     Status: Abnormal   Collection Time: 06/22/19 12:22 PM  Result Value Ref Range   B Natriuretic Peptide 150.0 (H) 0.0 - 100.0 pg/mL    Comment: Performed at [facility], [address] [place], [place] [address]  Troponin I (High Sensitivity)     Status: None   Collection Time: 06/22/19 12:22 PM  Result Value Ref Range   Troponin I (High Sensitivity) 17 <18 ng/L    Comment: (NOTE) Elevated high sensitivity  troponin I (hsTnI) values and significant  changes across  serial measurements may suggest ACS but many other  chronic and acute conditions are known to elevate hsTnI results.  Refer to the "Links" section for chest pain algorithms and additional  guidance. Performed at [facility], [address] [place], [place] [address]   SARS Coronavirus 2 [facility](Hospital order, Performed in [facility]) Nasopharyngeal Nasopharyngeal Swab     Status: None   Collection Time: 06/22/19  4:40 PM   Specimen: Nasopharyngeal Swab  Result Value Ref Range   SARS Coronavirus 2 NEGATIVE NEGATIVE    Comment: (NOTE) If result is NEGATIVE SARS-CoV-2 target nucleic acids are NOT DETECTED. The SARS-CoV-2 RNA is generally detectable in upper and lower  respiratory specimens during the acute phase of infection. The lowest  concentration of SARS-CoV-2 viral copies this assay can detect is 250  copies / mL. A negative result does not preclude SARS-CoV-2 infection  and should not be used as the sole basis for treatment or other  patient management decisions.  A negative result may occur with  improper specimen collection / handling, submission of specimen other  than nasopharyngeal swab, presence of viral mutation(s) within the  areas targeted by this assay, and inadequate number of viral copies  (<250 copies / mL). A negative result must be combined with clinical  observations, patient history, and epidemiological information. If result is POSITIVE SARS-CoV-2 target nucleic acids are DETECTED. The SARS-CoV-2 RNA is generally detectable in upper and lower  respiratory specimens dur ing the acute phase of infection.  Positive  results are indicative of active infection with SARS-CoV-2.  Clinical  correlation with patient history and other diagnostic information is  necessary to determine patient infection status.  Positive results do  not rule out bacterial infection or co-infection with other  viruses. If result is PRESUMPTIVE POSTIVE SARS-CoV-2 nucleic acids MAY BE PRESENT.   A presumptive positive result was obtained on the submitted specimen  and confirmed on repeat testing.  While 2019 novel coronavirus  (SARS-CoV-2) nucleic acids may be present in the submitted sample  additional confirmatory testing may be necessary for epidemiological  and / or clinical management purposes  to differentiate between  SARS-CoV-2 and other Sarbecovirus currently known to infect humans.  If clinically indicated additional testing with an alternate test  methodology [phone]([id number]) is advised. The SARS-CoV-2 RNA is generally  detectable in upper and lower respiratory sp ecimens during the acute  phase of infection. The expected result is Negative. Fact Sheet for Patients:  BoilerBrush.com.cyhttps://www.fda.gov/media/136312/download Fact Sheet for Healthcare Providers: https://pope.com/https://www.fda.gov/media/136313/download This test is not yet approved or cleared by the [place] FDA and has been authorized for detection and/or diagnosis of SARS-CoV-2 by FDA under an Emergency Use Authorization (EUA).  This EUA will remain in effect (meaning this test can be used) for the duration of the COVID-19 declaration under Section 564(b)(1) of the Act, 21 U.S.C. section 360bbb-3(b)(1), unless the authorization is terminated or revoked sooner. Performed at [facility], [address] [place], [place] [address]   Troponin I (High Sensitivity)     Status: None   Collection Time: 06/22/19  4:40 PM  Result Value Ref Range   Troponin I (High Sensitivity) 16 <18 ng/L    Comment: (NOTE) Elevated high sensitivity troponin I (hsTnI) values and significant  changes across serial measurements may suggest ACS but many other  chronic and acute conditions are known to elevate hsTnI results.  Refer to the "Links" section for chest pain algorithms and additional  guidance. Performed  at [facility], [address] [place], [place] [address]   Glucose, capillary     Status: Abnormal   Collection Time: 06/22/19  8:49 PM  Result Value Ref Range   Glucose-Capillary 311 (H) 70 - 99 mg/dL   Dg Chest 2 View  Result Date: 06/22/2019 CLINICAL DATA:  Chronic dry cough. Pt states she had an episode of SOB 1 wk ago and again today. Pt here for continued low back/right flank pain. Hx - CHF, DM, HTN, former smoker, currently uses snuff. EXAM: CHEST - 2 VIEW COMPARISON:  09/21/2018, 13244 FINDINGS: Heart size is normal. There is prominence of interstitial markings throughout the lungs, increased compared to prior study. More focal opacity in the LEFT lung base may represent atelectasis or infiltrate. Mild perihilar peribronchial thickening. IMPRESSION: Increased prominence of interstitial markings, consistent with viral or reactive airways disease. LEFT LOWER lobe atelectasis or infiltrate. Electronically Signed   By: Norva Pavlov M.D.   On: 06/22/2019 14:31   Ct Abdomen Pelvis W Contrast  Result Date: 06/22/2019 CLINICAL DATA:  Acute generalized abdominal pain. EXAM: CT ABDOMEN AND PELVIS WITH CONTRAST TECHNIQUE: Multidetector CT imaging of the abdomen and pelvis was performed using the standard protocol following bolus administration of intravenous contrast. CONTRAST:  OMNIPAQUE IOHEXOL 300 MG/ML  SOLN COMPARISON:  CT scan of December 28, 2017. FINDINGS: Lower chest: No acute abnormality. Hepatobiliary: No focal liver abnormality is seen. No gallstones, gallbladder wall thickening, or biliary dilatation. Pancreas: Unremarkable. No pancreatic ductal dilatation or surrounding inflammatory changes. Spleen: Normal in size without focal abnormality. Adrenals/Urinary Tract: Adrenal glands appear normal. Stable nonobstructive calculus seen in lower pole of left kidney. No hydronephrosis or renal obstruction is noted. Urinary bladder is unremarkable. Stomach/Bowel: Stomach is within normal limits. Appendix appears normal. No  evidence of bowel wall thickening, distention, or inflammatory changes. Sigmoid diverticulosis is noted without inflammation. Vascular/Lymphatic: Aortic atherosclerosis. No enlarged abdominal or pelvic lymph nodes. Reproductive: Status post hysterectomy. No adnexal masses. Other: No abdominal wall hernia or abnormality. No abdominopelvic ascites. Musculoskeletal: No acute or significant osseous findings. IMPRESSION: Stable nonobstructive left renal calculus. No hydronephrosis or renal obstruction is noted. Sigmoid diverticulosis without inflammation. Aortic Atherosclerosis (ICD10-I70.0). Electronically Signed   By: Lupita Raider M.D.   On: 06/22/2019 13:14    Pending Labs Unresulted Labs (From admission, onward)    Start     Ordered   06/23/19 0500  Procalcitonin  Daily,   STAT     06/22/19 1641   06/22/19 1647  Hemoglobin A1c  Once,   STAT    Comments: To assess prior glycemic control    06/22/19 1646   06/22/19 1642  Procalcitonin - Baseline  ONCE - STAT,   STAT     06/22/19 1641   Signed and Held  HIV antibody (Routine Testing)  Once,   R     Signed and Held   Signed and Held  Basic metabolic panel  Tomorrow morning,   R     Signed and Held   Signed and Held  CBC  Tomorrow morning,   R     Signed and Held   Signed and Held  CBC  (heparin)  Once,   R    Comments: Baseline for heparin therapy IF NOT ALREADY DRAWN.  Notify MD if PLT < 100 K.    Signed and Held   Signed and Held  Creatinine, serum  (heparin)  Once,   R    Comments: Baseline  for heparin therapy IF NOT ALREADY DRAWN.    Signed and Held          Vitals/Pain Today's Vitals   06/22/19 2100 06/22/19 2130 06/22/19 2200 06/22/19 2230  BP: (!) 154/79 (!) 158/72 (!) 152/66 (!) 171/89  Pulse: 87 87 86   Resp: 15 19 14 15   Temp:      TempSrc:      SpO2: 95% 93% (!) 89%   Weight:      Height:      PainSc:        Isolation Precautions No active isolations  Medications Medications  hydrALAZINE (APRESOLINE)  injection 10 mg (has no administration in time range)  oxyCODONE-acetaminophen (PERCOCET/ROXICET) 5-325 MG per tablet 1 tablet (has no administration in time range)  cyclobenzaprine (FLEXERIL) tablet 5 mg (has no administration in time range)  cefTRIAXone (ROCEPHIN) 2 g in sodium chloride 0.9 % 100 mL IVPB (0 g Intravenous Stopped 06/22/19 2016)  azithromycin (ZITHROMAX) 500 mg in sodium chloride 0.9 % 250 mL IVPB (0 mg Intravenous Stopped 06/22/19 2057)  insulin aspart (novoLOG) injection 0-9 Units (7 Units Subcutaneous Given 06/22/19 2056)  insulin aspart (novoLOG) injection 0-5 Units (has no administration in time range)  dexamethasone (DECADRON) injection 10 mg (10 mg Intravenous Given 06/22/19 1229)  ondansetron (ZOFRAN) injection 4 mg (4 mg Intravenous Given 06/22/19 1232)  morphine 4 MG/ML injection 6 mg (6 mg Intravenous Given 06/22/19 1355)  iohexol (OMNIPAQUE) 300 MG/ML solution 100 mL (100 mLs Intravenous Contrast Given 06/22/19 1255)  furosemide (LASIX) injection 40 mg (40 mg Intravenous Given 06/22/19 1635)    Mobility walks Low fall risk   Focused Assessments Pulmonary Assessment Handoff:  Lung sounds:   O2 Device: Room Air        R Recommendations: See Admitting Provider Note  Report given to:   Additional Notes:

## 2019-06-22 NOTE — ED Notes (Signed)
Pt reports received a steroid injection in her back a couple of weeks ago but it wore off. Pt states she came to the ED last pm and we gave her meds but she is still in pain.   Pt reports hx of back pain and states that this pain is no different than her pain usually is other than it will not go away.

## 2019-06-23 ENCOUNTER — Observation Stay
Admit: 2019-06-23 | Discharge: 2019-06-23 | Disposition: A | Discharge status: Home / Self Care | Payer: Medicare Other | Attending: Internal Medicine | Admitting: Internal Medicine

## 2019-06-23 LAB — PROCALCITONIN
Procalcitonin: <0.1 ng/mL
Procalcitonin: <0.1 ng/mL

## 2019-06-23 LAB — HEMOGLOBIN A1C
Hgb A1c MFr Bld: 7.3 % — ABNORMAL HIGH (ref 4.8–5.6)
Mean Plasma Glucose: 162.81 mg/dL

## 2019-06-23 LAB — BASIC METABOLIC PANEL
Anion gap: 10 (ref 5–15)
BUN: 24 mg/dL — ABNORMAL HIGH (ref 8–23)
CO2: 29 mmol/L (ref 22–32)
Calcium: 9.8 mg/dL (ref 8.9–10.3)
Chloride: 100 mmol/L (ref 98–111)
Creatinine, Ser: 0.8 mg/dL (ref 0.44–1.00)
GFR calc Af Amer: >60 mL/min (ref >60)
GFR calc non Af Amer: >60 mL/min (ref >60)
Glucose, Bld: 244 mg/dL — ABNORMAL HIGH (ref 70–99)
Potassium: 4.3 mmol/L (ref 3.5–5.1)
Sodium: 139 mmol/L (ref 135–145)

## 2019-06-23 LAB — CBC
HCT: 39.5 % (ref 36.0–46.0)
Hemoglobin: 12.4 g/dL (ref 12.0–15.0)
MCH: 23.8 pg — ABNORMAL LOW (ref 26.0–34.0)
MCHC: 31.4 g/dL (ref 30.0–36.0)
MCV: 76 fL — ABNORMAL LOW (ref 80.0–100.0)
Platelets: 318 10*3/uL (ref 150–400)
RBC: 5.2 MIL/uL — ABNORMAL HIGH (ref 3.87–5.11)
RDW: 16.4 % — ABNORMAL HIGH (ref 11.5–15.5)
WBC: 9.6 10*3/uL (ref 4.0–10.5)
nRBC: 0 % (ref 0.0–0.2)

## 2019-06-23 LAB — ECHOCARDIOGRAM COMPLETE
Height: 62 in
Weight: 3760 oz

## 2019-06-23 LAB — GLUCOSE, CAPILLARY
Glucose-Capillary: 186 mg/dL — ABNORMAL HIGH (ref 70–99)
Glucose-Capillary: 132 mg/dL — ABNORMAL HIGH (ref 70–99)

## 2019-06-23 MED ORDER — AZITHROMYCIN 500 MG PO TABS: 500.0000 mg | ORAL_TABLET | Freq: Every day | ORAL | Status: DC

## 2019-06-23 MED ORDER — AZITHROMYCIN 500 MG PO TABS: 500.0000 mg | ORAL_TABLET | Freq: Every day | ORAL | 0 refills | Status: DC

## 2019-06-23 NOTE — Progress Notes (Signed)
PHARMACIST - PHYSICIAN COMMUNICATION DR:   Stark Jock  CONCERNING: Antibiotic IV to Oral Route Change Policy  RECOMMENDATION: This patient is receiving Azithromycin by the intravenous route.  Based on criteria approved by the Pharmacy and Therapeutics Committee, the antibiotic(s) is/are being converted to the equivalent oral dose form(s).   DESCRIPTION: These criteria include:  Patient being treated for a respiratory tract infection, urinary tract infection, cellulitis or clostridium difficile associated diarrhea if on metronidazole  The patient is not neutropenic and does not exhibit a GI malabsorption state  The patient is eating (either orally or via tube) and/or has been taking other orally administered medications for a least 24 hours  The patient is improving clinically and has a Tmax < 100.5  If you have questions about this conversion, please contact the [place], PharmD, BCPS Clinical Pharmacist 06/23/2019 8:45 AM

## 2019-06-23 NOTE — Progress Notes (Signed)
Discharge instructions given and went over with patient at bedside. All questions answered. Patient discharged home. Diago Haik S, RN  

## 2019-06-23 NOTE — Discharge Summary (Signed)
[employer] [facility] - [place] [facility]   PATIENT NAME: Betty Luna    MR#:  [id number]  DATE OF BIRTH:  June 21, 1951  DATE OF ADMISSION:  06/22/2019   ADMITTING PHYSICIAN: Altamese Dilling, MD  DATE OF DISCHARGE: 06/23/2019  PRIMARY CARE PHYSICIAN: Lauro Regulus, MD   ADMISSION DIAGNOSIS:  Lumbar radiculopathy [M54.16] Hypoxia [R09.02] Acute respiratory failure with hypoxia (HCC) [J96.01] DISCHARGE DIAGNOSIS:  Principal Problem:   Shortness of breath Active Problems:   Uncontrolled hypertension   SOB (shortness of breath)  SECONDARY DIAGNOSIS:   Past Medical History:  Diagnosis Date   Anemia    Arthritis    CHF (congestive heart failure) (HCC)    Depression    Diabetes mellitus without complication (HCC)    GERD (gastroesophageal reflux disease)    Gout    Hypertension    HOSPITAL COURSE:  Chief Complaint; Back pain  HPI:  Sahniya Henneman  is a 68 y.o. female with a known history of anemia, arthritis, CHF, depression, diabetes, gastroesophageal reflux disease, hypertension-has chronic lower back pain and gets injections at her clinic.She came to emergency room 1 day prior to admission with significant lower back pain but she was sent home after pain medication.She continued to have significant pain which prevents her from walking moderate distances and she started getting short of breath when she try to do that.  Her blood pressure also goes up because of significant pain.   Patient's blood pressure was uncontrolled in ER and she was feeling short of breath.  Patient was diagnosed with community-acquired pneumonia and admitted to medical service for further evaluation.  Hospital course: *Community-acquired pneumonia Chest x-ray is suggestive of some atelectasis versus infiltrate.  Clinically however patient denies having any symptoms.  No fevers.  No cough productive of sputum.  Patient was being treated empirically with  azithromycin and Rocephin.  Symptoms more likely due to acute bronchitis.  Could be test negative.  Clinically and hemodynamically stable and wishes to be discharged home.  Discharge on p.o. azithromycin for a few days.  *Congestive heart failure, chronic diastolic Patient is not aware about ejection fraction.  2D echocardiogram done with ejection fraction of 50 to 55%.  Patient remains asymptomatic  *Uncontrolled hypertension Blood pressure controlled this morning.  Most likely due to pain.  *Lower back pain Recent MRI shows arthritis, with some compressions, patient remains asymptomatic with no pains this morning.  To follow-up with pain clinic as previously scheduled  *Diabetes Resumed home regimen.  Outpatient monitoring by primary care physician  DISCHARGE CONDITIONS:  Stable CONSULTS OBTAINED:   DRUG ALLERGIES:   Allergies  Allergen Reactions   Omeprazole Other (See Comments)    CHEST PAIN    Atorvastatin Other (See Comments)    And another statin too   Codeine Other (See Comments) and Anxiety    Reaction: jittery  Jittery   DISCHARGE MEDICATIONS:   Allergies as of 06/23/2019      Reactions   Omeprazole Other (See Comments)   CHEST PAIN    Atorvastatin Other (See Comments)   And another statin too   Codeine Other (See Comments), Anxiety   Reaction: jittery  Jittery      Medication List    STOP taking these medications   naproxen 500 MG tablet Commonly known as: Naprosyn   traMADol 50 MG tablet Commonly known as: Ultram     TAKE these medications   aspirin EC 81 MG tablet Take 81 mg by mouth daily.  azithromycin 500 MG tablet Commonly known as: ZITHROMAX Take 1 tablet (500 mg total) by mouth daily.   carvedilol 25 MG tablet Commonly known as: COREG Take 25 mg by mouth 2 (two) times daily.   colchicine 0.6 MG tablet Take 0.6 mg by mouth daily.   diazepam 2 MG tablet Commonly known as: Valium Take 1 tablet (2 mg total) by mouth every 6  (six) hours as needed for anxiety.   escitalopram 20 MG tablet Commonly known as: LEXAPRO Take 20 mg by mouth daily.   famotidine 40 MG tablet Commonly known as: PEPCID Take 1 tablet (40 mg total) by mouth every evening.   glimepiride 2 MG tablet Commonly known as: AMARYL Take 2 mg by mouth daily. With breakfast   Januvia 100 MG tablet Generic drug: sitaGLIPtin Take 100 mg by mouth daily.   losartan 50 MG tablet Commonly known as: COZAAR Take 50 mg by mouth daily.   metFORMIN 500 MG 24 hr tablet Commonly known as: GLUCOPHAGE-XR Take 1,000 mg by mouth 2 (two) times daily.   methocarbamol 500 MG tablet Commonly known as: Robaxin Take 1 tablet (500 mg total) by mouth every 6 (six) hours as needed.   oxyCODONE-acetaminophen 7.5-325 MG tablet Commonly known as: Percocet Take 1 tablet by mouth every 4 (four) hours as needed for severe pain.   pantoprazole 40 MG tablet Commonly known as: PROTONIX Take 40 mg by mouth daily.   phenazopyridine 200 MG tablet Commonly known as: Pyridium Take 1 tablet (200 mg total) by mouth 3 (three) times daily as needed for pain.   spironolactone 25 MG tablet Commonly known as: ALDACTONE Take 25 mg by mouth daily.   torsemide 10 MG tablet Commonly known as: DEMADEX Take 10 mg by mouth daily.        DISCHARGE INSTRUCTIONS:   DIET:  Cardiac diet and Diabetic diet DISCHARGE CONDITION:  Stable ACTIVITY:  Activity as tolerated OXYGEN:  Home Oxygen: No.  Oxygen Delivery: room air DISCHARGE LOCATION:  home   If you experience worsening of your admission symptoms, develop shortness of breath, life threatening emergency, suicidal or homicidal thoughts you must seek medical attention immediately by calling 911 or calling your MD immediately  if symptoms less severe.  You Must read complete instructions/literature along with all the possible adverse reactions/side effects for all the Medicines you take and that have been prescribed to  you. Take any new Medicines after you have completely understood and accpet all the possible adverse reactions/side effects.   Please note  You were cared for by a hospitalist during your hospital stay. If you have any questions about your discharge medications or the care you received while you were in the hospital after you are discharged, you can call the unit and asked to speak with the hospitalist on call if the hospitalist that took care of you is not available. Once you are discharged, your primary care physician will handle any further medical issues. Please note that NO REFILLS for any discharge medications will be authorized once you are discharged, as it is imperative that you return to your primary care physician (or establish a relationship with a primary care physician if you do not have one) for your aftercare needs so that they can reassess your need for medications and monitor your lab values.    On the day of Discharge:  VITAL SIGNS:  Blood pressure 119/62, pulse 89, temperature 98.4 F (36.9 C), temperature source Oral, resp. rate 14, height 5\' 2"  (  1.575 m), weight 106.6 kg, SpO2 100 %. PHYSICAL EXAMINATION:  GENERAL:  68 y.o.-year-old patient lying in the bed with no acute distress.  EYES: Pupils equal, round, reactive to light and accommodation. No scleral icterus. Extraocular muscles intact.  HEENT: Head atraumatic, normocephalic. Oropharynx and nasopharynx clear.  NECK:  Supple, no jugular venous distention. No thyroid enlargement, no tenderness.  LUNGS: Normal breath sounds bilaterally, no wheezing, rales,rhonchi or crepitation. No use of accessory muscles of respiration.  CARDIOVASCULAR: S1, S2 normal. No murmurs, rubs, or gallops.  ABDOMEN: Soft, non-tender, non-distended. Bowel sounds present. No organomegaly or mass.  EXTREMITIES: No pedal edema, cyanosis, or clubbing.  NEUROLOGIC: Cranial nerves II through XII are intact. Muscle strength 5/5 in all extremities.  Sensation intact. Gait not checked.  PSYCHIATRIC: The patient is alert and oriented x 3.  SKIN: No obvious rash, lesion, or ulcer.  DATA REVIEW:   CBC Recent Labs  Lab 06/23/19 0421  WBC 9.6  HGB 12.4  HCT 39.5  PLT 318    Chemistries  Recent Labs  Lab 06/22/19 1222 06/23/19 0421  NA 140 139  K 3.7 4.3  CL 101 100  CO2 30 29  GLUCOSE 115* 244*  BUN 20 24*  CREATININE 0.83 0.80  CALCIUM 9.6 9.8  AST 15  --   ALT 16  --   ALKPHOS 77  --   BILITOT 0.7  --      Microbiology Results  Results for orders placed or performed during the hospital encounter of 06/22/19  SARS Coronavirus 2 [facility](Hospital order, Performed in [facility] lab) Nasopharyngeal Nasopharyngeal Swab     Status: None   Collection Time: 06/22/19  4:40 PM   Specimen: Nasopharyngeal Swab  Result Value Ref Range Status   SARS Coronavirus 2 NEGATIVE NEGATIVE Final    Comment: (NOTE) If result is NEGATIVE SARS-CoV-2 target nucleic acids are NOT DETECTED. The SARS-CoV-2 RNA is generally detectable in upper and lower  respiratory specimens during the acute phase of infection. The lowest  concentration of SARS-CoV-2 viral copies this assay can detect is 250  copies / mL. A negative result does not preclude SARS-CoV-2 infection  and should not be used as the sole basis for treatment or other  patient management decisions.  A negative result may occur with  improper specimen collection / handling, submission of specimen other  than nasopharyngeal swab, presence of viral mutation(s) within the  areas targeted by this assay, and inadequate number of viral copies  (<250 copies / mL). A negative result must be combined with clinical  observations, patient history, and epidemiological information. If result is POSITIVE SARS-CoV-2 target nucleic acids are DETECTED. The SARS-CoV-2 RNA is generally detectable in upper and lower  respiratory specimens dur ing the acute phase of infection.  Positive  results  are indicative of active infection with SARS-CoV-2.  Clinical  correlation with patient history and other diagnostic information is  necessary to determine patient infection status.  Positive results do  not rule out bacterial infection or co-infection with other viruses. If result is PRESUMPTIVE POSTIVE SARS-CoV-2 nucleic acids MAY BE PRESENT.   A presumptive positive result was obtained on the submitted specimen  and confirmed on repeat testing.  While 2019 novel coronavirus  (SARS-CoV-2) nucleic acids may be present in the submitted sample  additional confirmatory testing may be necessary for epidemiological  and / or clinical management purposes  to differentiate between  SARS-CoV-2 and other Sarbecovirus currently known to infect humans.  If  clinically indicated additional testing with an alternate test  methodology [phone]) is advised. The SARS-CoV-2 RNA is generally  detectable in upper and lower respiratory sp ecimens during the acute  phase of infection. The expected result is Negative. Fact Sheet for Patients:  StrictlyIdeas.no Fact Sheet for Healthcare Providers: BankingDealers.co.za This test is not yet approved or cleared by the [place] FDA and has been authorized for detection and/or diagnosis of SARS-CoV-2 by FDA under an Emergency Use Authorization (EUA).  This EUA will remain in effect (meaning this test can be used) for the duration of the COVID-19 declaration under Section 564(b)(1) of the Act, 21 U.S.C. section 360bbb-3(b)(1), unless the authorization is terminated or revoked sooner. Performed at [facility], [address] [place], [place] [address]     RADIOLOGY:  Dg Chest 2 View  Result Date: 06/22/2019 CLINICAL DATA:  Chronic dry cough. Pt states she had an episode of SOB 1 wk ago and again today. Pt here for continued low back/right flank pain. Hx - CHF, DM, HTN, former smoker, currently uses  snuff. EXAM: CHEST - 2 VIEW COMPARISON:  09/21/2018, [id number] FINDINGS: Heart size is normal. There is prominence of interstitial markings throughout the lungs, increased compared to prior study. More focal opacity in the LEFT lung base may represent atelectasis or infiltrate. Mild perihilar peribronchial thickening. IMPRESSION: Increased prominence of interstitial markings, consistent with viral or reactive airways disease. LEFT LOWER lobe atelectasis or infiltrate. Electronically Signed   By: Nolon Nations M.D.   On: 06/22/2019 14:31   Ct Abdomen Pelvis W Contrast  Result Date: 06/22/2019 CLINICAL DATA:  Acute generalized abdominal pain. EXAM: CT ABDOMEN AND PELVIS WITH CONTRAST TECHNIQUE: Multidetector CT imaging of the abdomen and pelvis was performed using the standard protocol following bolus administration of intravenous contrast. CONTRAST:  175mL OMNIPAQUE IOHEXOL 300 MG/ML  SOLN COMPARISON:  CT scan of December 28, 2017. FINDINGS: Lower chest: No acute abnormality. Hepatobiliary: No focal liver abnormality is seen. No gallstones, gallbladder wall thickening, or biliary dilatation. Pancreas: Unremarkable. No pancreatic ductal dilatation or surrounding inflammatory changes. Spleen: Normal in size without focal abnormality. Adrenals/Urinary Tract: Adrenal glands appear normal. Stable nonobstructive calculus seen in lower pole of left kidney. No hydronephrosis or renal obstruction is noted. Urinary bladder is unremarkable. Stomach/Bowel: Stomach is within normal limits. Appendix appears normal. No evidence of bowel wall thickening, distention, or inflammatory changes. Sigmoid diverticulosis is noted without inflammation. Vascular/Lymphatic: Aortic atherosclerosis. No enlarged abdominal or pelvic lymph nodes. Reproductive: Status post hysterectomy. No adnexal masses. Other: No abdominal wall hernia or abnormality. No abdominopelvic ascites. Musculoskeletal: No acute or significant osseous findings. IMPRESSION:  Stable nonobstructive left renal calculus. No hydronephrosis or renal obstruction is noted. Sigmoid diverticulosis without inflammation. Aortic Atherosclerosis (ICD10-I70.0). Electronically Signed   By: Marijo Conception M.D.   On: 06/22/2019 13:14     Management plans discussed with the patient, family and they are in agreement.  CODE STATUS: Full Code   TOTAL TIME TAKING CARE OF THIS PATIENT: 35 minutes.    Larwence Tu M.D on 06/23/2019 at 10:31 AM  Between 7am to 6pm - Pager - [phone]  After 6pm go to [email or web address] - [employer] [facility]  Office  [phone]  CC: Primary care physician; Kirk Ruths, MD   Note: This dictation was prepared with Dragon dictation along with smaller phrase technology. Any transcriptional errors that result from this process are unintentional.

## 2019-06-23 NOTE — Plan of Care (Signed)
  Problem: Education: Goal: Knowledge of General Education information will improve Description: Including pain rating scale, medication(s)/side effects and non-pharmacologic comfort measures Outcome: Progressing   Problem: Health Behavior/Discharge Planning: Goal: Ability to manage health-related needs will improve Outcome: Progressing   Problem: Clinical Measurements: Goal: Ability to maintain clinical measurements within normal limits will improve Outcome: Progressing Goal: Will remain free from infection Outcome: Progressing Goal: Diagnostic test results will improve Outcome: Progressing Goal: Respiratory complications will improve Outcome: Progressing Goal: Cardiovascular complication will be avoided Outcome: Progressing   Problem: Activity: Goal: Risk for activity intolerance will decrease Outcome: Progressing   Problem: Nutrition: Goal: Adequate nutrition will be maintained Outcome: Progressing   Problem: Coping: Goal: Level of anxiety will decrease Outcome: Progressing   Problem: Elimination: Goal: Will not experience complications related to bowel motility Outcome: Not Applicable Goal: Will not experience complications related to urinary retention Outcome: Not Applicable   Problem: Pain Managment: Goal: General experience of comfort will improve Outcome: Progressing   Problem: Safety: Goal: Ability to remain free from injury will improve Outcome: Progressing   Problem: Skin Integrity: Goal: Risk for impaired skin integrity will decrease Outcome: Not Applicable

## 2019-06-23 NOTE — Progress Notes (Signed)
*  PRELIMINARY RESULTS* Echocardiogram 2D Echocardiogram has been performed.  Wallie Char Martyna Thorns 06/23/2019, 10:10 AM

## 2019-06-24 LAB — HIV ANTIBODY (ROUTINE TESTING W REFLEX): HIV Screen 4th Generation wRfx: NONREACTIVE

## 2019-08-15 ENCOUNTER — Emergency Department: Payer: Medicare Other

## 2019-08-15 ENCOUNTER — Other Ambulatory Visit: Payer: Self-pay

## 2019-08-15 ENCOUNTER — Emergency Department
Admission: EM | Admit: 2019-08-15 | Discharge: 2019-08-15 | Disposition: A | Discharge status: Home / Self Care | Payer: Medicare Other | Attending: Emergency Medicine | Admitting: Emergency Medicine

## 2019-08-15 DIAGNOSIS — Z7982 Long term (current) use of aspirin: Secondary | ICD-10-CM | POA: Insufficient documentation

## 2019-08-15 DIAGNOSIS — I110 Hypertensive heart disease with heart failure: Secondary | ICD-10-CM | POA: Insufficient documentation

## 2019-08-15 DIAGNOSIS — Z79899 Other long term (current) drug therapy: Secondary | ICD-10-CM | POA: Diagnosis not present

## 2019-08-15 DIAGNOSIS — Z7984 Long term (current) use of oral hypoglycemic drugs: Secondary | ICD-10-CM | POA: Diagnosis not present

## 2019-08-15 DIAGNOSIS — S8002XA Contusion of left knee, initial encounter: Secondary | ICD-10-CM

## 2019-08-15 DIAGNOSIS — W19XXXA Unspecified fall, initial encounter: Secondary | ICD-10-CM

## 2019-08-15 DIAGNOSIS — S92535A Nondisplaced fracture of distal phalanx of left lesser toe(s), initial encounter for closed fracture: Secondary | ICD-10-CM

## 2019-08-15 DIAGNOSIS — S8992XA Unspecified injury of left lower leg, initial encounter: Secondary | ICD-10-CM | POA: Diagnosis present

## 2019-08-15 DIAGNOSIS — Y9301 Activity, walking, marching and hiking: Secondary | ICD-10-CM | POA: Insufficient documentation

## 2019-08-15 DIAGNOSIS — Y998 Other external cause status: Secondary | ICD-10-CM | POA: Diagnosis not present

## 2019-08-15 DIAGNOSIS — Y929 Unspecified place or not applicable: Secondary | ICD-10-CM | POA: Diagnosis not present

## 2019-08-15 DIAGNOSIS — E119 Type 2 diabetes mellitus without complications: Secondary | ICD-10-CM | POA: Insufficient documentation

## 2019-08-15 DIAGNOSIS — F17228 Nicotine dependence, chewing tobacco, with other nicotine-induced disorders: Secondary | ICD-10-CM | POA: Diagnosis not present

## 2019-08-15 DIAGNOSIS — I509 Heart failure, unspecified: Secondary | ICD-10-CM | POA: Insufficient documentation

## 2019-08-15 DIAGNOSIS — W010XXA Fall on same level from slipping, tripping and stumbling without subsequent striking against object, initial encounter: Secondary | ICD-10-CM | POA: Diagnosis not present

## 2019-08-15 MED ORDER — ACETAMINOPHEN 325 MG PO TABS
650.0000 mg | ORAL_TABLET | Freq: Once | ORAL | Status: AC
  Administered 2019-08-15: 650 mg via ORAL
  Filled 2019-08-15: qty 2

## 2019-08-15 MED ORDER — TRAMADOL HCL 50 MG PO TABS: 50.0000 mg | ORAL_TABLET | Freq: Four times a day (QID) | ORAL | 0 refills | Status: DC | PRN

## 2019-08-15 NOTE — ED Triage Notes (Signed)
Patient reports she was visiting a friend at her new place and he stepped wrong causing her to fall.  Reports pain to left knee and left ankle pain.

## 2019-08-15 NOTE — Discharge Instructions (Signed)
Ice and elevate your knee to reduce swelling and help with pain.  Begin taking tramadol as needed for pain.  Your second and third toe were buddy taped for added support and wear postop shoe to prevent your toes from bending while you are walking.  If any continued problems make an appointment with Dr. Luana Shu who is the podiatrist on call [facility] clinic.  Watch the abrasion to your knee and your foot for any signs of infection.  Clean daily with mild soap and water.  Your toenail on your great toe may eventually fall off due to the injury.

## 2019-08-15 NOTE — ED Notes (Signed)
Pt state she fell off a walking ramp yesterday and now has abrasions to her left knee and the left great toe nail has lifted up from the nail bed.

## 2019-08-15 NOTE — ED Provider Notes (Signed)
[facility] Emergency Department Provider Note  ____________________________________________   First MD Initiated Contact with Patient 08/15/19 0710     (approximate)  I have reviewed the triage vital signs and the nursing notes.   HISTORY  Chief Complaint Ankle Pain, Leg Pain, and Fall   HPI Betty Luna is a 68 y.o. female presents to the ED with complaint of left foot pain and knee pain after falling last evening.  Patient states that she was visiting a friend and states that she stepped wrong causing her to fall.  She landed on her left knee and states that her foot hurts the worst.  Patient denies any head injury or loss of consciousness.  She does have abrasions to left lower extremity.  Patient states that she had a tetanus booster approximately 2 years ago.  Patient has continued to ambulate without any assistance.  Tetanus is up-to-date per patient.  Rates her pain as an 8 out of 10.      Past Medical History:  Diagnosis Date  . Anemia   . Arthritis   . CHF (congestive heart failure) (HCC)   . Depression   . Diabetes mellitus without complication (HCC)   . GERD (gastroesophageal reflux disease)   . Gout   . Hypertension     Patient Active Problem List   Diagnosis Date Noted  . Shortness of breath 06/22/2019  . Uncontrolled hypertension 06/22/2019  . SOB (shortness of breath) 06/22/2019  . Pyelonephritis 06/26/2017    Past Surgical History:  Procedure Laterality Date  . ABDOMINAL HYSTERECTOMY    . CATARACT EXTRACTION W/PHACO Right 04/16/2017   Procedure: CATARACT EXTRACTION PHACO AND INTRAOCULAR LENS PLACEMENT (IOC);  Surgeon: Galen Manila, MD;  Location: [facility] ORS;  Service: Ophthalmology;  Laterality: Right;  [place] 01:06 AP% 16.5 CDE 11.03 Fluid pack lot # [id number]  . CATARACT EXTRACTION W/PHACO Left 10/01/2017   Procedure: CATARACT EXTRACTION PHACO AND INTRAOCULAR LENS PLACEMENT (IOC)-LEFT DIABETIC;  Surgeon: Galen Manila, MD;  Location: [facility] ORS;  Service: Ophthalmology;  Laterality: Left;  [place] 01:05.7 AP% 12.2 CDE 7.42 Fluid pack lot # [id number]  . CTR Left   . TONSILLECTOMY      Prior to Admission medications   Medication Sig Start Date End Date Taking? Authorizing Provider  aspirin EC 81 MG tablet Take 81 mg by mouth daily.    [provider]  carvedilol (COREG) 25 MG tablet Take 25 mg by mouth 2 (two) times daily.  02/14/15   [provider]  colchicine 0.6 MG tablet Take 0.6 mg by mouth daily.    [provider]  escitalopram (LEXAPRO) 20 MG tablet Take 20 mg by mouth daily. 04/09/19 07/08/19  [provider]  famotidine (PEPCID) 40 MG tablet Take 1 tablet (40 mg total) by mouth every evening. 12/09/17 12/09/18  Rebecka Apley, MD  glimepiride (AMARYL) 2 MG tablet Take 2 mg by mouth daily. With breakfast 10/28/18 10/28/19  [provider]  JANUVIA 100 MG tablet Take 100 mg by mouth daily. 03/04/17   [provider]  losartan (COZAAR) 50 MG tablet Take 50 mg by mouth daily.  01/23/15   [provider]  metFORMIN (GLUCOPHAGE-XR) 500 MG 24 hr tablet Take 1,000 mg by mouth 2 (two) times daily.     [provider]  pantoprazole (PROTONIX) 40 MG tablet Take 40 mg by mouth daily. 03/07/17   [provider]  spironolactone (ALDACTONE) 25 MG tablet Take 25 mg by mouth daily.  [provider]  torsemide (DEMADEX) 10 MG tablet Take 10 mg by mouth daily.  03/23/15   [provider]  traMADol (ULTRAM) 50 MG tablet Take 1 tablet (50 mg total) by mouth every 6 (six) hours as needed. 08/15/19   Johnn Hai, PA-C    Allergies Omeprazole, Atorvastatin, and Codeine  Family History  Problem Relation Age of Onset  . Alcohol abuse Brother     Social History Social History   Tobacco Use  . Smoking status: Former Research scientist (life sciences)  . Smokeless tobacco: Current User    Types: Snuff  Substance Use Topics  . Alcohol use: Yes     Comment: occasonally  . Drug use: No    Review of Systems Constitutional: No fever/chills Eyes: No visual changes. ENT: No trauma. Cardiovascular: Denies chest pain. Respiratory: Denies shortness of breath. Gastrointestinal: No abdominal pain.  No nausea, no vomiting.  Musculoskeletal: Positive for left knee and foot pain. Skin: Positive for abrasions. Neurological: Negative for headaches, focal weakness or numbness. ____________________________________________   PHYSICAL EXAM:  VITAL SIGNS: ED Triage Vitals  Enc Vitals Group     BP 08/15/19 0658 (!) 156/72     Pulse Rate 08/15/19 0658 96     Resp 08/15/19 0658 18     Temp 08/15/19 0658 98.2 F (36.8 C)     Temp Source 08/15/19 0658 Oral     SpO2 08/15/19 0658 96 %     Weight 08/15/19 0653 227 lb (103 kg)     Height 08/15/19 0653 5\' 2"  (1.575 m)     Head Circumference --      Peak Flow --      Pain Score 08/15/19 0653 8     Pain Loc --      Pain Edu? --      Excl. in [place]? --    Constitutional: Alert and oriented. Well appearing and in no acute distress. Eyes: Conjunctivae are normal. PERRL. EOMI. Head: Atraumatic. Nose: No trauma. Neck: No stridor.  No tenderness on palpation of cervical spine posteriorly. Cardiovascular: Normal rate, regular rhythm. Grossly normal heart sounds.  Good peripheral circulation. Respiratory: Normal respiratory effort.  No retractions. Lungs CTAB. Musculoskeletal: On examination of the left knee there is no gross deformity and no effusion is present.  Patient is able to flex and extend her extremity but is guarded secondary to increased pain.  Moderate edema is noted on the dorsal aspect of the left foot.  There is an abrasion to the left great toe with the nail being partially avulsed but still intact.  No active bleeding was noted.  Patient is able to flex and extend her toes without difficulty and motor sensory function intact.  No point tenderness is noted on palpation of the ankle.  Neurologic:  Normal speech and language. No gross focal neurologic deficits are appreciated. No gait instability. Skin:  Skin is warm, dry.  There are superficial abrasions noted to the left knee and left foot.  No active bleeding is noted.  No foreign body present.  Patient also has a loose left great toenail.  Nail is intact at the base. Psychiatric: Mood and affect are normal. Speech and behavior are normal.  ____________________________________________   LABS (all labs ordered are listed, but only abnormal results are displayed)  Labs Reviewed - No data to display  RADIOLOGY  ED MD interpretation:  Left knee x-ray is negative for acute bony injury.  Left foot shows a nondisplaced fracture of the second digit  middle phalanx.  Official radiology report(s): Dg Knee Complete 4 Views Left  Result Date: 08/15/2019 CLINICAL DATA:  Fall. Left anterior knee abrasions EXAM: LEFT KNEE - COMPLETE 4+ VIEW COMPARISON:  08/01/2014 FINDINGS: Small suprapatellar joint effusion. No fractures. Patellofemoral joint osteoarthritis with exuberant marginal spur formation noted. Moderate medial and lateral compartment osteoarthritis is also noted. IMPRESSION: 1. Small suprapatellar joint effusion. 2. Osteoarthritis. 3. No fracture or dislocation. Electronically Signed   By: Signa Kell M.D.   On: 08/15/2019 08:10   Dg Foot Complete Left  Result Date: 08/15/2019 CLINICAL DATA:  Fall. Injury. EXAM: LEFT FOOT - COMPLETE 3+ VIEW COMPARISON:  08/01/14 FINDINGS: There is an acute fracture through the distal aspect of the second middle phalanx. The fracture fragments are in near anatomic alignment. Small posterior and plantar calcaneal heel spurs identified. Degenerative changes within the midfoot tarsal joints. IMPRESSION: 1. Acute fracture involves the distal aspect of the second middle phalanx. 2. Heel spurs. 3. Degenerative changes of the midfoot. Electronically Signed   By: Signa Kell M.D.   On:  08/15/2019 08:12    ____________________________________________   PROCEDURES  Procedure(s) performed (including Critical Care):  Procedures Buddy tape to the toes with a postop shoe.  ____________________________________________   INITIAL IMPRESSION / ASSESSMENT AND PLAN / ED COURSE  As part of my medical decision making, I reviewed the following data within the electronic MEDICAL RECORD NUMBER Notes from prior ED visits and [place] Controlled Substance Database  68 year old female presents to the ED with complaint of left lower extremity pain after she fell walking on a ramp yesterday at a friend's house.  She denies any head injury or loss of consciousness.  The fall was witnessed by her husband who is present today.  Patient continues to ambulate but states that this increases her pain.  Knee x-ray was negative for acute bony injury with a very mild effusion present.  Left foot shows a nondisplaced fracture of the second digit middle phalanx.  Evidence of a partial avulsion of the great nail without active bleeding.  Patient was encouraged to clean her foot daily and watch for any signs of infection.  She was given a prescription for tramadol if needed for pain.  She is also encouraged to follow-up with podiatry if any continued problems with her fractured toe.  Nonhealing or foot infection is a concerned as patient is diabetic.  ____________________________________________   FINAL CLINICAL IMPRESSION(S) / ED DIAGNOSES  Final diagnoses:  Contusion of left knee, initial encounter  Closed nondisplaced fracture of distal phalanx of lesser toe of left foot, initial encounter  Fall, initial encounter     ED Discharge Orders         Ordered    traMADol (ULTRAM) 50 MG tablet  Every 6 hours PRN     08/15/19 0829           Note:  This document was prepared using Dragon voice recognition software and may include unintentional dictation errors.    Tommi Rumps, PA-C 08/15/19 1530     Jene Every, MD 08/15/19 [phone]

## 2019-10-03 ENCOUNTER — Encounter: Payer: Self-pay | Admitting: Emergency Medicine

## 2019-10-03 ENCOUNTER — Other Ambulatory Visit: Payer: Self-pay

## 2019-10-03 ENCOUNTER — Emergency Department
Admission: EM | Admit: 2019-10-03 | Discharge: 2019-10-03 | Disposition: A | Discharge status: Home / Self Care | Payer: Medicare Other | Attending: Emergency Medicine | Admitting: Emergency Medicine

## 2019-10-03 DIAGNOSIS — N390 Urinary tract infection, site not specified: Secondary | ICD-10-CM | POA: Insufficient documentation

## 2019-10-03 DIAGNOSIS — E119 Type 2 diabetes mellitus without complications: Secondary | ICD-10-CM | POA: Insufficient documentation

## 2019-10-03 DIAGNOSIS — I509 Heart failure, unspecified: Secondary | ICD-10-CM | POA: Diagnosis not present

## 2019-10-03 DIAGNOSIS — Z7982 Long term (current) use of aspirin: Secondary | ICD-10-CM | POA: Insufficient documentation

## 2019-10-03 DIAGNOSIS — Z87891 Personal history of nicotine dependence: Secondary | ICD-10-CM | POA: Diagnosis not present

## 2019-10-03 DIAGNOSIS — Z79899 Other long term (current) drug therapy: Secondary | ICD-10-CM | POA: Insufficient documentation

## 2019-10-03 DIAGNOSIS — I110 Hypertensive heart disease with heart failure: Secondary | ICD-10-CM | POA: Diagnosis not present

## 2019-10-03 DIAGNOSIS — M545 Low back pain: Secondary | ICD-10-CM | POA: Diagnosis present

## 2019-10-03 LAB — URINALYSIS, COMPLETE (UACMP) WITH MICROSCOPIC
Bilirubin Urine: NEGATIVE
Glucose, UA: NEGATIVE mg/dL
Hgb urine dipstick: NEGATIVE
Ketones, ur: NEGATIVE mg/dL
Nitrite: POSITIVE — AB
Protein, ur: NEGATIVE mg/dL
Specific Gravity, Urine: 1.012 (ref 1.005–1.030)
pH: 5 (ref 5.0–8.0)

## 2019-10-03 MED ORDER — SULFAMETHOXAZOLE-TRIMETHOPRIM 800-160 MG PO TABS: 1.0000 | ORAL_TABLET | Freq: Two times a day (BID) | ORAL | 0 refills | Status: DC

## 2019-10-03 MED ORDER — KETOROLAC TROMETHAMINE 60 MG/2ML IM SOLN
30.0000 mg | Freq: Once | INTRAMUSCULAR | Status: AC
  Administered 2019-10-03: 10:00:00 30 mg via INTRAMUSCULAR
  Filled 2019-10-03: qty 2

## 2019-10-03 MED ORDER — PHENAZOPYRIDINE HCL 200 MG PO TABS: 200.0000 mg | ORAL_TABLET | Freq: Three times a day (TID) | ORAL | 0 refills | Status: DC | PRN

## 2019-10-03 MED ORDER — TRAMADOL HCL 50 MG PO TABS: 50.0000 mg | ORAL_TABLET | Freq: Four times a day (QID) | ORAL | 0 refills | Status: DC | PRN

## 2019-10-03 NOTE — ED Provider Notes (Signed)
[facility] Emergency Department Provider Note   ____________________________________________   First MD Initiated Contact with Patient 10/03/19 [phone]     (approximate)  I have reviewed the triage vital signs and the nursing notes.   HISTORY  Chief Complaint Back Pain and Dysuria    HPI Betty Luna is a 68 y.o. female patient complain of bilateral low back pain for 2 to 3 days.  Patient state onset of dysuria with a.m. awakening.  Patient denies fever chills associated complaint.  Patient denies vaginal discharge.  Patient rates pain as 8/10.  Patient described the pain as "aching".  No palliative measure for complaint.         Past Medical History:  Diagnosis Date  . Anemia   . Arthritis   . CHF (congestive heart failure) (HCC)   . Depression   . Diabetes mellitus without complication (HCC)   . GERD (gastroesophageal reflux disease)   . Gout   . Hypertension     Patient Active Problem List   Diagnosis Date Noted  . Shortness of breath 06/22/2019  . Uncontrolled hypertension 06/22/2019  . SOB (shortness of breath) 06/22/2019  . Pyelonephritis 06/26/2017    Past Surgical History:  Procedure Laterality Date  . ABDOMINAL HYSTERECTOMY    . CATARACT EXTRACTION W/PHACO Right 04/16/2017   Procedure: CATARACT EXTRACTION PHACO AND INTRAOCULAR LENS PLACEMENT (IOC);  Surgeon: Galen Manila, MD;  Location: [facility] ORS;  Service: Ophthalmology;  Laterality: Right;  [place] 01:06 AP% 16.5 CDE 11.03 Fluid pack lot # [id number]  . CATARACT EXTRACTION W/PHACO Left 10/01/2017   Procedure: CATARACT EXTRACTION PHACO AND INTRAOCULAR LENS PLACEMENT (IOC)-LEFT DIABETIC;  Surgeon: Galen Manila, MD;  Location: [facility] ORS;  Service: Ophthalmology;  Laterality: Left;  [place] 01:05.7 AP% 12.2 CDE 7.42 Fluid pack lot # [id number]  . CTR Left   . TONSILLECTOMY      Prior to Admission medications   Medication Sig Start Date End Date Taking? Authorizing Provider   aspirin EC 81 MG tablet Take 81 mg by mouth daily.    [provider]  carvedilol (COREG) 25 MG tablet Take 25 mg by mouth 2 (two) times daily.  02/14/15   [provider]  colchicine 0.6 MG tablet Take 0.6 mg by mouth daily.    [provider]  escitalopram (LEXAPRO) 20 MG tablet Take 20 mg by mouth daily. 04/09/19 07/08/19  [provider]  famotidine (PEPCID) 40 MG tablet Take 1 tablet (40 mg total) by mouth every evening. 12/09/17 12/09/18  Rebecka Apley, MD  glimepiride (AMARYL) 2 MG tablet Take 2 mg by mouth daily. With breakfast 10/28/18 10/28/19  [provider]  JANUVIA 100 MG tablet Take 100 mg by mouth daily. 03/04/17   [provider]  losartan (COZAAR) 50 MG tablet Take 50 mg by mouth daily.  01/23/15   [provider]  metFORMIN (GLUCOPHAGE-XR) 500 MG 24 hr tablet Take 1,000 mg by mouth 2 (two) times daily.     [provider]  pantoprazole (PROTONIX) 40 MG tablet Take 40 mg by mouth daily. 03/07/17   [provider]  phenazopyridine (PYRIDIUM) 200 MG tablet Take 1 tablet (200 mg total) by mouth 3 (three) times daily as needed for pain. 10/03/19   Joni Reining, PA-C  spironolactone (ALDACTONE) 25 MG tablet Take 25 mg by mouth daily.    [provider]  sulfamethoxazole-trimethoprim (BACTRIM DS) 800-160 MG tablet Take 1 tablet by mouth 2 (two) times daily. 10/03/19  Sable Feil, PA-C  torsemide (DEMADEX) 10 MG tablet Take 10 mg by mouth daily.  03/23/15   [provider]  traMADol (ULTRAM) 50 MG tablet Take 1 tablet (50 mg total) by mouth every 6 (six) hours as needed. 08/15/19   Johnn Hai, PA-C  traMADol (ULTRAM) 50 MG tablet Take 1 tablet (50 mg total) by mouth every 6 (six) hours as needed for moderate pain. 10/03/19   Sable Feil, PA-C    Allergies Omeprazole, Atorvastatin, and Codeine  Family History  Problem Relation Age of Onset  . Alcohol abuse Brother      Social History Social History   Tobacco Use  . Smoking status: Former Research scientist (life sciences)  . Smokeless tobacco: Current User    Types: Snuff  Substance Use Topics  . Alcohol use: Yes    Comment: occasonally  . Drug use: No    Review of Systems  Constitutional: No fever/chills Eyes: No visual changes. ENT: No sore throat. Cardiovascular: Denies chest pain. Respiratory: Denies shortness of breath. Gastrointestinal: No abdominal pain.  No nausea, no vomiting.  No diarrhea.  No constipation. Genitourinary: Positive for dysuria. Musculoskeletal: Bilateral flank pain. Skin: Negative for rash. Neurological: Negative for headaches, focal weakness or numbness. Psychiatric:  Depression Endocrine:  Diabetes, hypertension, and gout. Allergic/Immunilogical: Atorvastatin, codeine, and omeprazole  ____________________________________________   PHYSICAL EXAM:  VITAL SIGNS: ED Triage Vitals  Enc Vitals Group     BP 10/03/19 0859 (!) 171/84     Pulse Rate 10/03/19 0859 96     Resp 10/03/19 0859 18     Temp 10/03/19 0859 98.7 F (37.1 C)     Temp Source 10/03/19 0859 Oral     SpO2 10/03/19 0858 99 %     Weight 10/03/19 0859 231 lb (104.8 kg)     Height 10/03/19 0859 5\' 2"  (1.575 m)     Head Circumference --      Peak Flow --      Pain Score 10/03/19 0859 8     Pain Loc --      Pain Edu? --      Excl. in [place]? --    Constitutional: Alert and oriented. Well appearing and in no acute distress. Cardiovascular: Normal rate, regular rhythm. Grossly normal heart sounds.  Good peripheral circulation.  Evaded blood pressure. Respiratory: Normal respiratory effort.  No retractions. Lungs CTAB. Gastrointestinal: Soft and nontender. No distention. No abdominal bruits.  Bilateral CVA tenderness. Genitourinary: Deferred Musculoskeletal: No obvious lumbar spine deformity.  Nontender to palpation on the lumbar spinal processes.  No lower extremity tenderness nor edema.  No joint effusions. Neurologic:   Normal speech and language. No gross focal neurologic deficits are appreciated. No gait instability. Skin:  Skin is warm, dry and intact. No rash noted. Psychiatric: Mood and affect are normal. Speech and behavior are normal.  ____________________________________________   LABS (all labs ordered are listed, but only abnormal results are displayed)  Labs Reviewed  URINALYSIS, COMPLETE (UACMP) WITH MICROSCOPIC - Abnormal; Notable for the following components:      Result Value   Color, Urine YELLOW (*)    APPearance HAZY (*)    Nitrite POSITIVE (*)    Leukocytes,Ua SMALL (*)    Bacteria, UA RARE (*)    All other components within normal limits   ____________________________________________  EKG   ____________________________________________  RADIOLOGY  ED MD interpretation:    Official radiology report(s): No results found.  ____________________________________________   PROCEDURES  Procedure(s) performed (including  Critical Care):  Procedures   ____________________________________________   INITIAL IMPRESSION / ASSESSMENT AND PLAN / ED COURSE  As part of my medical decision making, I reviewed the following data within the electronic MEDICAL RECORD NUMBER     Patient presents for bilateral low back pain for 2 to 3 days.  Onset of dysuria today.  Discussed lab results with patient consistent with urinary tract infection.  Patient given discharge care instruction advised take medication as directed.  Patient advised follow-up with PCP in 10 days to have urine retested.    Betty Luna was evaluated in Emergency Department on 10/03/2019 for the symptoms described in the history of present illness. She was evaluated in the context of the global COVID-19 pandemic, which necessitated consideration that the patient might be at risk for infection with the SARS-CoV-2 virus that causes COVID-19. Institutional protocols and algorithms that pertain to the evaluation of patients  at risk for COVID-19 are in a state of rapid change based on information released by regulatory bodies including the CDC and federal and state organizations. These policies and algorithms were followed during the patient's care in the ED.       ____________________________________________   FINAL CLINICAL IMPRESSION(S) / ED DIAGNOSES  Final diagnoses:  Lower urinary tract infectious disease     ED Discharge Orders         Ordered    phenazopyridine (PYRIDIUM) 200 MG tablet  3 times daily PRN     10/03/19 1017    traMADol (ULTRAM) 50 MG tablet  Every 6 hours PRN     10/03/19 1017    sulfamethoxazole-trimethoprim (BACTRIM DS) 800-160 MG tablet  2 times daily     10/03/19 1017           Note:  This document was prepared using Dragon voice recognition software and may include unintentional dictation errors.    Joni Reining, PA-C 10/03/19 1019    Chesley Noon, MD 10/03/19 [phone]

## 2019-10-03 NOTE — ED Triage Notes (Signed)
Pt via pov from home with lower back pain x 2-3 days. Pt also states that painful urination started today. Pt alert & oriented with NAD noted.

## 2019-10-03 NOTE — ED Notes (Signed)
First Nurse Note: Pt ambulatory into ED c/o lower back pain. Pt states that she thinks she has UTI. Pt is in NAD.

## 2019-10-03 NOTE — ED Notes (Signed)
See triage note  Presents with lower back pain  States pain started couple of days ago  States pain is non radiating   Ambulates well  States she did have some dysuria

## 2019-12-24 ENCOUNTER — Emergency Department
Admission: EM | Admit: 2019-12-24 | Discharge: 2019-12-24 | Disposition: A | Discharge status: Home / Self Care | Payer: Medicare Other | Attending: Emergency Medicine | Admitting: Emergency Medicine

## 2019-12-24 ENCOUNTER — Other Ambulatory Visit: Payer: Self-pay

## 2019-12-24 ENCOUNTER — Encounter: Payer: Self-pay | Admitting: Intensive Care

## 2019-12-24 DIAGNOSIS — Z7982 Long term (current) use of aspirin: Secondary | ICD-10-CM | POA: Diagnosis not present

## 2019-12-24 DIAGNOSIS — F17228 Nicotine dependence, chewing tobacco, with other nicotine-induced disorders: Secondary | ICD-10-CM | POA: Diagnosis not present

## 2019-12-24 DIAGNOSIS — M545 Low back pain: Secondary | ICD-10-CM | POA: Diagnosis present

## 2019-12-24 DIAGNOSIS — Z7984 Long term (current) use of oral hypoglycemic drugs: Secondary | ICD-10-CM | POA: Diagnosis not present

## 2019-12-24 DIAGNOSIS — I509 Heart failure, unspecified: Secondary | ICD-10-CM | POA: Diagnosis not present

## 2019-12-24 DIAGNOSIS — I110 Hypertensive heart disease with heart failure: Secondary | ICD-10-CM | POA: Diagnosis not present

## 2019-12-24 DIAGNOSIS — E119 Type 2 diabetes mellitus without complications: Secondary | ICD-10-CM | POA: Diagnosis not present

## 2019-12-24 DIAGNOSIS — R3915 Urgency of urination: Secondary | ICD-10-CM | POA: Diagnosis not present

## 2019-12-24 DIAGNOSIS — R82998 Other abnormal findings in urine: Secondary | ICD-10-CM | POA: Insufficient documentation

## 2019-12-24 DIAGNOSIS — Z79899 Other long term (current) drug therapy: Secondary | ICD-10-CM | POA: Insufficient documentation

## 2019-12-24 DIAGNOSIS — N390 Urinary tract infection, site not specified: Secondary | ICD-10-CM | POA: Insufficient documentation

## 2019-12-24 LAB — URINALYSIS, COMPLETE (UACMP) WITH MICROSCOPIC
Bilirubin Urine: NEGATIVE
Glucose, UA: NEGATIVE mg/dL
Hgb urine dipstick: NEGATIVE
Ketones, ur: NEGATIVE mg/dL
Nitrite: NEGATIVE
Protein, ur: NEGATIVE mg/dL
Specific Gravity, Urine: 1.005 (ref 1.005–1.030)
pH: 5 (ref 5.0–8.0)

## 2019-12-24 MED ORDER — SULFAMETHOXAZOLE-TRIMETHOPRIM 800-160 MG PO TABS: 1.0000 | ORAL_TABLET | Freq: Two times a day (BID) | ORAL | 0 refills | Status: DC

## 2019-12-24 MED ORDER — SULFAMETHOXAZOLE-TRIMETHOPRIM 800-160 MG PO TABS
1.0000 | ORAL_TABLET | Freq: Once | ORAL | Status: AC
  Administered 2019-12-24: 1 via ORAL
  Filled 2019-12-24: qty 1

## 2019-12-24 NOTE — ED Provider Notes (Signed)
[facility] Emergency Department Provider Note  ____________________________________________  Time seen: Approximately 9:18 AM  I have reviewed the triage vital signs and the nursing notes.   HISTORY  Chief Complaint Back Pain and Urinary Tract Infection    HPI Betty Luna is a 69 y.o. female that presents to the emergency department for evaluation of right-sided low back pain for 2 weekw.  Patient also states that her urine has had a foul odor and she has had urinary urgency.  She had routine blood work done last week and a urinalysis and her doctor told her that she did not have a urinary tract infection at that time.  Patient does have chronic back pain.  No dysuria or hematuria.  No fever, dizziness, headache, shortness breath, chest pain, nausea, vomiting, abdominal pain, diarrhea.   Past Medical History:  Diagnosis Date  . Anemia   . Arthritis   . CHF (congestive heart failure) (HCC)   . Depression   . Diabetes mellitus without complication (HCC)   . GERD (gastroesophageal reflux disease)   . Gout   . Hypertension     Patient Active Problem List   Diagnosis Date Noted  . Shortness of breath 06/22/2019  . Uncontrolled hypertension 06/22/2019  . SOB (shortness of breath) 06/22/2019  . Pyelonephritis 06/26/2017    Past Surgical History:  Procedure Laterality Date  . ABDOMINAL HYSTERECTOMY    . CATARACT EXTRACTION W/PHACO Right 04/16/2017   Procedure: CATARACT EXTRACTION PHACO AND INTRAOCULAR LENS PLACEMENT (IOC);  Surgeon: Galen Manila, MD;  Location: [facility] ORS;  Service: Ophthalmology;  Laterality: Right;  [place] 01:06 AP% 16.5 CDE 11.03 Fluid pack lot # [id number]  . CATARACT EXTRACTION W/PHACO Left 10/01/2017   Procedure: CATARACT EXTRACTION PHACO AND INTRAOCULAR LENS PLACEMENT (IOC)-LEFT DIABETIC;  Surgeon: Galen Manila, MD;  Location: [facility] ORS;  Service: Ophthalmology;  Laterality: Left;  [place] 01:05.7 AP% 12.2 CDE 7.42 Fluid pack  lot # [id number]  . CTR Left   . TONSILLECTOMY      Prior to Admission medications   Medication Sig Start Date End Date Taking? Authorizing Provider  aspirin EC 81 MG tablet Take 81 mg by mouth daily.    [provider]  carvedilol (COREG) 25 MG tablet Take 25 mg by mouth 2 (two) times daily.  02/14/15   [provider]  colchicine 0.6 MG tablet Take 0.6 mg by mouth daily.    [provider]  escitalopram (LEXAPRO) 20 MG tablet Take 20 mg by mouth daily. 04/09/19 07/08/19  [provider]  famotidine (PEPCID) 40 MG tablet Take 1 tablet (40 mg total) by mouth every evening. 12/09/17 12/09/18  Rebecka Apley, MD  glimepiride (AMARYL) 2 MG tablet Take 2 mg by mouth daily. With breakfast 10/28/18 10/28/19  [provider]  JANUVIA 100 MG tablet Take 100 mg by mouth daily. 03/04/17   [provider]  losartan (COZAAR) 50 MG tablet Take 50 mg by mouth daily.  01/23/15   [provider]  metFORMIN (GLUCOPHAGE-XR) 500 MG 24 hr tablet Take 1,000 mg by mouth 2 (two) times daily.     [provider]  pantoprazole (PROTONIX) 40 MG tablet Take 40 mg by mouth daily. 03/07/17   [provider]  phenazopyridine (PYRIDIUM) 200 MG tablet Take 1 tablet (200 mg total) by mouth 3 (three) times daily as needed for pain. 10/03/19   Joni Reining, PA-C  spironolactone (ALDACTONE) 25 MG tablet Take 25 mg by mouth daily.  [provider]  sulfamethoxazole-trimethoprim (BACTRIM DS) 800-160 MG tablet Take 1 tablet by mouth 2 (two) times daily. 12/24/19   Laban Emperor, PA-C  torsemide (DEMADEX) 10 MG tablet Take 10 mg by mouth daily.  03/23/15   [provider]  traMADol (ULTRAM) 50 MG tablet Take 1 tablet (50 mg total) by mouth every 6 (six) hours as needed. 08/15/19   Johnn Hai, PA-C  traMADol (ULTRAM) 50 MG tablet Take 1 tablet (50 mg total) by mouth every 6 (six) hours as needed for moderate pain. 10/03/19   Sable Feil, PA-C    Allergies Omeprazole, Atorvastatin, and Codeine  Family History  Problem Relation Age of Onset  . Alcohol abuse Brother     Social History Social History   Tobacco Use  . Smoking status: Former Research scientist (life sciences)  . Smokeless tobacco: Current User    Types: Snuff  Substance Use Topics  . Alcohol use: Yes    Comment: occasonally  . Drug use: No     Review of Systems  Constitutional: No fever/chills ENT: No upper respiratory complaints. Cardiovascular: No chest pain. Respiratory: No cough. No SOB. Gastrointestinal: No abdominal pain.  No nausea, no vomiting.  Genitourinary: Negative for dysuria.  Positive for urgency. Musculoskeletal: Positive for low back pain. Skin: Negative for rash, abrasions, lacerations, ecchymosis. Neurological: Negative for headaches, numbness or tingling   ____________________________________________   PHYSICAL EXAM:  VITAL SIGNS: ED Triage Vitals  Enc Vitals Group     BP 12/24/19 0907 (!) 158/83     Pulse Rate 12/24/19 0907 95     Resp 12/24/19 0907 16     Temp 12/24/19 0907 98.2 F (36.8 C)     Temp Source 12/24/19 0907 Oral     SpO2 12/24/19 0907 98 %     Weight 12/24/19 0851 230 lb (104.3 kg)     Height 12/24/19 0851 5\' 2"  (1.575 m)     Head Circumference --      Peak Flow --      Pain Score 12/24/19 0851 10     Pain Loc --      Pain Edu? --      Excl. in [place]? --      Constitutional: Alert and oriented. Well appearing and in no acute distress. Eyes: Conjunctivae are normal. PERRL. EOMI. Head: Atraumatic. ENT:      Ears:      Nose: No congestion/rhinnorhea.      Mouth/Throat: Mucous membranes are moist.  Neck: No stridor.   Cardiovascular: Normal rate, regular rhythm.  Good peripheral circulation. Respiratory: Normal respiratory effort without tachypnea or retractions. Lungs CTAB. Good air entry to the bases with no decreased or absent breath sounds. Gastrointestinal: Bowel sounds 4 quadrants. Soft and nontender to  palpation. No guarding or rigidity. No palpable masses. No distention. No CVA tenderness. Musculoskeletal: Full range of motion to all extremities. No gross deformities appreciated.  No tenderness to palpation over lumbar spine.  Strength equal in lower extremities bilaterally.  Neurologic:  Normal speech and language. No gross focal neurologic deficits are appreciated.  Skin:  Skin is warm, dry and intact. No rash noted. Psychiatric: Mood and affect are normal. Speech and behavior are normal. Patient exhibits appropriate insight and judgement.   ____________________________________________   LABS (all labs ordered are listed, but only abnormal results are displayed)  Labs Reviewed  URINALYSIS, COMPLETE (UACMP) WITH MICROSCOPIC - Abnormal; Notable for the following components:      Result Value  Color, Urine YELLOW (*)    APPearance CLEAR (*)    Leukocytes,Ua SMALL (*)    Bacteria, UA MANY (*)    All other components within normal limits   ____________________________________________  EKG   ____________________________________________  RADIOLOGY   No results found.  ____________________________________________    PROCEDURES  Procedure(s) performed:    Procedures    Medications  sulfamethoxazole-trimethoprim (BACTRIM DS) 800-160 MG per tablet 1 tablet (1 tablet Oral Given 12/24/19 1009)     ____________________________________________   INITIAL IMPRESSION / ASSESSMENT AND PLAN / ED COURSE  Pertinent labs & imaging results that were available during my care of the patient were reviewed by me and considered in my medical decision making (see chart for details).  Review of the [place] CSRS was performed in accordance of the [facility] prior to dispensing any controlled drugs.   Patient's diagnosis is consistent with UTI.  Urinalysis is consistent with infection.  Patient will be discharged home with prescriptions for Bactrim.  Patient tolerated Bactrim previously for UTI  without difficulty and with resolution of symptoms.  Patient is to follow up with PCP as directed. Patient is given ED precautions to return to the ED for any worsening or new symptoms.  Betty Luna was evaluated in Emergency Department on 12/24/2019 for the symptoms described in the history of present illness. She was evaluated in the context of the global COVID-19 pandemic, which necessitated consideration that the patient might be at risk for infection with the SARS-CoV-2 virus that causes COVID-19. Institutional protocols and algorithms that pertain to the evaluation of patients at risk for COVID-19 are in a state of rapid change based on information released by regulatory bodies including the CDC and federal and state organizations. These policies and algorithms were followed during the patient's care in the ED.   ____________________________________________  FINAL CLINICAL IMPRESSION(S) / ED DIAGNOSES  Final diagnoses:  Lower urinary tract infectious disease      NEW MEDICATIONS STARTED DURING THIS VISIT:  ED Discharge Orders         Ordered    sulfamethoxazole-trimethoprim (BACTRIM DS) 800-160 MG tablet  2 times daily     12/24/19 0947              This chart was dictated using voice recognition software/Dragon. Despite best efforts to proofread, errors can occur which can change the meaning. Any change was purely unintentional.    Enid Derry, PA-C 12/24/19 1259    Jene Every, MD 12/24/19 [phone]

## 2019-12-24 NOTE — ED Triage Notes (Signed)
Patient c/o lower back pain X2 weeks and possible UTI. Denies urinary symptoms. Denies injury

## 2020-02-17 ENCOUNTER — Emergency Department
Admission: EM | Admit: 2020-02-17 | Discharge: 2020-02-17 | Disposition: A | Discharge status: Home / Self Care | Payer: Medicare Other | Attending: Emergency Medicine | Admitting: Emergency Medicine

## 2020-02-17 ENCOUNTER — Emergency Department: Payer: Medicare Other

## 2020-02-17 ENCOUNTER — Other Ambulatory Visit: Payer: Self-pay

## 2020-02-17 DIAGNOSIS — Z7984 Long term (current) use of oral hypoglycemic drugs: Secondary | ICD-10-CM | POA: Insufficient documentation

## 2020-02-17 DIAGNOSIS — R109 Unspecified abdominal pain: Secondary | ICD-10-CM | POA: Diagnosis present

## 2020-02-17 DIAGNOSIS — F17220 Nicotine dependence, chewing tobacco, uncomplicated: Secondary | ICD-10-CM | POA: Insufficient documentation

## 2020-02-17 DIAGNOSIS — I110 Hypertensive heart disease with heart failure: Secondary | ICD-10-CM | POA: Insufficient documentation

## 2020-02-17 DIAGNOSIS — E119 Type 2 diabetes mellitus without complications: Secondary | ICD-10-CM | POA: Diagnosis not present

## 2020-02-17 DIAGNOSIS — R1012 Left upper quadrant pain: Secondary | ICD-10-CM | POA: Diagnosis not present

## 2020-02-17 DIAGNOSIS — Z7982 Long term (current) use of aspirin: Secondary | ICD-10-CM | POA: Insufficient documentation

## 2020-02-17 DIAGNOSIS — I509 Heart failure, unspecified: Secondary | ICD-10-CM | POA: Diagnosis not present

## 2020-02-17 DIAGNOSIS — Z79899 Other long term (current) drug therapy: Secondary | ICD-10-CM | POA: Insufficient documentation

## 2020-02-17 LAB — CBC
HCT: 38 % (ref 36.0–46.0)
Hemoglobin: 12 g/dL (ref 12.0–15.0)
MCH: 24 pg — ABNORMAL LOW (ref 26.0–34.0)
MCHC: 31.6 g/dL (ref 30.0–36.0)
MCV: 75.8 fL — ABNORMAL LOW (ref 80.0–100.0)
Platelets: 251 10*3/uL (ref 150–400)
RBC: 5.01 MIL/uL (ref 3.87–5.11)
RDW: 16.8 % — ABNORMAL HIGH (ref 11.5–15.5)
WBC: 7.8 10*3/uL (ref 4.0–10.5)
nRBC: 0 % (ref 0.0–0.2)

## 2020-02-17 LAB — COMPREHENSIVE METABOLIC PANEL
ALT: 26 U/L (ref 0–44)
AST: 23 U/L (ref 15–41)
Albumin: 3.8 g/dL (ref 3.5–5.0)
Alkaline Phosphatase: 85 U/L (ref 38–126)
Anion gap: 10 (ref 5–15)
BUN: 14 mg/dL (ref 8–23)
CO2: 27 mmol/L (ref 22–32)
Calcium: 9.1 mg/dL (ref 8.9–10.3)
Chloride: 104 mmol/L (ref 98–111)
Creatinine, Ser: 0.72 mg/dL (ref 0.44–1.00)
GFR calc Af Amer: >60 mL/min (ref >60)
GFR calc non Af Amer: >60 mL/min (ref >60)
Glucose, Bld: 173 mg/dL — ABNORMAL HIGH (ref 70–99)
Potassium: 3.7 mmol/L (ref 3.5–5.1)
Sodium: 141 mmol/L (ref 135–145)
Total Bilirubin: 0.7 mg/dL (ref 0.3–1.2)
Total Protein: 7.5 g/dL (ref 6.5–8.1)

## 2020-02-17 LAB — LIPASE, BLOOD: Lipase: 21 U/L (ref 11–51)

## 2020-02-17 LAB — TROPONIN I (HIGH SENSITIVITY)
Troponin I (High Sensitivity): 12 ng/L (ref <18)
Troponin I (High Sensitivity): 13 ng/L (ref <18)

## 2020-02-17 MED ORDER — SODIUM CHLORIDE 0.9% FLUSH: 3.0000 mL | Freq: Once | INTRAVENOUS | Status: DC

## 2020-02-17 NOTE — ED Notes (Signed)
Pt taken to XR. Will obtain repeat trop and EKG when pt returns

## 2020-02-17 NOTE — Discharge Instructions (Signed)
No signs of a heart attack or pneumonia.  You can take Tylenol 1 g every 8 hours if develop the pain again.  You return to the ER if develop worsening shortness of breath, pain in your abdomen, rash or any other concerns

## 2020-02-17 NOTE — ED Triage Notes (Signed)
Pt comes via POV from home with c/o left sided rib pain and some abdominal pain. Pt states this started today. Pt unsure if it is just gas.  Pt denies any recent injuries.

## 2020-02-17 NOTE — ED Provider Notes (Signed)
[facility] Emergency Department Provider Note  ____________________________________________   First MD Initiated Contact with Patient 02/17/20 1816     (approximate)  I have reviewed the triage vital signs and the nursing notes.   HISTORY  Chief Complaint Abdominal Pain    HPI Betty Luna is a 69 y.o. female with diabetes, CHF, hypertension who comes in for left upper quadrant abdominal pain.  Patient reports since this morning having a few episodes of sharp stabbing  pain underneath her left breast.  The pain is intermittent, unclear what brings it on, nothing makes it worse or better.  States it last for a few seconds and goes away.  Patient stated that she just want to make sure that her heart looked okay.  Denies really the pain going up into her chest but has had some acid reflux history and constipation which sound to be at baseline for her.  Still having stools and no vomiting.  She denies any urinary symptoms or pain radiating to her groin or hematuria.  She denies there being any pain since coming to the ER 2 hours ago.  She does have a little bit more of a cough than normal but denies any shortness of breath.          Past Medical History:  Diagnosis Date  . Anemia   . Arthritis   . CHF (congestive heart failure) (HCC)   . Depression   . Diabetes mellitus without complication (HCC)   . GERD (gastroesophageal reflux disease)   . Gout   . Hypertension     Patient Active Problem List   Diagnosis Date Noted  . Shortness of breath 06/22/2019  . Uncontrolled hypertension 06/22/2019  . SOB (shortness of breath) 06/22/2019  . Pyelonephritis 06/26/2017    Past Surgical History:  Procedure Laterality Date  . ABDOMINAL HYSTERECTOMY    . CATARACT EXTRACTION W/PHACO Right 04/16/2017   Procedure: CATARACT EXTRACTION PHACO AND INTRAOCULAR LENS PLACEMENT (IOC);  Surgeon: Galen Manila, MD;  Location: [facility] ORS;  Service: Ophthalmology;   Laterality: Right;  [place] 01:06 AP% 16.5 CDE 11.03 Fluid pack lot # [id number]  . CATARACT EXTRACTION W/PHACO Left 10/01/2017   Procedure: CATARACT EXTRACTION PHACO AND INTRAOCULAR LENS PLACEMENT (IOC)-LEFT DIABETIC;  Surgeon: Galen Manila, MD;  Location: [facility] ORS;  Service: Ophthalmology;  Laterality: Left;  [place] 01:05.7 AP% 12.2 CDE 7.42 Fluid pack lot # [id number]  . CTR Left   . TONSILLECTOMY      Prior to Admission medications   Medication Sig Start Date End Date Taking? Authorizing Provider  aspirin EC 81 MG tablet Take 81 mg by mouth daily.    [provider]  carvedilol (COREG) 25 MG tablet Take 25 mg by mouth 2 (two) times daily.  02/14/15   [provider]  colchicine 0.6 MG tablet Take 0.6 mg by mouth daily.    [provider]  escitalopram (LEXAPRO) 20 MG tablet Take 20 mg by mouth daily. 04/09/19 07/08/19  [provider]  famotidine (PEPCID) 40 MG tablet Take 1 tablet (40 mg total) by mouth every evening. 12/09/17 12/09/18  Rebecka Apley, MD  glimepiride (AMARYL) 2 MG tablet Take 2 mg by mouth daily. With breakfast 10/28/18 10/28/19  [provider]  JANUVIA 100 MG tablet Take 100 mg by mouth daily. 03/04/17   [provider]  losartan (COZAAR) 50 MG tablet Take 50 mg by mouth daily.  01/23/15   [provider]  metFORMIN (GLUCOPHAGE-XR) 500 MG  24 hr tablet Take 1,000 mg by mouth 2 (two) times daily.     [provider]  pantoprazole (PROTONIX) 40 MG tablet Take 40 mg by mouth daily. 03/07/17   [provider]  phenazopyridine (PYRIDIUM) 200 MG tablet Take 1 tablet (200 mg total) by mouth 3 (three) times daily as needed for pain. 10/03/19   Joni Reining, PA-C  spironolactone (ALDACTONE) 25 MG tablet Take 25 mg by mouth daily.    [provider]  sulfamethoxazole-trimethoprim (BACTRIM DS) 800-160 MG tablet Take 1 tablet by mouth 2 (two) times daily. 12/24/19   Enid Derry, PA-C  torsemide  (DEMADEX) 10 MG tablet Take 10 mg by mouth daily.  03/23/15   [provider]  traMADol (ULTRAM) 50 MG tablet Take 1 tablet (50 mg total) by mouth every 6 (six) hours as needed. 08/15/19   Tommi Rumps, PA-C  traMADol (ULTRAM) 50 MG tablet Take 1 tablet (50 mg total) by mouth every 6 (six) hours as needed for moderate pain. 10/03/19   Joni Reining, PA-C    Allergies Omeprazole, Atorvastatin, and Codeine  Family History  Problem Relation Age of Onset  . Alcohol abuse Brother     Social History Social History   Tobacco Use  . Smoking status: Former Games developer  . Smokeless tobacco: Current User    Types: Snuff  Substance Use Topics  . Alcohol use: Yes    Comment: occasonally  . Drug use: No      Review of Systems Constitutional: No fever/chills Eyes: No visual changes. ENT: No sore throat. Cardiovascular: Denies chest pain. Respiratory: Denies shortness of breath.  Cough  gastrointestinal: Sharp stabbing pain underneath the left breast no nausea, no vomiting.  No diarrhea.  No constipation. Genitourinary: Negative for dysuria. Musculoskeletal: Negative for back pain. Skin: Negative for rash. Neurological: Negative for headaches, focal weakness or numbness. All other ROS negative ____________________________________________   PHYSICAL EXAM:  VITAL SIGNS: ED Triage Vitals  Enc Vitals Group     BP 02/17/20 1655 (!) 151/77     Pulse Rate 02/17/20 1655 91     Resp 02/17/20 1655 18     Temp 02/17/20 1655 98 F (36.7 C)     Temp src --      SpO2 02/17/20 1655 100 %     Weight 02/17/20 1656 230 lb (104.3 kg)     Height 02/17/20 1656 5\' 2"  (1.575 m)     Head Circumference --      Peak Flow --      Pain Score 02/17/20 1656 6     Pain Loc --      Pain Edu? --      Excl. in [place]? --     Constitutional: Alert and oriented. Well appearing and in no acute distress. Eyes: Conjunctivae are normal. EOMI. Head: Atraumatic. Nose: No  congestion/rhinnorhea. Mouth/Throat: Mucous membranes are moist.   Neck: No stridor. Trachea Midline. FROM Cardiovascular: Normal rate, regular rhythm. Grossly normal heart sounds.  Good peripheral circulation. Respiratory: Normal respiratory effort.  No retractions. Lungs CTAB. Gastrointestinal: Soft and nontender. No distention. No abdominal bruits.  Musculoskeletal: No lower extremity tenderness nor edema.  No joint effusions. Neurologic:  Normal speech and language. No gross focal neurologic deficits are appreciated.  Skin:  Skin is warm, dry and intact. No rash noted. Psychiatric: Mood and affect are normal. Speech and behavior are normal. GU: Deferred   ____________________________________________   LABS (all labs ordered are listed, but only  abnormal results are displayed)  Labs Reviewed  COMPREHENSIVE METABOLIC PANEL - Abnormal; Notable for the following components:      Result Value   Glucose, Bld 173 (*)    All other components within normal limits  CBC - Abnormal; Notable for the following components:   MCV 75.8 (*)    MCH 24.0 (*)    RDW 16.8 (*)    All other components within normal limits  LIPASE, BLOOD  URINALYSIS, COMPLETE (UACMP) WITH MICROSCOPIC  TROPONIN I (HIGH SENSITIVITY)  TROPONIN I (HIGH SENSITIVITY)   ____________________________________________   ED ECG REPORT I, Concha Se, the attending physician, personally viewed and interpreted this ECG.  EKG normal sinus rate of 85, no ST elevation, no T wave inversions, normal intervals ____________________________________________  RADIOLOGY Vela Prose, personally viewed and evaluated these images (plain radiographs) as part of my medical decision making, as well as reviewing the written report by the radiologist  ED MD interpretation: No signs of pneumonia  Official radiology report(s): DG Chest 2 View  Result Date: 02/17/2020 CLINICAL DATA:  Cough and left-sided chest pain EXAM: CHEST - 2 VIEW  COMPARISON:  06/22/2019 FINDINGS: Cardiac shadow is mildly enlarged but stable. Aortic calcifications are again seen. The lungs are well aerated with minimal scarring in the bases bilaterally. No sizable effusion or infiltrate is seen. Mild degenerative changes of the thoracic spine are noted. IMPRESSION: No acute abnormality seen. Electronically Signed   By: Alcide Clever M.D.   On: 02/17/2020 19:07    ____________________________________________   PROCEDURES  Procedure(s) performed (including Critical Care):  Procedures   ____________________________________________   INITIAL IMPRESSION / ASSESSMENT AND PLAN / ED COURSE  Betty Luna was evaluated in Emergency Department on 02/17/2020 for the symptoms described in the history of present illness. She was evaluated in the context of the global COVID-19 pandemic, which necessitated consideration that the patient might be at risk for infection with the SARS-CoV-2 virus that causes COVID-19. Institutional protocols and algorithms that pertain to the evaluation of patients at risk for COVID-19 are in a state of rapid change based on information released by regulatory bodies including the CDC and federal and state organizations. These policies and algorithms were followed during the patient's care in the ED.    Patient is a very well-appearing 69 year old slightly hypertensive who comes in with sharp stabbing pains underneath her left breast ongoing to make sure that it was not her heart.  Patient is been in the ER for 2 hours does not have recurrent symptoms.  Patient's initial labs show no signs of pancreatitis, cholecystitis.  At this time her abdomen is soft and nontender I have low suspicion for acute abdominal pathology such as AAA, kidney stone.  Cardiac markers and EKG were not done on patient so we will perform those just to make sure no signs of heart issues.  Will get chest x-ray given the cough to make sure no signs of pleural effusion  or pneumonia.  Cardiac markers negative x2.  Labs are reassuring otherwise.  Chest x-ray is negative for  On reassessment patient continues to be asymptomatic for greater than 3 hours while in the emergency room.  At this time of low suspicion for acute pathology the patient feels comfortable with being discharged home and can follow-up with her primary care doctor.  I discussed the provisional nature of ED diagnosis, the treatment so far, the ongoing plan of care, follow up appointments and return precautions with the patient and  any family or support people present. They expressed understanding and agreed with the plan, discharged home.     ____________________________________________   FINAL CLINICAL IMPRESSION(S) / ED DIAGNOSES   Final diagnoses:  Left upper quadrant abdominal pain      MEDICATIONS GIVEN DURING THIS VISIT:  Medications  sodium chloride flush (NS) 0.9 % injection 3 mL (has no administration in time range)     ED Discharge Orders    None       Note:  This document was prepared using Dragon voice recognition software and may include unintentional dictation errors.   Vanessa Marydel, MD 02/17/20 2131

## 2020-02-17 NOTE — ED Notes (Signed)
Pt states having sharp left side pain. Pt denies fevers, shob, difficulty urinating. Pt does state she has had a little bit of a cough.

## 2020-02-23 ENCOUNTER — Encounter: Payer: Self-pay | Admitting: Emergency Medicine

## 2020-02-23 ENCOUNTER — Other Ambulatory Visit: Payer: Self-pay

## 2020-02-23 ENCOUNTER — Emergency Department
Admission: EM | Admit: 2020-02-23 | Discharge: 2020-02-23 | Disposition: A | Discharge status: Home / Self Care | Payer: Medicare Other | Attending: Emergency Medicine | Admitting: Emergency Medicine

## 2020-02-23 DIAGNOSIS — I509 Heart failure, unspecified: Secondary | ICD-10-CM | POA: Insufficient documentation

## 2020-02-23 DIAGNOSIS — Z7984 Long term (current) use of oral hypoglycemic drugs: Secondary | ICD-10-CM | POA: Insufficient documentation

## 2020-02-23 DIAGNOSIS — Z79899 Other long term (current) drug therapy: Secondary | ICD-10-CM | POA: Diagnosis not present

## 2020-02-23 DIAGNOSIS — I110 Hypertensive heart disease with heart failure: Secondary | ICD-10-CM | POA: Insufficient documentation

## 2020-02-23 DIAGNOSIS — F17290 Nicotine dependence, other tobacco product, uncomplicated: Secondary | ICD-10-CM | POA: Insufficient documentation

## 2020-02-23 DIAGNOSIS — E119 Type 2 diabetes mellitus without complications: Secondary | ICD-10-CM | POA: Diagnosis not present

## 2020-02-23 DIAGNOSIS — I10 Essential (primary) hypertension: Secondary | ICD-10-CM

## 2020-02-23 MED ORDER — CLONIDINE HCL 0.1 MG PO TABS
0.2000 mg | ORAL_TABLET | Freq: Once | ORAL | Status: AC
  Administered 2020-02-23: 0.2 mg via ORAL
  Filled 2020-02-23: qty 2

## 2020-02-23 NOTE — ED Provider Notes (Signed)
[facility] Emergency Department Provider Note ____________________________________________   First MD Initiated Contact with Patient 02/23/20 2151     (approximate)  I have reviewed the triage vital signs and the nursing notes.   HISTORY  Chief Complaint Hypertension    HPI Betty Luna is a 69 y.o. female with PMH as noted below including a history of hypertension, CHF, diabetes who presents with elevated blood pressure over the last week.  The patient states that she was seen in the ED last week, and again at an outpatient appointment yesterday and both times was noted to have high blood pressure.  She is concerned that the medications she is taking are not working.  She is on carvedilol and losartan, and states that she is compliant with both.  Patient denies any severe headache, chest pain, difficulty breathing, weakness or lightheadedness, or other acute symptoms.  Past Medical History:  Diagnosis Date  . Anemia   . Arthritis   . CHF (congestive heart failure) (HCC)   . Depression   . Diabetes mellitus without complication (HCC)   . GERD (gastroesophageal reflux disease)   . Gout   . Hypertension     Patient Active Problem List   Diagnosis Date Noted  . Shortness of breath 06/22/2019  . Uncontrolled hypertension 06/22/2019  . SOB (shortness of breath) 06/22/2019  . Pyelonephritis 06/26/2017    Past Surgical History:  Procedure Laterality Date  . ABDOMINAL HYSTERECTOMY    . CATARACT EXTRACTION W/PHACO Right 04/16/2017   Procedure: CATARACT EXTRACTION PHACO AND INTRAOCULAR LENS PLACEMENT (IOC);  Surgeon: Galen Manila, MD;  Location: [facility] ORS;  Service: Ophthalmology;  Laterality: Right;  [place] 01:06 AP% 16.5 CDE 11.03 Fluid pack lot # [id number]  . CATARACT EXTRACTION W/PHACO Left 10/01/2017   Procedure: CATARACT EXTRACTION PHACO AND INTRAOCULAR LENS PLACEMENT (IOC)-LEFT DIABETIC;  Surgeon: Galen Manila, MD;  Location: [facility] ORS;   Service: Ophthalmology;  Laterality: Left;  [place] 01:05.7 AP% 12.2 CDE 7.42 Fluid pack lot # [id number]  . CTR Left   . TONSILLECTOMY      Prior to Admission medications   Medication Sig Start Date End Date Taking? Authorizing Provider  aspirin EC 81 MG tablet Take 81 mg by mouth daily.    [provider]  carvedilol (COREG) 25 MG tablet Take 25 mg by mouth 2 (two) times daily.  02/14/15   [provider]  colchicine 0.6 MG tablet Take 0.6 mg by mouth daily.    [provider]  escitalopram (LEXAPRO) 20 MG tablet Take 20 mg by mouth daily. 04/09/19 07/08/19  [provider]  famotidine (PEPCID) 40 MG tablet Take 1 tablet (40 mg total) by mouth every evening. 12/09/17 12/09/18  Rebecka Apley, MD  glimepiride (AMARYL) 2 MG tablet Take 2 mg by mouth daily. With breakfast 10/28/18 10/28/19  [provider]  JANUVIA 100 MG tablet Take 100 mg by mouth daily. 03/04/17   [provider]  losartan (COZAAR) 50 MG tablet Take 50 mg by mouth daily.  01/23/15   [provider]  metFORMIN (GLUCOPHAGE-XR) 500 MG 24 hr tablet Take 1,000 mg by mouth 2 (two) times daily.     [provider]  pantoprazole (PROTONIX) 40 MG tablet Take 40 mg by mouth daily. 03/07/17   [provider]  phenazopyridine (PYRIDIUM) 200 MG tablet Take 1 tablet (200 mg total) by mouth 3 (three) times daily as needed for pain. 10/03/19   Joni Reining, PA-C  spironolactone (ALDACTONE)  25 MG tablet Take 25 mg by mouth daily.    [provider]  sulfamethoxazole-trimethoprim (BACTRIM DS) 800-160 MG tablet Take 1 tablet by mouth 2 (two) times daily. 12/24/19   Enid Derry, PA-C  torsemide (DEMADEX) 10 MG tablet Take 10 mg by mouth daily.  03/23/15   [provider]  traMADol (ULTRAM) 50 MG tablet Take 1 tablet (50 mg total) by mouth every 6 (six) hours as needed. 08/15/19   Tommi Rumps, PA-C  traMADol (ULTRAM) 50 MG tablet Take 1 tablet (50 mg  total) by mouth every 6 (six) hours as needed for moderate pain. 10/03/19   Joni Reining, PA-C    Allergies Omeprazole, Atorvastatin, and Codeine  Family History  Problem Relation Age of Onset  . Alcohol abuse Brother     Social History Social History   Tobacco Use  . Smoking status: Former Games developer  . Smokeless tobacco: Current User    Types: Snuff  Substance Use Topics  . Alcohol use: Yes    Comment: occasonally  . Drug use: No    Review of Systems  Constitutional: No fever. Eyes: No visual changes. ENT: No neck pain. Cardiovascular: Denies chest pain. Respiratory: Denies shortness of breath. Gastrointestinal: No vomiting or diarrhea.  Genitourinary: Negative for dysuria.  Musculoskeletal: Negative for back pain. Skin: Negative for rash. Neurological: Negative for headache.   ____________________________________________   PHYSICAL EXAM:  VITAL SIGNS: ED Triage Vitals  Enc Vitals Group     BP 02/23/20 2141 (!) 202/102     Pulse Rate 02/23/20 2141 99     Resp 02/23/20 2141 16     Temp 02/23/20 2141 98.2 F (36.8 C)     Temp Source 02/23/20 2141 Oral     SpO2 02/23/20 2141 98 %     Weight 02/23/20 2142 225 lb (102.1 kg)     Height 02/23/20 2142 5\' 1"  (1.549 m)     Head Circumference --      Peak Flow --      Pain Score 02/23/20 2142 0     Pain Loc --      Pain Edu? --      Excl. in GC? --     Constitutional: Alert and oriented. Well appearing and in no acute distress. Eyes: Conjunctivae are normal.  Head: Atraumatic. Nose: No congestion/rhinnorhea. Mouth/Throat: Mucous membranes are moist.   Neck: Normal range of motion.  Cardiovascular: Good peripheral circulation. Respiratory: Normal respiratory effort.  No retractions.  Gastrointestinal: No distention.  Musculoskeletal: Extremities warm and well perfused.  Neurologic:  Normal speech and language. No gross focal neurologic deficits are appreciated.  Skin:  Skin is warm and dry. No rash  noted. Psychiatric: Mood and affect are normal. Speech and behavior are normal.  ____________________________________________   LABS (all labs ordered are listed, but only abnormal results are displayed)  Labs Reviewed - No data to display ____________________________________________  EKG   ____________________________________________  RADIOLOGY    ____________________________________________   PROCEDURES  Procedure(s) performed: No  Procedures  Critical Care performed: No ____________________________________________   INITIAL IMPRESSION / ASSESSMENT AND PLAN / ED COURSE  Pertinent labs & imaging results that were available during my care of the patient were reviewed by me and considered in my medical decision making (see chart for details).  69 year old female with a history of hypertension and other PMH as noted above presents with elevated blood pressures on several readings over the last week.  She reports that she is compliant  with her carvedilol and losartan, and has the pill bottles here with her.  She denies any acute symptoms.  I reviewed the past medical records in epic.  The patient was last seen in the ED on 4/28 for abdominal pain, which has since resolved, and her blood pressure was 151/77 at that time.  When she was previously seen in March, the blood pressure was similar.  The patient states that she was taking over-the-counter pain medication for joint pains, which she ordered on the Internet.  She does not know the name of it, but is concerned it could be contributing.  However, she states that she stopped taking it last week.  On exam the patient is well-appearing.  Her vital signs are normal except for a blood pressure of 202/102.  The physical exam is otherwise unremarkable.  At this time, the patient has no signs or symptoms of endorgan dysfunction, and there is no indication for medical work-up.  I will give a dose of clonidine to help bring down  the blood pressure now, and will have her contact her PMD to discuss possibly changing the dose of her usual medications.  ----------------------------------------- 11:30 PM on 02/23/2020 -----------------------------------------  The blood pressure has improved.  The patient is stable for discharge home.  I instructed her to contact her PMD tomorrow to discuss whether she needs any changes to her regimen.  Return precautions given, and she expresses understanding.  ____________________________________________   FINAL CLINICAL IMPRESSION(S) / ED DIAGNOSES  Final diagnoses:  Hypertension, unspecified type      NEW MEDICATIONS STARTED DURING THIS VISIT:  New Prescriptions   No medications on file     Note:  This document was prepared using Dragon voice recognition software and may include unintentional dictation errors.    Arta Silence, MD 02/23/20 2330

## 2020-02-23 NOTE — Discharge Instructions (Addendum)
Continue to take your blood pressure medications as prescribed.  Call your primary care doctor tomorrow to discuss whether you should go up on either of your blood pressure medications.  Return to the ER for new, worsening, or persistent severe high blood pressure, severe headache, chest pain, shortness of breath, or any other new or worsening symptoms that concern you.

## 2020-02-23 NOTE — ED Triage Notes (Signed)
Pt to ED from home c/o high blood pressure.  States taking BP medicine but not sure if it's working.  Denies other complaints.  A&Ox4, chest rise even and unlabored, in NAD at this time.

## 2020-03-25 ENCOUNTER — Other Ambulatory Visit: Payer: Self-pay

## 2020-03-25 ENCOUNTER — Emergency Department: Payer: Medicare Other

## 2020-03-25 ENCOUNTER — Emergency Department
Admission: EM | Admit: 2020-03-25 | Discharge: 2020-03-25 | Disposition: A | Discharge status: Home / Self Care | Payer: Medicare Other | Attending: Emergency Medicine | Admitting: Emergency Medicine

## 2020-03-25 DIAGNOSIS — M25571 Pain in right ankle and joints of right foot: Secondary | ICD-10-CM

## 2020-03-25 DIAGNOSIS — E119 Type 2 diabetes mellitus without complications: Secondary | ICD-10-CM | POA: Diagnosis not present

## 2020-03-25 DIAGNOSIS — M25471 Effusion, right ankle: Secondary | ICD-10-CM | POA: Diagnosis not present

## 2020-03-25 DIAGNOSIS — N3000 Acute cystitis without hematuria: Secondary | ICD-10-CM | POA: Diagnosis not present

## 2020-03-25 DIAGNOSIS — I110 Hypertensive heart disease with heart failure: Secondary | ICD-10-CM | POA: Diagnosis not present

## 2020-03-25 DIAGNOSIS — Z7982 Long term (current) use of aspirin: Secondary | ICD-10-CM | POA: Insufficient documentation

## 2020-03-25 DIAGNOSIS — Z79899 Other long term (current) drug therapy: Secondary | ICD-10-CM | POA: Diagnosis not present

## 2020-03-25 DIAGNOSIS — F17220 Nicotine dependence, chewing tobacco, uncomplicated: Secondary | ICD-10-CM | POA: Insufficient documentation

## 2020-03-25 DIAGNOSIS — Z7984 Long term (current) use of oral hypoglycemic drugs: Secondary | ICD-10-CM | POA: Diagnosis not present

## 2020-03-25 DIAGNOSIS — I509 Heart failure, unspecified: Secondary | ICD-10-CM | POA: Diagnosis not present

## 2020-03-25 LAB — URINALYSIS, COMPLETE (UACMP) WITH MICROSCOPIC
Bilirubin Urine: NEGATIVE
Glucose, UA: NEGATIVE mg/dL
Hgb urine dipstick: NEGATIVE
Ketones, ur: NEGATIVE mg/dL
Nitrite: POSITIVE — AB
Protein, ur: NEGATIVE mg/dL
Specific Gravity, Urine: 1.013 (ref 1.005–1.030)
WBC, UA: >50 WBC/hpf — ABNORMAL HIGH (ref 0–5)
pH: 7 (ref 5.0–8.0)

## 2020-03-25 MED ORDER — CEPHALEXIN 500 MG PO CAPS: 500.0000 mg | ORAL_CAPSULE | Freq: Three times a day (TID) | ORAL | 0 refills | Status: AC

## 2020-03-25 MED ORDER — CEPHALEXIN 500 MG PO CAPS
500.0000 mg | ORAL_CAPSULE | Freq: Once | ORAL | Status: AC
  Administered 2020-03-25: 500 mg via ORAL
  Filled 2020-03-25: qty 1

## 2020-03-25 MED ORDER — MELOXICAM 7.5 MG PO TABS
7.5000 mg | ORAL_TABLET | Freq: Once | ORAL | Status: AC
  Administered 2020-03-25: 7.5 mg via ORAL
  Filled 2020-03-25: qty 1

## 2020-03-25 MED ORDER — MELOXICAM 7.5 MG PO TABS
7.5000 mg | ORAL_TABLET | Freq: Every day | ORAL | 0 refills | Status: AC
Start: 2020-03-25 — End: 2020-04-01

## 2020-03-25 NOTE — ED Notes (Signed)
Pt from home with right ankle pain x 2 days; states she has gout and the medicine isn't helping. Pt also reports strong smell to urine, denies frequency or pain/burning x 4 days. Pt also c/o chronic back pain. Pt alert & oriented; nad noted.

## 2020-03-25 NOTE — Discharge Instructions (Addendum)
Your urine shows that you have a urinary tract infection.  Please begin Keflex for urinary tract infection.  Your x-ray shows swelling to your ankle and swelling inside the joint of your ankle.  Please wear ankle splint to your right ankle.  Use walker and limit weightbearing on white right ankle.  You can take Mobic for your ankle.  You can also continue your gout medications that primary care prescribe you.  Please call primary care this afternoon for a follow-up appointment on Monday.

## 2020-03-25 NOTE — ED Provider Notes (Signed)
[facility] Emergency Department Provider Note  ____________________________________________  Time seen: Approximately 10:44 AM  I have reviewed the triage vital signs and the nursing notes.   HISTORY  Chief Complaint Ankle Pain    HPI Betty Luna is a 69 y.o. female that presents to the emergency department for evaluation of right ankle pain and swelling for several and a foul odor to your urine for 2 days.  No trauma to ankle.  Ankle pain is primarily on the front and inside of her ankle.  Patient denies any dysuria, urgency, frequency but states that her urine has a foul odor. This feels similar to her previous urinary tract infections. Patient states that she was seen by primary care and put on colchicine and allopurinol for gout.  No shortness of breath, chest pain, vomiting, abdominal pain   Past Medical History:  Diagnosis Date  . Anemia   . Arthritis   . CHF (congestive heart failure) (HCC)   . Depression   . Diabetes mellitus without complication (HCC)   . GERD (gastroesophageal reflux disease)   . Gout   . Hypertension     Patient Active Problem List   Diagnosis Date Noted  . Shortness of breath 06/22/2019  . Uncontrolled hypertension 06/22/2019  . SOB (shortness of breath) 06/22/2019  . Pyelonephritis 06/26/2017    Past Surgical History:  Procedure Laterality Date  . ABDOMINAL HYSTERECTOMY    . CATARACT EXTRACTION W/PHACO Right 04/16/2017   Procedure: CATARACT EXTRACTION PHACO AND INTRAOCULAR LENS PLACEMENT (IOC);  Surgeon: Galen Manila, MD;  Location: [facility] ORS;  Service: Ophthalmology;  Laterality: Right;  [place] 01:06 AP% 16.5 CDE 11.03 Fluid pack lot # [id number]  . CATARACT EXTRACTION W/PHACO Left 10/01/2017   Procedure: CATARACT EXTRACTION PHACO AND INTRAOCULAR LENS PLACEMENT (IOC)-LEFT DIABETIC;  Surgeon: Galen Manila, MD;  Location: [facility] ORS;  Service: Ophthalmology;  Laterality: Left;  [place] 01:05.7 AP% 12.2 CDE  7.42 Fluid pack lot # [id number]  . CTR Left   . TONSILLECTOMY      Prior to Admission medications   Medication Sig Start Date End Date Taking? Authorizing Provider  aspirin EC 81 MG tablet Take 81 mg by mouth daily.    [provider]  carvedilol (COREG) 25 MG tablet Take 25 mg by mouth 2 (two) times daily.  02/14/15   [provider]  cephALEXin (KEFLEX) 500 MG capsule Take 1 capsule (500 mg total) by mouth 3 (three) times daily for 10 days. 03/25/20 04/04/20  Enid Derry, PA-C  colchicine 0.6 MG tablet Take 0.6 mg by mouth daily.    [provider]  escitalopram (LEXAPRO) 20 MG tablet Take 20 mg by mouth daily. 04/09/19 07/08/19  [provider]  famotidine (PEPCID) 40 MG tablet Take 1 tablet (40 mg total) by mouth every evening. 12/09/17 12/09/18  Rebecka Apley, MD  glimepiride (AMARYL) 2 MG tablet Take 2 mg by mouth daily. With breakfast 10/28/18 10/28/19  [provider]  JANUVIA 100 MG tablet Take 100 mg by mouth daily. 03/04/17   [provider]  losartan (COZAAR) 50 MG tablet Take 50 mg by mouth daily.  01/23/15   [provider]  meloxicam (MOBIC) 7.5 MG tablet Take 1 tablet (7.5 mg total) by mouth daily for 7 days. 03/25/20 04/01/20  Enid Derry, PA-C  metFORMIN (GLUCOPHAGE-XR) 500 MG 24 hr tablet Take 1,000 mg by mouth 2 (two) times daily.     [provider]  pantoprazole (PROTONIX) 40 MG tablet Take  40 mg by mouth daily. 03/07/17   [provider]  phenazopyridine (PYRIDIUM) 200 MG tablet Take 1 tablet (200 mg total) by mouth 3 (three) times daily as needed for pain. 10/03/19   Joni Reining, PA-C  spironolactone (ALDACTONE) 25 MG tablet Take 25 mg by mouth daily.    [provider]  sulfamethoxazole-trimethoprim (BACTRIM DS) 800-160 MG tablet Take 1 tablet by mouth 2 (two) times daily. 12/24/19   Enid Derry, PA-C  torsemide (DEMADEX) 10 MG tablet Take 10 mg by mouth daily.  03/23/15   [provider]  traMADol (ULTRAM) 50 MG tablet Take 1 tablet (50 mg total) by mouth every 6 (six) hours as needed. 08/15/19   Tommi Rumps, PA-C  traMADol (ULTRAM) 50 MG tablet Take 1 tablet (50 mg total) by mouth every 6 (six) hours as needed for moderate pain. 10/03/19   Joni Reining, PA-C    Allergies Omeprazole, Atorvastatin, and Codeine  Family History  Problem Relation Age of Onset  . Alcohol abuse Brother     Social History Social History   Tobacco Use  . Smoking status: Former Games developer  . Smokeless tobacco: Current User    Types: Snuff  Substance Use Topics  . Alcohol use: Yes    Comment: occasonally  . Drug use: No     Review of Systems  Cardiovascular: No chest pain. Respiratory: No SOB. Gastrointestinal: No abdominal pain.  No nausea, no vomiting.  Genitourinary: Negative for dysuria, urgency, frequency. Musculoskeletal: Positive for ankle pain. Skin: Negative for rash, abrasions, lacerations, ecchymosis. Neurological: Negative for headaches, numbness or tingling   ____________________________________________   PHYSICAL EXAM:  VITAL SIGNS: ED Triage Vitals  Enc Vitals Group     BP 03/25/20 0924 (!) 147/79     Pulse Rate 03/25/20 0924 92     Resp 03/25/20 0924 18     Temp 03/25/20 0924 99.3 F (37.4 C)     Temp Source 03/25/20 0924 Oral     SpO2 03/25/20 0924 96 %     Weight 03/25/20 0925 227 lb (103 kg)     Height 03/25/20 0925 5' (1.524 m)     Head Circumference --      Peak Flow --      Pain Score 03/25/20 0925 9     Pain Loc --      Pain Edu? --      Excl. in GC? --      Constitutional: Alert and oriented. Well appearing and in no acute distress. Eyes: Conjunctivae are normal. PERRL. EOMI. Head: Atraumatic. ENT:      Ears:      Nose: No congestion/rhinnorhea.      Mouth/Throat: Mucous membranes are moist.  Neck: No stridor.   Cardiovascular: Normal rate, regular rhythm.  Good peripheral circulation. Respiratory: Normal  respiratory effort without tachypnea or retractions. Lungs CTAB. Good air entry to the bases with no decreased or absent breath sounds. Gastrointestinal: Bowel sounds 4 quadrants. Soft and nontender to palpation. No guarding or rigidity. No palpable masses. No distention.  Musculoskeletal: Full range of motion to all extremities. No gross deformities appreciated.  Swelling to lateral and medial malleolus.  No overlying erythema.  Joint is not hot to touch.  No pitting edema.  No foot swelling.  No calf pain. Neurologic:  Normal speech and language. No gross focal neurologic deficits are appreciated.  Skin:  Skin is warm, dry and intact. No rash noted. Psychiatric: Mood and affect are normal.  Speech and behavior are normal. Patient exhibits appropriate insight and judgement.   ____________________________________________   LABS (all labs ordered are listed, but only abnormal results are displayed)  Labs Reviewed  URINALYSIS, COMPLETE (UACMP) WITH MICROSCOPIC - Abnormal; Notable for the following components:      Result Value   Color, Urine YELLOW (*)    APPearance CLOUDY (*)    Nitrite POSITIVE (*)    Leukocytes,Ua LARGE (*)    WBC, UA >50 (*)    Bacteria, UA RARE (*)    All other components within normal limits   ____________________________________________  EKG   ____________________________________________  RADIOLOGY Robinette Haines, personally viewed and evaluated these images (plain radiographs) as part of my medical decision making, as well as reviewing the written report by the radiologist.   DG Ankle Complete Right  Result Date: 03/25/2020 CLINICAL DATA:  Ankle pain and swelling for 2 days.  History of gout EXAM: RIGHT ANKLE - COMPLETE 3+ VIEW COMPARISON:  01/11/2015 FINDINGS: Generalized soft tissue swelling with ankle joint effusion. No acute fracture or erosion. Enthesophytes including at the heel. IMPRESSION: Soft tissue swelling and ankle joint effusion without acute  osseous finding. Electronically Signed   By: Monte Fantasia M.D.   On: 03/25/2020 11:15    ____________________________________________    PROCEDURES  Procedure(s) performed:    Procedures    Medications  cephALEXin (KEFLEX) capsule 500 mg (500 mg Oral Given 03/25/20 1224)  meloxicam (MOBIC) tablet 7.5 mg (7.5 mg Oral Given 03/25/20 1224)     ____________________________________________   INITIAL IMPRESSION / ASSESSMENT AND PLAN / ED COURSE  Pertinent labs & imaging results that were available during my care of the patient were reviewed by me and considered in my medical decision making (see chart for details).  Review of the [place] CSRS was performed in accordance of the [place] prior to dispensing any controlled drugs.    Patient presented to emergency department for evaluation of right ankle pain and swelling and concern for urinary tract infection. Vital signs and exam are reassuring. Urinalysis consistent with urinary tract infection. Patient was given a dose of Keflex for infection. Ankle x-ray shows some soft tissue swelling and a joint effusion. Ankle is painful to touch and with ROM. Symptoms may be due to gout or arthritis. Patient does not have any additional swelling to her leg or foot, pitting edema, no shortness of breath or chest pain to suggest CHF. No signs of bacterial infection. Patient will continue her gout medications that primary care has placed her on. Ankle was Ace wrapped. She was given a walker to limit weightbearing to ankle. Patient will be discharged home with prescriptions for Mobic and Keflex. Patient is to follow up with primary care as directed. Patient is given ED precautions to return to the ED for any worsening or new symptoms.   Betty Luna was evaluated in Emergency Department on 03/25/2020 for the symptoms described in the history of present illness. She was evaluated in the context of the global COVID-19 pandemic, which necessitated consideration  that the patient might be at risk for infection with the SARS-CoV-2 virus that causes COVID-19. Institutional protocols and algorithms that pertain to the evaluation of patients at risk for COVID-19 are in a state of rapid change based on information released by regulatory bodies including the CDC and federal and state organizations. These policies and algorithms were followed during the patient's care in the ED.  ____________________________________________  FINAL CLINICAL IMPRESSION(S) / ED DIAGNOSES  Final  diagnoses:  Effusion of right ankle  Acute right ankle pain  Acute cystitis without hematuria      NEW MEDICATIONS STARTED DURING THIS VISIT:  ED Discharge Orders         Ordered    cephALEXin (KEFLEX) 500 MG capsule  3 times daily     03/25/20 1219    meloxicam (MOBIC) 7.5 MG tablet  Daily     03/25/20 1219              This chart was dictated using voice recognition software/Dragon. Despite best efforts to proofread, errors can occur which can change the meaning. Any change was purely unintentional.    Enid Derry, PA-C 03/25/20 1529    Dionne Bucy, MD 03/25/20 [phone]

## 2020-03-25 NOTE — ED Triage Notes (Signed)
Pt states that she has been having worse gout pain in the right ankle since Tuesday despite taking her medications, some swelling noted. Pt also states that she has had a foul smell to her urine for the past 2-3 weeks, denies pain with urination

## 2020-03-27 LAB — URINE CULTURE: Culture: >=100000 — AB

## 2020-03-31 ENCOUNTER — Emergency Department: Payer: Medicare Other

## 2020-03-31 ENCOUNTER — Encounter: Payer: Self-pay | Admitting: Emergency Medicine

## 2020-03-31 ENCOUNTER — Other Ambulatory Visit: Payer: Self-pay

## 2020-03-31 ENCOUNTER — Emergency Department
Admission: EM | Admit: 2020-03-31 | Discharge: 2020-03-31 | Disposition: A | Discharge status: Home / Self Care | Payer: Medicare Other | Attending: Emergency Medicine | Admitting: Emergency Medicine

## 2020-03-31 DIAGNOSIS — I509 Heart failure, unspecified: Secondary | ICD-10-CM | POA: Diagnosis not present

## 2020-03-31 DIAGNOSIS — F17228 Nicotine dependence, chewing tobacco, with other nicotine-induced disorders: Secondary | ICD-10-CM | POA: Diagnosis not present

## 2020-03-31 DIAGNOSIS — Z20822 Contact with and (suspected) exposure to covid-19: Secondary | ICD-10-CM | POA: Insufficient documentation

## 2020-03-31 DIAGNOSIS — E119 Type 2 diabetes mellitus without complications: Secondary | ICD-10-CM | POA: Insufficient documentation

## 2020-03-31 DIAGNOSIS — I110 Hypertensive heart disease with heart failure: Secondary | ICD-10-CM | POA: Insufficient documentation

## 2020-03-31 DIAGNOSIS — Z7984 Long term (current) use of oral hypoglycemic drugs: Secondary | ICD-10-CM | POA: Diagnosis not present

## 2020-03-31 DIAGNOSIS — R0602 Shortness of breath: Secondary | ICD-10-CM | POA: Diagnosis present

## 2020-03-31 DIAGNOSIS — R0789 Other chest pain: Secondary | ICD-10-CM | POA: Insufficient documentation

## 2020-03-31 DIAGNOSIS — Z79899 Other long term (current) drug therapy: Secondary | ICD-10-CM | POA: Insufficient documentation

## 2020-03-31 LAB — BASIC METABOLIC PANEL
Anion gap: 9 (ref 5–15)
BUN: 12 mg/dL (ref 8–23)
CO2: 29 mmol/L (ref 22–32)
Calcium: 9.2 mg/dL (ref 8.9–10.3)
Chloride: 102 mmol/L (ref 98–111)
Creatinine, Ser: 0.74 mg/dL (ref 0.44–1.00)
GFR calc Af Amer: >60 mL/min (ref >60)
GFR calc non Af Amer: >60 mL/min (ref >60)
Glucose, Bld: 185 mg/dL — ABNORMAL HIGH (ref 70–99)
Potassium: 3.9 mmol/L (ref 3.5–5.1)
Sodium: 140 mmol/L (ref 135–145)

## 2020-03-31 LAB — CBC
HCT: 36.7 % (ref 36.0–46.0)
Hemoglobin: 11.5 g/dL — ABNORMAL LOW (ref 12.0–15.0)
MCH: 23.9 pg — ABNORMAL LOW (ref 26.0–34.0)
MCHC: 31.3 g/dL (ref 30.0–36.0)
MCV: 76.1 fL — ABNORMAL LOW (ref 80.0–100.0)
Platelets: 316 10*3/uL (ref 150–400)
RBC: 4.82 MIL/uL (ref 3.87–5.11)
RDW: 16.1 % — ABNORMAL HIGH (ref 11.5–15.5)
WBC: 7.9 10*3/uL (ref 4.0–10.5)
nRBC: 0 % (ref 0.0–0.2)

## 2020-03-31 LAB — BRAIN NATRIURETIC PEPTIDE: B Natriuretic Peptide: 422.5 pg/mL — ABNORMAL HIGH (ref 0.0–100.0)

## 2020-03-31 LAB — TROPONIN I (HIGH SENSITIVITY): Troponin I (High Sensitivity): 11 ng/L (ref <18)

## 2020-03-31 LAB — SARS CORONAVIRUS 2 BY RT PCR (HOSPITAL ORDER, PERFORMED IN ~~LOC~~ HOSPITAL LAB): SARS Coronavirus 2: NEGATIVE

## 2020-03-31 MED ORDER — FUROSEMIDE 10 MG/ML IJ SOLN
20.0000 mg | Freq: Once | INTRAMUSCULAR | Status: AC
  Administered 2020-03-31: 20 mg via INTRAVENOUS
  Filled 2020-03-31: qty 4

## 2020-03-31 NOTE — ED Notes (Signed)
EDP Malinda at bedside.  ?

## 2020-03-31 NOTE — ED Provider Notes (Signed)
[facility] Emergency Department Provider Note   ____________________________________________   First MD Initiated Contact with Patient 03/31/20 [phone]     (approximate)  I have reviewed the triage vital signs and the nursing notes.   HISTORY  Chief Complaint Shortness of Breath and Cough    HPI Betty Luna is a 69 y.o. female who reports cough starting 2 weeks ago and now is short of breath.  She gets shortness of breath when she walks.  It is getting worse slowly.  She is now having some chest tightness when she walks.  She did not have any chest pain or tightness earlier.  She does have a known history of CHF.  She has had her first Covid vaccination supposed to get the second 1 today.        Past Medical History:  Diagnosis Date  . Anemia   . Arthritis   . CHF (congestive heart failure) (HCC)   . Depression   . Diabetes mellitus without complication (HCC)   . GERD (gastroesophageal reflux disease)   . Gout   . Hypertension     Patient Active Problem List   Diagnosis Date Noted  . Shortness of breath 06/22/2019  . Uncontrolled hypertension 06/22/2019  . SOB (shortness of breath) 06/22/2019  . Pyelonephritis 06/26/2017    Past Surgical History:  Procedure Laterality Date  . ABDOMINAL HYSTERECTOMY    . CATARACT EXTRACTION W/PHACO Right 04/16/2017   Procedure: CATARACT EXTRACTION PHACO AND INTRAOCULAR LENS PLACEMENT (IOC);  Surgeon: Galen Manila, MD;  Location: [facility] ORS;  Service: Ophthalmology;  Laterality: Right;  [place] 01:06 AP% 16.5 CDE 11.03 Fluid pack lot # [id number]  . CATARACT EXTRACTION W/PHACO Left 10/01/2017   Procedure: CATARACT EXTRACTION PHACO AND INTRAOCULAR LENS PLACEMENT (IOC)-LEFT DIABETIC;  Surgeon: Galen Manila, MD;  Location: [facility] ORS;  Service: Ophthalmology;  Laterality: Left;  [place] 01:05.7 AP% 12.2 CDE 7.42 Fluid pack lot # [id number]  . CTR Left   . TONSILLECTOMY      Prior to Admission medications     Medication Sig Start Date End Date Taking? Authorizing Provider  allopurinol (ZYLOPRIM) 100 MG tablet Take 100 mg by mouth daily. 03/01/20  Yes [provider]  aspirin EC 81 MG tablet Take 81 mg by mouth daily.   Yes [provider]  carvedilol (COREG) 25 MG tablet Take 25 mg by mouth 2 (two) times daily.  02/14/15  Yes [provider]  cephALEXin (KEFLEX) 500 MG capsule Take 1 capsule (500 mg total) by mouth 3 (three) times daily for 10 days. 03/25/20 04/04/20 Yes Enid Derry, PA-C  colchicine 0.6 MG tablet Take 0.6 mg by mouth daily.   Yes [provider]  DULoxetine (CYMBALTA) 60 MG capsule Take 60 mg by mouth daily. 03/01/20  Yes [provider]  losartan (COZAAR) 50 MG tablet Take 50 mg by mouth daily.  01/23/15  Yes [provider]  meloxicam (MOBIC) 7.5 MG tablet Take 1 tablet (7.5 mg total) by mouth daily for 7 days. 03/25/20 04/01/20 Yes Enid Derry, PA-C  metFORMIN (GLUCOPHAGE-XR) 500 MG 24 hr tablet Take 1,000 mg by mouth 2 (two) times daily.    Yes [provider]  pantoprazole (PROTONIX) 40 MG tablet Take 40 mg by mouth daily. 03/07/17  Yes [provider]  torsemide (DEMADEX) 10 MG tablet Take 10 mg by mouth daily.  03/23/15  Yes [provider]  Vitamin D, Ergocalciferol, (DRISDOL) 1.25 MG (50000 UNIT) CAPS capsule Take 50,000 Units  by mouth once a week. 02/25/20  Yes [provider]    Allergies Omeprazole, Tramadol, Atorvastatin, and Codeine  Family History  Problem Relation Age of Onset  . Alcohol abuse Brother     Social History Social History   Tobacco Use  . Smoking status: Former Games developer  . Smokeless tobacco: Current User    Types: Snuff  Vaping Use  . Vaping Use: Never used  Substance Use Topics  . Alcohol use: Yes    Comment: occasonally  . Drug use: No    Review of Systems  Constitutional: No fever/chills Eyes: No visual changes. ENT: No sore throat. Cardiovascular:  Denies chest pain. Respiratory:  shortness of breath. Gastrointestinal: No abdominal pain.  No nausea, no vomiting.  No diarrhea.  No constipation. Genitourinary: Negative for dysuria. Musculoskeletal: Negative for back pain. Skin: Negative for rash. Neurological: Negative for headaches, focal weakness   ____________________________________________   PHYSICAL EXAM:  VITAL SIGNS: ED Triage Vitals  Enc Vitals Group     BP 03/31/20 0709 (!) 184/96     Pulse Rate 03/31/20 0709 (!) 102     Resp 03/31/20 0709 (!) 22     Temp 03/31/20 0709 98.7 F (37.1 C)     Temp Source 03/31/20 0709 Oral     SpO2 03/31/20 0709 94 %     Weight 03/31/20 0710 224 lb 13.9 oz (102 kg)     Height 03/31/20 0710 5' (1.524 m)     Head Circumference --      Peak Flow --      Pain Score 03/31/20 0710 0     Pain Loc --      Pain Edu? --      Excl. in GC? --     Constitutional: Alert and oriented. Well appearing and in no acute distress. Eyes: Conjunctivae are normal. PER EOMI. Head: Atraumatic. Nose: No congestion/rhinnorhea. Mouth/Throat: Mucous membranes are moist.  Oropharynx non-erythematous. Neck: No stridor.  Cardiovascular: Normal rate, regular rhythm. Grossly normal heart sounds.  Good peripheral circulation. Respiratory: Normal respiratory effort.  No retractions. Lungs CTAB! Gastrointestinal: Soft and nontender. No distention. No abdominal bruits. No CVA tenderness. }Musculoskeletal: No lower extremity tenderness nor edema.   Neurologic:  Normal speech and language. No gross focal neurologic deficits are appreciated.  Skin:  Skin is warm, dry and intact. No rash noted.   ____________________________________________   LABS (all labs ordered are listed, but only abnormal results are displayed)  Labs Reviewed  BASIC METABOLIC PANEL - Abnormal; Notable for the following components:      Result Value   Glucose, Bld 185 (*)    All other components within normal limits  CBC - Abnormal;  Notable for the following components:   Hemoglobin 11.5 (*)    MCV 76.1 (*)    MCH 23.9 (*)    RDW 16.1 (*)    All other components within normal limits  BRAIN NATRIURETIC PEPTIDE - Abnormal; Notable for the following components:   B Natriuretic Peptide 422.5 (*)    All other components within normal limits  SARS CORONAVIRUS 2 BY RT PCR (HOSPITAL ORDER, PERFORMED IN [facility])  TROPONIN I (HIGH SENSITIVITY)  TROPONIN I (HIGH SENSITIVITY)   ____________________________________________  EKG  EKG read interpreted by me shows normal sinus rhythm rate of 97 0 axis slightly widened QRS no acute ST-T wave changes compared to EKG from a couple months back. ____________________________________________  RADIOLOGY  ED MD interpretation: Chest x-ray read by radiology  reviewed by me is negative  Official radiology report(s): DG Chest 2 View  Result Date: 03/31/2020 CLINICAL DATA:  Cough and shortness of breath EXAM: CHEST - 2 VIEW COMPARISON:  February 17, 2020 FINDINGS: There is diffuse interstitial pulmonary edema. There is no consolidation. Heart is borderline enlarged with pulmonary venous hypertension. No adenopathy. There is aortic atherosclerosis. There is degenerative change in the thoracic spine. IMPRESSION: Interstitial edema with mild cardiomegaly and pulmonary vascular congestion. Appearance indicative of congestive heart failure. No consolidation. Aortic Atherosclerosis (ICD10-I70.0). Electronically Signed   By: Lowella Grip III M.D.   On: 03/31/2020 07:59    ____________________________________________   PROCEDURES  Procedure(s) performed (including Critical Care):  Procedures   ____________________________________________   INITIAL IMPRESSION / ASSESSMENT AND PLAN / ED COURSE Patient gets 20 Lasix in the emergency room puts out about 2 L of fluid.  She feels much better and is able to walk without desaturating past 93 or 94.  She reports that pain in her  ribs when she coughs started after she had been coughing for a while.  She will take her torsemide this afternoon at home and take a double dose tomorrow.  I will have her follow-up with her cardiologist echo noted clinic in the next couple days for recheck.  She will return for any further increasing shortness of breath chest tightness weakness or any other problems.               ____________________________________________   FINAL CLINICAL IMPRESSION(S) / ED DIAGNOSES  Final diagnoses:  Acute on chronic congestive heart failure, unspecified heart failure type [facility])     ED Discharge Orders    None       Note:  This document was prepared using Dragon voice recognition software and may include unintentional dictation errors.    Nena Polio, MD 03/31/20 1125

## 2020-03-31 NOTE — Discharge Instructions (Addendum)
Please take your torsemide this afternoon and then take 2 doses of it 20 mg that would be tomorrow.  Follow-up with your cardiologist, Dr. Lady Gary, in the next couple days.  Please call him when you get home today or in the morning tomorrow and make an appointment to see him.  Please return if you are worse.  Worse would include shortness of breath or chest tightness or heaviness or just feeling weak or sick.

## 2020-03-31 NOTE — ED Notes (Signed)
Pt able to ambulate independently with a steady gait. Sat 93%-98% RA while ambulating. pt denies CP/SHOB.  Pt back in bed safely.

## 2020-03-31 NOTE — ED Triage Notes (Signed)
Patient reports worsening cough and SOB since Tuesday. States she thinks she may have bronchitis. Also reports pain in sides with coughing.

## 2020-06-18 ENCOUNTER — Other Ambulatory Visit: Payer: Self-pay

## 2020-06-18 ENCOUNTER — Emergency Department: Payer: Medicare Other

## 2020-06-18 ENCOUNTER — Emergency Department
Admission: EM | Admit: 2020-06-18 | Discharge: 2020-06-18 | Disposition: A | Discharge status: Home / Self Care | Payer: Medicare Other | Attending: Emergency Medicine | Admitting: Emergency Medicine

## 2020-06-18 DIAGNOSIS — Z87891 Personal history of nicotine dependence: Secondary | ICD-10-CM | POA: Insufficient documentation

## 2020-06-18 DIAGNOSIS — S161XXA Strain of muscle, fascia and tendon at neck level, initial encounter: Secondary | ICD-10-CM | POA: Insufficient documentation

## 2020-06-18 DIAGNOSIS — Z79899 Other long term (current) drug therapy: Secondary | ICD-10-CM | POA: Insufficient documentation

## 2020-06-18 DIAGNOSIS — I110 Hypertensive heart disease with heart failure: Secondary | ICD-10-CM | POA: Insufficient documentation

## 2020-06-18 DIAGNOSIS — W19XXXA Unspecified fall, initial encounter: Secondary | ICD-10-CM

## 2020-06-18 DIAGNOSIS — W010XXA Fall on same level from slipping, tripping and stumbling without subsequent striking against object, initial encounter: Secondary | ICD-10-CM | POA: Diagnosis not present

## 2020-06-18 DIAGNOSIS — Y999 Unspecified external cause status: Secondary | ICD-10-CM | POA: Diagnosis not present

## 2020-06-18 DIAGNOSIS — Y929 Unspecified place or not applicable: Secondary | ICD-10-CM | POA: Diagnosis not present

## 2020-06-18 DIAGNOSIS — Z7984 Long term (current) use of oral hypoglycemic drugs: Secondary | ICD-10-CM | POA: Diagnosis not present

## 2020-06-18 DIAGNOSIS — E119 Type 2 diabetes mellitus without complications: Secondary | ICD-10-CM | POA: Insufficient documentation

## 2020-06-18 DIAGNOSIS — S0083XA Contusion of other part of head, initial encounter: Secondary | ICD-10-CM

## 2020-06-18 DIAGNOSIS — I509 Heart failure, unspecified: Secondary | ICD-10-CM | POA: Diagnosis not present

## 2020-06-18 DIAGNOSIS — S0093XA Contusion of unspecified part of head, initial encounter: Secondary | ICD-10-CM | POA: Diagnosis not present

## 2020-06-18 DIAGNOSIS — Z7982 Long term (current) use of aspirin: Secondary | ICD-10-CM | POA: Insufficient documentation

## 2020-06-18 DIAGNOSIS — Y939 Activity, unspecified: Secondary | ICD-10-CM | POA: Diagnosis not present

## 2020-06-18 DIAGNOSIS — S0990XA Unspecified injury of head, initial encounter: Secondary | ICD-10-CM | POA: Diagnosis present

## 2020-06-18 MED ORDER — METHOCARBAMOL 500 MG PO TABS
500.0000 mg | ORAL_TABLET | Freq: Once | ORAL | Status: AC
  Administered 2020-06-18: 500 mg via ORAL
  Filled 2020-06-18: qty 1

## 2020-06-18 MED ORDER — PREDNISONE 50 MG PO TABS
50.0000 mg | ORAL_TABLET | Freq: Every day | ORAL | 0 refills | Status: DC
Start: 2020-06-18 — End: 2021-04-03

## 2020-06-18 MED ORDER — PREDNISONE 20 MG PO TABS
60.0000 mg | ORAL_TABLET | Freq: Once | ORAL | Status: AC
  Administered 2020-06-18: 60 mg via ORAL
  Filled 2020-06-18: qty 3

## 2020-06-18 MED ORDER — ONDANSETRON 4 MG PO TBDP
4.0000 mg | ORAL_TABLET | Freq: Three times a day (TID) | ORAL | 0 refills | Status: DC | PRN
Start: 2020-06-18 — End: 2021-04-03

## 2020-06-18 MED ORDER — TRAMADOL HCL 50 MG PO TABS
50.0000 mg | ORAL_TABLET | Freq: Once | ORAL | Status: AC
  Administered 2020-06-18: 50 mg via ORAL
  Filled 2020-06-18: qty 1

## 2020-06-18 MED ORDER — ONDANSETRON 8 MG PO TBDP
8.0000 mg | ORAL_TABLET | Freq: Once | ORAL | Status: AC
  Administered 2020-06-18: 8 mg via ORAL
  Filled 2020-06-18: qty 1

## 2020-06-18 MED ORDER — TRAMADOL HCL 50 MG PO TABS: 50.0000 mg | ORAL_TABLET | Freq: Four times a day (QID) | ORAL | 0 refills | Status: DC | PRN

## 2020-06-18 MED ORDER — METHOCARBAMOL 500 MG PO TABS: 500.0000 mg | ORAL_TABLET | Freq: Four times a day (QID) | ORAL | 0 refills | Status: DC

## 2020-06-18 NOTE — ED Notes (Signed)
Patient assisted to the bathroom 

## 2020-06-18 NOTE — ED Notes (Signed)
See triage note Presents s/p fall while helping room mate  Having pain to neck and back

## 2020-06-18 NOTE — ED Provider Notes (Signed)
[facility] Emergency Department Provider Note  ____________________________________________  Time seen: Approximately 1:19 PM  I have reviewed the triage vital signs and the nursing notes.   HISTORY  Chief Complaint Fall    HPI Betty Luna is a 69 y.o. female who presents the emergency department complaining of headache, diffuse spine pain after a fall. Patient reportedly was sitting in a chair and attempted to get out and fell striking the right side of her head. She did not lose consciousness. She is endorsing a headache, neck and diffuse back pain. No radicular symptoms in the upper or lower extremities. No bowel or bladder dysfunction, saddle anesthesia or paresthesias. Medical history as described below with no complaints of chronic medical issues.         Past Medical History:  Diagnosis Date  . Anemia   . Arthritis   . CHF (congestive heart failure) (HCC)   . Depression   . Diabetes mellitus without complication (HCC)   . GERD (gastroesophageal reflux disease)   . Gout   . Hypertension     Patient Active Problem List   Diagnosis Date Noted  . Shortness of breath 06/22/2019  . Uncontrolled hypertension 06/22/2019  . SOB (shortness of breath) 06/22/2019  . Pyelonephritis 06/26/2017    Past Surgical History:  Procedure Laterality Date  . ABDOMINAL HYSTERECTOMY    . CATARACT EXTRACTION W/PHACO Right 04/16/2017   Procedure: CATARACT EXTRACTION PHACO AND INTRAOCULAR LENS PLACEMENT (IOC);  Surgeon: Galen Manila, MD;  Location: [facility] ORS;  Service: Ophthalmology;  Laterality: Right;  [place] 01:06 AP% 16.5 CDE 11.03 Fluid pack lot # [id number]  . CATARACT EXTRACTION W/PHACO Left 10/01/2017   Procedure: CATARACT EXTRACTION PHACO AND INTRAOCULAR LENS PLACEMENT (IOC)-LEFT DIABETIC;  Surgeon: Galen Manila, MD;  Location: [facility] ORS;  Service: Ophthalmology;  Laterality: Left;  [place] 01:05.7 AP% 12.2 CDE 7.42 Fluid pack lot # [id number]  . CTR  Left   . TONSILLECTOMY      Prior to Admission medications   Medication Sig Start Date End Date Taking? Authorizing Provider  allopurinol (ZYLOPRIM) 100 MG tablet Take 100 mg by mouth daily. 03/01/20   [provider]  aspirin EC 81 MG tablet Take 81 mg by mouth daily.    [provider]  carvedilol (COREG) 25 MG tablet Take 25 mg by mouth 2 (two) times daily.  02/14/15   [provider]  colchicine 0.6 MG tablet Take 0.6 mg by mouth daily.    [provider]  DULoxetine (CYMBALTA) 60 MG capsule Take 60 mg by mouth daily. 03/01/20   [provider]  losartan (COZAAR) 50 MG tablet Take 50 mg by mouth daily.  01/23/15   [provider]  metFORMIN (GLUCOPHAGE-XR) 500 MG 24 hr tablet Take 1,000 mg by mouth 2 (two) times daily.     [provider]  methocarbamol (ROBAXIN) 500 MG tablet Take 1 tablet (500 mg total) by mouth 4 (four) times daily. 06/18/20   Maelie Chriswell, Delorise Royals, PA-C  ondansetron (ZOFRAN-ODT) 4 MG disintegrating tablet Take 1 tablet (4 mg total) by mouth every 8 (eight) hours as needed for nausea or vomiting. 06/18/20   Namine Beahm, Delorise Royals, PA-C  pantoprazole (PROTONIX) 40 MG tablet Take 40 mg by mouth daily. 03/07/17   [provider]  predniSONE (DELTASONE) 50 MG tablet Take 1 tablet (50 mg total) by mouth daily with breakfast. 06/18/20   Niaya Hickok, Delorise Royals, PA-C  torsemide (DEMADEX) 10 MG tablet Take 10 mg by mouth daily.  03/23/15   [provider]  traMADol (ULTRAM) 50 MG tablet Take 1 tablet (50 mg total) by mouth every 6 (six) hours as needed. 06/18/20   Ethyn Schetter, Delorise Royals, PA-C  Vitamin D, Ergocalciferol, (DRISDOL) 1.25 MG (50000 UNIT) CAPS capsule Take 50,000 Units by mouth once a week. 02/25/20   [provider]    Allergies Omeprazole, Tramadol, Atorvastatin, and Codeine  Family History  Problem Relation Age of Onset  . Alcohol abuse Brother     Social History Social History    Tobacco Use  . Smoking status: Former Games developer  . Smokeless tobacco: Current User    Types: Snuff  Vaping Use  . Vaping Use: Never used  Substance Use Topics  . Alcohol use: Yes    Comment: occasonally  . Drug use: No     Review of Systems  Constitutional: No fever/chills Eyes: No visual changes. No discharge ENT: No upper respiratory complaints. Cardiovascular: no chest pain. Respiratory: no cough. No SOB. Gastrointestinal: No abdominal pain.  No nausea, no vomiting.  No diarrhea.  No constipation. Musculoskeletal: Diffuse neck and back pain Skin: Negative for rash, abrasions, lacerations, ecchymosis. Neurological: Negative for headaches, focal weakness or numbness. 10-point ROS otherwise negative.  ____________________________________________   PHYSICAL EXAM:  VITAL SIGNS: ED Triage Vitals  Enc Vitals Group     BP 06/18/20 0252 108/68     Pulse Rate 06/18/20 0252 97     Resp 06/18/20 0252 20     Temp 06/18/20 0252 97.8 F (36.6 C)     Temp Source 06/18/20 0252 Oral     SpO2 06/18/20 0252 97 %     Weight 06/18/20 0251 230 lb (104.3 kg)     Height 06/18/20 0251 5\' 2"  (1.575 m)     Head Circumference --      Peak Flow --      Pain Score 06/18/20 0251 10     Pain Loc --      Pain Edu? --      Excl. in GC? --      Constitutional: Alert and oriented. Well appearing and in no acute distress. Eyes: Conjunctivae are normal. PERRL. EOMI. Head: Atraumatic. ENT:      Ears:       Nose: No congestion/rhinnorhea.      Mouth/Throat: Mucous membranes are moist.  Neck: No stridor.  Diffuse midline and bilateral cervical spine tenderness to palpation.  No palpable abnormality or step-off.  Radial pulses sensation intact and equal bilateral upper extremities.  Cardiovascular: Normal rate, regular rhythm. Normal S1 and S2.  Good peripheral circulation. Respiratory: Normal respiratory effort without tachypnea or retractions. Lungs CTAB. Good air entry to the bases with no  decreased or absent breath sounds. Musculoskeletal: Full range of motion to all extremities. No gross deformities appreciated.  No visible signs of trauma to the mid or lower back.  Diffuse mild tenderness without point specific tenderness and no palpable abnormality.  No step-off.  No tenderness over bilateral sciatic notches.  Negative straight leg raise bilaterally.  Dorsalis pedis pulses sensation intact and equal bilateral lower extremities. Neurologic:  Normal speech and language. No gross focal neurologic deficits are appreciated.  Skin:  Skin is warm, dry and intact. No rash noted. Psychiatric: Mood and affect are normal. Speech and behavior are normal. Patient exhibits appropriate insight and judgement.   ____________________________________________   LABS (all labs ordered are listed, but only abnormal results are displayed)  Labs Reviewed - No data to display ____________________________________________  EKG   ____________________________________________  RADIOLOGY I personally viewed and evaluated these images as part of my medical decision making, as well as reviewing the written report by the radiologist.  DG Thoracic Spine 2 View  Result Date: 06/18/2020 CLINICAL DATA:  Fall with back pain EXAM: THORACIC SPINE 2 VIEWS COMPARISON:  08/17/2018 FINDINGS: Thoracic alignment within normal limits. Vertebral body heights are maintained. Diffuse spondylosis of the thoracic spine. IMPRESSION: No acute osseous abnormality. Electronically Signed   By: Jasmine Pang M.D.   On: 06/18/2020 03:33   DG Lumbar Spine Complete  Result Date: 06/18/2020 CLINICAL DATA:  Fall with back pain EXAM: LUMBAR SPINE - COMPLETE 4+ VIEW COMPARISON:  CT 06/22/2019 FINDINGS: Lumbar alignment within normal limits. Vertebral body heights are maintained. Mild degenerative changes at L4-L5 and L5-S1. Prominent facet degenerative change of the lower lumbar spine. Aortic atherosclerosis. IMPRESSION: No acute  osseous abnormality. Electronically Signed   By: Jasmine Pang M.D.   On: 06/18/2020 03:34   CT Head Wo Contrast  Result Date: 06/18/2020 CLINICAL DATA:  Larey Seat and hit head EXAM: CT HEAD WITHOUT CONTRAST CT CERVICAL SPINE WITHOUT CONTRAST TECHNIQUE: Multidetector CT imaging of the head and cervical spine was performed following the standard protocol without intravenous contrast. Multiplanar CT image reconstructions of the cervical spine were also generated. COMPARISON:  CT brain and cervical spine 08/17/2018 FINDINGS: CT HEAD FINDINGS Brain: No evidence of acute infarction, hemorrhage, hydrocephalus, extra-axial collection or mass lesion/mass effect. Vascular: No hyperdense vessel or unexpected calcification. Skull: Normal. Negative for fracture or focal lesion. Sinuses/Orbits: Mucous retention cyst in the right maxillary sinus. Mucosal thickening in the ethmoid sinuses and frontal sinuses Other: None. CT CERVICAL SPINE FINDINGS Alignment: No subluxation.  Facet alignment is normal Skull base and vertebrae: No acute fracture. No primary bone lesion or focal pathologic process. Soft tissues and spinal canal: No prevertebral fluid or swelling. No visible canal hematoma. Disc levels: Flowing anterior osteophytes throughout the cervical spine. Mild disc space narrowing C7-T1. Remaining disc spaces are preserved. Upper chest: Massively enlarged thyroid gland with multiple nodules presumably due to goiter. This is unchanged. Other: None IMPRESSION: 1. Negative non contrasted CT appearance of the brain. 2. Degenerative changes of the cervical spine. No acute osseous abnormality. Electronically Signed   By: Jasmine Pang M.D.   On: 06/18/2020 04:16   CT Cervical Spine Wo Contrast  Result Date: 06/18/2020 CLINICAL DATA:  Larey Seat and hit head EXAM: CT HEAD WITHOUT CONTRAST CT CERVICAL SPINE WITHOUT CONTRAST TECHNIQUE: Multidetector CT imaging of the head and cervical spine was performed following the standard protocol  without intravenous contrast. Multiplanar CT image reconstructions of the cervical spine were also generated. COMPARISON:  CT brain and cervical spine 08/17/2018 FINDINGS: CT HEAD FINDINGS Brain: No evidence of acute infarction, hemorrhage, hydrocephalus, extra-axial collection or mass lesion/mass effect. Vascular: No hyperdense vessel or unexpected calcification. Skull: Normal. Negative for fracture or focal lesion. Sinuses/Orbits: Mucous retention cyst in the right maxillary sinus. Mucosal thickening in the ethmoid sinuses and frontal sinuses Other: None. CT CERVICAL SPINE FINDINGS Alignment: No subluxation.  Facet alignment is normal Skull base and vertebrae: No acute fracture. No primary bone lesion or focal pathologic process. Soft tissues and spinal canal: No prevertebral fluid or swelling. No visible canal hematoma. Disc levels: Flowing anterior osteophytes throughout the cervical spine. Mild disc space narrowing C7-T1. Remaining disc spaces are preserved. Upper chest: Massively enlarged thyroid gland with multiple nodules presumably due to goiter. This is unchanged. Other: None IMPRESSION: 1. Negative  non contrasted CT appearance of the brain. 2. Degenerative changes of the cervical spine. No acute osseous abnormality. Electronically Signed   By: Jasmine Pang M.D.   On: 06/18/2020 04:16    ____________________________________________    PROCEDURES  Procedure(s) performed:    Procedures    Medications  methocarbamol (ROBAXIN) tablet 500 mg (has no administration in time range)  traMADol (ULTRAM) tablet 50 mg (has no administration in time range)  ondansetron (ZOFRAN-ODT) disintegrating tablet 8 mg (has no administration in time range)  predniSONE (DELTASONE) tablet 60 mg (has no administration in time range)     ____________________________________________   INITIAL IMPRESSION / ASSESSMENT AND PLAN / ED COURSE  Pertinent labs & imaging results that were available during my care  of the patient were reviewed by me and considered in my medical decision making (see chart for details).  Review of the [place] CSRS was performed in accordance of the [facility] prior to dispensing any controlled drugs.           Patient's diagnosis is consistent with fall, contusion of the forehead and strain cervical spine.  Patient presented to emergency department after sustaining a fall last night.  No loss of consciousness.  Exam was reassuring.  Imaging reveals no acute traumatic findings.  Patient stable for discharge.  Medication for further work-up.  Patient may prescribe Ultram, prednisone, Robaxin for symptom relief.  Follow-up primary care as needed..  Patient is given ED precautions to return to the ED for any worsening or new symptoms.     ____________________________________________  FINAL CLINICAL IMPRESSION(S) / ED DIAGNOSES  Final diagnoses:  Fall, initial encounter  Contusion of forehead, initial encounter  Strain of neck muscle, initial encounter      NEW MEDICATIONS STARTED DURING THIS VISIT:  ED Discharge Orders         Ordered    traMADol (ULTRAM) 50 MG tablet  Every 6 hours PRN        06/18/20 1328    ondansetron (ZOFRAN-ODT) 4 MG disintegrating tablet  Every 8 hours PRN        06/18/20 1328    predniSONE (DELTASONE) 50 MG tablet  Daily with breakfast        06/18/20 1328    methocarbamol (ROBAXIN) 500 MG tablet  4 times daily        06/18/20 1328              This chart was dictated using voice recognition software/Dragon. Despite best efforts to proofread, errors can occur which can change the meaning. Any change was purely unintentional.    Racheal Patches, PA-C 06/18/20 1331    Delton [place], MD 06/18/20 [phone]

## 2020-06-18 NOTE — ED Notes (Signed)
Pt states she takes HTN medication at home; she has not taken it today and will take it as soon as she gets home.

## 2020-06-18 NOTE — ED Notes (Signed)
Pt assisted to the bathroom at this time.

## 2020-06-18 NOTE — ED Triage Notes (Signed)
Patient reports was helping roommate and fell into the floor, hit head (denies loss of consciousness or pain to head) reports neck pain and back pain (reports entire spine).

## 2020-06-18 NOTE — ED Notes (Signed)
Pt assisted to restroom.  

## 2020-07-12 ENCOUNTER — Emergency Department: Payer: Medicare Other

## 2020-07-12 ENCOUNTER — Encounter: Payer: Self-pay | Admitting: Emergency Medicine

## 2020-07-12 ENCOUNTER — Other Ambulatory Visit: Payer: Self-pay

## 2020-07-12 ENCOUNTER — Emergency Department
Admission: EM | Admit: 2020-07-12 | Discharge: 2020-07-13 | Disposition: A | Discharge status: Home / Self Care | Payer: Medicare Other | Attending: Emergency Medicine | Admitting: Emergency Medicine

## 2020-07-12 DIAGNOSIS — R0789 Other chest pain: Secondary | ICD-10-CM | POA: Diagnosis not present

## 2020-07-12 DIAGNOSIS — K219 Gastro-esophageal reflux disease without esophagitis: Secondary | ICD-10-CM | POA: Insufficient documentation

## 2020-07-12 DIAGNOSIS — M1990 Unspecified osteoarthritis, unspecified site: Secondary | ICD-10-CM | POA: Diagnosis not present

## 2020-07-12 DIAGNOSIS — M25561 Pain in right knee: Secondary | ICD-10-CM | POA: Insufficient documentation

## 2020-07-12 DIAGNOSIS — F17290 Nicotine dependence, other tobacco product, uncomplicated: Secondary | ICD-10-CM | POA: Insufficient documentation

## 2020-07-12 DIAGNOSIS — M109 Gout, unspecified: Secondary | ICD-10-CM | POA: Diagnosis not present

## 2020-07-12 DIAGNOSIS — M545 Low back pain, unspecified: Secondary | ICD-10-CM

## 2020-07-12 DIAGNOSIS — I509 Heart failure, unspecified: Secondary | ICD-10-CM | POA: Diagnosis not present

## 2020-07-12 DIAGNOSIS — Z7984 Long term (current) use of oral hypoglycemic drugs: Secondary | ICD-10-CM | POA: Insufficient documentation

## 2020-07-12 DIAGNOSIS — Z79899 Other long term (current) drug therapy: Secondary | ICD-10-CM | POA: Insufficient documentation

## 2020-07-12 DIAGNOSIS — W19XXXD Unspecified fall, subsequent encounter: Secondary | ICD-10-CM | POA: Insufficient documentation

## 2020-07-12 DIAGNOSIS — I110 Hypertensive heart disease with heart failure: Secondary | ICD-10-CM | POA: Insufficient documentation

## 2020-07-12 DIAGNOSIS — E119 Type 2 diabetes mellitus without complications: Secondary | ICD-10-CM | POA: Insufficient documentation

## 2020-07-12 DIAGNOSIS — M48061 Spinal stenosis, lumbar region without neurogenic claudication: Secondary | ICD-10-CM | POA: Diagnosis not present

## 2020-07-12 DIAGNOSIS — G8929 Other chronic pain: Secondary | ICD-10-CM

## 2020-07-12 LAB — COMPREHENSIVE METABOLIC PANEL
ALT: 16 U/L (ref 0–44)
AST: 20 U/L (ref 15–41)
Albumin: 3.6 g/dL (ref 3.5–5.0)
Alkaline Phosphatase: 100 U/L (ref 38–126)
Anion gap: 14 (ref 5–15)
BUN: 11 mg/dL (ref 8–23)
CO2: 26 mmol/L (ref 22–32)
Calcium: 9.1 mg/dL (ref 8.9–10.3)
Chloride: 103 mmol/L (ref 98–111)
Creatinine, Ser: 0.69 mg/dL (ref 0.44–1.00)
GFR calc Af Amer: >60 mL/min (ref >60)
GFR calc non Af Amer: >60 mL/min (ref >60)
Glucose, Bld: 146 mg/dL — ABNORMAL HIGH (ref 70–99)
Potassium: 3.7 mmol/L (ref 3.5–5.1)
Sodium: 143 mmol/L (ref 135–145)
Total Bilirubin: 0.6 mg/dL (ref 0.3–1.2)
Total Protein: 7.3 g/dL (ref 6.5–8.1)

## 2020-07-12 LAB — LIPASE, BLOOD: Lipase: 26 U/L (ref 11–51)

## 2020-07-12 LAB — CBC
HCT: 37.8 % (ref 36.0–46.0)
Hemoglobin: 12.3 g/dL (ref 12.0–15.0)
MCH: 24.2 pg — ABNORMAL LOW (ref 26.0–34.0)
MCHC: 32.5 g/dL (ref 30.0–36.0)
MCV: 74.3 fL — ABNORMAL LOW (ref 80.0–100.0)
Platelets: 250 10*3/uL (ref 150–400)
RBC: 5.09 MIL/uL (ref 3.87–5.11)
RDW: 16.9 % — ABNORMAL HIGH (ref 11.5–15.5)
WBC: 8.5 10*3/uL (ref 4.0–10.5)
nRBC: 0 % (ref 0.0–0.2)

## 2020-07-12 LAB — URINALYSIS, COMPLETE (UACMP) WITH MICROSCOPIC
Bilirubin Urine: NEGATIVE
Glucose, UA: NEGATIVE mg/dL
Hgb urine dipstick: NEGATIVE
Ketones, ur: NEGATIVE mg/dL
Nitrite: NEGATIVE
Protein, ur: NEGATIVE mg/dL
Specific Gravity, Urine: 1.013 (ref 1.005–1.030)
pH: 5 (ref 5.0–8.0)

## 2020-07-12 LAB — TROPONIN I (HIGH SENSITIVITY)
Troponin I (High Sensitivity): 10 ng/L (ref <18)
Troponin I (High Sensitivity): 11 ng/L (ref <18)

## 2020-07-12 NOTE — ED Notes (Signed)
Pt states that she came in because she had a fall last month and that her knee is still bothering her and they didn't xray her knee when she came. Pt states she is not having abdominal pain, but that it occasionally feels like she has to go to the bathroom.

## 2020-07-12 NOTE — ED Triage Notes (Signed)
Pt arrived via POV with reports of back pain, epigastric pain and right knee pain.  Pt states she fell a month ago and continues to have back pain.  Pt reports the epigastric pain is not new, and she has an appt with heart doctor this week.   Pt also has right knee pain x 2 weeks and states the pain is getting worse.

## 2020-07-13 ENCOUNTER — Emergency Department: Payer: Medicare Other

## 2020-07-13 LAB — URINE CULTURE: Culture: <10000 — AB

## 2020-07-13 MED ORDER — FOSFOMYCIN TROMETHAMINE 3 G PO PACK
3.0000 g | PACK | Freq: Once | ORAL | Status: AC
  Administered 2020-07-13: 3 g via ORAL
  Filled 2020-07-13: qty 3

## 2020-07-13 NOTE — Discharge Instructions (Addendum)
Your workup in the Emergency Department today was reassuring.  We did not find any specific abnormalities.  We recommend you drink plenty of fluids, take your regular medications and/or any new ones prescribed today, and follow up with the doctor(s) listed in these documents as recommended.  Return to the Emergency Department if you develop new or worsening symptoms that concern you.  

## 2020-07-13 NOTE — ED Provider Notes (Signed)
[facility] Emergency Department Provider Note  ____________________________________________   First MD Initiated Contact with Patient 07/12/20 2331     (approximate)  I have reviewed the triage vital signs and the nursing notes.   HISTORY  Chief Complaint Abdominal Pain, Back Pain, and Knee Pain    HPI Betty Luna is a 69 y.o. female with extensive chronic medical issues including chronic back pain, chronic knee pain, and epigastric pain.  She presents tonight with a variety of complaints related to these chronic conditions, but her main issue that is bothering her tonight is her knee pain.  She said that it is not new, but that it has been worsening over an extended period of time, at least for a couple of months.  She was told in the past that she has gout but she does not think it is gout.  Is not swollen or warm, just hurts particular when she bears weight or walks around.  She describes it as dull aching pain that is present on both sides of the knee, worse with weightbearing and ambulation, better with rest.  She saw a doctor recently and was prescribed indomethacin but says she is only taken a dose of it.  She thought she should  get it checked out.  She also has problems with her lower back, and said that her pain has been worse since she had a fall more than a month ago for which she was fully evaluated, but again the pain is not new or different.  She also said that her back pain does not radiate down her leg.  She is very specific that the knee pain starts at the knee and sometimes feels like it radiates down the leg but she does not have pain in her right hip nor along the back of her right thigh.  She has heard of sciatica but said that this does not feel consistent with the description of sciatica.  She also reports that she has some burning pain in her epigastrium that has also been present for weeks to months.  She takes some reflux medicine but  said it does not help.  She has an appointment with her doctor in a couple of days to talk about it.  She is not worried about it tonight.  She denies chest pain or shortness of breath.  She denies nausea and vomiting.  She denies recent fever/chills, sore throat, and dysuria.        Past Medical History:  Diagnosis Date  . Anemia   . Arthritis   . CHF (congestive heart failure) (HCC)   . Depression   . Diabetes mellitus without complication (HCC)   . GERD (gastroesophageal reflux disease)   . Gout   . Hypertension     Patient Active Problem List   Diagnosis Date Noted  . Shortness of breath 06/22/2019  . Uncontrolled hypertension 06/22/2019  . SOB (shortness of breath) 06/22/2019  . Pyelonephritis 06/26/2017    Past Surgical History:  Procedure Laterality Date  . ABDOMINAL HYSTERECTOMY    . CATARACT EXTRACTION W/PHACO Right 04/16/2017   Procedure: CATARACT EXTRACTION PHACO AND INTRAOCULAR LENS PLACEMENT (IOC);  Surgeon: Galen Manila, MD;  Location: [facility] ORS;  Service: Ophthalmology;  Laterality: Right;  [place] 01:06 AP% 16.5 CDE 11.03 Fluid pack lot # [id number]  . CATARACT EXTRACTION W/PHACO Left 10/01/2017   Procedure: CATARACT EXTRACTION PHACO AND INTRAOCULAR LENS PLACEMENT (IOC)-LEFT DIABETIC;  Surgeon: Galen Manila, MD;  Location: [facility] ORS;  Service: Ophthalmology;  Laterality: Left;  [place] 01:05.7 AP% 12.2 CDE 7.42 Fluid pack lot # [id number]  . CTR Left   . TONSILLECTOMY      Prior to Admission medications   Medication Sig Start Date End Date Taking? Authorizing Provider  allopurinol (ZYLOPRIM) 100 MG tablet Take 100 mg by mouth daily. 03/01/20   [provider]  aspirin EC 81 MG tablet Take 81 mg by mouth daily.    [provider]  carvedilol (COREG) 25 MG tablet Take 25 mg by mouth 2 (two) times daily.  02/14/15   [provider]  colchicine 0.6 MG tablet Take 0.6 mg by mouth daily.    [provider]  DULoxetine (CYMBALTA) 60  MG capsule Take 60 mg by mouth daily. 03/01/20   [provider]  losartan (COZAAR) 50 MG tablet Take 50 mg by mouth daily.  01/23/15   [provider]  metFORMIN (GLUCOPHAGE-XR) 500 MG 24 hr tablet Take 1,000 mg by mouth 2 (two) times daily.     [provider]  methocarbamol (ROBAXIN) 500 MG tablet Take 1 tablet (500 mg total) by mouth 4 (four) times daily. 06/18/20   Cuthriell, Delorise Royals, PA-C  ondansetron (ZOFRAN-ODT) 4 MG disintegrating tablet Take 1 tablet (4 mg total) by mouth every 8 (eight) hours as needed for nausea or vomiting. 06/18/20   Cuthriell, Delorise Royals, PA-C  pantoprazole (PROTONIX) 40 MG tablet Take 40 mg by mouth daily. 03/07/17   [provider]  predniSONE (DELTASONE) 50 MG tablet Take 1 tablet (50 mg total) by mouth daily with breakfast. 06/18/20   Cuthriell, Delorise Royals, PA-C  torsemide (DEMADEX) 10 MG tablet Take 10 mg by mouth daily.  03/23/15   [provider]  traMADol (ULTRAM) 50 MG tablet Take 1 tablet (50 mg total) by mouth every 6 (six) hours as needed. 06/18/20   Cuthriell, Delorise Royals, PA-C  Vitamin D, Ergocalciferol, (DRISDOL) 1.25 MG (50000 UNIT) CAPS capsule Take 50,000 Units by mouth once a week. 02/25/20   [provider]    Allergies Omeprazole, Tramadol, Atorvastatin, and Codeine  Family History  Problem Relation Age of Onset  . Alcohol abuse Brother     Social History Social History   Tobacco Use  . Smoking status: Former Games developer  . Smokeless tobacco: Current User    Types: Snuff  Vaping Use  . Vaping Use: Never used  Substance Use Topics  . Alcohol use: Yes    Comment: occasonally  . Drug use: No    Review of Systems Constitutional: No fever/chills Eyes: No visual changes. ENT: No sore throat. Cardiovascular: Denies chest pain. Respiratory: Denies shortness of breath. Gastrointestinal: Burning epigastric pain, chronic.  No nausea, no vomiting.  No diarrhea.  No constipation. Genitourinary:  Negative for dysuria. Musculoskeletal: Acute on chronic pain in her lower back, nonradiating.  Acute on chronic pain in her right knee, no recent traumatic history. Integumentary: Negative for rash.  No swelling or redness in the right knee. Neurological: Negative for headaches, focal weakness or numbness.   ____________________________________________   PHYSICAL EXAM:  VITAL SIGNS: ED Triage Vitals  Enc Vitals Group     BP 07/12/20 1833 (!) 148/73     Pulse Rate 07/12/20 1833 89     Resp 07/12/20 1833 18     Temp 07/12/20 1833 98.2 F (36.8 C)     Temp Source 07/12/20 1833 Oral     SpO2 07/12/20 1833 97 %     Weight  07/12/20 1832 102.5 kg (226 lb)     Height 07/12/20 1832 1.575 m (5\' 2" )     Head Circumference --      Peak Flow --      Pain Score 07/12/20 1838 8     Pain Loc --      Pain Edu? --      Excl. in GC? --     Constitutional: Alert and oriented.  Eyes: Conjunctivae are normal.  Head: Atraumatic. Nose: No congestion/rhinnorhea. Mouth/Throat: Patient is wearing a mask. Neck: No stridor.  No meningeal signs.   Cardiovascular: Normal rate, regular rhythm. Good peripheral circulation. Grossly normal heart sounds. Respiratory: Normal respiratory effort.  No retractions. Gastrointestinal: Obese.  Soft and nontender. No distention.  Musculoskeletal: No lower extremity tenderness nor edema. No gross deformities of extremities.  She has no swelling, redness, or other deformity of her right knee.  She is able to flex and extend it and bear weight even though it hurts to do so.  There is no laxity of the joint.  She does not have pain with straight leg raise and she is very specific that the pain is isolated to her knee, not radiating from her lower back.  She has no point tenderness to palpation of her lumbar spine. Neurologic:  Normal speech and language. No gross focal neurologic deficits are appreciated.  Skin:  Skin is warm, dry and intact. Psychiatric: Mood and affect  are normal. Speech and behavior are normal.  ____________________________________________   LABS (all labs ordered are listed, but only abnormal results are displayed)  Labs Reviewed  COMPREHENSIVE METABOLIC PANEL - Abnormal; Notable for the following components:      Result Value   Glucose, Bld 146 (*)    All other components within normal limits  CBC - Abnormal; Notable for the following components:   MCV 74.3 (*)    MCH 24.2 (*)    RDW 16.9 (*)    All other components within normal limits  URINALYSIS, COMPLETE (UACMP) WITH MICROSCOPIC - Abnormal; Notable for the following components:   Color, Urine YELLOW (*)    APPearance HAZY (*)    Leukocytes,Ua SMALL (*)    Bacteria, UA MANY (*)    All other components within normal limits  URINE CULTURE  LIPASE, BLOOD  TROPONIN I (HIGH SENSITIVITY)  TROPONIN I (HIGH SENSITIVITY)   ____________________________________________  EKG  ED ECG REPORT I, 07/14/20, the attending physician, personally viewed and interpreted this ECG.  Date: 07/12/2020 EKG Time: 18: 35 Rate: 89 Rhythm: normal sinus rhythm QRS Axis: normal Intervals: LVH.  Normal ST/T Wave abnormalities: Non-specific ST segment / T-wave changes, but no clear evidence of acute ischemia. Narrative Interpretation: no definitive evidence of acute ischemia; does not meet STEMI criteria.   ____________________________________________  RADIOLOGY I, 07/14/2020, personally viewed and evaluated these images (plain radiographs) as part of my medical decision making, as well as reviewing the written report by the radiologist.  ED MD interpretation: No acute abnormalities identified on chest x-ray, lumbar spine x-rays, nor right knee x-rays.  Official radiology report(s): DG Chest 2 View  Result Date: 07/12/2020 CLINICAL DATA:  Fall 1 month ago with chest pain, initial encounter EXAM: CHEST - 2 VIEW COMPARISON:  03/31/2020 FINDINGS: Cardiac shadow is within normal limits.  Aortic calcifications are again seen. Previously seen airspace opacity has resolved in the interval. No focal infiltrate or sizable effusion is seen. Degenerative changes of the thoracic spine are noted. No acute compression deformity  is seen. No rib abnormality is noted. IMPRESSION: No acute abnormality seen. Electronically Signed   By: Alcide Clever M.D.   On: 07/12/2020 19:16   DG Lumbar Spine 2-3 Views  Result Date: 07/13/2020 CLINICAL DATA:  Recent fall with increasing back pain, initial encounter EXAM: LUMBAR SPINE - 3 VIEW COMPARISON:  06/18/2020 FINDINGS: Five lumbar type vertebral bodies are well visualized. Vertebral body height is well maintained. Anterolisthesis of L5 on S1 is again noted stable from the prior exam. Mild osteophytic changes and facet hypertrophic changes are noted. Stable nonobstructing left renal stone is seen. IMPRESSION: Degenerative change stable from the prior exam. Electronically Signed   By: Alcide Clever M.D.   On: 07/13/2020 00:35   DG Knee 2 Views Right  Result Date: 07/13/2020 CLINICAL DATA:  Recent fall with right knee pain, initial encounter EXAM: RIGHT KNEE - 2 VIEW COMPARISON:  None. FINDINGS: Degenerative changes of the knee joint are noted in all 3 joint compartments. No joint effusion is seen. No fracture is noted. No soft tissue changes are seen. IMPRESSION: Degenerative change without acute abnormality. Electronically Signed   By: Alcide Clever M.D.   On: 07/13/2020 00:36    ____________________________________________   PROCEDURES   Procedure(s) performed (including Critical Care):  Procedures   ____________________________________________   INITIAL IMPRESSION / MDM / ASSESSMENT AND PLAN / ED COURSE  As part of my medical decision making, I reviewed the following data within the electronic MEDICAL RECORD NUMBER Nursing notes reviewed and incorporated, Labs reviewed , EKG interpreted , Old EKG reviewed, Old chart reviewed, Radiograph reviewed ,  Notes from prior ED visits and [place] Controlled Substance Database   Differential diagnosis includes, but is not limited to, acute on chronic pain, musculoskeletal strain, arthritis of the knee, persistent or worsening stenosis of the lumbar spine, acid reflux.  An emergent medical condition such as ACS is possible but unlikely.  I personally reviewed the x-rays of the patient's back, lumbar spine, and knee, and I agree with the radiology interpretation that there are no acute abnormalities such as fractures or dislocations.  She has degenerative changes but there is stable from prior exams.  Lab results are all reassuring.  She has 2 reassuring troponins, normal lipase, normal comprehensive metabolic panel, normal CBC.  She has some leukocytes and bacteria seen in her urine and this could be asymptomatic bacteriuria and a female with advanced age, but given her history of prior episodes of pyelonephritis, I will treat with a single dose of fosfomycin and I have ordered a urine culture.  I think that should be adequate treatment for asymptomatic bacteriuria but it is reasonable to treat but I think she would benefit more from a single dose of fosfomycin then from an extended course of Keflex.  We talked about the chronic nature of her symptoms and the reassuring fact that she has no emergent findings such as neurological deficits, suggestion of cauda equina syndrome or spinal infection, etc.  Her pain seems to be very specific to the knee and even though I reviewed the medical record I see that she has a history of lumbar stenosis as identified on an MRI from August 2020, she is not having symptoms consistent with nerve impingement, sciatica, etc.  She was just recently prescribed indomethacin and I encouraged her to try conservative measures such as resting the knee, pressure dressing, heating pad, and follow-up with orthopedics as well as using the medicine she was prescribed.  She understands and agrees.  There is no evidence that she is suffering from an emergent condition tonight that would require additional work-up or inpatient hospital stay and she will follow up with her doctor later this week as already scheduled.  I gave my usual and customary return precautions.         ____________________________________________  FINAL CLINICAL IMPRESSION(S) / ED DIAGNOSES  Final diagnoses:  Chronic pain of right knee  Chronic bilateral low back pain without sciatica  Gastroesophageal reflux disease, unspecified whether esophagitis present     MEDICATIONS GIVEN DURING THIS VISIT:  Medications  fosfomycin (MONUROL) packet 3 g (3 g Oral Given 07/13/20 0024)     ED Discharge Orders    None      *Please note:  Betty Luna was evaluated in Emergency Department on 07/13/2020 for the symptoms described in the history of present illness. She was evaluated in the context of the global COVID-19 pandemic, which necessitated consideration that the patient might be at risk for infection with the SARS-CoV-2 virus that causes COVID-19. Institutional protocols and algorithms that pertain to the evaluation of patients at risk for COVID-19 are in a state of rapid change based on information released by regulatory bodies including the CDC and federal and state organizations. These policies and algorithms were followed during the patient's care in the ED.  Some ED evaluations and interventions may be delayed as a result of limited staffing during and after the pandemic.*  Note:  This document was prepared using Dragon voice recognition software and may include unintentional dictation errors.   Loleta Rose, MD 07/13/20 [phone]

## 2020-07-13 NOTE — ED Notes (Signed)
Pt transported to xray 

## 2020-08-30 ENCOUNTER — Other Ambulatory Visit: Payer: Self-pay

## 2020-08-30 ENCOUNTER — Emergency Department: Payer: Medicare Other

## 2020-08-30 ENCOUNTER — Encounter: Payer: Self-pay | Admitting: *Deleted

## 2020-08-30 DIAGNOSIS — Z7984 Long term (current) use of oral hypoglycemic drugs: Secondary | ICD-10-CM | POA: Insufficient documentation

## 2020-08-30 DIAGNOSIS — Z7982 Long term (current) use of aspirin: Secondary | ICD-10-CM | POA: Diagnosis not present

## 2020-08-30 DIAGNOSIS — Z87891 Personal history of nicotine dependence: Secondary | ICD-10-CM | POA: Insufficient documentation

## 2020-08-30 DIAGNOSIS — I110 Hypertensive heart disease with heart failure: Secondary | ICD-10-CM | POA: Diagnosis not present

## 2020-08-30 DIAGNOSIS — I509 Heart failure, unspecified: Secondary | ICD-10-CM | POA: Diagnosis not present

## 2020-08-30 DIAGNOSIS — E119 Type 2 diabetes mellitus without complications: Secondary | ICD-10-CM | POA: Diagnosis not present

## 2020-08-30 DIAGNOSIS — Z79899 Other long term (current) drug therapy: Secondary | ICD-10-CM | POA: Insufficient documentation

## 2020-08-30 DIAGNOSIS — R0602 Shortness of breath: Secondary | ICD-10-CM | POA: Diagnosis present

## 2020-08-30 LAB — CBC
HCT: 36.1 % (ref 36.0–46.0)
Hemoglobin: 11.6 g/dL — ABNORMAL LOW (ref 12.0–15.0)
MCH: 24.4 pg — ABNORMAL LOW (ref 26.0–34.0)
MCHC: 32.1 g/dL (ref 30.0–36.0)
MCV: 75.8 fL — ABNORMAL LOW (ref 80.0–100.0)
Platelets: 238 10*3/uL (ref 150–400)
RBC: 4.76 MIL/uL (ref 3.87–5.11)
RDW: 16.4 % — ABNORMAL HIGH (ref 11.5–15.5)
WBC: 8.5 10*3/uL (ref 4.0–10.5)
nRBC: 0 % (ref 0.0–0.2)

## 2020-08-30 NOTE — ED Triage Notes (Signed)
Pt ambulatory to triage.  Pt reports sob since yesterday.  No chest pain.  No n/v/d  Nonsmoker.  Pt reports a cough.   Pt alert  Speech clear

## 2020-08-31 ENCOUNTER — Emergency Department
Admission: EM | Admit: 2020-08-31 | Discharge: 2020-08-31 | Disposition: A | Discharge status: Home / Self Care | Payer: Medicare Other | Attending: Emergency Medicine | Admitting: Emergency Medicine

## 2020-08-31 DIAGNOSIS — I509 Heart failure, unspecified: Secondary | ICD-10-CM

## 2020-08-31 DIAGNOSIS — N3000 Acute cystitis without hematuria: Secondary | ICD-10-CM

## 2020-08-31 LAB — URINALYSIS, ROUTINE W REFLEX MICROSCOPIC
Bilirubin Urine: NEGATIVE
Glucose, UA: NEGATIVE mg/dL
Hgb urine dipstick: NEGATIVE
Ketones, ur: NEGATIVE mg/dL
Nitrite: POSITIVE — AB
Protein, ur: NEGATIVE mg/dL
Specific Gravity, Urine: 1.006 (ref 1.005–1.030)
pH: 7 (ref 5.0–8.0)

## 2020-08-31 LAB — BASIC METABOLIC PANEL
Anion gap: 12 (ref 5–15)
BUN: 16 mg/dL (ref 8–23)
CO2: 26 mmol/L (ref 22–32)
Calcium: 9 mg/dL (ref 8.9–10.3)
Chloride: 101 mmol/L (ref 98–111)
Creatinine, Ser: 0.69 mg/dL (ref 0.44–1.00)
GFR, Estimated: >60 mL/min (ref >60)
Glucose, Bld: 129 mg/dL — ABNORMAL HIGH (ref 70–99)
Potassium: 3.2 mmol/L — ABNORMAL LOW (ref 3.5–5.1)
Sodium: 139 mmol/L (ref 135–145)

## 2020-08-31 LAB — TROPONIN I (HIGH SENSITIVITY)
Troponin I (High Sensitivity): 11 ng/L (ref <18)
Troponin I (High Sensitivity): 11 ng/L (ref <18)

## 2020-08-31 LAB — BRAIN NATRIURETIC PEPTIDE: B Natriuretic Peptide: 443.3 pg/mL — ABNORMAL HIGH (ref 0.0–100.0)

## 2020-08-31 MED ORDER — FUROSEMIDE 10 MG/ML IJ SOLN
20.0000 mg | Freq: Once | INTRAMUSCULAR | Status: AC
  Administered 2020-08-31: 20 mg via INTRAVENOUS
  Filled 2020-08-31: qty 4

## 2020-08-31 MED ORDER — CEPHALEXIN 500 MG PO CAPS: 500.0000 mg | ORAL_CAPSULE | Freq: Three times a day (TID) | ORAL | 0 refills | Status: AC

## 2020-08-31 NOTE — ED Notes (Signed)
Pt given ginger ale per request

## 2020-08-31 NOTE — ED Notes (Signed)
Pt reports feeling short of breath since yesterday. Hx of CHF. Pt reports she has been ingesting more salty foods lately. Pt denies pain and all symptoms other than shortness of breath. Lung sounds clear and equal in all fields. Pt connected to cardiac monitor and given pillow. Chux pad placed beneath pt. External urinary catheter placed and connected to suction per pt request. Pt covered with warm blankets. Pt asking for husband to be called to come back to room with her. Phone call made to notify husband by this RN. Denies further needs at this time.

## 2020-08-31 NOTE — Discharge Instructions (Addendum)
Please return for any worsening of your shortness of breath.  Please take the extra torsemide today and then return to your regular dose.  Follow-up with your regular doctor in a week.   You do seem to have a mild urinary tract infection.  I will give you Keflex 1 pill 3 times a day.  This should make you feel better within a day or 2.  If it does not please return or see your doctor and we can change antibiotic.  Oftentimes there is resistance to one antibiotic or another nowadays

## 2020-08-31 NOTE — ED Provider Notes (Addendum)
[facility] Emergency Department Provider Note   ____________________________________________   First MD Initiated Contact with Patient 08/31/20 0515     (approximate)  I have reviewed the triage vital signs and the nursing notes.   HISTORY  Chief Complaint Shortness of Breath    HPI Betty Luna is a 69 y.o. female who comes in complaining of CHF exacerbation and shortness of breath.  She says she has been taking her medicines and has enough but ate some salty food and is now having more shortness of breath.  She is not had any chest pain.  She is otherwise doing well.         Past Medical History:  Diagnosis Date  . Anemia   . Arthritis   . CHF (congestive heart failure) (HCC)   . Depression   . Diabetes mellitus without complication (HCC)   . GERD (gastroesophageal reflux disease)   . Gout   . Hypertension     Patient Active Problem List   Diagnosis Date Noted  . Shortness of breath 06/22/2019  . Uncontrolled hypertension 06/22/2019  . SOB (shortness of breath) 06/22/2019  . Pyelonephritis 06/26/2017    Past Surgical History:  Procedure Laterality Date  . ABDOMINAL HYSTERECTOMY    . CATARACT EXTRACTION W/PHACO Right 04/16/2017   Procedure: CATARACT EXTRACTION PHACO AND INTRAOCULAR LENS PLACEMENT (IOC);  Surgeon: Galen Manila, MD;  Location: [facility] ORS;  Service: Ophthalmology;  Laterality: Right;  [place] 01:06 AP% 16.5 CDE 11.03 Fluid pack lot # [id number]  . CATARACT EXTRACTION W/PHACO Left 10/01/2017   Procedure: CATARACT EXTRACTION PHACO AND INTRAOCULAR LENS PLACEMENT (IOC)-LEFT DIABETIC;  Surgeon: Galen Manila, MD;  Location: [facility] ORS;  Service: Ophthalmology;  Laterality: Left;  [place] 01:05.7 AP% 12.2 CDE 7.42 Fluid pack lot # [id number]  . CTR Left   . TONSILLECTOMY      Prior to Admission medications   Medication Sig Start Date End Date Taking? Authorizing Provider  allopurinol (ZYLOPRIM) 100 MG tablet Take 100 mg by  mouth daily. 03/01/20   [provider]  aspirin EC 81 MG tablet Take 81 mg by mouth daily.    [provider]  carvedilol (COREG) 25 MG tablet Take 25 mg by mouth 2 (two) times daily.  02/14/15   [provider]  cephALEXin (KEFLEX) 500 MG capsule Take 1 capsule (500 mg total) by mouth 3 (three) times daily for 10 days. 08/31/20 09/10/20  Arnaldo Natal, MD  colchicine 0.6 MG tablet Take 0.6 mg by mouth daily.    [provider]  DULoxetine (CYMBALTA) 60 MG capsule Take 60 mg by mouth daily. 03/01/20   [provider]  losartan (COZAAR) 50 MG tablet Take 50 mg by mouth daily.  01/23/15   [provider]  metFORMIN (GLUCOPHAGE-XR) 500 MG 24 hr tablet Take 1,000 mg by mouth 2 (two) times daily.     [provider]  methocarbamol (ROBAXIN) 500 MG tablet Take 1 tablet (500 mg total) by mouth 4 (four) times daily. 06/18/20   Cuthriell, Delorise Royals, PA-C  ondansetron (ZOFRAN-ODT) 4 MG disintegrating tablet Take 1 tablet (4 mg total) by mouth every 8 (eight) hours as needed for nausea or vomiting. 06/18/20   Cuthriell, Delorise Royals, PA-C  pantoprazole (PROTONIX) 40 MG tablet Take 40 mg by mouth daily. 03/07/17   [provider]  predniSONE (DELTASONE) 50 MG tablet Take 1 tablet (50 mg total) by mouth daily with breakfast. 06/18/20   Cuthriell, Delorise Royals, PA-C  torsemide (DEMADEX) 10 MG tablet Take 10 mg by mouth daily.  03/23/15   [provider]  traMADol (ULTRAM) 50 MG tablet Take 1 tablet (50 mg total) by mouth every 6 (six) hours as needed. 06/18/20   Cuthriell, Delorise Royals, PA-C  Vitamin D, Ergocalciferol, (DRISDOL) 1.25 MG (50000 UNIT) CAPS capsule Take 50,000 Units by mouth once a week. 02/25/20   [provider]    Allergies Omeprazole, Tramadol, Atorvastatin, and Codeine  Family History  Problem Relation Age of Onset  . Alcohol abuse Brother     Social History Social History   Tobacco Use  . Smoking status:  Former Games developer  . Smokeless tobacco: Current User    Types: Snuff  Vaping Use  . Vaping Use: Never used  Substance Use Topics  . Alcohol use: Yes    Comment: occasonally  . Drug use: No    Review of Systems  Constitutional: No fever/chills Eyes: No visual changes. ENT: No sore throat. Cardiovascular: Denies chest pain. Respiratory:shortness of breath. Gastrointestinal: No abdominal pain.  No nausea, no vomiting.  No diarrhea.  No constipation. Genitourinary: Negative for dysuria. Musculoskeletal: Negative for back pain. Skin: Negative for rash. Neurological: Negative for headaches, focal weakness   ____________________________________________   PHYSICAL EXAM:  VITAL SIGNS: ED Triage Vitals  Enc Vitals Group     BP 08/30/20 2328 (!) 179/91     Pulse Rate 08/30/20 2328 86     Resp 08/30/20 2328 20     Temp 08/30/20 2328 98.2 F (36.8 C)     Temp Source 08/30/20 2328 Oral     SpO2 08/30/20 2328 99 %     Weight 08/30/20 2329 225 lb (102.1 kg)     Height 08/30/20 2329 5\' 2"  (1.575 m)     Head Circumference --      Peak Flow --      Pain Score 08/30/20 2329 5     Pain Loc --      Pain Edu? --      Excl. in GC? --    Constitutional: Alert and oriented. Well appearing and in no acute distress. Eyes: Conjunctivae are normal. PER Head: Atraumatic. Nose: No congestion/rhinnorhea. Mouth/Throat: Mucous membranes are moist.  Oropharynx non-erythematous. Neck: No stridor.  Cardiovascular: Normal rate, regular rhythm. Grossly normal heart sounds.  Good peripheral circulation. Respiratory: Normal respiratory effort.  No retractions. Lungs some crackles in the bases bilaterally Gastrointestinal: Soft and nontender. No distention. No abdominal bruits.  Musculoskeletal: No lower extremity tenderness slight trace edema.   Neurologic:  Normal speech and language. No gross focal neurologic deficits are appreciated. . Skin:  Skin is warm, dry and intact. No rash  noted.  ____________________________________________   LABS (all labs ordered are listed, but only abnormal results are displayed)  Labs Reviewed  BASIC METABOLIC PANEL - Abnormal; Notable for the following components:      Result Value   Potassium 3.2 (*)    Glucose, Bld 129 (*)    All other components within normal limits  CBC - Abnormal; Notable for the following components:   Hemoglobin 11.6 (*)    MCV 75.8 (*)    MCH 24.4 (*)    RDW 16.4 (*)    All other components within normal limits  BRAIN NATRIURETIC PEPTIDE - Abnormal; Notable for the following components:   B Natriuretic Peptide 443.3 (*)    All other components within normal limits  URINALYSIS, ROUTINE W REFLEX MICROSCOPIC - Abnormal; Notable for the following  components:   Color, Urine YELLOW (*)    APPearance CLEAR (*)    Nitrite POSITIVE (*)    Leukocytes,Ua TRACE (*)    Bacteria, UA MANY (*)    All other components within normal limits  TROPONIN I (HIGH SENSITIVITY)  TROPONIN I (HIGH SENSITIVITY)   ____________________________________________  EKG   ____________________________________________  RADIOLOGY Jill Poling, personally viewed and evaluated these images (plain radiographs) as part of my medical decision making, as well as reviewing the written report by the radiologist.  ED MD interpretation: Chest x-ray read by radiology reviewed by me appears to to me to be consistent with congestive heart failure.  This of course takes into account her clinical presentation which the radiologist did not have.  Official radiology report(s): DG Chest 2 View  Result Date: 08/30/2020 CLINICAL DATA:  Dyspnea EXAM: CHEST - 2 VIEW COMPARISON:  07/12/2020 FINDINGS: The lungs are symmetrically expanded. Since the prior examination, there has developed diffuse interstitial pulmonary infiltrate with superimposed nodularity noted within the lung bases bilaterally. Together, the findings are suggestive of an  atypical infectious or inflammatory process such as viral pneumonia. No pneumothorax or pleural effusion. Cardiac size within normal limits. No acute bone abnormality. IMPRESSION: Interval development of widespread interstitial infiltrate and pulmonary nodularity most in keeping with an atypical infectious process in the acute setting. Electronically Signed   By: Helyn Numbers MD   On: 08/30/2020 23:50    ____________________________________________   PROCEDURES  Procedure(s) performed (including Critical Care):  Procedures   ____________________________________________   INITIAL IMPRESSION / ASSESSMENT AND PLAN / ED COURSE  Patient feels much better after Lasix.  She is not short of breath anymore.  Her BNP was elevated.  Her white count was not elevated and she did not have a fever.  I do not think she has any infectious process going on.  Her troponin was stable.  Her EKG did not show any sign of ischemia.  She did complain of urinary frequency and urgency.  She does not have any white blood cells in the urine but there are many bacteria.  I will treat her with some Keflex for presumed UTI.  She has been taking a double dose of torsemide for the last 2 days but now after the extra Lasix feels a lot better.  I will have her take her furosemide double dose 1 more time and then return to her regular dose.  I will have her follow-up with her regular doctor to be sure that everything is doing well.  If she gets worse or develops any new symptoms she will return here.              ____________________________________________   FINAL CLINICAL IMPRESSION(S) / ED DIAGNOSES  Final diagnoses:  Congestive heart failure, unspecified HF chronicity, unspecified heart failure type (HCC)  Acute cystitis without hematuria     ED Discharge Orders         Ordered    cephALEXin (KEFLEX) 500 MG capsule  3 times daily        08/31/20 1937          *Please note:  Betty Luna was  evaluated in Emergency Department on 08/31/2020 for the symptoms described in the history of present illness. She was evaluated in the context of the global COVID-19 pandemic, which necessitated consideration that the patient might be at risk for infection with the SARS-CoV-2 virus that causes COVID-19. Institutional protocols and algorithms that pertain to the  evaluation of patients at risk for COVID-19 are in a state of rapid change based on information released by regulatory bodies including the CDC and federal and state organizations. These policies and algorithms were followed during the patient's care in the ED.  Some ED evaluations and interventions may be delayed as a result of limited staffing during and the pandemic.*   Note:  This document was prepared using Dragon voice recognition software and may include unintentional dictation errors.    Arnaldo Natal, MD 08/31/20 2563    Arnaldo Natal, MD 08/31/20 [phone]

## 2020-10-04 ENCOUNTER — Other Ambulatory Visit: Payer: Self-pay | Admitting: Obstetrics and Gynecology

## 2020-10-04 DIAGNOSIS — Z1231 Encounter for screening mammogram for malignant neoplasm of breast: Secondary | ICD-10-CM

## 2020-11-21 ENCOUNTER — Other Ambulatory Visit: Payer: Self-pay

## 2020-11-21 ENCOUNTER — Encounter: Payer: Self-pay | Admitting: Emergency Medicine

## 2020-11-21 ENCOUNTER — Emergency Department: Payer: Medicare Other

## 2020-11-21 DIAGNOSIS — F17290 Nicotine dependence, other tobacco product, uncomplicated: Secondary | ICD-10-CM | POA: Insufficient documentation

## 2020-11-21 DIAGNOSIS — I509 Heart failure, unspecified: Secondary | ICD-10-CM | POA: Insufficient documentation

## 2020-11-21 DIAGNOSIS — E119 Type 2 diabetes mellitus without complications: Secondary | ICD-10-CM | POA: Diagnosis not present

## 2020-11-21 DIAGNOSIS — K219 Gastro-esophageal reflux disease without esophagitis: Secondary | ICD-10-CM | POA: Diagnosis not present

## 2020-11-21 DIAGNOSIS — I110 Hypertensive heart disease with heart failure: Secondary | ICD-10-CM | POA: Insufficient documentation

## 2020-11-21 DIAGNOSIS — Z7984 Long term (current) use of oral hypoglycemic drugs: Secondary | ICD-10-CM | POA: Diagnosis not present

## 2020-11-21 DIAGNOSIS — Z79899 Other long term (current) drug therapy: Secondary | ICD-10-CM | POA: Insufficient documentation

## 2020-11-21 DIAGNOSIS — R0789 Other chest pain: Secondary | ICD-10-CM | POA: Insufficient documentation

## 2020-11-21 LAB — CBC WITH DIFFERENTIAL/PLATELET
Abs Immature Granulocytes: 0.02 10*3/uL (ref 0.00–0.07)
Basophils Absolute: 0 10*3/uL (ref 0.0–0.1)
Basophils Relative: 0 %
Eosinophils Absolute: 0.2 10*3/uL (ref 0.0–0.5)
Eosinophils Relative: 3 %
HCT: 38.1 % (ref 36.0–46.0)
Hemoglobin: 12.1 g/dL (ref 12.0–15.0)
Immature Granulocytes: 0 %
Lymphocytes Relative: 38 %
Lymphs Abs: 2.7 10*3/uL (ref 0.7–4.0)
MCH: 23.7 pg — ABNORMAL LOW (ref 26.0–34.0)
MCHC: 31.8 g/dL (ref 30.0–36.0)
MCV: 74.6 fL — ABNORMAL LOW (ref 80.0–100.0)
Monocytes Absolute: 0.8 10*3/uL (ref 0.1–1.0)
Monocytes Relative: 12 %
Neutro Abs: 3.4 10*3/uL (ref 1.7–7.7)
Neutrophils Relative %: 47 %
Platelets: 217 10*3/uL (ref 150–400)
RBC: 5.11 MIL/uL (ref 3.87–5.11)
RDW: 16.4 % — ABNORMAL HIGH (ref 11.5–15.5)
WBC: 7.2 10*3/uL (ref 4.0–10.5)
nRBC: 0 % (ref 0.0–0.2)

## 2020-11-21 LAB — COMPREHENSIVE METABOLIC PANEL
ALT: 21 U/L (ref 0–44)
AST: 26 U/L (ref 15–41)
Albumin: 3.6 g/dL (ref 3.5–5.0)
Alkaline Phosphatase: 98 U/L (ref 38–126)
Anion gap: 16 — ABNORMAL HIGH (ref 5–15)
BUN: 19 mg/dL (ref 8–23)
CO2: 27 mmol/L (ref 22–32)
Calcium: 9.4 mg/dL (ref 8.9–10.3)
Chloride: 100 mmol/L (ref 98–111)
Creatinine, Ser: 0.92 mg/dL (ref 0.44–1.00)
GFR, Estimated: >60 mL/min (ref >60)
Glucose, Bld: 186 mg/dL — ABNORMAL HIGH (ref 70–99)
Potassium: 3.3 mmol/L — ABNORMAL LOW (ref 3.5–5.1)
Sodium: 143 mmol/L (ref 135–145)
Total Bilirubin: 0.6 mg/dL (ref 0.3–1.2)
Total Protein: 7.1 g/dL (ref 6.5–8.1)

## 2020-11-21 LAB — TROPONIN I (HIGH SENSITIVITY): Troponin I (High Sensitivity): 13 ng/L (ref <18)

## 2020-11-21 NOTE — ED Triage Notes (Signed)
Patient ambulatory to triage with steady gait, without difficulty or distress noted; pt reports rt sided CP today with no accomp symptoms,no radiating pain

## 2020-11-22 ENCOUNTER — Emergency Department
Admission: EM | Admit: 2020-11-22 | Discharge: 2020-11-22 | Disposition: A | Discharge status: Home / Self Care | Payer: Medicare Other | Attending: Emergency Medicine | Admitting: Emergency Medicine

## 2020-11-22 DIAGNOSIS — R0789 Other chest pain: Secondary | ICD-10-CM

## 2020-11-22 LAB — TROPONIN I (HIGH SENSITIVITY): Troponin I (High Sensitivity): 11 ng/L (ref <18)

## 2020-11-22 NOTE — ED Provider Notes (Signed)
[facility] Emergency Department Provider Note  ____________________________________________  Time seen: Approximately 4:38 AM  I have reviewed the triage vital signs and the nursing notes.   HISTORY  Chief Complaint Chest Pain    HPI Betty Luna is a 70 y.o. female with a history of CHF diabetes gout hypertension and GERD who comes ED complaining of chest pain that started yesterday while at rest.  Not exertional, not pleuritic.  Right side of the chest, intermittent lasting less than a second each time.  Nonradiating, worse lying down, better sitting upright.  No shortness of breath diaphoresis or vomiting.  No dizziness or palpitations.  Currently resolved     Past Medical History:  Diagnosis Date  . Anemia   . Arthritis   . CHF (congestive heart failure) (HCC)   . Depression   . Diabetes mellitus without complication (HCC)   . GERD (gastroesophageal reflux disease)   . Gout   . Hypertension      Patient Active Problem List   Diagnosis Date Noted  . Shortness of breath 06/22/2019  . Uncontrolled hypertension 06/22/2019  . SOB (shortness of breath) 06/22/2019  . Pyelonephritis 06/26/2017     Past Surgical History:  Procedure Laterality Date  . ABDOMINAL HYSTERECTOMY    . CATARACT EXTRACTION W/PHACO Right 04/16/2017   Procedure: CATARACT EXTRACTION PHACO AND INTRAOCULAR LENS PLACEMENT (IOC);  Surgeon: Galen Manila, MD;  Location: [facility] ORS;  Service: Ophthalmology;  Laterality: Right;  [place] 01:06 AP% 16.5 CDE 11.03 Fluid pack lot # [id number]  . CATARACT EXTRACTION W/PHACO Left 10/01/2017   Procedure: CATARACT EXTRACTION PHACO AND INTRAOCULAR LENS PLACEMENT (IOC)-LEFT DIABETIC;  Surgeon: Galen Manila, MD;  Location: [facility] ORS;  Service: Ophthalmology;  Laterality: Left;  [place] 01:05.7 AP% 12.2 CDE 7.42 Fluid pack lot # [id number]  . CTR Left   . TONSILLECTOMY       Prior to Admission medications   Medication Sig Start Date End  Date Taking? Authorizing Provider  allopurinol (ZYLOPRIM) 100 MG tablet Take 100 mg by mouth daily. 03/01/20   [provider]  aspirin EC 81 MG tablet Take 81 mg by mouth daily.    [provider]  carvedilol (COREG) 25 MG tablet Take 25 mg by mouth 2 (two) times daily.  02/14/15   [provider]  colchicine 0.6 MG tablet Take 0.6 mg by mouth daily.    [provider]  DULoxetine (CYMBALTA) 60 MG capsule Take 60 mg by mouth daily. 03/01/20   [provider]  losartan (COZAAR) 50 MG tablet Take 50 mg by mouth daily.  01/23/15   [provider]  metFORMIN (GLUCOPHAGE-XR) 500 MG 24 hr tablet Take 1,000 mg by mouth 2 (two) times daily.     [provider]  methocarbamol (ROBAXIN) 500 MG tablet Take 1 tablet (500 mg total) by mouth 4 (four) times daily. 06/18/20   Cuthriell, Delorise Royals, PA-C  ondansetron (ZOFRAN-ODT) 4 MG disintegrating tablet Take 1 tablet (4 mg total) by mouth every 8 (eight) hours as needed for nausea or vomiting. 06/18/20   Cuthriell, Delorise Royals, PA-C  pantoprazole (PROTONIX) 40 MG tablet Take 40 mg by mouth daily. 03/07/17   [provider]  predniSONE (DELTASONE) 50 MG tablet Take 1 tablet (50 mg total) by mouth daily with breakfast. 06/18/20   Cuthriell, Delorise Royals, PA-C  torsemide (DEMADEX) 10 MG tablet Take 10 mg by mouth daily.  03/23/15   [provider]  traMADol (ULTRAM) 50 MG tablet Take  1 tablet (50 mg total) by mouth every 6 (six) hours as needed. 06/18/20   Cuthriell, Delorise Royals, PA-C  Vitamin D, Ergocalciferol, (DRISDOL) 1.25 MG (50000 UNIT) CAPS capsule Take 50,000 Units by mouth once a week. 02/25/20   [provider]     Allergies Omeprazole, Tramadol, Atorvastatin, and Codeine   Family History  Problem Relation Age of Onset  . Alcohol abuse Brother     Social History Social History   Tobacco Use  . Smoking status: Former Games developer  . Smokeless tobacco: Current User    Types:  Snuff  Vaping Use  . Vaping Use: Never used  Substance Use Topics  . Alcohol use: Yes    Comment: occasonally  . Drug use: No    Review of Systems  Constitutional:   No fever or chills.  ENT:   No sore throat. No rhinorrhea. Cardiovascular:   No chest pain or syncope. Respiratory:   No dyspnea or cough. Gastrointestinal:   Negative for abdominal pain, vomiting and diarrhea.  Musculoskeletal:   Negative for focal pain or swelling All other systems reviewed and are negative except as documented above in ROS and HPI.  ____________________________________________   PHYSICAL EXAM:  VITAL SIGNS: ED Triage Vitals  Enc Vitals Group     BP 11/21/20 2118 (!) 162/82     Pulse Rate 11/21/20 2118 90     Resp 11/21/20 2118 20     Temp 11/21/20 2118 97.9 F (36.6 C)     Temp Source 11/21/20 2118 Oral     SpO2 11/21/20 2118 97 %     Weight 11/21/20 2115 220 lb (99.8 kg)     Height 11/21/20 2115 5\' 1"  (1.549 m)     Head Circumference --      Peak Flow --      Pain Score 11/21/20 2115 7     Pain Loc --      Pain Edu? --      Excl. in GC? --     Vital signs reviewed, nursing assessments reviewed.   Constitutional:   Alert and oriented. Non-toxic appearance. Eyes:   Conjunctivae are normal. EOMI. PERRL. ENT      Head:   Normocephalic and atraumatic.      Nose:   Wearing a mask.      Mouth/Throat:   Wearing a mask.      Neck:   No meningismus. Full ROM. Hematological/Lymphatic/Immunilogical:   No cervical lymphadenopathy. Cardiovascular:   RRR. Symmetric bilateral radial and DP pulses.  No murmurs. Cap refill less than 2 seconds. Respiratory:   Normal respiratory effort without tachypnea/retractions. Breath sounds are clear and equal bilaterally. No wheezes/rales/rhonchi. Gastrointestinal:   Soft and nontender. Non distended. There is no CVA tenderness.  No rebound, rigidity, or guarding. Musculoskeletal:   Normal range of motion in all extremities. No joint effusions.  No lower  extremity tenderness.  No edema. Neurologic:   Normal speech and language.  Motor grossly intact. No acute focal neurologic deficits are appreciated.  Skin:    Skin is warm, dry and intact. No rash noted.  No petechiae, purpura, or bullae.  ____________________________________________    LABS (pertinent positives/negatives) (all labs ordered are listed, but only abnormal results are displayed) Labs Reviewed  CBC WITH DIFFERENTIAL/PLATELET - Abnormal; Notable for the following components:      Result Value   MCV 74.6 (*)    MCH 23.7 (*)    RDW 16.4 (*)    All other components  within normal limits  COMPREHENSIVE METABOLIC PANEL - Abnormal; Notable for the following components:   Potassium 3.3 (*)    Glucose, Bld 186 (*)    Anion gap 16 (*)    All other components within normal limits  TROPONIN I (HIGH SENSITIVITY)  TROPONIN I (HIGH SENSITIVITY)   ____________________________________________   EKG  Interpreted by me Sinus rhythm rate of 87, normal axis and intervals.  Poor R wave progression.  Voltage criteria for LVH in the high lateral leads.  No acute ischemic changes.  ____________________________________________    RADIOLOGY  DG Chest 2 View  Result Date: 11/21/2020 CLINICAL DATA:  Chest pain on the right EXAM: CHEST - 2 VIEW COMPARISON:  08/30/2020 FINDINGS: Cardiac shadow is within normal limits. Lungs are well aerated bilaterally. Significant improvement in the degree of interstitial infiltrate is noted when compared with the prior exam. No sizable effusion is seen. Mild basilar atelectasis/scarring is seen. Degenerative changes of the thoracic spine are noted. IMPRESSION: Significant improved aeration in the lungs bilaterally. Electronically Signed   By: Alcide Clever M.D.   On: 11/21/2020 21:36    ____________________________________________   PROCEDURES Procedures  ____________________________________________    CLINICAL IMPRESSION / ASSESSMENT AND PLAN /  ED COURSE  Medications ordered in the ED: Medications - No data to display  Pertinent labs & imaging results that were available during my care of the patient were reviewed by me and considered in my medical decision making (see chart for details).  Betty Luna was evaluated in Emergency Department on 11/22/2020 for the symptoms described in the history of present illness. She was evaluated in the context of the global COVID-19 pandemic, which necessitated consideration that the patient might be at risk for infection with the SARS-CoV-2 virus that causes COVID-19. Institutional protocols and algorithms that pertain to the evaluation of patients at risk for COVID-19 are in a state of rapid change based on information released by regulatory bodies including the CDC and federal and state organizations. These policies and algorithms were followed during the patient's care in the ED.   Patient presents with atypical chest pain, currently resolved for the past few hours. Considering the patient's symptoms, medical history, and physical examination today, I have low suspicion for ACS, PE, TAD, pneumothorax, carditis, mediastinitis, pneumonia, CHF, or sepsis.  EKG, chest x-ray, labs all reassuring.  Vital signs unremarkable, exam is benign and nonfocal.  Suspect GERD.  Stable for discharge.      ____________________________________________   FINAL CLINICAL IMPRESSION(S) / ED DIAGNOSES    Final diagnoses:  Atypical chest pain     ED Discharge Orders    None      Portions of this note were generated with dragon dictation software. Dictation errors may occur despite best attempts at proofreading.   Sharman Cheek, MD 11/22/20 [id number]

## 2020-11-24 ENCOUNTER — Other Ambulatory Visit: Payer: Self-pay

## 2020-12-29 ENCOUNTER — Other Ambulatory Visit: Payer: Self-pay

## 2020-12-29 ENCOUNTER — Emergency Department
Admission: EM | Admit: 2020-12-29 | Discharge: 2020-12-29 | Disposition: A | Discharge status: Home / Self Care | Payer: Medicare Other | Attending: Emergency Medicine | Admitting: Emergency Medicine

## 2020-12-29 DIAGNOSIS — R42 Dizziness and giddiness: Secondary | ICD-10-CM | POA: Diagnosis not present

## 2020-12-29 DIAGNOSIS — Z20822 Contact with and (suspected) exposure to covid-19: Secondary | ICD-10-CM | POA: Insufficient documentation

## 2020-12-29 DIAGNOSIS — E119 Type 2 diabetes mellitus without complications: Secondary | ICD-10-CM | POA: Diagnosis not present

## 2020-12-29 DIAGNOSIS — Z79899 Other long term (current) drug therapy: Secondary | ICD-10-CM | POA: Diagnosis not present

## 2020-12-29 DIAGNOSIS — Z87891 Personal history of nicotine dependence: Secondary | ICD-10-CM | POA: Insufficient documentation

## 2020-12-29 DIAGNOSIS — I509 Heart failure, unspecified: Secondary | ICD-10-CM | POA: Diagnosis not present

## 2020-12-29 DIAGNOSIS — Z794 Long term (current) use of insulin: Secondary | ICD-10-CM

## 2020-12-29 DIAGNOSIS — I110 Hypertensive heart disease with heart failure: Secondary | ICD-10-CM | POA: Insufficient documentation

## 2020-12-29 LAB — BASIC METABOLIC PANEL
Anion gap: 13 (ref 5–15)
BUN: 17 mg/dL (ref 8–23)
CO2: 22 mmol/L (ref 22–32)
Calcium: 9.2 mg/dL (ref 8.9–10.3)
Chloride: 103 mmol/L (ref 98–111)
Creatinine, Ser: 0.75 mg/dL (ref 0.44–1.00)
GFR, Estimated: >60 mL/min (ref >60)
Glucose, Bld: 164 mg/dL — ABNORMAL HIGH (ref 70–99)
Potassium: 3.9 mmol/L (ref 3.5–5.1)
Sodium: 138 mmol/L (ref 135–145)

## 2020-12-29 LAB — URINALYSIS, COMPLETE (UACMP) WITH MICROSCOPIC
Bilirubin Urine: NEGATIVE
Glucose, UA: NEGATIVE mg/dL
Hgb urine dipstick: NEGATIVE
Ketones, ur: NEGATIVE mg/dL
Leukocytes,Ua: NEGATIVE
Nitrite: NEGATIVE
Protein, ur: NEGATIVE mg/dL
Specific Gravity, Urine: 1.005 (ref 1.005–1.030)
pH: 5 (ref 5.0–8.0)

## 2020-12-29 LAB — CBC
HCT: 42.3 % (ref 36.0–46.0)
Hemoglobin: 13 g/dL (ref 12.0–15.0)
MCH: 23.9 pg — ABNORMAL LOW (ref 26.0–34.0)
MCHC: 30.7 g/dL (ref 30.0–36.0)
MCV: 77.9 fL — ABNORMAL LOW (ref 80.0–100.0)
Platelets: 216 10*3/uL (ref 150–400)
RBC: 5.43 MIL/uL — ABNORMAL HIGH (ref 3.87–5.11)
RDW: 17.2 % — ABNORMAL HIGH (ref 11.5–15.5)
WBC: 5.1 10*3/uL (ref 4.0–10.5)
nRBC: 0 % (ref 0.0–0.2)

## 2020-12-29 LAB — RESP PANEL BY RT-PCR (FLU A&B, COVID) ARPGX2
Influenza A by PCR: NEGATIVE
Influenza B by PCR: NEGATIVE
SARS Coronavirus 2 by RT PCR: NEGATIVE

## 2020-12-29 NOTE — ED Provider Notes (Signed)
;[id number]=uy [facility] Emergency Department Provider Note  ____________________________________________  Time seen: Approximately 2:57 PM  I have reviewed the triage vital signs and the nursing notes.   HISTORY  Chief Complaint Dizziness    HPI Betty Luna is a 70 y.o. female with a history of GERD diabetes gout hypertension CHF  who comes ED complaining of intermittent dizziness for the past 2 days.  Has occurred in 3 separate incidents, each time happening when she is sitting upright and standing to get out of bed.  Last for a few minutes and then resolves.  No falls or trauma.  No acute neurologic symptoms or pain complaints.  No prodromal symptoms.  Feels back to normal currently.  She reports she is been eating and drinking normally.  No vomiting or diarrhea.  Her stepdaughter was recently diagnosed with metastatic breast cancer, she has been very stressed and very worried about her family members health.     Past Medical History:  Diagnosis Date  . Anemia   . Arthritis   . CHF (congestive heart failure) (HCC)   . Depression   . Diabetes mellitus without complication (HCC)   . GERD (gastroesophageal reflux disease)   . Gout   . Hypertension      Patient Active Problem List   Diagnosis Date Noted  . Shortness of breath 06/22/2019  . Uncontrolled hypertension 06/22/2019  . SOB (shortness of breath) 06/22/2019  . Pyelonephritis 06/26/2017     Past Surgical History:  Procedure Laterality Date  . ABDOMINAL HYSTERECTOMY    . CATARACT EXTRACTION W/PHACO Right 04/16/2017   Procedure: CATARACT EXTRACTION PHACO AND INTRAOCULAR LENS PLACEMENT (IOC);  Surgeon: Galen Manila, MD;  Location: [facility] ORS;  Service: Ophthalmology;  Laterality: Right;  [place] 01:06 AP% 16.5 CDE 11.03 Fluid pack lot # [id number]  . CATARACT EXTRACTION W/PHACO Left 10/01/2017   Procedure: CATARACT EXTRACTION PHACO AND INTRAOCULAR LENS PLACEMENT (IOC)-LEFT  DIABETIC;  Surgeon: Galen Manila, MD;  Location: [facility] ORS;  Service: Ophthalmology;  Laterality: Left;  [place] 01:05.7 AP% 12.2 CDE 7.42 Fluid pack lot # [id number]  . CTR Left   . TONSILLECTOMY       Prior to Admission medications   Medication Sig Start Date End Date Taking? Authorizing Provider  allopurinol (ZYLOPRIM) 100 MG tablet Take 100 mg by mouth daily. 03/01/20   [provider]  aspirin EC 81 MG tablet Take 81 mg by mouth daily.    [provider]  carvedilol (COREG) 25 MG tablet Take 25 mg by mouth 2 (two) times daily.  02/14/15   [provider]  colchicine 0.6 MG tablet Take 0.6 mg by mouth daily.    [provider]  DULoxetine (CYMBALTA) 60 MG capsule Take 60 mg by mouth daily. 03/01/20   [provider]  losartan (COZAAR) 50 MG tablet Take 50 mg by mouth daily.  01/23/15   [provider]  metFORMIN (GLUCOPHAGE-XR) 500 MG 24 hr tablet Take 1,000 mg by mouth 2 (two) times daily.     [provider]  methocarbamol (ROBAXIN) 500 MG tablet Take 1 tablet (500 mg total) by mouth 4 (four) times daily. 06/18/20   Cuthriell, Delorise Royals, PA-C  ondansetron (ZOFRAN-ODT) 4 MG disintegrating tablet Take 1 tablet (4 mg total) by mouth every 8 (eight) hours as needed for nausea or vomiting. 06/18/20   Cuthriell, Delorise Royals, PA-C  pantoprazole (PROTONIX) 40 MG tablet Take 40 mg by mouth daily. 03/07/17   [provider]  predniSONE (  DELTASONE) 50 MG tablet Take 1 tablet (50 mg total) by mouth daily with breakfast. 06/18/20   Cuthriell, Delorise Royals, PA-C  torsemide (DEMADEX) 10 MG tablet Take 10 mg by mouth daily.  03/23/15   [provider]  traMADol (ULTRAM) 50 MG tablet Take 1 tablet (50 mg total) by mouth every 6 (six) hours as needed. 06/18/20   Cuthriell, Delorise Royals, PA-C  Vitamin D, Ergocalciferol, (DRISDOL) 1.25 MG (50000 UNIT) CAPS capsule Take 50,000 Units by mouth once a week. 02/25/20   [provider]      Allergies Omeprazole, Tramadol, Atorvastatin, and Codeine   Family History  Problem Relation Age of Onset  . Alcohol abuse Brother     Social History Social History   Tobacco Use  . Smoking status: Former Games developer  . Smokeless tobacco: Current User    Types: Snuff  Vaping Use  . Vaping Use: Never used  Substance Use Topics  . Alcohol use: Yes    Comment: occasonally  . Drug use: No    Review of Systems  Constitutional:   No fever or chills.  ENT:   No sore throat. No rhinorrhea. Cardiovascular:   No chest pain or syncope. Respiratory:   No dyspnea or cough. Gastrointestinal:   Negative for abdominal pain, vomiting and diarrhea.  Musculoskeletal:   Negative for focal pain or swelling All other systems reviewed and are negative except as documented above in ROS and HPI.  ____________________________________________   PHYSICAL EXAM:  VITAL SIGNS: ED Triage Vitals  Enc Vitals Group     BP 12/29/20 1200 138/78     Pulse Rate 12/29/20 1200 84     Resp 12/29/20 1200 20     Temp 12/29/20 1200 98.1 F (36.7 C)     Temp Source 12/29/20 1200 Oral     SpO2 12/29/20 1200 97 %     Weight --      Height --      Head Circumference --      Peak Flow --      Pain Score 12/29/20 1204 0     Pain Loc --      Pain Edu? --      Excl. in GC? --     Vital signs reviewed, nursing assessments reviewed.   Constitutional:   Alert and oriented. Non-toxic appearance. Eyes:   Conjunctivae are normal. EOMI. PERRL. ENT      Head:   Normocephalic and atraumatic.      Nose:   Wearing a mask.      Mouth/Throat:   Wearing a mask.      Neck:   No meningismus. Full ROM. Hematological/Lymphatic/Immunilogical:   No cervical lymphadenopathy. Cardiovascular:   RRR. Symmetric bilateral radial and DP pulses.  No murmurs. Cap refill less than 2 seconds. Respiratory:   Normal respiratory effort without tachypnea/retractions. Breath sounds are clear and equal bilaterally. No  wheezes/rales/rhonchi. Gastrointestinal:   Soft and nontender. Non distended. There is no CVA tenderness.  No rebound, rigidity, or guarding. Genitourinary:   deferred Musculoskeletal:   Normal range of motion in all extremities. No joint effusions.  No lower extremity tenderness.  No edema. Neurologic:   Normal speech and language.  Cranial nerves III through XII intact Cerebellar function intact Motor grossly intact. No acute focal neurologic deficits are appreciated.  Skin:    Skin is warm, dry and intact. No rash noted.  No petechiae, purpura, or bullae.  ____________________________________________    LABS (pertinent positives/negatives) (all labs  ordered are listed, but only abnormal results are displayed) Labs Reviewed  BASIC METABOLIC PANEL - Abnormal; Notable for the following components:      Result Value   Glucose, Bld 164 (*)    All other components within normal limits  CBC - Abnormal; Notable for the following components:   RBC 5.43 (*)    MCV 77.9 (*)    MCH 23.9 (*)    RDW 17.2 (*)    All other components within normal limits  RESP PANEL BY RT-PCR (FLU A&B, COVID) ARPGX2  URINALYSIS, COMPLETE (UACMP) WITH MICROSCOPIC  CBG MONITORING, ED   ____________________________________________   EKG  Interpreted by me Normal sinus rhythm rate of 81, normal axis and intervals.  Poor R wave progression.  Normal ST segments.  Isolated T wave inversion in lead III which is nonspecific.  ____________________________________________    RADIOLOGY  No results found.  ____________________________________________   PROCEDURES Procedures  ____________________________________________  DIFFERENTIAL DIAGNOSIS   Dehydration, electrolyte abnormality, anemia, orthostatic insufficiency, positional vertigo  CLINICAL IMPRESSION / ASSESSMENT AND PLAN / ED COURSE  Medications ordered in the ED: Medications - No data to display  Pertinent labs & imaging results that were  available during my care of the patient were reviewed by me and considered in my medical decision making (see chart for details).  Betty Luna was evaluated in Emergency Department on 12/29/2020 for the symptoms described in the history of present illness. She was evaluated in the context of the global COVID-19 pandemic, which necessitated consideration that the patient might be at risk for infection with the SARS-CoV-2 virus that causes COVID-19. Institutional protocols and algorithms that pertain to the evaluation of patients at risk for COVID-19 are in a state of rapid change based on information released by regulatory bodies including the CDC and federal and state organizations. These policies and algorithms were followed during the patient's care in the ED.   Patient presents with 3 episodes of dizziness over the last 2 days, most recently this morning.  Occurs when she sits up and gets out of bed quickly.  Denies any prodromal symptoms, no palpitations, no chest pain or other pain complaints.  No vision changes paresthesias or weakness.  No head trauma, no falls.  Currently feels back to normal.  Vital signs are normal.  EKG and labs are reassuring.  Exam is normal.  Neurologically intact.  Suspect orthostatic lightheadedness with stress of her stepdaughters recent diagnosis contributing as well.      ____________________________________________   FINAL CLINICAL IMPRESSION(S) / ED DIAGNOSES    Final diagnoses:  Orthostatic dizziness     ED Discharge Orders    None      Portions of this note were generated with dragon dictation software. Dictation errors may occur despite best attempts at proofreading.   Sharman Cheek, MD 12/29/20 [phone]

## 2020-12-29 NOTE — ED Triage Notes (Signed)
Pt come with c/o dizziness this am. Pt state she was seating on her bed and felt lightheaded. Pt denies any SOb or CP  Pt states no dizziness at this time.

## 2021-02-16 ENCOUNTER — Ambulatory Visit
Admission: RE | Admit: 2021-02-16 | Discharge: 2021-02-16 | Disposition: A | Discharge status: Home / Self Care | Payer: Medicare Other | Source: Ambulatory Visit | Attending: Obstetrics and Gynecology | Admitting: Obstetrics and Gynecology

## 2021-02-16 ENCOUNTER — Other Ambulatory Visit: Payer: Self-pay

## 2021-02-16 DIAGNOSIS — Z1231 Encounter for screening mammogram for malignant neoplasm of breast: Secondary | ICD-10-CM | POA: Diagnosis present

## 2021-04-03 ENCOUNTER — Observation Stay: Payer: Medicare HMO

## 2021-04-03 ENCOUNTER — Emergency Department: Payer: Medicare HMO

## 2021-04-03 ENCOUNTER — Other Ambulatory Visit: Payer: Self-pay

## 2021-04-03 ENCOUNTER — Encounter: Payer: Self-pay | Admitting: Internal Medicine

## 2021-04-03 ENCOUNTER — Observation Stay
Admission: EM | Admit: 2021-04-03 | Discharge: 2021-04-03 | Disposition: A | Discharge status: Home / Self Care | Payer: Medicare HMO | Attending: Student | Admitting: Student

## 2021-04-03 DIAGNOSIS — Y92009 Unspecified place in unspecified non-institutional (private) residence as the place of occurrence of the external cause: Secondary | ICD-10-CM | POA: Insufficient documentation

## 2021-04-03 DIAGNOSIS — S300XXA Contusion of lower back and pelvis, initial encounter: Principal | ICD-10-CM | POA: Insufficient documentation

## 2021-04-03 DIAGNOSIS — W19XXXA Unspecified fall, initial encounter: Secondary | ICD-10-CM

## 2021-04-03 DIAGNOSIS — F1090 Alcohol use, unspecified, uncomplicated: Secondary | ICD-10-CM

## 2021-04-03 DIAGNOSIS — Z7289 Other problems related to lifestyle: Secondary | ICD-10-CM

## 2021-04-03 DIAGNOSIS — M25552 Pain in left hip: Secondary | ICD-10-CM | POA: Diagnosis not present

## 2021-04-03 DIAGNOSIS — Z20822 Contact with and (suspected) exposure to covid-19: Secondary | ICD-10-CM | POA: Insufficient documentation

## 2021-04-03 DIAGNOSIS — I110 Hypertensive heart disease with heart failure: Secondary | ICD-10-CM | POA: Insufficient documentation

## 2021-04-03 DIAGNOSIS — Z79899 Other long term (current) drug therapy: Secondary | ICD-10-CM | POA: Diagnosis not present

## 2021-04-03 DIAGNOSIS — S7002XA Contusion of left hip, initial encounter: Secondary | ICD-10-CM

## 2021-04-03 DIAGNOSIS — S92351A Displaced fracture of fifth metatarsal bone, right foot, initial encounter for closed fracture: Secondary | ICD-10-CM | POA: Diagnosis not present

## 2021-04-03 DIAGNOSIS — I10 Essential (primary) hypertension: Secondary | ICD-10-CM | POA: Diagnosis present

## 2021-04-03 DIAGNOSIS — I5042 Chronic combined systolic (congestive) and diastolic (congestive) heart failure: Secondary | ICD-10-CM | POA: Diagnosis not present

## 2021-04-03 DIAGNOSIS — Z7984 Long term (current) use of oral hypoglycemic drugs: Secondary | ICD-10-CM | POA: Diagnosis not present

## 2021-04-03 DIAGNOSIS — I429 Cardiomyopathy, unspecified: Secondary | ICD-10-CM

## 2021-04-03 DIAGNOSIS — Z87891 Personal history of nicotine dependence: Secondary | ICD-10-CM | POA: Diagnosis not present

## 2021-04-03 DIAGNOSIS — I5022 Chronic systolic (congestive) heart failure: Secondary | ICD-10-CM

## 2021-04-03 DIAGNOSIS — Z794 Long term (current) use of insulin: Secondary | ICD-10-CM | POA: Diagnosis not present

## 2021-04-03 DIAGNOSIS — S92354A Nondisplaced fracture of fifth metatarsal bone, right foot, initial encounter for closed fracture: Secondary | ICD-10-CM

## 2021-04-03 DIAGNOSIS — S92353A Displaced fracture of fifth metatarsal bone, unspecified foot, initial encounter for closed fracture: Secondary | ICD-10-CM

## 2021-04-03 DIAGNOSIS — E119 Type 2 diabetes mellitus without complications: Secondary | ICD-10-CM | POA: Diagnosis not present

## 2021-04-03 DIAGNOSIS — Z7982 Long term (current) use of aspirin: Secondary | ICD-10-CM | POA: Insufficient documentation

## 2021-04-03 DIAGNOSIS — W109XXA Fall (on) (from) unspecified stairs and steps, initial encounter: Secondary | ICD-10-CM | POA: Diagnosis not present

## 2021-04-03 DIAGNOSIS — Z789 Other specified health status: Secondary | ICD-10-CM

## 2021-04-03 DIAGNOSIS — E871 Hypo-osmolality and hyponatremia: Secondary | ICD-10-CM

## 2021-04-03 LAB — CBC WITH DIFFERENTIAL/PLATELET
Abs Immature Granulocytes: 0.04 10*3/uL (ref 0.00–0.07)
Basophils Absolute: 0 10*3/uL (ref 0.0–0.1)
Basophils Relative: 0 %
Eosinophils Absolute: 0.2 10*3/uL (ref 0.0–0.5)
Eosinophils Relative: 3 %
HCT: 35.9 % — ABNORMAL LOW (ref 36.0–46.0)
Hemoglobin: 11.9 g/dL — ABNORMAL LOW (ref 12.0–15.0)
Immature Granulocytes: 1 %
Lymphocytes Relative: 31 %
Lymphs Abs: 2.1 10*3/uL (ref 0.7–4.0)
MCH: 24.8 pg — ABNORMAL LOW (ref 26.0–34.0)
MCHC: 33.1 g/dL (ref 30.0–36.0)
MCV: 74.8 fL — ABNORMAL LOW (ref 80.0–100.0)
Monocytes Absolute: 0.9 10*3/uL (ref 0.1–1.0)
Monocytes Relative: 14 %
Neutro Abs: 3.5 10*3/uL (ref 1.7–7.7)
Neutrophils Relative %: 51 %
Platelets: 232 10*3/uL (ref 150–400)
RBC: 4.8 MIL/uL (ref 3.87–5.11)
RDW: 15.9 % — ABNORMAL HIGH (ref 11.5–15.5)
WBC: 6.8 10*3/uL (ref 4.0–10.5)
nRBC: 0 % (ref 0.0–0.2)

## 2021-04-03 LAB — RESP PANEL BY RT-PCR (FLU A&B, COVID) ARPGX2
Influenza A by PCR: NEGATIVE
Influenza B by PCR: NEGATIVE
SARS Coronavirus 2 by RT PCR: NEGATIVE

## 2021-04-03 LAB — COMPREHENSIVE METABOLIC PANEL
ALT: 43 U/L (ref 0–44)
AST: 40 U/L (ref 15–41)
Albumin: 4 g/dL (ref 3.5–5.0)
Alkaline Phosphatase: 76 U/L (ref 38–126)
Anion gap: 10 (ref 5–15)
BUN: 18 mg/dL (ref 8–23)
CO2: 24 mmol/L (ref 22–32)
Calcium: 9.1 mg/dL (ref 8.9–10.3)
Chloride: 93 mmol/L — ABNORMAL LOW (ref 98–111)
Creatinine, Ser: 0.73 mg/dL (ref 0.44–1.00)
GFR, Estimated: >60 mL/min (ref >60)
Glucose, Bld: 138 mg/dL — ABNORMAL HIGH (ref 70–99)
Potassium: 4.2 mmol/L (ref 3.5–5.1)
Sodium: 127 mmol/L — ABNORMAL LOW (ref 135–145)
Total Bilirubin: 0.7 mg/dL (ref 0.3–1.2)
Total Protein: 7.2 g/dL (ref 6.5–8.1)

## 2021-04-03 LAB — MAGNESIUM: Magnesium: 1.5 mg/dL — ABNORMAL LOW (ref 1.7–2.4)

## 2021-04-03 LAB — HEMOGLOBIN A1C
Hgb A1c MFr Bld: 7.2 % — ABNORMAL HIGH (ref 4.8–5.6)
Mean Plasma Glucose: 160 mg/dL

## 2021-04-03 LAB — HIV ANTIBODY (ROUTINE TESTING W REFLEX): HIV Screen 4th Generation wRfx: NONREACTIVE

## 2021-04-03 LAB — GLUCOSE, CAPILLARY: Glucose-Capillary: 129 mg/dL — ABNORMAL HIGH (ref 70–99)

## 2021-04-03 MED ORDER — NAPROXEN SODIUM 220 MG PO CAPS: 440.0000 mg | ORAL_CAPSULE | Freq: Two times a day (BID) | ORAL | 0 refills | Status: AC | PRN

## 2021-04-03 MED ORDER — ACETAMINOPHEN 325 MG PO TABS: 650.0000 mg | ORAL_TABLET | Freq: Four times a day (QID) | ORAL | Status: DC | PRN

## 2021-04-03 MED ORDER — ONDANSETRON HCL 4 MG/2ML IJ SOLN: 4.0000 mg | Freq: Four times a day (QID) | INTRAMUSCULAR | Status: DC | PRN

## 2021-04-03 MED ORDER — ACETAMINOPHEN 500 MG PO TABS: 1000.0000 mg | ORAL_TABLET | Freq: Three times a day (TID) | ORAL | 1 refills | Status: AC

## 2021-04-03 MED ORDER — MAGNESIUM SULFATE 2 GM/50ML IV SOLN
2.0000 g | Freq: Once | INTRAVENOUS | Status: AC
  Administered 2021-04-03: 2 g via INTRAVENOUS
  Filled 2021-04-03: qty 50

## 2021-04-03 MED ORDER — ADULT MULTIVITAMIN W/MINERALS CH
1.0000 | ORAL_TABLET | Freq: Every day | ORAL | Status: DC
  Administered 2021-04-03: 1 via ORAL
  Filled 2021-04-03: qty 1

## 2021-04-03 MED ORDER — FENTANYL CITRATE (PF) 100 MCG/2ML IJ SOLN
50.0000 ug | Freq: Once | INTRAMUSCULAR | Status: AC
  Administered 2021-04-03: 50 ug via INTRAVENOUS
  Filled 2021-04-03: qty 2

## 2021-04-03 MED ORDER — INSULIN ASPART 100 UNIT/ML IJ SOLN: 0.0000 [IU] | Freq: Every day | INTRAMUSCULAR | Status: DC

## 2021-04-03 MED ORDER — LORAZEPAM 2 MG/ML IJ SOLN
1.0000 mg | INTRAMUSCULAR | Status: DC | PRN
Start: 2021-04-03 — End: 2021-04-03

## 2021-04-03 MED ORDER — THIAMINE HCL 100 MG/ML IJ SOLN: 100.0000 mg | Freq: Every day | INTRAMUSCULAR | Status: DC

## 2021-04-03 MED ORDER — MORPHINE SULFATE (PF) 2 MG/ML IV SOLN
2.0000 mg | INTRAVENOUS | Status: DC | PRN
Start: 2021-04-03 — End: 2021-04-03

## 2021-04-03 MED ORDER — HYDROCODONE-ACETAMINOPHEN 5-325 MG PO TABS
1.0000 | ORAL_TABLET | ORAL | Status: DC | PRN
  Administered 2021-04-03: 1 via ORAL
  Filled 2021-04-03: qty 1

## 2021-04-03 MED ORDER — FOLIC ACID 1 MG PO TABS
1.0000 mg | ORAL_TABLET | Freq: Every day | ORAL | Status: DC
  Administered 2021-04-03: 1 mg via ORAL
  Filled 2021-04-03: qty 1

## 2021-04-03 MED ORDER — LORAZEPAM 1 MG PO TABS
1.0000 mg | ORAL_TABLET | ORAL | Status: DC | PRN
Start: 2021-04-03 — End: 2021-04-03

## 2021-04-03 MED ORDER — OXYCODONE HCL 5 MG PO TABS
5.0000 mg | ORAL_TABLET | Freq: Once | ORAL | Status: AC
Start: 2021-04-03 — End: 2021-04-03
  Administered 2021-04-03: 5 mg via ORAL
  Filled 2021-04-03: qty 1

## 2021-04-03 MED ORDER — LIDOCAINE 5 % EX PTCH
1.0000 | MEDICATED_PATCH | CUTANEOUS | Status: DC
  Administered 2021-04-03: 1 via TRANSDERMAL
  Filled 2021-04-03: qty 1

## 2021-04-03 MED ORDER — OXYCODONE HCL 5 MG PO TABS: 5.0000 mg | ORAL_TABLET | Freq: Four times a day (QID) | ORAL | 0 refills | Status: AC | PRN

## 2021-04-03 MED ORDER — THIAMINE HCL 100 MG PO TABS
100.0000 mg | ORAL_TABLET | Freq: Every day | ORAL | Status: DC
  Administered 2021-04-03: 100 mg via ORAL
  Filled 2021-04-03: qty 1

## 2021-04-03 MED ORDER — ONDANSETRON HCL 4 MG PO TABS: 4.0000 mg | ORAL_TABLET | Freq: Four times a day (QID) | ORAL | Status: DC | PRN

## 2021-04-03 MED ORDER — ACETAMINOPHEN 650 MG RE SUPP: 650.0000 mg | Freq: Four times a day (QID) | RECTAL | Status: DC | PRN

## 2021-04-03 MED ORDER — ENOXAPARIN SODIUM 60 MG/0.6ML IJ SOSY: 0.5000 mg/kg | PREFILLED_SYRINGE | INTRAMUSCULAR | Status: DC

## 2021-04-03 MED ORDER — ACETAMINOPHEN 500 MG PO TABS
1000.0000 mg | ORAL_TABLET | Freq: Once | ORAL | Status: AC
  Administered 2021-04-03: 1000 mg via ORAL
  Filled 2021-04-03: qty 2

## 2021-04-03 MED ORDER — INSULIN ASPART 100 UNIT/ML IJ SOLN
0.0000 [IU] | Freq: Three times a day (TID) | INTRAMUSCULAR | Status: DC
  Administered 2021-04-03: 2 [IU] via SUBCUTANEOUS
  Filled 2021-04-03: qty 1

## 2021-04-03 NOTE — ED Triage Notes (Addendum)
Pt states she fell off a porch injuring her left hip tonight. Pt states she landed on buttocks and did not striker her thorax or chest.  Pt states after falling she became short of breath. Pt able to speak in full sentences without difficulty. No resp distress noted.

## 2021-04-03 NOTE — TOC Transition Note (Signed)
Transition of Care [facility]) - CM/SW Discharge Note   Patient Details  Name: Caila Cirelli MRN: [id number] Date of Birth: Aug 07, 1951  Transition of Care [facility]) CM/SW Contact:  Caryn Section, RN Phone Number: 04/03/2021, 2:05 PM   Clinical Narrative:   Patient lives at home with spouse, who can assist if needed.  No concerns about returning home, no concerns about getting to appointments or getting/taking medications.  HH arranged for patient with [facility], confirmed by liaison.  3n1 delivered by [employer].  Care RN confirms patient is ready for discharge.          Patient Goals and CMS Choice        Discharge Placement                       Discharge Plan and Services                                     Social Determinants of Health (SDOH) Interventions     Readmission Risk Interventions No flowsheet data found.

## 2021-04-03 NOTE — ED Notes (Signed)
Attempted to ambulate patient. Patient able to take approx 4 steps before needing to sit back down. Patient reports severe L hip pain with ambulation. Assisted to bathroom in wheelchair.

## 2021-04-03 NOTE — Progress Notes (Signed)
Pt discharged home with all instructions provided and questions answered.   BP (!) 164/70 (BP Location: Right Arm)   Pulse 84   Temp 97.6 F (36.4 C)   Resp 18   Ht 5\' 2"  (1.575 m)   Wt 97.5 kg   SpO2 97%   BMI 39.32 kg/m

## 2021-04-03 NOTE — H&P (Signed)
History and Physical    Betty Luna FAO:[id number] DOB: [birth year] DOA: 04/03/2021  PCP: Ethelda Chick, MD   Patient coming from: home  I have personally briefly reviewed patient's old medical records in [facility]  Chief Complaint: fall  HPI: Betty Luna is a 70 y.o. female with medical history significant for Chronic systolic heart failure EF 40%, cardiomyopathy hypertension, diabetes, obesity, gout who presented to the emergency room following an accidental fall onto her left buttock, after she misstepped while stepping out onto the porch while at a family gathering.  She was able to stand and bear weight after the fall but the pain has become progressively worse prompting the visit to the emergency room.  She had no other injuries, did not hit her head and had no loss of consciousness.  She was previously in her usual state of health and denied any recent cough, shortness of breath, fever or chills, abdominal pain, nausea or vomiting, diarrhea or dysuria. Patient admits to drinking on weekends up to 12 pack of beer and states she had about 9 beers at a party ED course: On arrival, borderline low blood pressure of 106/55 with otherwise normal vitals.  Blood work pending at the time of admission EKG, personally viewed and interpreted: NSR at 84 with nonspecific ST-T wave changes with prolonged QT of 432 Imaging: CT pelvis with no acute fracture or traumatic malalignment of the bony pelvis.  Mild stranding likely contusive changes posterolateral hip  Patient treated with pain meds in the emergency room but continued to have severe pain with attempts at ambulation.  Admission requested for pain control  Review of Systems: As per HPI otherwise all other systems on review of systems negative.    Past Medical History:  Diagnosis Date   Anemia    Arthritis    CHF (congestive heart failure) (HCC)    Depression    Diabetes mellitus without complication (HCC)    GERD  (gastroesophageal reflux disease)    Gout    Hypertension     Past Surgical History:  Procedure Laterality Date   ABDOMINAL HYSTERECTOMY     CATARACT EXTRACTION W/PHACO Right 04/16/2017   Procedure: CATARACT EXTRACTION PHACO AND INTRAOCULAR LENS PLACEMENT (IOC);  Surgeon: Galen Manila, MD;  Location: [facility] ORS;  Service: Ophthalmology;  Laterality: Right;  [place] 01:06 AP% 16.5 CDE 11.03 Fluid pack lot # [id number]   CATARACT EXTRACTION W/PHACO Left 10/01/2017   Procedure: CATARACT EXTRACTION PHACO AND INTRAOCULAR LENS PLACEMENT (IOC)-LEFT DIABETIC;  Surgeon: Galen Manila, MD;  Location: [facility] ORS;  Service: Ophthalmology;  Laterality: Left;  [place] 01:05.7 AP% 12.2 CDE 7.42 Fluid pack lot # [id number]   CTR Left    TONSILLECTOMY       reports that she has quit smoking. Her smokeless tobacco use includes snuff. She reports current alcohol use. She reports that she does not use drugs.  Allergies  Allergen Reactions   Omeprazole Other (See Comments)    CHEST PAIN    Tramadol Nausea Only and Nausea And Vomiting   Atorvastatin Other (See Comments)    And another statin too   Codeine Other (See Comments) and Anxiety    Reaction: jittery  Jittery    Family History  Problem Relation Age of Onset   Alcohol abuse Brother    Breast cancer Sister       Prior to Admission medications   Medication Sig Start Date End Date Taking? Authorizing Provider  allopurinol (ZYLOPRIM) 100 MG tablet Take 100 mg  by mouth daily. 03/01/20   [provider]  aspirin EC 81 MG tablet Take 81 mg by mouth daily.    [provider]  carvedilol (COREG) 25 MG tablet Take 25 mg by mouth 2 (two) times daily.  02/14/15   [provider]  colchicine 0.6 MG tablet Take 0.6 mg by mouth daily.    [provider]  DULoxetine (CYMBALTA) 60 MG capsule Take 60 mg by mouth daily. 03/01/20   [provider]  losartan (COZAAR) 50 MG tablet Take 50 mg by mouth daily.  01/23/15    [provider]  metFORMIN (GLUCOPHAGE-XR) 500 MG 24 hr tablet Take 1,000 mg by mouth 2 (two) times daily.     [provider]  methocarbamol (ROBAXIN) 500 MG tablet Take 1 tablet (500 mg total) by mouth 4 (four) times daily. 06/18/20   Cuthriell, Delorise Royals, PA-C  ondansetron (ZOFRAN-ODT) 4 MG disintegrating tablet Take 1 tablet (4 mg total) by mouth every 8 (eight) hours as needed for nausea or vomiting. 06/18/20   Cuthriell, Delorise Royals, PA-C  pantoprazole (PROTONIX) 40 MG tablet Take 40 mg by mouth daily. 03/07/17   [provider]  predniSONE (DELTASONE) 50 MG tablet Take 1 tablet (50 mg total) by mouth daily with breakfast. 06/18/20   Cuthriell, Delorise Royals, PA-C  torsemide (DEMADEX) 10 MG tablet Take 10 mg by mouth daily.  03/23/15   [provider]  traMADol (ULTRAM) 50 MG tablet Take 1 tablet (50 mg total) by mouth every 6 (six) hours as needed. 06/18/20   Cuthriell, Delorise Royals, PA-C  Vitamin D, Ergocalciferol, (DRISDOL) 1.25 MG (50000 UNIT) CAPS capsule Take 50,000 Units by mouth once a week. 02/25/20   [provider]    Physical Exam: Vitals:   04/03/21 0200 04/03/21 0230 04/03/21 0300 04/03/21 0440  BP: 129/77 112/63 95/61 128/75  Pulse: 81 82 83 81  Resp: 20  18 18   Temp:      TempSrc:      SpO2: 94% 97% 94% 94%  Weight:      Height:         Vitals:   04/03/21 0200 04/03/21 0230 04/03/21 0300 04/03/21 0440  BP: 129/77 112/63 95/61 128/75  Pulse: 81 82 83 81  Resp: 20  18 18   Temp:      TempSrc:      SpO2: 94% 97% 94% 94%  Weight:      Height:          Constitutional: Alert and oriented x 3 . Not in any apparent distress HEENT:      Head: Normocephalic and atraumatic.         Eyes: PERLA, EOMI, Conjunctivae are normal. Sclera is non-icteric.       Mouth/Throat: Mucous membranes are moist.       Neck: Supple with no signs of meningismus. Cardiovascular: Regular rate and rhythm. No murmurs, gallops, or rubs. 2+ symmetrical  distal pulses are present . No JVD. No LE edema Respiratory: Respiratory effort normal .Lungs sounds clear bilaterally. No wheezes, crackles, or rhonchi.  Gastrointestinal: Soft, non tender, and non distended with positive bowel sounds.  Genitourinary: No CVA tenderness. Musculoskeletal: Tender on palpation posterior left hip/buttock.  No bruising noted.  Otherwise full range of motion in other extremities. No cyanosis, or erythema of extremities. Neurologic:  Face is symmetric. Moving all extremities. No gross focal neurologic deficits . Skin: Skin is warm, dry.  No rash or ulcers Psychiatric: Mood and affect  are normal    Labs on Admission: I have personally reviewed following labs and imaging studies  CBC: No results for input(s): WBC, NEUTROABS, HGB, HCT, MCV, PLT in the last 168 hours. Basic Metabolic Panel: No results for input(s): NA, K, CL, CO2, GLUCOSE, BUN, CREATININE, CALCIUM, MG, PHOS in the last 168 hours. GFR: CrCl cannot be calculated (Patient's most recent lab result is older than the maximum 21 days allowed.). Liver Function Tests: No results for input(s): AST, ALT, ALKPHOS, BILITOT, PROT, ALBUMIN in the last 168 hours. No results for input(s): LIPASE, AMYLASE in the last 168 hours. No results for input(s): AMMONIA in the last 168 hours. Coagulation Profile: No results for input(s): INR, PROTIME in the last 168 hours. Cardiac Enzymes: No results for input(s): CKTOTAL, CKMB, CKMBINDEX, TROPONINI in the last 168 hours. BNP (last 3 results) No results for input(s): PROBNP in the last 8760 hours. HbA1C: No results for input(s): HGBA1C in the last 72 hours. CBG: No results for input(s): GLUCAP in the last 168 hours. Lipid Profile: No results for input(s): CHOL, HDL, LDLCALC, TRIG, CHOLHDL, LDLDIRECT in the last 72 hours. Thyroid Function Tests: No results for input(s): TSH, T4TOTAL, FREET4, T3FREE, THYROIDAB in the last 72 hours. Anemia Panel: No results for input(s):  VITAMINB12, FOLATE, FERRITIN, TIBC, IRON, RETICCTPCT in the last 72 hours. Urine analysis:    Component Value Date/Time   COLORURINE COLORLESS (A) 12/29/2020 1420   APPEARANCEUR CLEAR (A) 12/29/2020 1420   APPEARANCEUR Hazy 09/18/2013 0828   LABSPEC 1.005 12/29/2020 1420   LABSPEC 1.013 09/18/2013 0828   PHURINE 5.0 12/29/2020 1420   GLUCOSEU NEGATIVE 12/29/2020 1420   GLUCOSEU Negative 09/18/2013 0828   HGBUR NEGATIVE 12/29/2020 1420   BILIRUBINUR NEGATIVE 12/29/2020 1420   BILIRUBINUR Negative 09/18/2013 0828   KETONESUR NEGATIVE 12/29/2020 1420   PROTEINUR NEGATIVE 12/29/2020 1420   NITRITE NEGATIVE 12/29/2020 1420   LEUKOCYTESUR NEGATIVE 12/29/2020 1420   LEUKOCYTESUR 1+ 09/18/2013 0828    Radiological Exams on Admission: CT PELVIS WO CONTRAST  Result Date: 04/03/2021 CLINICAL DATA:  Fall, left hip pain EXAM: CT PELVIS WITHOUT CONTRAST TECHNIQUE: Multidetector CT imaging of the pelvis was performed following the standard protocol without intravenous contrast. COMPARISON:  Radiograph 04/03/2021, CT 12/28/2017 FINDINGS: Urinary Tract: Circumferential bladder wall thickening may be related to underdistention though could correlate for urinary symptoms. Distal ureters are unremarkable. Bowel: No conspicuous large or small bowel thickening. No evidence of obstruction. Scattered colonic diverticula without focal inflammation to suggest diverticulitis. Normal appendix in the right lower quadrant. Vascular/Lymphatic: Atherosclerotic calcifications within the distal abdominal aorta and branch vessels. No aneurysm or ectasia. No enlarged pelvic lymph nodes. Reproductive: Uterus is surgically absent. No concerning adnexal lesions. Other: No abdominopelvic free fluid or free gas. No bowel containing hernias. Mild stranding and contusive changes along the posterolateral left hip. No large hematoma. Musculoskeletal: No visible vertebral body fracture or height loss within the margins of imaging. Bones  of the pelvis and sacrum appear intact and congruent. Proximal femora are intact and normally located. Discogenic and facet degenerative changes present in lumbar levels as included. Additional degenerative features noted in the pubic symphysis and bilateral SI joints including partial bony fusion across the superior joint spaces. Proximal femora are intact and normally located within the hips. Mild-to-moderate bilateral hip arthrosis with periacetabular spurring. Enthesopathic changes are noted about the hips and pelvis including iliac crest, anterosuperior iliac spine and bilateral greater trochanters and along the ischial tuberosities. Musculature appears normal and symmetric without clear  avulsive change or intramuscular hematoma. IMPRESSION: 1. No acute fracture or traumatic malalignment of the bony pelvis. 2. Mild stranding likely contusive changes, of the posterolateral left hip. 3. Degenerative changes of the lower lumbar levels, SI joints and bilateral hips, as above. 4. Diverticulosis without evidence of acute diverticulitis. 5.  Aortic Atherosclerosis (ICD10-I70.0). Electronically Signed   By: Kreg Shropshire M.D.   On: 04/03/2021 04:08   DG Hip Unilat With Pelvis 2-3 Views Left  Result Date: 04/03/2021 CLINICAL DATA:  Fall, left hip pain EXAM: DG HIP (WITH OR WITHOUT PELVIS) 2-3V LEFT COMPARISON:  None. FINDINGS: Bones of the pelvis appear intact and congruent. Proximal femora intact and normally located. Degenerative changes noted in the lower lumbar spine and both SI joints. Mild-to-moderate degenerative changes in the bilateral hips. Enthesopathic changes about the pelvis and proximal femora as well. The osseous structures appear diffusely demineralized which may limit detection of small or nondisplaced fractures. No suspicious lytic or blastic lesions. Lateral left hip swelling, could reflect hematoma or contusive change. No soft tissue gas or foreign body. Vascular calcium in the soft tissues.  Normal bowel gas pattern. Soft tissues otherwise unremarkable. IMPRESSION: No acute fracture or traumatic osseous injury. Lateral left hip swelling, correlate for contusive change or hematoma. Degenerative changes in the spine, hips and pelvis. Electronically Signed   By: Kreg Shropshire M.D.   On: 04/03/2021 01:10     Assessment/Plan 70 year old female with history of chronic systolic heart failure EF 40%, cardiomyopathy hypertension, diabetes, obesity, gout with intractable pain following an accidental fall onto her left buttock    Contusion of left hip from accidental fall -CT pelvis with no acute fracture or traumatic malalignment of the bony pelvis.  Mild stranding likely contusive changes posterolateral hip - Pain control - Follow labs/renal function to evaluate safety to give NSAIDs - PT eval    Type 2 diabetes mellitus  - Sliding scale insulin coverage resume home insulin pending med rec    HTN (hypertension), benign - Hold antihypertensives due to soft BPs in the ED of 106/55    Chronic systolic CHF (congestive heart failure) (HCC)   Cardiomyopathy (HCC) - Euvolemic, not acutely exacerbated - Hold Coreg torsemide and losartan due to soft blood pressures and resume as appropriate  Alcohol use/weekend binge - Patient admits to drinking 12 pack of beer on weekends.Abstains during the weekdays - Counseled on cutting back - CIWA not initiated at this time    DVT prophylaxis: Lovenox  Code Status: full code  Family Communication:  none  Disposition Plan: Back to previous home environment Consults called: none  Status: Observation    Andris Baumann MD [facility]     04/03/2021, 5:15 AM

## 2021-04-03 NOTE — Consult Note (Signed)
Reason for Consult: Right foot fracture Referring Physician: Ludwig Lean, MD  Honour Schwieger is an 70 y.o. female.  HPI: She fell while descending stairs and injured her left hip and right foot, been very sore.  Had x-rays done and CAM boot was already brought  Past Medical History:  Diagnosis Date   Anemia    Arthritis    CHF (congestive heart failure) (HCC)    Depression    Diabetes mellitus without complication (HCC)    GERD (gastroesophageal reflux disease)    Gout    Hypertension     Past Surgical History:  Procedure Laterality Date   ABDOMINAL HYSTERECTOMY     CATARACT EXTRACTION W/PHACO Right 04/16/2017   Procedure: CATARACT EXTRACTION PHACO AND INTRAOCULAR LENS PLACEMENT (IOC);  Surgeon: Galen Manila, MD;  Location: [facility] ORS;  Service: Ophthalmology;  Laterality: Right;  [place] 01:06 AP% 16.5 CDE 11.03 Fluid pack lot # [id number]   CATARACT EXTRACTION W/PHACO Left 10/01/2017   Procedure: CATARACT EXTRACTION PHACO AND INTRAOCULAR LENS PLACEMENT (IOC)-LEFT DIABETIC;  Surgeon: Galen Manila, MD;  Location: [facility] ORS;  Service: Ophthalmology;  Laterality: Left;  [place] 01:05.7 AP% 12.2 CDE 7.42 Fluid pack lot # [id number]   CTR Left    TONSILLECTOMY      Family History  Problem Relation Age of Onset   Alcohol abuse Brother    Breast cancer Sister     Social History:  reports that she has quit smoking. Her smokeless tobacco use includes snuff. She reports current alcohol use. She reports that she does not use drugs.  Allergies:  Allergies  Allergen Reactions   Omeprazole Other (See Comments)    CHEST PAIN    Tramadol Nausea Only and Nausea And Vomiting   Atorvastatin Other (See Comments)    And another statin too   Codeine Other (See Comments) and Anxiety    Reaction: jittery  Jittery    Medications: I have reviewed the patient's current medications.  Results for orders placed or performed during the hospital encounter of 04/03/21 (from the past 48 hour(s))   Resp Panel by RT-PCR (Flu A&B, Covid) Nasopharyngeal Swab     Status: None   Collection Time: 04/03/21  4:43 AM   Specimen: Nasopharyngeal Swab; Nasopharyngeal(NP) swabs in vial transport medium  Result Value Ref Range   SARS Coronavirus 2 by RT PCR NEGATIVE NEGATIVE    Comment: (NOTE) SARS-CoV-2 target nucleic acids are NOT DETECTED.  The SARS-CoV-2 RNA is generally detectable in upper respiratory specimens during the acute phase of infection. The lowest concentration of SARS-CoV-2 viral copies this assay can detect is 138 copies/mL. A negative result does not preclude SARS-Cov-2 infection and should not be used as the sole basis for treatment or other patient management decisions. A negative result may occur with  improper specimen collection/handling, submission of specimen other than nasopharyngeal swab, presence of viral mutation(s) within the areas targeted by this assay, and inadequate number of viral copies(<138 copies/mL). A negative result must be combined with clinical observations, patient history, and epidemiological information. The expected result is Negative.  Fact Sheet for Patients:  BloggerCourse.com  Fact Sheet for Healthcare Providers:  SeriousBroker.it  This test is no t yet approved or cleared by the [place] FDA and  has been authorized for detection and/or diagnosis of SARS-CoV-2 by FDA under an Emergency Use Authorization (EUA). This EUA will remain  in effect (meaning this test can be used) for the duration of the COVID-19 declaration under Section 564(b)(1) of the  Act, 21 U.S.C.section 360bbb-3(b)(1), unless the authorization is terminated  or revoked sooner.       Influenza A by PCR NEGATIVE NEGATIVE   Influenza B by PCR NEGATIVE NEGATIVE    Comment: (NOTE) The Xpert Xpress SARS-CoV-2/FLU/RSV plus assay is intended as an aid in the diagnosis of influenza from Nasopharyngeal swab specimens  and should not be used as a sole basis for treatment. Nasal washings and aspirates are unacceptable for Xpert Xpress SARS-CoV-2/FLU/RSV testing.  Fact Sheet for Patients: BloggerCourse.com  Fact Sheet for Healthcare Providers: SeriousBroker.it  This test is not yet approved or cleared by the [place] FDA and has been authorized for detection and/or diagnosis of SARS-CoV-2 by FDA under an Emergency Use Authorization (EUA). This EUA will remain in effect (meaning this test can be used) for the duration of the COVID-19 declaration under Section 564(b)(1) of the Act, 21 U.S.C. section 360bbb-3(b)(1), unless the authorization is terminated or revoked.  Performed at [facility], [address] [place], [place] [address]   Comprehensive metabolic panel     Status: Abnormal   Collection Time: 04/03/21  6:01 AM  Result Value Ref Range   Sodium 127 (L) 135 - 145 mmol/L   Potassium 4.2 3.5 - 5.1 mmol/L   Chloride 93 (L) 98 - 111 mmol/L   CO2 24 22 - 32 mmol/L   Glucose, Bld 138 (H) 70 - 99 mg/dL    Comment: Glucose reference range applies only to samples taken after fasting for at least 8 hours.   BUN 18 8 - 23 mg/dL   Creatinine, Ser 6.50 0.44 - 1.00 mg/dL   Calcium 9.1 8.9 - 35.4 mg/dL   Total Protein 7.2 6.5 - 8.1 g/dL   Albumin 4.0 3.5 - 5.0 g/dL   AST 40 15 - 41 U/L   ALT 43 0 - 44 U/L   Alkaline Phosphatase 76 38 - 126 U/L   Total Bilirubin 0.7 0.3 - 1.2 mg/dL   GFR, Estimated >65 >68 mL/min    Comment: (NOTE) Calculated using the CKD-EPI Creatinine Equation (2021)    Anion gap 10 5 - 15    Comment: Performed at [facility], [address] [place], [place] [address]  CBC WITH DIFFERENTIAL     Status: Abnormal   Collection Time: 04/03/21  6:01 AM  Result Value Ref Range   WBC 6.8 4.0 - 10.5 K/uL   RBC 4.80 3.87 - 5.11 MIL/uL   Hemoglobin 11.9 (L) 12.0 - 15.0 g/dL   HCT 70.0 (L) 17.4 - 94.4 %    MCV 74.8 (L) 80.0 - 100.0 fL   MCH 24.8 (L) 26.0 - 34.0 pg   MCHC 33.1 30.0 - 36.0 g/dL   RDW 96.7 (H) 59.1 - 63.8 %   Platelets 232 150 - 400 K/uL   nRBC 0.0 0.0 - 0.2 %   Neutrophils Relative % 51 %   Neutro Abs 3.5 1.7 - 7.7 K/uL   Lymphocytes Relative 31 %   Lymphs Abs 2.1 0.7 - 4.0 K/uL   Monocytes Relative 14 %   Monocytes Absolute 0.9 0.1 - 1.0 K/uL   Eosinophils Relative 3 %   Eosinophils Absolute 0.2 0.0 - 0.5 K/uL   Basophils Relative 0 %   Basophils Absolute 0.0 0.0 - 0.1 K/uL   Immature Granulocytes 1 %   Abs Immature Granulocytes 0.04 0.00 - 0.07 K/uL    Comment: Performed at [facility], [address] [place], [place] [address]  Magnesium     Status: Abnormal   Collection Time: 04/03/21  6:01 AM  Result Value Ref Range   Magnesium 1.5 (L) 1.7 - 2.4 mg/dL    Comment: Performed at [facility], [address] [place], [place] [address]  Glucose, capillary     Status: Abnormal   Collection Time: 04/03/21  8:15 AM  Result Value Ref Range   Glucose-Capillary 129 (H) 70 - 99 mg/dL    Comment: Glucose reference range applies only to samples taken after fasting for at least 8 hours.    CT PELVIS WO CONTRAST  Result Date: 04/03/2021 CLINICAL DATA:  Fall, left hip pain EXAM: CT PELVIS WITHOUT CONTRAST TECHNIQUE: Multidetector CT imaging of the pelvis was performed following the standard protocol without intravenous contrast. COMPARISON:  Radiograph 04/03/2021, CT 12/28/2017 FINDINGS: Urinary Tract: Circumferential bladder wall thickening may be related to underdistention though could correlate for urinary symptoms. Distal ureters are unremarkable. Bowel: No conspicuous large or small bowel thickening. No evidence of obstruction. Scattered colonic diverticula without focal inflammation to suggest diverticulitis. Normal appendix in the right lower quadrant. Vascular/Lymphatic: Atherosclerotic calcifications within the distal abdominal aorta and branch  vessels. No aneurysm or ectasia. No enlarged pelvic lymph nodes. Reproductive: Uterus is surgically absent. No concerning adnexal lesions. Other: No abdominopelvic free fluid or free gas. No bowel containing hernias. Mild stranding and contusive changes along the posterolateral left hip. No large hematoma. Musculoskeletal: No visible vertebral body fracture or height loss within the margins of imaging. Bones of the pelvis and sacrum appear intact and congruent. Proximal femora are intact and normally located. Discogenic and facet degenerative changes present in lumbar levels as included. Additional degenerative features noted in the pubic symphysis and bilateral SI joints including partial bony fusion across the superior joint spaces. Proximal femora are intact and normally located within the hips. Mild-to-moderate bilateral hip arthrosis with periacetabular spurring. Enthesopathic changes are noted about the hips and pelvis including iliac crest, anterosuperior iliac spine and bilateral greater trochanters and along the ischial tuberosities. Musculature appears normal and symmetric without clear avulsive change or intramuscular hematoma. IMPRESSION: 1. No acute fracture or traumatic malalignment of the bony pelvis. 2. Mild stranding likely contusive changes, of the posterolateral left hip. 3. Degenerative changes of the lower lumbar levels, SI joints and bilateral hips, as above. 4. Diverticulosis without evidence of acute diverticulitis. 5.  Aortic Atherosclerosis (ICD10-I70.0). Electronically Signed   By: Kreg Shropshire M.D.   On: 04/03/2021 04:08   DG Foot 2 Views Right  Result Date: 04/03/2021 CLINICAL DATA:  Right foot pain EXAM: RIGHT FOOT - 2 VIEW COMPARISON:  None. FINDINGS: Acute appearing transversely oriented fracture through the base of the fifth metatarsal with adjacent soft tissue swelling. Possible longitudinal fracture extension into the articular surface of the proximal metatarsal as well. No  other acute fracture or traumatic malalignment. Findings on a background of diffuse mild to moderate degenerative changes throughout the foot and imaged ankle. Bidirectional calcaneal spurs are noted as well. IMPRESSION: Fracture of the base of the fifth metatarsal with a predominantly transverse fracture line albeit with some longitudinal extension to the articular surface. Associated soft tissue swelling. Additional degenerative changes throughout the foot and ankle. Bidirectional calcaneal spurs. Electronically Signed   By: Kreg Shropshire M.D.   On: 04/03/2021 06:15   DG Hip Unilat With Pelvis 2-3 Views Left  Result Date: 04/03/2021 CLINICAL DATA:  Fall, left hip pain EXAM: DG HIP (WITH OR WITHOUT PELVIS) 2-3V LEFT COMPARISON:  None. FINDINGS: Bones of the pelvis appear intact and congruent. Proximal femora intact and normally located. Degenerative changes noted in the lower lumbar spine and both SI joints. Mild-to-moderate degenerative changes in the bilateral hips. Enthesopathic changes about the pelvis and proximal femora as well. The osseous structures appear diffusely demineralized which may limit detection of small or nondisplaced fractures. No suspicious lytic or blastic lesions. Lateral left hip swelling, could reflect hematoma or contusive change. No soft tissue gas or foreign body. Vascular calcium in the soft tissues. Normal bowel gas pattern. Soft tissues otherwise unremarkable. IMPRESSION: No acute fracture or traumatic osseous injury. Lateral left hip swelling, correlate for contusive change or hematoma. Degenerative changes in the spine, hips and pelvis. Electronically Signed   By: Kreg Shropshire M.D.   On: 04/03/2021 01:10    ROS Blood pressure (!) 164/70, pulse 84, temperature 97.6 F (36.4 C), resp. rate 18, height 5\' 2"  (1.575 m), weight 97.5 kg, SpO2 97 %.  Vitals:   04/03/21 0828 04/03/21 1212  BP: 138/66 (!) 164/70  Pulse: 84 84  Resp: 18 18  Temp: 97.8 F (36.6 C) 97.6 F (36.4  C)  SpO2: 96% 97%    General AA&O x3. Normal mood and affect.  Vascular Dorsalis pedis and posterior tibial pulses  present 1+ bilaterally  Capillary refill normal to all digits.  Neurologic Epicritic sensation grossly present.  Dermatologic (Wound) Normal turgor and texture  Orthopedic: Motor intact BLE.  Pain on fifth metatarsal base    Assessment/Plan:  Right fifth metatarsal pseudo Jones fracture -Imaging: Studies independently reviewed -WB Status: WBAT in CAM boot -No surgical plans -Follow-up with me in 1 month for new x-rays Edwin Cap 04/03/2021, 1:05 PM   Best available via secure chat for questions or concerns.

## 2021-04-03 NOTE — ED Provider Notes (Signed)
[facility] Emergency Department Provider Note  ____________________________________________  Time seen: Approximately 3:21 AM  I have reviewed the triage vital signs and the nursing notes.   HISTORY  Chief Complaint Fall   HPI Betty Luna is a 70 y.o. female with a history of anemia, CHF, diabetes, hypertension, gout who presents for evaluation of a mechanical fall.  Patient reports that she was at a family member's house.  They were having a party.  Patient reports that she was having a great time.  She stepped out on the porch and missed a step.  She fell onto her buttock.  She is complaining of left hip pain.  Reports no head trauma or LOC.  Does not take any blood thinners.  She denies neck pain or back pain, chest pain or abdominal pain.  She was able to stand and bear weight after the fall but is complaining of moderate sharp pain in the posterior aspect of her left hip.   Past Medical History:  Diagnosis Date   Anemia    Arthritis    CHF (congestive heart failure) (HCC)    Depression    Diabetes mellitus without complication (HCC)    GERD (gastroesophageal reflux disease)    Gout    Hypertension     Patient Active Problem List   Diagnosis Date Noted   Congestive heart failure, unspecified HF chronicity, unspecified heart failure type (HCC) 12/29/2020   Type 2 diabetes mellitus without complication, with long-term current use of insulin (HCC) 12/29/2020   Shortness of breath 06/22/2019   Uncontrolled hypertension 06/22/2019   SOB (shortness of breath) 06/22/2019   Pyelonephritis 06/26/2017    Past Surgical History:  Procedure Laterality Date   ABDOMINAL HYSTERECTOMY     CATARACT EXTRACTION W/PHACO Right 04/16/2017   Procedure: CATARACT EXTRACTION PHACO AND INTRAOCULAR LENS PLACEMENT (IOC);  Surgeon: Galen Manila, MD;  Location: [facility] ORS;  Service: Ophthalmology;  Laterality: Right;  [place] 01:06 AP% 16.5 CDE 11.03 Fluid pack lot #  [id number]   CATARACT EXTRACTION W/PHACO Left 10/01/2017   Procedure: CATARACT EXTRACTION PHACO AND INTRAOCULAR LENS PLACEMENT (IOC)-LEFT DIABETIC;  Surgeon: Galen Manila, MD;  Location: [facility] ORS;  Service: Ophthalmology;  Laterality: Left;  [place] 01:05.7 AP% 12.2 CDE 7.42 Fluid pack lot # [id number]   CTR Left    TONSILLECTOMY      Prior to Admission medications   Medication Sig Start Date End Date Taking? Authorizing Provider  allopurinol (ZYLOPRIM) 100 MG tablet Take 100 mg by mouth daily. 03/01/20   [provider]  aspirin EC 81 MG tablet Take 81 mg by mouth daily.    [provider]  carvedilol (COREG) 25 MG tablet Take 25 mg by mouth 2 (two) times daily.  02/14/15   [provider]  colchicine 0.6 MG tablet Take 0.6 mg by mouth daily.    [provider]  DULoxetine (CYMBALTA) 60 MG capsule Take 60 mg by mouth daily. 03/01/20   [provider]  losartan (COZAAR) 50 MG tablet Take 50 mg by mouth daily.  01/23/15   [provider]  metFORMIN (GLUCOPHAGE-XR) 500 MG 24 hr tablet Take 1,000 mg by mouth 2 (two) times daily.     [provider]  methocarbamol (ROBAXIN) 500 MG tablet Take 1 tablet (500 mg total) by mouth 4 (four) times daily. 06/18/20   Cuthriell, Delorise Royals, PA-C  ondansetron (ZOFRAN-ODT) 4 MG disintegrating tablet Take 1 tablet (4 mg total) by mouth every 8 (eight) hours  as needed for nausea or vomiting. 06/18/20   Cuthriell, Delorise Royals, PA-C  pantoprazole (PROTONIX) 40 MG tablet Take 40 mg by mouth daily. 03/07/17   [provider]  predniSONE (DELTASONE) 50 MG tablet Take 1 tablet (50 mg total) by mouth daily with breakfast. 06/18/20   Cuthriell, Delorise Royals, PA-C  torsemide (DEMADEX) 10 MG tablet Take 10 mg by mouth daily.  03/23/15   [provider]  traMADol (ULTRAM) 50 MG tablet Take 1 tablet (50 mg total) by mouth every 6 (six) hours as needed. 06/18/20   Cuthriell, Delorise Royals, PA-C  Vitamin D,  Ergocalciferol, (DRISDOL) 1.25 MG (50000 UNIT) CAPS capsule Take 50,000 Units by mouth once a week. 02/25/20   [provider]    Allergies Omeprazole, Tramadol, Atorvastatin, and Codeine  Family History  Problem Relation Age of Onset   Alcohol abuse Brother    Breast cancer Sister     Social History Social History   Tobacco Use   Smoking status: Former    Pack years: 0.00   Smokeless tobacco: Current    Types: Snuff  Vaping Use   Vaping Use: Never used  Substance Use Topics   Alcohol use: Yes    Comment: occasonally   Drug use: No    Review of Systems  Constitutional: Negative for fever. Eyes: Negative for visual changes. ENT: Negative for facial injury or neck injury Cardiovascular: Negative for chest injury. Respiratory: Negative for shortness of breath. Negative for chest wall injury. Gastrointestinal: Negative for abdominal pain or injury. Genitourinary: Negative for dysuria. Musculoskeletal: Negative for back injury, + L hip pain Skin: Negative for laceration/abrasions. Neurological: Negative for head injury.   ____________________________________________   PHYSICAL EXAM:  VITAL SIGNS: ED Triage Vitals  Enc Vitals Group     BP 04/03/21 0041 (!) 106/55     Pulse Rate 04/03/21 0041 83     Resp 04/03/21 0041 18     Temp 04/03/21 0041 97.7 F (36.5 C)     Temp Source 04/03/21 0041 Oral     SpO2 04/03/21 0200 94 %     Weight 04/03/21 0042 215 lb (97.5 kg)     Height 04/03/21 0042 5\' 2"  (1.575 m)     Head Circumference --      Peak Flow --      Pain Score 04/03/21 0042 9     Pain Loc --      Pain Edu? --      Excl. in GC? --     Full spinal precautions maintained throughout the trauma exam. Constitutional: Alert and oriented. No acute distress. Does not appear intoxicated. HEENT Head: Normocephalic and atraumatic. Face: No facial bony tenderness. Stable midface Ears: No hemotympanum bilaterally. No Battle sign Eyes: No eye injury. PERRL.  No raccoon eyes Nose: Nontender. No epistaxis. No rhinorrhea Mouth/Throat: Mucous membranes are moist. No oropharyngeal blood. No dental injury. Airway patent without stridor. Normal voice. Neck: no C-collar. No midline c-spine tenderness.  Cardiovascular: Normal rate, regular rhythm. Normal and symmetric distal pulses are present in all extremities. Pulmonary/Chest: Chest wall is stable and nontender to palpation/compression. Normal respiratory effort. Breath sounds are normal. No crepitus.  Abdominal: Soft, nontender, non distended. Musculoskeletal: Patient is tender to palpation over the posterior pelvis with no bruising or deformity. Nontender with normal full range of motion in all extremities. No deformities. No thoracic or lumbar midline spinal tenderness. Pelvis is stable. Skin: Skin is warm, dry and intact. No abrasions or contutions. Psychiatric: Speech  and behavior are appropriate. Neurological: Normal speech and language. Moves all extremities to command. No gross focal neurologic deficits are appreciated.  Glascow Coma Score: 4 - Opens eyes on own 6 - Follows simple motor commands 5 - Alert and oriented GCS: 15   ____________________________________________   LABS (all labs ordered are listed, but only abnormal results are displayed)  Labs Reviewed  RESP PANEL BY RT-PCR (FLU A&B, COVID) [id number]   ____________________________________________  EKG  ED ECG REPORT I, Nita Sickle, the attending physician, personally viewed and interpreted this ECG.  Sinus rhythm with an incomplete left bundle branch block, QTC of 504, no ST elevations or depressions.  Unchanged when compared to prior. ____________________________________________  RADIOLOGY  I have personally reviewed the images performed during this visit and I agree with the Radiologist's read.   Interpretation by Radiologist:  CT PELVIS WO CONTRAST  Result Date: 04/03/2021 CLINICAL DATA:  Fall, left hip  pain EXAM: CT PELVIS WITHOUT CONTRAST TECHNIQUE: Multidetector CT imaging of the pelvis was performed following the standard protocol without intravenous contrast. COMPARISON:  Radiograph 04/03/2021, CT 12/28/2017 FINDINGS: Urinary Tract: Circumferential bladder wall thickening may be related to underdistention though could correlate for urinary symptoms. Distal ureters are unremarkable. Bowel: No conspicuous large or small bowel thickening. No evidence of obstruction. Scattered colonic diverticula without focal inflammation to suggest diverticulitis. Normal appendix in the right lower quadrant. Vascular/Lymphatic: Atherosclerotic calcifications within the distal abdominal aorta and branch vessels. No aneurysm or ectasia. No enlarged pelvic lymph nodes. Reproductive: Uterus is surgically absent. No concerning adnexal lesions. Other: No abdominopelvic free fluid or free gas. No bowel containing hernias. Mild stranding and contusive changes along the posterolateral left hip. No large hematoma. Musculoskeletal: No visible vertebral body fracture or height loss within the margins of imaging. Bones of the pelvis and sacrum appear intact and congruent. Proximal femora are intact and normally located. Discogenic and facet degenerative changes present in lumbar levels as included. Additional degenerative features noted in the pubic symphysis and bilateral SI joints including partial bony fusion across the superior joint spaces. Proximal femora are intact and normally located within the hips. Mild-to-moderate bilateral hip arthrosis with periacetabular spurring. Enthesopathic changes are noted about the hips and pelvis including iliac crest, anterosuperior iliac spine and bilateral greater trochanters and along the ischial tuberosities. Musculature appears normal and symmetric without clear avulsive change or intramuscular hematoma. IMPRESSION: 1. No acute fracture or traumatic malalignment of the bony pelvis. 2. Mild  stranding likely contusive changes, of the posterolateral left hip. 3. Degenerative changes of the lower lumbar levels, SI joints and bilateral hips, as above. 4. Diverticulosis without evidence of acute diverticulitis. 5.  Aortic Atherosclerosis (ICD10-I70.0). Electronically Signed   By: Kreg Shropshire M.D.   On: 04/03/2021 04:08   DG Hip Unilat With Pelvis 2-3 Views Left  Result Date: 04/03/2021 CLINICAL DATA:  Fall, left hip pain EXAM: DG HIP (WITH OR WITHOUT PELVIS) 2-3V LEFT COMPARISON:  None. FINDINGS: Bones of the pelvis appear intact and congruent. Proximal femora intact and normally located. Degenerative changes noted in the lower lumbar spine and both SI joints. Mild-to-moderate degenerative changes in the bilateral hips. Enthesopathic changes about the pelvis and proximal femora as well. The osseous structures appear diffusely demineralized which may limit detection of small or nondisplaced fractures. No suspicious lytic or blastic lesions. Lateral left hip swelling, could reflect hematoma or contusive change. No soft tissue gas or foreign body. Vascular calcium in the soft tissues. Normal bowel gas pattern. Soft tissues  otherwise unremarkable. IMPRESSION: No acute fracture or traumatic osseous injury. Lateral left hip swelling, correlate for contusive change or hematoma. Degenerative changes in the spine, hips and pelvis. Electronically Signed   By: Kreg Shropshire M.D.   On: 04/03/2021 01:10     ____________________________________________   PROCEDURES  Procedure(s) performed: None Procedures Critical Care performed:  None ____________________________________________   INITIAL IMPRESSION / ASSESSMENT AND PLAN / ED COURSE  70 y.o. female with a history of anemia, CHF, diabetes, hypertension, gout who presents for evaluation of a mechanical fall after missing a porch step and falling on her buttock.  She is complaining of pain in the left posterior pelvis and is tender to palpation in that  area with no bruising or deformities.  X-ray negative for fracture or dislocation.  Patient given Tylenol, oxycodone and Lidoderm patch.  Patient unable to ambulate therefore CT was done which is also negative for fracture dislocation showing contusion of the posterior left hip.  Again patient attempted to be ambulated and is unable to bear any weight on that leg.  Patient given IV fentanyl.  Will discuss with the hospitalist for admission for pain control.       ____________________________________________  Please note:  Patient was evaluated in Emergency Department today for the symptoms described in the history of present illness. Patient was evaluated in the context of the global COVID-19 pandemic, which necessitated consideration that the patient might be at risk for infection with the SARS-CoV-2 virus that causes COVID-19. Institutional protocols and algorithms that pertain to the evaluation of patients at risk for COVID-19 are in a state of rapid change based on information released by regulatory bodies including the CDC and federal and state organizations. These policies and algorithms were followed during the patient's care in the ED.  Some ED evaluations and interventions may be delayed as a result of limited staffing during the pandemic.   ____________________________________________   FINAL CLINICAL IMPRESSION(S) / ED DIAGNOSES   Final diagnoses:  Fall, initial encounter  Contusion of lower back and pelvis, initial encounter      NEW MEDICATIONS STARTED DURING THIS VISIT:  ED Discharge Orders     None        Note:  This document was prepared using Dragon voice recognition software and may include unintentional dictation errors.    Don Perking, [place], MD 04/03/21 [phone]

## 2021-04-03 NOTE — Plan of Care (Signed)

## 2021-04-03 NOTE — Discharge Summary (Signed)
Physician Discharge Summary  Clyde Lundborgatricia Crull ZOX:[id number] DOB: Jan 07, 1951 DOA: 04/03/2021  PCP: Ethelda Chickoberts, Caroline, MD  Admit date: 04/03/2021 Discharge date: 04/03/2021  Admitted From: Home Disposition: Home  Recommendations for Outpatient Follow-up:  Follow ups as below. Please obtain CBC/BMP/Mag at follow up Please follow up on the following pending results: None  Home Health: PT/OT Equipment/Devices: Patient has rolling walker.  Bedside commode ordered.  Discharge Condition: Stable CODE STATUS: Full code   Follow-up Information     Ethelda Chickoberts, Caroline, MD Follow up in 1 week(s).   Specialty: Obstetrics and Gynecology Why: If symptoms worsen Contact information: [address] [place] [address] [phone]         Edwin CapMcDonald, Adam R, DPM. Schedule an appointment as soon as possible for a visit in 1 month(s).   Specialty: Podiatry Why: [address] [place] Contact information: [address] [place] [address] [phone]                 Hospital Course: 70 year old F with PMH of combined CHF, DM-2, morbid obesity, alcohol abuse and gout presenting with left hip pain and right foot pain following an accidental fall at home.  Reportedly, she had up to 9 cans of beer at a party.  She was stepping out onto the porch when she missed a step and fell.  She denies hitting her head or loss of consciousness.  She had no obvious injuries but progressive pain in her left hip and difficulty bearing weight that prompted her to come to the ED.  In ED, hemodynamically stable.  Labs and EKG without significant finding.  CT pelvis with no acute fracture or traumatic malalignment of the bony pelvis but mild stranding over posterolateral hip suggestive for contusion.  Patient was admitted for overnight observation for pain control.   Right foot x-ray showed transverse fracture of right fifth metatarsal base with some intermittent extension into the  articular surface.  Podiatry consulted and recommended CAM boot, WBAT and outpatient follow-up in 1 months.  Therapy recommended home health PT/OT.  Patient has rolling walker.  Bedside commode ordered.  Discharge Diagnoses:  Fall at home-likely due to alcohol Contusion of left hip from accidental fall Right fifth metatarsal bone fracture -Podiatry recommended CAM boot, WBAT and outpatient follow-up in 1 months -Pain control as above. -Scheduled Tylenol with as needed Aleve and oxycodone -PT/OT.  Has rolling walker.  Bedside commode ordered.  Chronic combined CHF: In 2020, TTE with LVEF of 50 to 55%, impaired relaxation.  Stable.  Appears euvolemic.  No cardiopulmonary symptoms. -Advised to avoid alcohol -Continue home medications  NIDDM-2: CBG within fair range. Recent Labs  Lab 04/03/21 0815  GLUCAP 129*  -Continue home medication  Essential hypertension: Normotensive. -Continue home cardiac meds   Alcohol abuse/weekend binge: No withdrawal symptoms. -Counseled on the importance of cessation/moderation  Hyponatremia: Likely beer potomania. -Recheck BMP in 1 week  Hypomagnesemia: Mg 1.5. -Replenished prior to discharge.  Normocytic anemia: H&H stable.  Body mass index is 39.32 kg/m.            Discharge Exam: Vitals:   04/03/21 0828 04/03/21 1212  BP: 138/66 (!) 164/70  Pulse: 84 84  Resp: 18 18  Temp: 97.8 F (36.6 C) 97.6 F (36.4 C)  SpO2: 96% 97%    GENERAL: No apparent distress.  Nontoxic. HEENT: MMM.  Vision and hearing grossly intact.  NECK: Supple.  No apparent JVD.  RESP: On room air.  No IWOB.  Fair  aeration bilaterally. CVS:  RRR. Heart sounds normal.  ABD/GI/GU: Bowel sounds present. Soft. Non tender.  MSK/EXT:  Moves extremities. No apparent deformity. No edema.  Tenderness over right fifth metatarsal base SKIN: no apparent skin lesion or wound NEURO: Awake, alert and oriented appropriately.  No apparent focal neuro deficit. PSYCH:  Calm. Normal affect.  Discharge Instructions  Discharge Instructions     Call MD for:  redness, tenderness, or signs of infection (pain, swelling, redness, odor or green/yellow discharge around incision site)   Complete by: As directed    Call MD for:  severe uncontrolled pain   Complete by: As directed    Diet - low sodium heart healthy   Complete by: As directed    Diet Carb Modified   Complete by: As directed    Discharge instructions   Complete by: As directed    It has been a pleasure taking care of you!  You were hospitalized due to left hip pain and left foot fracture after accidental fall at home.  Your surgeon recommended walking boot for the next 6 weeks. You can bear wait as tolerated with boot on.  We strongly recommend you quit drinking alcohol or not drinking more than one small can of beer in any given day.  You may talk to your primary care doctor if you need helping with alcohol use.  Please review your new medication list and the directions on your medications before you take them.  Please follow-up with your podiatrist (surgeon) as recommended to you.   Take care,   Increase activity slowly   Complete by: As directed       Allergies as of 04/03/2021       Reactions   Omeprazole Other (See Comments)   CHEST PAIN    Tramadol Nausea Only, Nausea And Vomiting   Atorvastatin Other (See Comments)   And another statin too   Codeine Other (See Comments), Anxiety   Reaction: jittery  Jittery        Medication List     TAKE these medications    acetaminophen 500 MG tablet Commonly known as: TYLENOL Take 2 tablets (1,000 mg total) by mouth every 8 (eight) hours.   amLODipine 10 MG tablet Commonly known as: NORVASC Take 10 mg by mouth daily.   aspirin EC 81 MG tablet Take 81 mg by mouth daily.   carvedilol 25 MG tablet Commonly known as: COREG Take 25 mg by mouth 2 (two) times daily.   colchicine 0.6 MG tablet Take 0.6 mg by mouth daily as  needed.   escitalopram 20 MG tablet Commonly known as: LEXAPRO Take 10 mg by mouth daily.   losartan 100 MG tablet Commonly known as: COZAAR Take 100 mg by mouth daily.   metFORMIN 500 MG tablet Commonly known as: GLUCOPHAGE Take 2 tablets by mouth 2 (two) times daily.   Naproxen Sodium 220 MG Caps Commonly known as: Aleve Take 2 capsules (440 mg total) by mouth 2 (two) times daily as needed for up to 4 days (moderate pain).   oxyCODONE 5 MG immediate release tablet Commonly known as: Roxicodone Take 1 tablet (5 mg total) by mouth every 6 (six) hours as needed for up to 5 days for severe pain or breakthrough pain.   pantoprazole 40 MG tablet Commonly known as: PROTONIX Take 40 mg by mouth daily.   torsemide 10 MG tablet Commonly known as: DEMADEX Take 10 mg by mouth daily.  Durable Medical Equipment  (From admission, onward)           Start     Ordered   04/03/21 1124  For home use only DME Bedside commode  Once       Question:  Patient needs a bedside commode to treat with the following condition  Answer:  Generalized weakness   04/03/21 1123            Consultations: Podiatric  Procedures/Studies:   CT PELVIS WO CONTRAST  Result Date: 04/03/2021 CLINICAL DATA:  Fall, left hip pain EXAM: CT PELVIS WITHOUT CONTRAST TECHNIQUE: Multidetector CT imaging of the pelvis was performed following the standard protocol without intravenous contrast. COMPARISON:  Radiograph 04/03/2021, CT 12/28/2017 FINDINGS: Urinary Tract: Circumferential bladder wall thickening may be related to underdistention though could correlate for urinary symptoms. Distal ureters are unremarkable. Bowel: No conspicuous large or small bowel thickening. No evidence of obstruction. Scattered colonic diverticula without focal inflammation to suggest diverticulitis. Normal appendix in the right lower quadrant. Vascular/Lymphatic: Atherosclerotic calcifications within the distal  abdominal aorta and branch vessels. No aneurysm or ectasia. No enlarged pelvic lymph nodes. Reproductive: Uterus is surgically absent. No concerning adnexal lesions. Other: No abdominopelvic free fluid or free gas. No bowel containing hernias. Mild stranding and contusive changes along the posterolateral left hip. No large hematoma. Musculoskeletal: No visible vertebral body fracture or height loss within the margins of imaging. Bones of the pelvis and sacrum appear intact and congruent. Proximal femora are intact and normally located. Discogenic and facet degenerative changes present in lumbar levels as included. Additional degenerative features noted in the pubic symphysis and bilateral SI joints including partial bony fusion across the superior joint spaces. Proximal femora are intact and normally located within the hips. Mild-to-moderate bilateral hip arthrosis with periacetabular spurring. Enthesopathic changes are noted about the hips and pelvis including iliac crest, anterosuperior iliac spine and bilateral greater trochanters and along the ischial tuberosities. Musculature appears normal and symmetric without clear avulsive change or intramuscular hematoma. IMPRESSION: 1. No acute fracture or traumatic malalignment of the bony pelvis. 2. Mild stranding likely contusive changes, of the posterolateral left hip. 3. Degenerative changes of the lower lumbar levels, SI joints and bilateral hips, as above. 4. Diverticulosis without evidence of acute diverticulitis. 5.  Aortic Atherosclerosis (ICD10-I70.0). Electronically Signed   By: Kreg Shropshire M.D.   On: 04/03/2021 04:08   DG Foot 2 Views Right  Result Date: 04/03/2021 CLINICAL DATA:  Right foot pain EXAM: RIGHT FOOT - 2 VIEW COMPARISON:  None. FINDINGS: Acute appearing transversely oriented fracture through the base of the fifth metatarsal with adjacent soft tissue swelling. Possible longitudinal fracture extension into the articular surface of the proximal  metatarsal as well. No other acute fracture or traumatic malalignment. Findings on a background of diffuse mild to moderate degenerative changes throughout the foot and imaged ankle. Bidirectional calcaneal spurs are noted as well. IMPRESSION: Fracture of the base of the fifth metatarsal with a predominantly transverse fracture line albeit with some longitudinal extension to the articular surface. Associated soft tissue swelling. Additional degenerative changes throughout the foot and ankle. Bidirectional calcaneal spurs. Electronically Signed   By: Kreg Shropshire M.D.   On: 04/03/2021 06:15   DG Hip Unilat With Pelvis 2-3 Views Left  Result Date: 04/03/2021 CLINICAL DATA:  Fall, left hip pain EXAM: DG HIP (WITH OR WITHOUT PELVIS) 2-3V LEFT COMPARISON:  None. FINDINGS: Bones of the pelvis appear intact and congruent. Proximal femora intact and normally located.  Degenerative changes noted in the lower lumbar spine and both SI joints. Mild-to-moderate degenerative changes in the bilateral hips. Enthesopathic changes about the pelvis and proximal femora as well. The osseous structures appear diffusely demineralized which may limit detection of small or nondisplaced fractures. No suspicious lytic or blastic lesions. Lateral left hip swelling, could reflect hematoma or contusive change. No soft tissue gas or foreign body. Vascular calcium in the soft tissues. Normal bowel gas pattern. Soft tissues otherwise unremarkable. IMPRESSION: No acute fracture or traumatic osseous injury. Lateral left hip swelling, correlate for contusive change or hematoma. Degenerative changes in the spine, hips and pelvis. Electronically Signed   By: Kreg Shropshire M.D.   On: 04/03/2021 01:10       The results of significant diagnostics from this hospitalization (including imaging, microbiology, ancillary and laboratory) are listed below for reference.     Microbiology: Recent Results (from the past 240 hour(s))  Resp Panel by RT-PCR  (Flu A&B, Covid) Nasopharyngeal Swab     Status: None   Collection Time: 04/03/21  4:43 AM   Specimen: Nasopharyngeal Swab; Nasopharyngeal(NP) swabs in vial transport medium  Result Value Ref Range Status   SARS Coronavirus 2 by RT PCR NEGATIVE NEGATIVE Final    Comment: (NOTE) SARS-CoV-2 target nucleic acids are NOT DETECTED.  The SARS-CoV-2 RNA is generally detectable in upper respiratory specimens during the acute phase of infection. The lowest concentration of SARS-CoV-2 viral copies this assay can detect is 138 copies/mL. A negative result does not preclude SARS-Cov-2 infection and should not be used as the sole basis for treatment or other patient management decisions. A negative result may occur with  improper specimen collection/handling, submission of specimen other than nasopharyngeal swab, presence of viral mutation(s) within the areas targeted by this assay, and inadequate number of viral copies(<138 copies/mL). A negative result must be combined with clinical observations, patient history, and epidemiological information. The expected result is Negative.  Fact Sheet for Patients:  BloggerCourse.com  Fact Sheet for Healthcare Providers:  SeriousBroker.it  This test is no t yet approved or cleared by the [place] FDA and  has been authorized for detection and/or diagnosis of SARS-CoV-2 by FDA under an Emergency Use Authorization (EUA). This EUA will remain  in effect (meaning this test can be used) for the duration of the COVID-19 declaration under Section 564(b)(1) of the Act, 21 U.S.C.section 360bbb-3(b)(1), unless the authorization is terminated  or revoked sooner.       Influenza A by PCR NEGATIVE NEGATIVE Final   Influenza B by PCR NEGATIVE NEGATIVE Final    Comment: (NOTE) The Xpert Xpress SARS-CoV-2/FLU/RSV plus assay is intended as an aid in the diagnosis of influenza from Nasopharyngeal swab specimens  and should not be used as a sole basis for treatment. Nasal washings and aspirates are unacceptable for Xpert Xpress SARS-CoV-2/FLU/RSV testing.  Fact Sheet for Patients: BloggerCourse.com  Fact Sheet for Healthcare Providers: SeriousBroker.it  This test is not yet approved or cleared by the [place] FDA and has been authorized for detection and/or diagnosis of SARS-CoV-2 by FDA under an Emergency Use Authorization (EUA). This EUA will remain in effect (meaning this test can be used) for the duration of the COVID-19 declaration under Section 564(b)(1) of the Act, 21 U.S.C. section 360bbb-3(b)(1), unless the authorization is terminated or revoked.  Performed at [facility], [address] [place], [place] [address]      Labs:  CBC: Recent Labs  Lab 04/03/21 0601  WBC 6.8  NEUTROABS 3.5  HGB 11.9*  HCT 35.9*  MCV 74.8*  PLT 232   BMP &GFR Recent Labs  Lab 04/03/21 0601  NA 127*  K 4.2  CL 93*  CO2 24  GLUCOSE 138*  BUN 18  CREATININE 0.73  CALCIUM 9.1  MG 1.5*   Estimated Creatinine Clearance: 72.4 mL/min (by C-G formula based on SCr of 0.73 mg/dL). Liver & Pancreas: Recent Labs  Lab 04/03/21 0601  AST 40  ALT 43  ALKPHOS 76  BILITOT 0.7  PROT 7.2  ALBUMIN 4.0   No results for input(s): LIPASE, AMYLASE in the last 168 hours. No results for input(s): AMMONIA in the last 168 hours. Diabetic: No results for input(s): HGBA1C in the last 72 hours. Recent Labs  Lab 04/03/21 0815  GLUCAP 129*   Cardiac Enzymes: No results for input(s): CKTOTAL, CKMB, CKMBINDEX, TROPONINI in the last 168 hours. No results for input(s): PROBNP in the last 8760 hours. Coagulation Profile: No results for input(s): INR, PROTIME in the last 168 hours. Thyroid Function Tests: No results for input(s): TSH, T4TOTAL, FREET4, T3FREE, THYROIDAB in the last 72 hours. Lipid Profile: No results for input(s):  CHOL, HDL, LDLCALC, TRIG, CHOLHDL, LDLDIRECT in the last 72 hours. Anemia Panel: No results for input(s): VITAMINB12, FOLATE, FERRITIN, TIBC, IRON, RETICCTPCT in the last 72 hours. Urine analysis:    Component Value Date/Time   COLORURINE COLORLESS (A) 12/29/2020 1420   APPEARANCEUR CLEAR (A) 12/29/2020 1420   APPEARANCEUR Hazy 09/18/2013 0828   LABSPEC 1.005 12/29/2020 1420   LABSPEC 1.013 09/18/2013 0828   PHURINE 5.0 12/29/2020 1420   GLUCOSEU NEGATIVE 12/29/2020 1420   GLUCOSEU Negative 09/18/2013 0828   HGBUR NEGATIVE 12/29/2020 1420   BILIRUBINUR NEGATIVE 12/29/2020 1420   BILIRUBINUR Negative 09/18/2013 0828   KETONESUR NEGATIVE 12/29/2020 1420   PROTEINUR NEGATIVE 12/29/2020 1420   NITRITE NEGATIVE 12/29/2020 1420   LEUKOCYTESUR NEGATIVE 12/29/2020 1420   LEUKOCYTESUR 1+ 09/18/2013 0828   Sepsis Labs: Invalid input(s): PROCALCITONIN, LACTICIDVEN   Time coordinating discharge: 35 minutes  SIGNED:  Almon Hercules, MD  [facility] 04/03/2021, 2:17 PM  If 7PM-7AM, please contact night-coverage [email or web address]

## 2021-04-03 NOTE — Progress Notes (Signed)
Anticoagulation monitoring(Lovenox):  70 yo female ordered Lovenox 40 mg Q24h    Filed Weights   04/03/21 0042  Weight: 97.5 kg (215 lb)   BMI 39   Lab Results  Component Value Date   CREATININE 0.73 04/03/2021   CREATININE 0.75 12/29/2020   CREATININE 0.92 11/21/2020   Estimated Creatinine Clearance: 72.4 mL/min (by C-G formula based on SCr of 0.73 mg/dL). Hemoglobin & Hematocrit     Component Value Date/Time   HGB 11.9 (L) 04/03/2021 0601   HGB 11.8 (L) 09/02/2014 0829   HCT 35.9 (L) 04/03/2021 0601   HCT 37.6 09/02/2014 0829     Per Protocol for Patient with estCrcl > 30 ml/min and BMI > 30, will transition to Lovenox 50 mg Q24h.

## 2021-04-03 NOTE — Evaluation (Signed)
Physical Therapy Evaluation Patient Details Name: Betty Luna MRN: [id number] DOB: 05/29/1951 Today's Date: 04/03/2021   History of Present Illness  presented to ER and admitted under observation after mechanical fall with acute onset of L hip, R foot pain; noted with R 5th metatarsal fracture (WBAT with CAM boot)  Clinical Impression  Upon evaluation, patient alert and oriented; follows commands and agreeable to participation with session.  Rates R foot pain 8/10; meds requested/received per RN during session.  Currently requiring sup for bed mobility; cga/close sup for sit/stand, basic transfers and gait (25' x3) with RW; cga for stairs (up/down 4 with bilat rails).  Gait pattern significant for 3-point, step to gait pattern; decreased stance time/loading to R LE, heavy use of RW to offset R LE WBing as needed.  Very slow and deliberate, limited by pain.  However, no buckling or LOB noted. Educated in role of PT and progressive mobility; donning/doffing and wearing schedule of R CAM boot; safety education for transfers and gait; technique for stair negotiation and car transfers. Patient voiced understanding of all information. Would benefit from skilled PT to address above deficits and promote optimal return to PLOF.; recommend transition to STR upon discharge from acute hospitalization.     Follow Up Recommendations Home health PT    Equipment Recommendations   (has RW)    Recommendations for Other Services       Precautions / Restrictions Precautions Precautions: Fall Required Braces or Orthoses:  (CAM boot R LE With all WBing) Restrictions Weight Bearing Restrictions: Yes RLE Weight Bearing: Weight bearing as tolerated Other Position/Activity Restrictions: with CAM boot      Mobility  Bed Mobility Overal bed mobility: Needs Assistance Bed Mobility: Supine to Sit;Sit to Supine     Supine to sit: Modified independent (Device/Increase time) Sit to supine: Supervision         Transfers Overall transfer level: Needs assistance Equipment used: Rolling walker (2 wheeled) Transfers: Sit to/from Stand Sit to Stand: Min guard;Supervision         General transfer comment: cuing for hand/foot placement, overall safety with mechanics  Ambulation/Gait Ambulation/Gait assistance: Min guard;Supervision Gait Distance (Feet):  (20-25' x3) Assistive device: Rolling walker (2 wheeled)       General Gait Details: 3-point, step to gait pattern; decreased stance time/loading to R LE, heavy use of RW to offset R LE WBing as needed.  Very slow and deliberate, limited by pain.  However, no buckling or LOB noted.  Stairs Stairs: Yes Stairs assistance: Min guard Stair Management: Two rails Number of Stairs: 4 General stair comments: step to gait pattern; min cuing for technique  Wheelchair Mobility    Modified Rankin (Stroke Patients Only)       Balance Overall balance assessment: Needs assistance Sitting-balance support: No upper extremity supported;Feet supported Sitting balance-Leahy Scale: Good     Standing balance support: Bilateral upper extremity supported Standing balance-Leahy Scale: Fair                               Pertinent Vitals/Pain Pain Assessment: 0-10 Pain Score: 8  Pain Location: R foot Pain Descriptors / Indicators: Aching;Guarding;Grimacing Pain Intervention(s): Limited activity within patient's tolerance;Monitored during session;Repositioned;Patient requesting pain meds-RN notified;RN gave pain meds during session    Home Living Family/patient expects to be discharged to:: Private residence Living Arrangements: Spouse/significant other Available Help at Discharge: Family Type of Home: House Home Access: Stairs to enter Entrance Stairs-Rails:  Right Entrance Stairs-Number of Steps: 2 Home Layout: One level Home Equipment: Walker - 2 wheels      Prior Function Level of Independence: Independent          Comments: Indep with ADLs, household and community mobilization without assist device.  Endorses 1-2 falls within previous six months.     Hand Dominance        Extremity/Trunk Assessment   Upper Extremity Assessment Upper Extremity Assessment: Overall WFL for tasks assessed    Lower Extremity Assessment Lower Extremity Assessment: Overall WFL for tasks assessed (R ankle grossly 3-/5, limited by pain with movement; wiggles R toes, but guarded/limited range)       Communication   Communication: No difficulties  Cognition Arousal/Alertness: Awake/alert Behavior During Therapy: WFL for tasks assessed/performed Overall Cognitive Status: Within Functional Limits for tasks assessed                                        General Comments      Exercises Other Exercises Other Exercises: Educated in role of PT and progressive mobility; donning/doffing and wearing schedule of R CAM boot; safety education for transfers and gait; technique for stair negotiation and car transfers. Patient voiced understanding of all information. Other Exercises: Toilet transfer, ambulatory with RW, cga/close sup; good walker management over surfaces changes, around obstacles   Assessment/Plan    PT Assessment Patient needs continued PT services  PT Problem List Decreased strength;Decreased range of motion;Decreased activity tolerance;Decreased balance;Decreased mobility;Decreased knowledge of use of DME;Decreased safety awareness;Decreased knowledge of precautions;Obesity;Pain       PT Treatment Interventions DME instruction;Gait training;Stair training;Functional mobility training;Therapeutic activities;Therapeutic exercise;Balance training;Patient/family education    PT Goals (Current goals can be found in the Care Plan section)  Acute Rehab PT Goals Patient Stated Goal: to make this pain better PT Goal Formulation: With patient Time For Goal Achievement: 04/17/21 Potential to  Achieve Goals: Good    Frequency Min 2X/week   Barriers to discharge        Co-evaluation               AM-PAC PT "6 Clicks" Mobility  Outcome Measure Help needed turning from your back to your side while in a flat bed without using bedrails?: None Help needed moving from lying on your back to sitting on the side of a flat bed without using bedrails?: None Help needed moving to and from a bed to a chair (including a wheelchair)?: A Little Help needed standing up from a chair using your arms (e.g., wheelchair or bedside chair)?: A Little Help needed to walk in hospital room?: A Little Help needed climbing 3-5 steps with a railing? : A Little 6 Click Score: 20    End of Session Equipment Utilized During Treatment: Gait belt Activity Tolerance: Patient tolerated treatment well Patient left: in bed;with chair alarm set (OT at bedside for evaluation) Nurse Communication: Mobility status PT Visit Diagnosis: Muscle weakness (generalized) (M62.81);Difficulty in walking, not elsewhere classified (R26.2)    Time: 0630-1601 PT Time Calculation (min) (ACUTE ONLY): 38 min   Charges:   PT Evaluation $PT Eval Moderate Complexity: 1 Mod PT Treatments $Gait Training: 8-22 mins $Therapeutic Activity: 8-22 mins        Dell Hurtubise H. Manson Passey, PT, DPT, NCS 04/03/21, 11:19 AM [phone]

## 2021-04-03 NOTE — Evaluation (Signed)
Occupational Therapy Evaluation Patient Details Name: Betty Luna MRN: [id number] DOB: January 25, 1951 Today's Date: 04/03/2021    History of Present Illness presented to ER and admitted under observation after mechanical fall with acute onset of L hip, R foot pain; noted with R 5th metatarsal fracture (WBAT with CAM boot)   Clinical Impression   Patient presenting with decreased I in self care, balance, functional mobility/transfers, endurance, and safety awareness. Patient reports living at home with husband independently PTA. She does endorse 2 falls recently.OT reviewed donning/doffing of CAM boot with pt returning demonstration. OT discussed self care tasks and pt reports she plans to sink bath while having CAM boot. She endorses some urgency at night for urination and reports bathroom as being far away. OT recommends 3 in 1 for safety with toileting needs. Pt reports she has been having "more bad days than good days" at home secondary to pain and low endurance. OT began energy conservation education as well. Patient will benefit from acute OT to increase overall independence in the areas of ADLs, functional mobility, and safety awareness in order to safely discharge home with family.    Follow Up Recommendations  Home health OT;Supervision - Intermittent    Equipment Recommendations  3 in 1 bedside commode       Precautions / Restrictions Precautions Precautions: Fall Required Braces or Orthoses:  (CAM boot R LE With all WBing) Restrictions Weight Bearing Restrictions: Yes RLE Weight Bearing: Weight bearing as tolerated Other Position/Activity Restrictions: with CAM boot      Mobility Bed Mobility Overal bed mobility: Needs Assistance Bed Mobility: Supine to Sit;Sit to Supine     Supine to sit: Modified independent (Device/Increase time) Sit to supine: Supervision        Transfers Overall transfer level: Needs assistance Equipment used: Rolling walker (2  wheeled) Transfers: Sit to/from Stand Sit to Stand: Min guard;Supervision         General transfer comment: cuing for hand/foot placement, overall safety with mechanics    Balance Overall balance assessment: Needs assistance Sitting-balance support: No upper extremity supported;Feet supported Sitting balance-Leahy Scale: Good     Standing balance support: Bilateral upper extremity supported Standing balance-Leahy Scale: Fair                             ADL either performed or assessed with clinical judgement   ADL Overall ADL's : Needs assistance/impaired                                     Functional mobility during ADLs: Min guard;Rolling walker General ADL Comments: Pt demonstrates ability to don/doff CAM boot with min cuing for technique. Toileting needs with min  guard - min A with use of RW.     Vision Patient Visual Report: No change from baseline              Pertinent Vitals/Pain Pain Assessment: 0-10 Pain Score: 6  Pain Location: R foot Pain Descriptors / Indicators: Aching;Guarding;Grimacing Pain Intervention(s): Limited activity within patient's tolerance;Monitored during session;Repositioned     Hand Dominance Right   Extremity/Trunk Assessment Upper Extremity Assessment Upper Extremity Assessment: Overall WFL for tasks assessed   Lower Extremity Assessment Lower Extremity Assessment: Overall WFL for tasks assessed       Communication Communication Communication: No difficulties   Cognition Arousal/Alertness: Awake/alert Behavior During Therapy: WFL for tasks  assessed/performed Overall Cognitive Status: Within Functional Limits for tasks assessed                                        Exercises Other Exercises Other Exercises: Educated in role of PT and progressive mobility; donning/doffing and wearing schedule of R CAM boot; safety education for transfers and gait; technique for stair negotiation  and car transfers. Patient voiced understanding of all information. Other Exercises: Toilet transfer, ambulatory with RW, cga/close sup; good walker management over surfaces changes, around obstacles        Home Living Family/patient expects to be discharged to:: Private residence Living Arrangements: Spouse/significant other Available Help at Discharge: Family Type of Home: House Home Access: Stairs to enter Secretary/administrator of Steps: 2 Entrance Stairs-Rails: Right Home Layout: One level     Bathroom Shower/Tub: Tub/shower unit         Home Equipment: Environmental consultant - 2 wheels          Prior Functioning/Environment Level of Independence: Independent        Comments: Indep with ADLs, household and community mobilization without assist device.  Endorses 1-2 falls within previous six months.        OT Problem List: Decreased strength;Decreased knowledge of use of DME or AE;Decreased activity tolerance;Decreased knowledge of precautions;Pain;Decreased safety awareness;Impaired balance (sitting and/or standing)      OT Treatment/Interventions: Self-care/ADL training;Manual therapy;Therapeutic exercise;Patient/family education;Modalities;Balance training;Energy conservation;Therapeutic activities;DME and/or AE instruction    OT Goals(Current goals can be found in the care plan section) Acute Rehab OT Goals Patient Stated Goal: to go home OT Goal Formulation: With patient Time For Goal Achievement: 04/17/21 Potential to Achieve Goals: Good  OT Frequency: Min 2X/week   Barriers to D/C:    none known at this time          AM-PAC OT "6 Clicks" Daily Activity     Outcome Measure Help from another person eating meals?: None Help from another person taking care of personal grooming?: None Help from another person toileting, which includes using toliet, bedpan, or urinal?: A Little Help from another person bathing (including washing, rinsing, drying)?: A Little Help from  another person to put on and taking off regular upper body clothing?: None Help from another person to put on and taking off regular lower body clothing?: A Little 6 Click Score: 21   End of Session Equipment Utilized During Treatment: Rolling walker Nurse Communication: Mobility status  Activity Tolerance: Patient tolerated treatment well Patient left: in bed;with call bell/phone within reach;with bed alarm set  OT Visit Diagnosis: Unsteadiness on feet (R26.81);Muscle weakness (generalized) (M62.81);Repeated falls (R29.6)                Time: 7564-3329 OT Time Calculation (min): 19 min Charges:  OT General Charges $OT Visit: 1 Visit OT Evaluation $OT Eval Low Complexity: 1 Low OT Treatments $Self Care/Home Management : 8-22 mins  Jackquline Denmark, MS, OTR/L , CBIS ascom [phone]  04/03/21, 12:07 PM

## 2021-04-03 NOTE — Plan of Care (Signed)
  Problem: Education: Goal: Knowledge of General Education information will improve Description: Including pain rating scale, medication(s)/side effects and non-pharmacologic comfort measures 04/03/2021 1221 by Jules Schick, RN Outcome: Completed/Met 04/03/2021 0911 by Jules Schick, RN Outcome: Progressing   Problem: Health Behavior/Discharge Planning: Goal: Ability to manage health-related needs will improve 04/03/2021 1221 by Jules Schick, RN Outcome: Completed/Met 04/03/2021 0911 by Jules Schick, RN Outcome: Progressing   Problem: Clinical Measurements: Goal: Ability to maintain clinical measurements within normal limits will improve 04/03/2021 1221 by Jules Schick, RN Outcome: Completed/Met 04/03/2021 0911 by Jules Schick, RN Outcome: Progressing Goal: Will remain free from infection 04/03/2021 1221 by Jules Schick, RN Outcome: Completed/Met 04/03/2021 0911 by Jules Schick, RN Outcome: Progressing Goal: Diagnostic test results will improve 04/03/2021 1221 by Jules Schick, RN Outcome: Completed/Met 04/03/2021 0911 by Jules Schick, RN Outcome: Progressing Goal: Respiratory complications will improve 04/03/2021 1221 by Jules Schick, RN Outcome: Completed/Met 04/03/2021 0911 by Jules Schick, RN Outcome: Progressing Goal: Cardiovascular complication will be avoided 04/03/2021 1221 by Jules Schick, RN Outcome: Completed/Met 04/03/2021 0911 by Jules Schick, RN Outcome: Progressing   Problem: Activity: Goal: Risk for activity intolerance will decrease 04/03/2021 1221 by Jules Schick, RN Outcome: Completed/Met 04/03/2021 0911 by Jules Schick, RN Outcome: Progressing   Problem: Nutrition: Goal: Adequate nutrition will be maintained 04/03/2021 1221 by Jules Schick, RN Outcome: Completed/Met 04/03/2021 0911 by Jules Schick, RN Outcome: Progressing   Problem: Coping: Goal: Level of anxiety will  decrease 04/03/2021 1221 by Jules Schick, RN Outcome: Completed/Met 04/03/2021 0911 by Jules Schick, RN Outcome: Progressing   Problem: Elimination: Goal: Will not experience complications related to bowel motility 04/03/2021 1221 by Jules Schick, RN Outcome: Completed/Met 04/03/2021 0911 by Jules Schick, RN Outcome: Progressing Goal: Will not experience complications related to urinary retention 04/03/2021 1221 by Jules Schick, RN Outcome: Completed/Met 04/03/2021 0911 by Jules Schick, RN Outcome: Progressing   Problem: Pain Managment: Goal: General experience of comfort will improve 04/03/2021 1221 by Jules Schick, RN Outcome: Completed/Met 04/03/2021 0911 by Jules Schick, RN Outcome: Progressing   Problem: Safety: Goal: Ability to remain free from injury will improve 04/03/2021 1221 by Jules Schick, RN Outcome: Completed/Met 04/03/2021 0911 by Jules Schick, RN Outcome: Progressing   Problem: Skin Integrity: Goal: Risk for impaired skin integrity will decrease 04/03/2021 1221 by Jules Schick, RN Outcome: Completed/Met 04/03/2021 0911 by Jules Schick, RN Outcome: Progressing

## 2021-04-27 ENCOUNTER — Emergency Department: Payer: Medicare HMO

## 2021-04-27 ENCOUNTER — Other Ambulatory Visit: Payer: Self-pay

## 2021-04-27 ENCOUNTER — Emergency Department
Admission: EM | Admit: 2021-04-27 | Discharge: 2021-04-27 | Disposition: A | Discharge status: Home / Self Care | Payer: Medicare HMO | Attending: Emergency Medicine | Admitting: Emergency Medicine

## 2021-04-27 ENCOUNTER — Encounter: Payer: Self-pay | Admitting: Emergency Medicine

## 2021-04-27 DIAGNOSIS — Z87891 Personal history of nicotine dependence: Secondary | ICD-10-CM | POA: Diagnosis not present

## 2021-04-27 DIAGNOSIS — S161XXA Strain of muscle, fascia and tendon at neck level, initial encounter: Secondary | ICD-10-CM

## 2021-04-27 DIAGNOSIS — R519 Headache, unspecified: Secondary | ICD-10-CM | POA: Insufficient documentation

## 2021-04-27 DIAGNOSIS — E1122 Type 2 diabetes mellitus with diabetic chronic kidney disease: Secondary | ICD-10-CM | POA: Insufficient documentation

## 2021-04-27 DIAGNOSIS — Z7982 Long term (current) use of aspirin: Secondary | ICD-10-CM | POA: Insufficient documentation

## 2021-04-27 DIAGNOSIS — Z79899 Other long term (current) drug therapy: Secondary | ICD-10-CM | POA: Insufficient documentation

## 2021-04-27 DIAGNOSIS — I130 Hypertensive heart and chronic kidney disease with heart failure and stage 1 through stage 4 chronic kidney disease, or unspecified chronic kidney disease: Secondary | ICD-10-CM | POA: Insufficient documentation

## 2021-04-27 DIAGNOSIS — S199XXA Unspecified injury of neck, initial encounter: Secondary | ICD-10-CM | POA: Diagnosis present

## 2021-04-27 DIAGNOSIS — Y92410 Unspecified street and highway as the place of occurrence of the external cause: Secondary | ICD-10-CM | POA: Diagnosis not present

## 2021-04-27 DIAGNOSIS — N182 Chronic kidney disease, stage 2 (mild): Secondary | ICD-10-CM | POA: Insufficient documentation

## 2021-04-27 DIAGNOSIS — Z7984 Long term (current) use of oral hypoglycemic drugs: Secondary | ICD-10-CM | POA: Diagnosis not present

## 2021-04-27 DIAGNOSIS — I5022 Chronic systolic (congestive) heart failure: Secondary | ICD-10-CM | POA: Diagnosis not present

## 2021-04-27 MED ORDER — ACETAMINOPHEN 325 MG PO TABS
650.0000 mg | ORAL_TABLET | Freq: Once | ORAL | Status: AC
  Administered 2021-04-27: 650 mg via ORAL
  Filled 2021-04-27: qty 2

## 2021-04-27 MED ORDER — OXYCODONE-ACETAMINOPHEN 5-325 MG PO TABS: 1.0000 | ORAL_TABLET | ORAL | 0 refills | Status: DC | PRN
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
## 2021-04-27 NOTE — Discharge Instructions (Addendum)
Follow-up with your regular doctor if not improving in 5 to 7 days.  Or you may follow-up with orthopedics.  Please call for appointment.  Take the medication as prescribed.  Apply ice to anything that hurts for the next 2 days.  Then you can use wet heat followed by ice.  Return emergency department worsening

## 2021-05-17 ENCOUNTER — Other Ambulatory Visit: Payer: Self-pay | Admitting: Obstetrics

## 2021-05-17 DIAGNOSIS — Z13820 Encounter for screening for osteoporosis: Secondary | ICD-10-CM

## 2021-07-11 ENCOUNTER — Emergency Department: Payer: Medicare HMO

## 2021-07-11 ENCOUNTER — Other Ambulatory Visit: Payer: Self-pay

## 2021-07-11 DIAGNOSIS — Z79899 Other long term (current) drug therapy: Secondary | ICD-10-CM | POA: Diagnosis not present

## 2021-07-11 DIAGNOSIS — Z7984 Long term (current) use of oral hypoglycemic drugs: Secondary | ICD-10-CM | POA: Insufficient documentation

## 2021-07-11 DIAGNOSIS — I5022 Chronic systolic (congestive) heart failure: Secondary | ICD-10-CM | POA: Insufficient documentation

## 2021-07-11 DIAGNOSIS — Z87891 Personal history of nicotine dependence: Secondary | ICD-10-CM | POA: Insufficient documentation

## 2021-07-11 DIAGNOSIS — N182 Chronic kidney disease, stage 2 (mild): Secondary | ICD-10-CM | POA: Diagnosis not present

## 2021-07-11 DIAGNOSIS — R42 Dizziness and giddiness: Secondary | ICD-10-CM | POA: Diagnosis not present

## 2021-07-11 DIAGNOSIS — E1122 Type 2 diabetes mellitus with diabetic chronic kidney disease: Secondary | ICD-10-CM | POA: Diagnosis not present

## 2021-07-11 DIAGNOSIS — Z7982 Long term (current) use of aspirin: Secondary | ICD-10-CM | POA: Diagnosis not present

## 2021-07-11 DIAGNOSIS — U071 COVID-19: Secondary | ICD-10-CM | POA: Insufficient documentation

## 2021-07-11 DIAGNOSIS — I130 Hypertensive heart and chronic kidney disease with heart failure and stage 1 through stage 4 chronic kidney disease, or unspecified chronic kidney disease: Secondary | ICD-10-CM | POA: Insufficient documentation

## 2021-07-11 DIAGNOSIS — R059 Cough, unspecified: Secondary | ICD-10-CM | POA: Diagnosis present

## 2021-07-11 DIAGNOSIS — H9203 Otalgia, bilateral: Secondary | ICD-10-CM | POA: Diagnosis not present

## 2021-07-11 LAB — BASIC METABOLIC PANEL
Anion gap: 11 (ref 5–15)
BUN: 12 mg/dL (ref 8–23)
CO2: 29 mmol/L (ref 22–32)
Calcium: 9.1 mg/dL (ref 8.9–10.3)
Chloride: 100 mmol/L (ref 98–111)
Creatinine, Ser: 0.73 mg/dL (ref 0.44–1.00)
GFR, Estimated: >60 mL/min (ref >60)
Glucose, Bld: 140 mg/dL — ABNORMAL HIGH (ref 70–99)
Potassium: 3.3 mmol/L — ABNORMAL LOW (ref 3.5–5.1)
Sodium: 140 mmol/L (ref 135–145)

## 2021-07-11 LAB — CBC WITH DIFFERENTIAL/PLATELET
Abs Immature Granulocytes: 0.03 10*3/uL (ref 0.00–0.07)
Basophils Absolute: 0 10*3/uL (ref 0.0–0.1)
Basophils Relative: 0 %
Eosinophils Absolute: 0.1 10*3/uL (ref 0.0–0.5)
Eosinophils Relative: 2 %
HCT: 36.9 % (ref 36.0–46.0)
Hemoglobin: 12.1 g/dL (ref 12.0–15.0)
Immature Granulocytes: 0 %
Lymphocytes Relative: 17 %
Lymphs Abs: 1.2 10*3/uL (ref 0.7–4.0)
MCH: 25.2 pg — ABNORMAL LOW (ref 26.0–34.0)
MCHC: 32.8 g/dL (ref 30.0–36.0)
MCV: 76.7 fL — ABNORMAL LOW (ref 80.0–100.0)
Monocytes Absolute: 1 10*3/uL (ref 0.1–1.0)
Monocytes Relative: 14 %
Neutro Abs: 4.7 10*3/uL (ref 1.7–7.7)
Neutrophils Relative %: 67 %
Platelets: 212 10*3/uL (ref 150–400)
RBC: 4.81 MIL/uL (ref 3.87–5.11)
RDW: 16.4 % — ABNORMAL HIGH (ref 11.5–15.5)
WBC: 7 10*3/uL (ref 4.0–10.5)
nRBC: 0 % (ref 0.0–0.2)

## 2021-07-11 LAB — URINALYSIS, ROUTINE W REFLEX MICROSCOPIC
Bilirubin Urine: NEGATIVE
Glucose, UA: NEGATIVE mg/dL
Hgb urine dipstick: NEGATIVE
Leukocytes,Ua: NEGATIVE
Nitrite: NEGATIVE
Protein, ur: NEGATIVE mg/dL
Specific Gravity, Urine: 1.015 (ref 1.005–1.030)
pH: 5.5 (ref 5.0–8.0)

## 2021-07-11 LAB — TROPONIN I (HIGH SENSITIVITY): Troponin I (High Sensitivity): 14 ng/L (ref <18)

## 2021-07-11 LAB — RESP PANEL BY RT-PCR (FLU A&B, COVID) ARPGX2
Influenza A by PCR: NEGATIVE
Influenza B by PCR: NEGATIVE
SARS Coronavirus 2 by RT PCR: POSITIVE — AB

## 2021-07-11 NOTE — ED Triage Notes (Addendum)
Pt arrived via POV with husband reports cough that is productive at times, dizziness, back pain and ear pain.  Pt denies any chest pain.   Pt also c/o dysuria and has been taking old antibiotics, pt states sxs began 1 week ago.

## 2021-07-11 NOTE — ED Provider Notes (Signed)
Emergency Medicine Provider Triage Evaluation Note  Betty Luna , a 70 y.o. female  was evaluated in triage.  Pt complains of intermittently productive cough, back pain, ear pain, and some mild dizziness patient also notes some dysuria, for which she started taking an old, leftover antibiotic.Marland Kitchen  Review of Systems  Positive: Cough, dysuria, otalgia Negative: FCS  Physical Exam  BP (!) 148/77 (BP Location: Right Arm)   Pulse (!) 101   Temp 100.3 F (37.9 C) (Oral)   Resp 18   Ht 5\' 2"  (1.575 m)   Wt 96.6 kg   SpO2 95%   BMI 38.96 kg/m  Gen:   Awake, no distress  NAD Resp:  Normal effort CTA MSK:   Moves extremities without difficulty  Other:  CVS: RRR  Medical Decision Making  Medically screening exam initiated at 7:12 PM.  Appropriate orders placed.  Betty Luna was informed that the remainder of the evaluation will be completed by another provider, this initial triage assessment does not replace that evaluation, and the importance of remaining in the ED until their evaluation is complete.  Patient ED evaluation of multiple complaints including cough, otalgia, and mild dizziness.   Betty Lundborg, PA-C 07/11/21 1916    07/13/21, MD 07/11/21 2225

## 2021-07-11 NOTE — ED Notes (Signed)
MSE completed by Mauro Kaufmann

## 2021-07-12 ENCOUNTER — Emergency Department
Admission: EM | Admit: 2021-07-12 | Discharge: 2021-07-12 | Disposition: A | Discharge status: Home / Self Care | Payer: Medicare HMO | Attending: Emergency Medicine | Admitting: Emergency Medicine

## 2021-07-12 DIAGNOSIS — U071 COVID-19: Secondary | ICD-10-CM

## 2021-07-12 MED ORDER — NIRMATRELVIR/RITONAVIR (PAXLOVID)TABLET: 3.0000 | ORAL_TABLET | Freq: Two times a day (BID) | ORAL | 0 refills | Status: AC

## 2021-07-12 MED ORDER — ACETAMINOPHEN 500 MG PO TABS
1000.0000 mg | ORAL_TABLET | Freq: Once | ORAL | Status: AC
  Administered 2021-07-12: 1000 mg via ORAL
  Filled 2021-07-12: qty 2

## 2021-07-12 MED ORDER — BENZONATATE 100 MG PO CAPS: 100.0000 mg | ORAL_CAPSULE | Freq: Three times a day (TID) | ORAL | 0 refills | Status: DC | PRN

## 2021-07-12 MED ORDER — BENZONATATE 100 MG PO CAPS
100.0000 mg | ORAL_CAPSULE | Freq: Once | ORAL | Status: AC
  Administered 2021-07-12: 100 mg via ORAL
  Filled 2021-07-12: qty 1

## 2021-07-12 NOTE — ED Notes (Signed)
Pt maintaining 95% RA with ambulation. MD made aware.

## 2021-07-12 NOTE — Discharge Instructions (Signed)
You may alternate Tylenol 1000 mg every 6 hours as needed for pain, fever and Ibuprofen 800 mg every 8 hours as needed for pain, fever.  Please take Ibuprofen with food.  Do not take more than 4000 mg of Tylenol (acetaminophen) in a 24 hour period.  

## 2021-07-12 NOTE — ED Provider Notes (Signed)
[facility] Emergency Department Provider Note  ____________________________________________   Event Date/Time   First MD Initiated Contact with Patient 07/12/21 0109     (approximate)  I have reviewed the triage vital signs and the nursing notes.   HISTORY  Chief Complaint Dizziness and Cough    HPI Betty Luna is a 70 y.o. female with history of hypertension, diabetes, CHF who presents to the emergency department 2 days of fevers, nonproductive cough, bilateral ear pain, sore throat, body aches.  No known sick contacts.  Denies vomiting or diarrhea.  No chest pain or shortness of breath.  She has had 4 COVID-19 vaccinations.         Past Medical History:  Diagnosis Date   Anemia    Arthritis    CHF (congestive heart failure) (HCC)    Depression    Diabetes mellitus without complication (HCC)    GERD (gastroesophageal reflux disease)    Gout    Hypertension     Patient Active Problem List   Diagnosis Date Noted   Chronic systolic CHF (congestive heart failure) (HCC) 04/03/2021   Cardiomyopathy (HCC) 04/03/2021   Contusion of left hip 04/03/2021   Accidental fall 04/03/2021   Alcohol use 04/03/2021   Closed fracture of base of fifth metatarsal bone    Congestive heart failure, unspecified HF chronicity, unspecified heart failure type (HCC) 12/29/2020   Type 2 diabetes mellitus without complication, with long-term current use of insulin (HCC) 12/29/2020   Shortness of breath 06/22/2019   Uncontrolled hypertension 06/22/2019   SOB (shortness of breath) 06/22/2019   Pyelonephritis 06/26/2017   Type 2 diabetes mellitus with stage 2 chronic kidney disease, with long-term current use of insulin (HCC) 08/08/2015   HTN (hypertension), benign 03/21/2014    Past Surgical History:  Procedure Laterality Date   ABDOMINAL HYSTERECTOMY     CATARACT EXTRACTION W/PHACO Right 04/16/2017   Procedure: CATARACT EXTRACTION PHACO AND INTRAOCULAR LENS  PLACEMENT (IOC);  Surgeon: Galen Manila, MD;  Location: [facility] ORS;  Service: Ophthalmology;  Laterality: Right;  [place] 01:06 AP% 16.5 CDE 11.03 Fluid pack lot # [id number]   CATARACT EXTRACTION W/PHACO Left 10/01/2017   Procedure: CATARACT EXTRACTION PHACO AND INTRAOCULAR LENS PLACEMENT (IOC)-LEFT DIABETIC;  Surgeon: Galen Manila, MD;  Location: [facility] ORS;  Service: Ophthalmology;  Laterality: Left;  [place] 01:05.7 AP% 12.2 CDE 7.42 Fluid pack lot # [id number]   CTR Left    TONSILLECTOMY      Prior to Admission medications   Medication Sig Start Date End Date Taking? Authorizing Provider  benzonatate (TESSALON PERLES) 100 MG capsule Take 1 capsule (100 mg total) by mouth 3 (three) times daily as needed for cough. 07/12/21 07/12/22 Yes Alexxander Kurt, Layla Maw, DO  nirmatrelvir/ritonavir EUA (PAXLOVID) 20 x 150 MG & 10 x 100MG  TABS Take 3 tablets by mouth 2 (two) times daily for 5 days. Patient GFR is 60. Take nirmatrelvir (150 mg) two tablets twice daily for 5 days and ritonavir (100 mg) one tablet twice daily for 5 days. 07/12/21 07/17/21 Yes Charon Akamine N, DO  amLODipine (NORVASC) 10 MG tablet Take 10 mg by mouth daily. 03/24/21   [provider]  aspirin EC 81 MG tablet Take 81 mg by mouth daily.    [provider]  carvedilol (COREG) 25 MG tablet Take 25 mg by mouth 2 (two) times daily.  02/14/15   [provider]  colchicine 0.6 MG tablet Take 0.6 mg by mouth daily as needed.  [provider]  escitalopram (LEXAPRO) 20 MG tablet Take 10 mg by mouth daily. 02/09/21   [provider]  losartan (COZAAR) 100 MG tablet Take 100 mg by mouth daily. 01/13/21   [provider]  metFORMIN (GLUCOPHAGE) 500 MG tablet Take 2 tablets by mouth 2 (two) times daily. 02/09/21   [provider]  oxyCODONE-acetaminophen (PERCOCET) 5-325 MG tablet Take 1 tablet by mouth every 4 (four) hours as needed for severe pain. 04/27/21 04/27/22  Fisher, Roselyn Bering, PA-C   pantoprazole (PROTONIX) 40 MG tablet Take 40 mg by mouth daily. 03/07/17   [provider]  torsemide (DEMADEX) 10 MG tablet Take 10 mg by mouth daily.  03/23/15   [provider]    Allergies Omeprazole, Tramadol, Atorvastatin, and Codeine  Family History  Problem Relation Age of Onset   Alcohol abuse Brother    Breast cancer Sister     Social History Social History   Tobacco Use   Smoking status: Former   Smokeless tobacco: Current    Types: Snuff  Vaping Use   Vaping Use: Never used  Substance Use Topics   Alcohol use: Yes    Comment: occasonally   Drug use: No    Review of Systems Constitutional: + fever. Eyes: No visual changes. ENT: No sore throat. Cardiovascular: Denies chest pain. Respiratory: Denies shortness of breath. Gastrointestinal: No nausea, vomiting, diarrhea. Genitourinary: Negative for dysuria. Musculoskeletal: Negative for back pain. Skin: Negative for rash. Neurological: Negative for focal weakness or numbness.  ____________________________________________   PHYSICAL EXAM:  VITAL SIGNS: ED Triage Vitals  Enc Vitals Group     BP 07/11/21 1908 (!) 148/77     Pulse Rate 07/11/21 1908 (!) 101     Resp 07/11/21 1908 18     Temp 07/11/21 1908 100.3 F (37.9 C)     Temp Source 07/11/21 1908 Oral     SpO2 07/11/21 1908 95 %     Weight 07/11/21 1909 213 lb (96.6 kg)     Height 07/11/21 1909 5\' 2"  (1.575 m)     Head Circumference --      Peak Flow --      Pain Score 07/11/21 1909 0     Pain Loc --      Pain Edu? --      Excl. in GC? --    CONSTITUTIONAL: Alert and oriented and responds appropriately to questions. Well-appearing; well-nourished, nontoxic, well-hydrated HEAD: Normocephalic EYES: Conjunctivae clear, pupils appear equal, EOM appear intact ENT: normal nose; moist mucous membranes NECK: Supple, normal ROM CARD: Regular and minimally tachycardic; S1 and S2 appreciated; no murmurs, no clicks, no rubs, no  gallops RESP: Normal chest excursion without splinting or tachypnea; breath sounds clear and equal bilaterally; no wheezes, no rhonchi, no rales, no hypoxia or respiratory distress, speaking full sentences ABD/GI: Normal bowel sounds; non-distended; soft, non-tender, no rebound, no guarding, no peritoneal signs, no hepatosplenomegaly BACK: The back appears normal EXT: Normal ROM in all joints; no deformity noted, no edema; no cyanosis, no calf tenderness or calf swelling SKIN: Normal color for age and race; warm; no rash on exposed skin NEURO: Moves all extremities equally PSYCH: The patient's mood and manner are appropriate.  ____________________________________________   LABS (all labs ordered are listed, but only abnormal results are displayed)  Labs Reviewed  RESP PANEL BY RT-PCR (FLU A&B, COVID) ARPGX2 - Abnormal; Notable for the following components:      Result Value   SARS Coronavirus 2 by  RT PCR POSITIVE (*)    All other components within normal limits  URINALYSIS, ROUTINE W REFLEX MICROSCOPIC - Abnormal; Notable for the following components:   Ketones, ur TRACE (*)    All other components within normal limits  BASIC METABOLIC PANEL - Abnormal; Notable for the following components:   Potassium 3.3 (*)    Glucose, Bld 140 (*)    All other components within normal limits  CBC WITH DIFFERENTIAL/PLATELET - Abnormal; Notable for the following components:   MCV 76.7 (*)    MCH 25.2 (*)    RDW 16.4 (*)    All other components within normal limits  TROPONIN I (HIGH SENSITIVITY)  TROPONIN I (HIGH SENSITIVITY)   ____________________________________________  EKG   EKG Interpretation  Date/Time:  Tuesday July 11 2021 19:10:55 EDT Ventricular Rate:  100 PR Interval:  156 QRS Duration: 126 QT Interval:  380 QTC Calculation: 490 R Axis:   -35 Text Interpretation: Normal sinus rhythm Possible Left atrial enlargement Left axis deviation Left bundle branch block Abnormal  ECG No significant change since last tracing Confirmed by Rochele Raring [phone]) on 07/12/2021 1:18:31 AM        ____________________________________________  RADIOLOGY Normajean Baxter Tatiyanna Lashley, personally viewed and evaluated these images (plain radiographs) as part of my medical decision making, as well as reviewing the written report by the radiologist.  ED MD interpretation: Chest x-ray clear.  Official radiology report(s): DG Chest 2 View  Result Date: 07/11/2021 CLINICAL DATA:  Productive cough with dizziness and back pain. EXAM: CHEST - 2 VIEW COMPARISON:  November 21, 2020 FINDINGS: Mild, stable, diffusely increased interstitial lung markings are seen. Mild, stable appearing areas of scarring and/or atelectasis are seen within the bilateral lung bases. There is no evidence of a pleural effusion or pneumothorax. The heart size and mediastinal contours are within normal limits. There is marked severity calcification of the aortic arch. Multilevel degenerative changes seen throughout the thoracic spine. IMPRESSION: Mild, stable bibasilar scarring and/or atelectasis. Electronically Signed   By: Aram Candela M.D.   On: 07/11/2021 20:10    ____________________________________________   PROCEDURES  Procedure(s) performed (including Critical Care):  Procedures    ____________________________________________   INITIAL IMPRESSION / ASSESSMENT AND PLAN / ED COURSE  As part of my medical decision making, I reviewed the following data within the electronic MEDICAL RECORD NUMBER History obtained from family, Nursing notes reviewed and incorporated, Labs reviewed , EKG interpreted , Old EKG reviewed, Radiograph reviewed , and Notes from prior ED visits         Patient here with URI symptoms.  Found to be positive for COVID-19.  Given age and multiple comorbidities, discussed paxlovid and patient agrees to this medication.  She has not hypoxic at rest or with ambulation.  No increased work of  breathing.  No signs of COVID-pneumonia on chest x-ray.  She is nontoxic-appearing, well-hydrated.  Discussed supportive care instructions.  Will discharge with Tessalon Perles.  I feel she is safe for discharge home with outpatient follow-up.  Discussed importance of quarantine, isolation.  Patient verbalized understanding and is comfortable with this plan.  At this time, I do not feel there is any life-threatening condition present. I have reviewed, interpreted and discussed all results (EKG, imaging, lab, urine as appropriate) and exam findings with patient/family. I have reviewed nursing notes and appropriate previous records.  I feel the patient is safe to be discharged home without further emergent workup and can continue workup as an outpatient as needed.  Discussed usual and customary return precautions. Patient/family verbalize understanding and are comfortable with this plan.  Outpatient follow-up has been provided as needed. All questions have been answered.  ____________________________________________   FINAL CLINICAL IMPRESSION(S) / ED DIAGNOSES  Final diagnoses:  COVID-19     ED Discharge Orders          Ordered    nirmatrelvir/ritonavir EUA (PAXLOVID) 20 x 150 MG & 10 x 100MG  TABS  2 times daily        07/12/21 0201    benzonatate (TESSALON PERLES) 100 MG capsule  3 times daily PRN        07/12/21 0201            *Please note:  Renette Hsu was evaluated in Emergency Department on 07/12/2021 for the symptoms described in the history of present illness. She was evaluated in the context of the global COVID-19 pandemic, which necessitated consideration that the patient might be at risk for infection with the SARS-CoV-2 virus that causes COVID-19. Institutional protocols and algorithms that pertain to the evaluation of patients at risk for COVID-19 are in a state of rapid change based on information released by regulatory bodies including the CDC and federal and state  organizations. These policies and algorithms were followed during the patient's care in the ED.  Some ED evaluations and interventions may be delayed as a result of limited staffing during and the pandemic.*   Note:  This document was prepared using Dragon voice recognition software and may include unintentional dictation errors.    Earon Rivest, 07/14/2021, DO 07/12/21 [phone]

## 2021-07-18 ENCOUNTER — Inpatient Hospital Stay
Admission: EM | Admit: 2021-07-18 | Discharge: 2021-07-21 | DRG: 872 | Disposition: A | Discharge status: Home / Self Care | Payer: Medicare HMO | Attending: Internal Medicine | Admitting: Internal Medicine

## 2021-07-18 ENCOUNTER — Other Ambulatory Visit: Payer: Self-pay

## 2021-07-18 ENCOUNTER — Emergency Department: Payer: Medicare HMO

## 2021-07-18 DIAGNOSIS — Z87440 Personal history of urinary (tract) infections: Secondary | ICD-10-CM

## 2021-07-18 DIAGNOSIS — Z7982 Long term (current) use of aspirin: Secondary | ICD-10-CM

## 2021-07-18 DIAGNOSIS — A419 Sepsis, unspecified organism: Secondary | ICD-10-CM | POA: Diagnosis present

## 2021-07-18 DIAGNOSIS — R7881 Bacteremia: Secondary | ICD-10-CM

## 2021-07-18 DIAGNOSIS — A4150 Gram-negative sepsis, unspecified: Secondary | ICD-10-CM | POA: Diagnosis present

## 2021-07-18 DIAGNOSIS — E876 Hypokalemia: Secondary | ICD-10-CM | POA: Diagnosis present

## 2021-07-18 DIAGNOSIS — Z888 Allergy status to other drugs, medicaments and biological substances status: Secondary | ICD-10-CM

## 2021-07-18 DIAGNOSIS — F32A Depression, unspecified: Secondary | ICD-10-CM | POA: Diagnosis present

## 2021-07-18 DIAGNOSIS — K219 Gastro-esophageal reflux disease without esophagitis: Secondary | ICD-10-CM | POA: Diagnosis present

## 2021-07-18 DIAGNOSIS — I5022 Chronic systolic (congestive) heart failure: Secondary | ICD-10-CM | POA: Diagnosis present

## 2021-07-18 DIAGNOSIS — Z20822 Contact with and (suspected) exposure to covid-19: Secondary | ICD-10-CM | POA: Diagnosis present

## 2021-07-18 DIAGNOSIS — I429 Cardiomyopathy, unspecified: Secondary | ICD-10-CM | POA: Diagnosis present

## 2021-07-18 DIAGNOSIS — E1165 Type 2 diabetes mellitus with hyperglycemia: Secondary | ICD-10-CM | POA: Diagnosis present

## 2021-07-18 DIAGNOSIS — N390 Urinary tract infection, site not specified: Secondary | ICD-10-CM | POA: Diagnosis present

## 2021-07-18 DIAGNOSIS — Z885 Allergy status to narcotic agent status: Secondary | ICD-10-CM

## 2021-07-18 DIAGNOSIS — Z7984 Long term (current) use of oral hypoglycemic drugs: Secondary | ICD-10-CM

## 2021-07-18 DIAGNOSIS — Z886 Allergy status to analgesic agent status: Secondary | ICD-10-CM

## 2021-07-18 DIAGNOSIS — A4151 Sepsis due to Escherichia coli [E. coli]: Secondary | ICD-10-CM | POA: Diagnosis not present

## 2021-07-18 DIAGNOSIS — Z791 Long term (current) use of non-steroidal anti-inflammatories (NSAID): Secondary | ICD-10-CM

## 2021-07-18 DIAGNOSIS — Z87891 Personal history of nicotine dependence: Secondary | ICD-10-CM

## 2021-07-18 DIAGNOSIS — R6520 Severe sepsis without septic shock: Secondary | ICD-10-CM | POA: Diagnosis present

## 2021-07-18 DIAGNOSIS — Z794 Long term (current) use of insulin: Secondary | ICD-10-CM

## 2021-07-18 DIAGNOSIS — Z79899 Other long term (current) drug therapy: Secondary | ICD-10-CM

## 2021-07-18 DIAGNOSIS — R7989 Other specified abnormal findings of blood chemistry: Secondary | ICD-10-CM | POA: Diagnosis present

## 2021-07-18 DIAGNOSIS — Z8616 Personal history of COVID-19: Secondary | ICD-10-CM

## 2021-07-18 DIAGNOSIS — D509 Iron deficiency anemia, unspecified: Secondary | ICD-10-CM

## 2021-07-18 DIAGNOSIS — I110 Hypertensive heart disease with heart failure: Secondary | ICD-10-CM | POA: Diagnosis present

## 2021-07-18 DIAGNOSIS — M109 Gout, unspecified: Secondary | ICD-10-CM | POA: Diagnosis present

## 2021-07-18 DIAGNOSIS — E119 Type 2 diabetes mellitus without complications: Secondary | ICD-10-CM

## 2021-07-18 LAB — COMPREHENSIVE METABOLIC PANEL
ALT: 47 U/L — ABNORMAL HIGH (ref 0–44)
AST: 55 U/L — ABNORMAL HIGH (ref 15–41)
Albumin: 3.4 g/dL — ABNORMAL LOW (ref 3.5–5.0)
Alkaline Phosphatase: 118 U/L (ref 38–126)
Anion gap: 13 (ref 5–15)
BUN: 12 mg/dL (ref 8–23)
CO2: 25 mmol/L (ref 22–32)
Calcium: 9 mg/dL (ref 8.9–10.3)
Chloride: 100 mmol/L (ref 98–111)
Creatinine, Ser: 0.92 mg/dL (ref 0.44–1.00)
GFR, Estimated: >60 mL/min (ref >60)
Glucose, Bld: 269 mg/dL — ABNORMAL HIGH (ref 70–99)
Potassium: 3.1 mmol/L — ABNORMAL LOW (ref 3.5–5.1)
Sodium: 138 mmol/L (ref 135–145)
Total Bilirubin: 1.3 mg/dL — ABNORMAL HIGH (ref 0.3–1.2)
Total Protein: 6.9 g/dL (ref 6.5–8.1)

## 2021-07-18 LAB — CBC WITH DIFFERENTIAL/PLATELET
Abs Immature Granulocytes: 0.02 10*3/uL (ref 0.00–0.07)
Basophils Absolute: 0 10*3/uL (ref 0.0–0.1)
Basophils Relative: 1 %
Eosinophils Absolute: 0 10*3/uL (ref 0.0–0.5)
Eosinophils Relative: 0 %
HCT: 37 % (ref 36.0–46.0)
Hemoglobin: 12.3 g/dL (ref 12.0–15.0)
Immature Granulocytes: 1 %
Lymphocytes Relative: 6 %
Lymphs Abs: 0.2 10*3/uL — ABNORMAL LOW (ref 0.7–4.0)
MCH: 25.1 pg — ABNORMAL LOW (ref 26.0–34.0)
MCHC: 33.2 g/dL (ref 30.0–36.0)
MCV: 75.4 fL — ABNORMAL LOW (ref 80.0–100.0)
Monocytes Absolute: 0 10*3/uL — ABNORMAL LOW (ref 0.1–1.0)
Monocytes Relative: 1 %
Neutro Abs: 3.1 10*3/uL (ref 1.7–7.7)
Neutrophils Relative %: 91 %
Platelets: 188 10*3/uL (ref 150–400)
RBC: 4.91 MIL/uL (ref 3.87–5.11)
RDW: 16.5 % — ABNORMAL HIGH (ref 11.5–15.5)
WBC: 3.3 10*3/uL — ABNORMAL LOW (ref 4.0–10.5)
nRBC: 0 % (ref 0.0–0.2)

## 2021-07-18 LAB — URINALYSIS, COMPLETE (UACMP) WITH MICROSCOPIC
Bilirubin Urine: NEGATIVE
Glucose, UA: 50 mg/dL — AB
Ketones, ur: NEGATIVE mg/dL
Nitrite: NEGATIVE
Protein, ur: 100 mg/dL — AB
Specific Gravity, Urine: 1.012 (ref 1.005–1.030)
WBC, UA: >50 WBC/hpf — ABNORMAL HIGH (ref 0–5)
pH: 5 (ref 5.0–8.0)

## 2021-07-18 LAB — LIPASE, BLOOD: Lipase: 23 U/L (ref 11–51)

## 2021-07-18 LAB — LACTIC ACID, PLASMA: Lactic Acid, Venous: 5.6 mmol/L (ref 0.5–1.9)

## 2021-07-18 MED ORDER — ACETAMINOPHEN 325 MG PO TABS
ORAL_TABLET | ORAL | Status: AC
  Administered 2021-07-18: 325 mg via ORAL
  Filled 2021-07-18: qty 1

## 2021-07-18 MED ORDER — LACTATED RINGERS IV SOLN: INTRAVENOUS | Status: DC

## 2021-07-18 MED ORDER — CEFTRIAXONE SODIUM 2 G IJ SOLR
2.0000 g | INTRAMUSCULAR | Status: DC
Start: 2021-07-18 — End: 2021-07-20
  Administered 2021-07-19 (×2): 2 g via INTRAVENOUS
  Filled 2021-07-18 (×3): qty 20

## 2021-07-18 MED ORDER — LACTATED RINGERS IV BOLUS (SEPSIS)
1000.0000 mL | Freq: Once | INTRAVENOUS | Status: AC
  Administered 2021-07-19: 1000 mL via INTRAVENOUS

## 2021-07-18 MED ORDER — ACETAMINOPHEN 325 MG PO TABS: 325.0000 mg | ORAL_TABLET | Freq: Once | ORAL | Status: AC

## 2021-07-18 MED ORDER — LACTATED RINGERS IV BOLUS (SEPSIS)
1000.0000 mL | Freq: Once | INTRAVENOUS | Status: AC
  Administered 2021-07-18: 1000 mL via INTRAVENOUS

## 2021-07-18 NOTE — ED Triage Notes (Signed)
Pt in with co n.v.d since yesterday with mid abd pain and low back pain. Pt also co dysuria states hx of frequent UTI's. States took 1 tylenol for fever 1 hr PTA.,

## 2021-07-18 NOTE — ED Provider Notes (Signed)
Emergency Medicine Provider Triage Evaluation Note  Betty Luna , a 70 y.o. female  was evaluated in triage.  With a history of frequent urinary tract infections, presents to the emergency department with bilateral low back pain, nausea, vomiting and malaise at home.  Patient states that she has had nephrolithiasis in the past.  Denies chest pain, chest tightness or shortness of breath.  Patient states that she recently recovered from COVID-19. Review of Systems  Positive: Patient has flank pain, dysuria and nausea and vomiting. Negative: No chest pain, chest tightness or abdominal pain.  Physical Exam  There were no vitals taken for this visit. Gen:   Awake, no distress   Resp:  Normal effort  MSK:   Moves extremities without difficulty  Other:  Patient has bilateral CVA tenderness.  Medical Decision Making  Medically screening exam initiated at 8:26 PM.  Appropriate orders placed.  Rusty Glodowski was informed that the remainder of the evaluation will be completed by another provider, this initial triage assessment does not replace that evaluation, and the importance of remaining in the ED until their evaluation is complete.  Assessment and plan Flank pain Nausea  70 year old female presents to the emergency department with flank pain, nausea, vomiting and dysuria.  Will obtain basic labs as well as urinalysis and CT renal stone study and will reassess.   Pia Mau Cornelius, Cordelia Poche 07/18/21 2028    Sharyn Creamer, MD 07/18/21 [phone]

## 2021-07-18 NOTE — ED Notes (Signed)
Lab reports lactic 5.6; acuity level changed & charge nurse notified

## 2021-07-18 NOTE — ED Provider Notes (Signed)
[facility] Emergency Department Provider Note  ____________________________________________   Event Date/Time   First MD Initiated Contact with Patient 07/18/21 2257     (approximate)  I have reviewed the triage vital signs and the nursing notes.   HISTORY  Chief Complaint Abdominal Pain    HPI Betty Luna is a 70 y.o. female with history of hypertension, diabetes, CHF who presents to the emergency department with complaints of 1 day of lower abdominal pain that radiates into her lower back, dysuria, nausea, vomiting and diarrhea.  Has also had fever.  Patient was seen 07/12/21 for URI symptoms and was diagnosed with COVID-19 and took 5 days of Paxlovid.  She denies having any chest pain, shortness of breath, cough currently.        Past Medical History:  Diagnosis Date   Anemia    Arthritis    CHF (congestive heart failure) (HCC)    Depression    Diabetes mellitus without complication (HCC)    GERD (gastroesophageal reflux disease)    Gout    Hypertension     Patient Active Problem List   Diagnosis Date Noted   Sepsis due to gram-negative UTI (HCC) 07/19/2021   Chronic systolic CHF (congestive heart failure) (HCC) 04/03/2021   Cardiomyopathy (HCC) 04/03/2021   Contusion of left hip 04/03/2021   Accidental fall 04/03/2021   Alcohol use 04/03/2021   Closed fracture of base of fifth metatarsal bone    Congestive heart failure, unspecified HF chronicity, unspecified heart failure type (HCC) 12/29/2020   Type 2 diabetes mellitus without complication, with long-term current use of insulin (HCC) 12/29/2020   Shortness of breath 06/22/2019   Uncontrolled hypertension 06/22/2019   SOB (shortness of breath) 06/22/2019   Pyelonephritis 06/26/2017   Type 2 diabetes mellitus with stage 2 chronic kidney disease, with long-term current use of insulin (HCC) 08/08/2015   HTN (hypertension), benign 03/21/2014    Past Surgical History:  Procedure  Laterality Date   ABDOMINAL HYSTERECTOMY     CATARACT EXTRACTION W/PHACO Right 04/16/2017   Procedure: CATARACT EXTRACTION PHACO AND INTRAOCULAR LENS PLACEMENT (IOC);  Surgeon: Galen Manila, MD;  Location: [facility] ORS;  Service: Ophthalmology;  Laterality: Right;  [place] 01:06 AP% 16.5 CDE 11.03 Fluid pack lot # [id number]   CATARACT EXTRACTION W/PHACO Left 10/01/2017   Procedure: CATARACT EXTRACTION PHACO AND INTRAOCULAR LENS PLACEMENT (IOC)-LEFT DIABETIC;  Surgeon: Galen Manila, MD;  Location: [facility] ORS;  Service: Ophthalmology;  Laterality: Left;  [place] 01:05.7 AP% 12.2 CDE 7.42 Fluid pack lot # [id number]   CTR Left    TONSILLECTOMY      Prior to Admission medications   Medication Sig Start Date End Date Taking? Authorizing Provider  amLODipine (NORVASC) 10 MG tablet Take 10 mg by mouth daily. 03/24/21  Yes [provider]  aspirin EC 81 MG tablet Take 81 mg by mouth daily.   Yes [provider]  benzonatate (TESSALON PERLES) 100 MG capsule Take 1 capsule (100 mg total) by mouth 3 (three) times daily as needed for cough. 07/12/21 07/12/22 Yes Tanashia Ciesla N, DO  carvedilol (COREG) 25 MG tablet Take 25 mg by mouth 2 (two) times daily.  02/14/15  Yes [provider]  colchicine 0.6 MG tablet Take 0.6 mg by mouth daily as needed.   Yes [provider]  ergocalciferol (VITAMIN D2) 1.25 MG (50000 UT) capsule ergocalciferol (vitamin D2) 1,250 mcg (50,000 unit) capsule  TAKE 1 CAPSULE BY MOUTH WEEKLY FOR 8 WEEKS   Yes  [provider]  escitalopram (LEXAPRO) 20 MG tablet Take 10 mg by mouth daily. 02/09/21  Yes [provider]  losartan (COZAAR) 100 MG tablet Take 100 mg by mouth daily. 01/13/21  Yes [provider]  meloxicam (MOBIC) 7.5 MG tablet Take 7.5 mg by mouth daily. 06/16/21  Yes [provider]  metFORMIN (GLUCOPHAGE) 500 MG tablet Take 2 tablets by mouth 2 (two) times daily. 02/09/21  Yes [provider]   pantoprazole (PROTONIX) 40 MG tablet Take 40 mg by mouth daily. 03/07/17  Yes [provider]  torsemide (DEMADEX) 20 MG tablet Take 20 mg by mouth daily. 06/07/21  Yes [provider]    Allergies Omeprazole, Tramadol, Atorvastatin, and Codeine  Family History  Problem Relation Age of Onset   Alcohol abuse Brother    Breast cancer Sister     Social History Social History   Tobacco Use   Smoking status: Former   Smokeless tobacco: Current    Types: Snuff  Vaping Use   Vaping Use: Never used  Substance Use Topics   Alcohol use: Yes    Comment: occasonally   Drug use: No    Review of Systems Constitutional: + fever. Eyes: No visual changes. ENT: No sore throat. Cardiovascular: Denies chest pain. Respiratory: Denies shortness of breath. Gastrointestinal: + nausea, vomiting, diarrhea. Genitourinary: Negative for dysuria. Musculoskeletal: Negative for back pain. Skin: Negative for rash. Neurological: Negative for focal weakness or numbness.  ____________________________________________   PHYSICAL EXAM:  VITAL SIGNS: ED Triage Vitals [07/18/21 2030]  Enc Vitals Group     BP 101/62     Pulse Rate (!) 112     Resp 20     Temp (!) 100.6 F (38.1 C)     Temp Source Oral     SpO2 96 %     Weight 213 lb (96.6 kg)     Height 5\' 1"  (1.549 m)     Head Circumference      Peak Flow      Pain Score 9     Pain Loc      Pain Edu?      Excl. in GC?    CONSTITUTIONAL: Alert and oriented and responds appropriately to questions. Well-appearing; well-nourished, nontoxic HEAD: Normocephalic EYES: Conjunctivae clear, pupils appear equal, EOM appear intact ENT: normal nose; moist mucous membranes NECK: Supple, normal ROM CARD: Regular and minimally tachycardic; S1 and S2 appreciated; no murmurs, no clicks, no rubs, no gallops RESP: Normal chest excursion without splinting or tachypnea; breath sounds clear and equal bilaterally; no wheezes, no rhonchi, no  rales, no hypoxia or respiratory distress, speaking full sentences ABD/GI: Normal bowel sounds; non-distended; soft, mildly tender throughout the lower abdomen, no rebound, no guarding, no peritoneal signs, no hepatosplenomegaly BACK: The back appears normal EXT: Normal ROM in all joints; no deformity noted, no edema; no cyanosis SKIN: Normal color for age and race; warm; no rash on exposed skin NEURO: Moves all extremities equally PSYCH: The patient's mood and manner are appropriate.  ____________________________________________   LABS (all labs ordered are listed, but only abnormal results are displayed)  Labs Reviewed  CBC WITH DIFFERENTIAL/PLATELET - Abnormal; Notable for the following components:      Result Value   WBC 3.3 (*)    MCV 75.4 (*)    MCH 25.1 (*)    RDW 16.5 (*)    Lymphs Abs 0.2 (*)    Monocytes Absolute 0.0 (*)    All other components within  normal limits  COMPREHENSIVE METABOLIC PANEL - Abnormal; Notable for the following components:   Potassium 3.1 (*)    Glucose, Bld 269 (*)    Albumin 3.4 (*)    AST 55 (*)    ALT 47 (*)    Total Bilirubin 1.3 (*)    All other components within normal limits  LACTIC ACID, PLASMA - Abnormal; Notable for the following components:   Lactic Acid, Venous 5.6 (*)    All other components within normal limits  LACTIC ACID, PLASMA - Abnormal; Notable for the following components:   Lactic Acid, Venous 4.6 (*)    All other components within normal limits  URINALYSIS, COMPLETE (UACMP) WITH MICROSCOPIC - Abnormal; Notable for the following components:   Color, Urine YELLOW (*)    APPearance CLOUDY (*)    Glucose, UA 50 (*)    Hgb urine dipstick SMALL (*)    Protein, ur 100 (*)    Leukocytes,Ua LARGE (*)    WBC, UA >50 (*)    Bacteria, UA RARE (*)    All other components within normal limits  URINE CULTURE  CULTURE, BLOOD (ROUTINE X 2)  CULTURE, BLOOD (ROUTINE X 2)  LIPASE, BLOOD  LACTIC ACID, PLASMA    ____________________________________________  EKG   Date: 07/18/2021  Rate: 97  Rhythm: normal sinus rhythm  QRS Axis: normal  Intervals: LBBB  ST/T Wave abnormalities: normal  Conduction Disutrbances: LBBB  Narrative Interpretation: LBBB, no change compared to previous EKG    ____________________________________________  RADIOLOGY I, Tristen Pennino, personally viewed and evaluated these images (plain radiographs) as part of my medical decision making, as well as reviewing the written report by the radiologist.  ED MD interpretation: CT scan shows no pyelonephritis, ureterolithiasis.  She has diverticulosis without diverticulitis.  Official radiology report(s): CT Renal Stone Study  Result Date: 07/18/2021 CLINICAL DATA:  70 year old female with history of flank pain. Evaluate for kidney stone. EXAM: CT ABDOMEN AND PELVIS WITHOUT CONTRAST TECHNIQUE: Multidetector CT imaging of the abdomen and pelvis was performed following the standard protocol without IV contrast. COMPARISON:  CT of the abdomen and pelvis 04/03/2021. FINDINGS: Lower chest: Mild scarring in the lung bases bilaterally. Atherosclerotic calcifications in the thoracic aorta as well as the left anterior descending, left circumflex and right coronary arteries. Calcifications of the aortic valve and mitral annulus. Hepatobiliary: Mild diffuse low attenuation throughout the hepatic parenchyma, indicative of hepatic steatosis. No discrete cystic or solid hepatic lesions are confidently identified on today's noncontrast CT examination. Unenhanced appearance of the gallbladder is unremarkable. Pancreas: No definite pancreatic mass or peripancreatic fluid collections or inflammatory changes are noted on today's noncontrast CT examination. Spleen: Unremarkable. Adrenals/Urinary Tract: Nonobstructive calculus in the lower pole collecting system of the left kidney measuring 6 mm. No additional calculi are noted within the right renal  collecting system, along the course of either ureter or within the lumen of the urinary bladder. No hydroureteronephrosis. Unenhanced appearance of the kidneys, bilateral adrenal glands and urinary bladder is otherwise unremarkable. Stomach/Bowel: Unenhanced appearance of the stomach is normal. No pathologic dilatation of small bowel or colon. A few scattered colonic diverticulae are noted, without surrounding inflammatory changes to suggest an acute diverticulitis at this time. Normal appendix. Vascular/Lymphatic: Aortic atherosclerosis. No lymphadenopathy noted in the abdomen or pelvis. Reproductive: Status post hysterectomy. Ovaries are not confidently identified may be surgically absent or atrophic. Other: No significant volume of ascites.  No pneumoperitoneum. Musculoskeletal: There are no aggressive appearing lytic or blastic lesions noted  in the visualized portions of the skeleton. IMPRESSION: 1. 6 mm nonobstructive calculus in the lower pole collecting system of left kidney. No ureteral stones or findings of urinary tract obstruction are noted at this time. 2. Hepatic steatosis. 3. Colonic diverticulosis without evidence of acute diverticulitis at this time. 4. Aortic atherosclerosis, in addition to at least 3 vessel coronary artery disease. Please note that although the presence of coronary artery calcium documents the presence of coronary artery disease, the severity of this disease and any potential stenosis cannot be assessed on this non-gated CT examination. Assessment for potential risk factor modification, dietary therapy or pharmacologic therapy may be warranted, if clinically indicated. 5. There are calcifications of the aortic valve and mitral annulus. Echocardiographic correlation for evaluation of potential valvular dysfunction may be warranted if clinically indicated. Electronically Signed   By: Trudie Reed M.D.   On: 07/18/2021 20:43     ____________________________________________   PROCEDURES  Procedure(s) performed (including Critical Care):  Procedures  CRITICAL CARE Performed by: Baxter Hire Burech Mcfarland   Total critical care time: 45 minutes  Critical care time was exclusive of separately billable procedures and treating other patients.  Critical care was necessary to treat or prevent imminent or life-threatening deterioration.  Critical care was time spent personally by me on the following activities: development of treatment plan with patient and/or surrogate as well as nursing, discussions with consultants, evaluation of patient's response to treatment, examination of patient, obtaining history from patient or surrogate, ordering and performing treatments and interventions, ordering and review of laboratory studies, ordering and review of radiographic studies, pulse oximetry and re-evaluation of patient's condition.  ____________________________________________   INITIAL IMPRESSION / ASSESSMENT AND PLAN / ED COURSE  As part of my medical decision making, I reviewed the following data within the electronic MEDICAL RECORD NUMBER History obtained from family, Nursing notes reviewed and incorporated, Labs reviewed , EKG interpreted , Old EKG reviewed, Old chart reviewed, Radiograph reviewed , Discussed with admitting physician , CT reviewed, and Notes from prior ED visits         Patient here with complaints of abdominal pain, nausea, vomiting, diarrhea, dysuria.  Febrile, tachycardic here but with normal blood pressures.  Has an elevated lactic.  Appears to have a urinary tract infection.  Urine culture, blood cultures pending.  We will give 30 mL/kg IV fluid bolus, Rocephin.  Also recently treated for COVID-19.  Denies cough, chest pain or shortness of breath.  Sats 96% on room air at rest.  Will obtain chest x-ray.  Patient will need admission.  ED PROGRESS  Patient continues to be hemodynamically stable.  Repeat  lactate downtrending and this is before any IV fluids started.  Will give 3 L of IV fluids and then recheck her lactic.  Chest x-ray on my evaluation shows no sign of pneumonia.  Awaiting official read from radiologist.  Will discuss with hospitalist for admission.   1:31 AM Discussed patient's case with hospitalist, Dr. Arville Care.  I have recommended admission and patient (and family if present) agree with this plan. Admitting physician will place admission orders.   I reviewed all nursing notes, vitals, pertinent previous records and reviewed/interpreted all EKGs, lab and urine results, imaging (as available).  ____________________________________________   FINAL CLINICAL IMPRESSION(S) / ED DIAGNOSES  Final diagnoses:  Acute UTI  Acute sepsis [facility])     ED Discharge Orders     None       *Please note:  Emillia Weatherly was evaluated in Emergency Department  on 07/19/2021 for the symptoms described in the history of present illness. She was evaluated in the context of the global COVID-19 pandemic, which necessitated consideration that the patient might be at risk for infection with the SARS-CoV-2 virus that causes COVID-19. Institutional protocols and algorithms that pertain to the evaluation of patients at risk for COVID-19 are in a state of rapid change based on information released by regulatory bodies including the CDC and federal and state organizations. These policies and algorithms were followed during the patient's care in the ED.  Some ED evaluations and interventions may be delayed as a result of limited staffing during and the pandemic.*   Note:  This document was prepared using Dragon voice recognition software and may include unintentional dictation errors.    Lejla Moeser, Layla Maw, DO 07/19/21 [phone]

## 2021-07-19 ENCOUNTER — Emergency Department: Payer: Medicare HMO

## 2021-07-19 DIAGNOSIS — Z8616 Personal history of COVID-19: Secondary | ICD-10-CM | POA: Diagnosis not present

## 2021-07-19 DIAGNOSIS — F32A Depression, unspecified: Secondary | ICD-10-CM | POA: Diagnosis present

## 2021-07-19 DIAGNOSIS — I110 Hypertensive heart disease with heart failure: Secondary | ICD-10-CM | POA: Diagnosis present

## 2021-07-19 DIAGNOSIS — E876 Hypokalemia: Secondary | ICD-10-CM

## 2021-07-19 DIAGNOSIS — A419 Sepsis, unspecified organism: Secondary | ICD-10-CM

## 2021-07-19 DIAGNOSIS — Z79899 Other long term (current) drug therapy: Secondary | ICD-10-CM | POA: Diagnosis not present

## 2021-07-19 DIAGNOSIS — E119 Type 2 diabetes mellitus without complications: Secondary | ICD-10-CM

## 2021-07-19 DIAGNOSIS — A4150 Gram-negative sepsis, unspecified: Secondary | ICD-10-CM

## 2021-07-19 DIAGNOSIS — Z7982 Long term (current) use of aspirin: Secondary | ICD-10-CM | POA: Diagnosis not present

## 2021-07-19 DIAGNOSIS — K219 Gastro-esophageal reflux disease without esophagitis: Secondary | ICD-10-CM | POA: Diagnosis present

## 2021-07-19 DIAGNOSIS — Z885 Allergy status to narcotic agent status: Secondary | ICD-10-CM | POA: Diagnosis not present

## 2021-07-19 DIAGNOSIS — Z20822 Contact with and (suspected) exposure to covid-19: Secondary | ICD-10-CM | POA: Diagnosis present

## 2021-07-19 DIAGNOSIS — R7881 Bacteremia: Secondary | ICD-10-CM | POA: Diagnosis not present

## 2021-07-19 DIAGNOSIS — N390 Urinary tract infection, site not specified: Secondary | ICD-10-CM

## 2021-07-19 DIAGNOSIS — I429 Cardiomyopathy, unspecified: Secondary | ICD-10-CM | POA: Diagnosis present

## 2021-07-19 DIAGNOSIS — R7989 Other specified abnormal findings of blood chemistry: Secondary | ICD-10-CM | POA: Diagnosis present

## 2021-07-19 DIAGNOSIS — Z87891 Personal history of nicotine dependence: Secondary | ICD-10-CM | POA: Diagnosis not present

## 2021-07-19 DIAGNOSIS — E1165 Type 2 diabetes mellitus with hyperglycemia: Secondary | ICD-10-CM | POA: Diagnosis present

## 2021-07-19 DIAGNOSIS — Z886 Allergy status to analgesic agent status: Secondary | ICD-10-CM | POA: Diagnosis not present

## 2021-07-19 DIAGNOSIS — Z888 Allergy status to other drugs, medicaments and biological substances status: Secondary | ICD-10-CM | POA: Diagnosis not present

## 2021-07-19 DIAGNOSIS — M109 Gout, unspecified: Secondary | ICD-10-CM | POA: Diagnosis present

## 2021-07-19 DIAGNOSIS — A4151 Sepsis due to Escherichia coli [E. coli]: Secondary | ICD-10-CM | POA: Diagnosis present

## 2021-07-19 DIAGNOSIS — Z794 Long term (current) use of insulin: Secondary | ICD-10-CM

## 2021-07-19 DIAGNOSIS — R6520 Severe sepsis without septic shock: Secondary | ICD-10-CM

## 2021-07-19 DIAGNOSIS — D509 Iron deficiency anemia, unspecified: Secondary | ICD-10-CM | POA: Diagnosis present

## 2021-07-19 DIAGNOSIS — Z7984 Long term (current) use of oral hypoglycemic drugs: Secondary | ICD-10-CM | POA: Diagnosis not present

## 2021-07-19 DIAGNOSIS — I10 Essential (primary) hypertension: Secondary | ICD-10-CM

## 2021-07-19 DIAGNOSIS — Z791 Long term (current) use of non-steroidal anti-inflammatories (NSAID): Secondary | ICD-10-CM | POA: Diagnosis not present

## 2021-07-19 DIAGNOSIS — Z87440 Personal history of urinary (tract) infections: Secondary | ICD-10-CM | POA: Diagnosis not present

## 2021-07-19 DIAGNOSIS — I5022 Chronic systolic (congestive) heart failure: Secondary | ICD-10-CM | POA: Diagnosis present

## 2021-07-19 LAB — CBC
HCT: 35.1 % — ABNORMAL LOW (ref 36.0–46.0)
Hemoglobin: 11.6 g/dL — ABNORMAL LOW (ref 12.0–15.0)
MCH: 25.2 pg — ABNORMAL LOW (ref 26.0–34.0)
MCHC: 33 g/dL (ref 30.0–36.0)
MCV: 76.1 fL — ABNORMAL LOW (ref 80.0–100.0)
Platelets: 171 10*3/uL (ref 150–400)
RBC: 4.61 MIL/uL (ref 3.87–5.11)
RDW: 16.8 % — ABNORMAL HIGH (ref 11.5–15.5)
WBC: 9.4 10*3/uL (ref 4.0–10.5)
nRBC: 0.2 % (ref 0.0–0.2)

## 2021-07-19 LAB — BASIC METABOLIC PANEL
Anion gap: 14 (ref 5–15)
BUN: 12 mg/dL (ref 8–23)
CO2: 27 mmol/L (ref 22–32)
Calcium: 9 mg/dL (ref 8.9–10.3)
Chloride: 98 mmol/L (ref 98–111)
Creatinine, Ser: 0.73 mg/dL (ref 0.44–1.00)
GFR, Estimated: >60 mL/min (ref >60)
Glucose, Bld: 184 mg/dL — ABNORMAL HIGH (ref 70–99)
Potassium: 3.8 mmol/L (ref 3.5–5.1)
Sodium: 139 mmol/L (ref 135–145)

## 2021-07-19 LAB — PROCALCITONIN: Procalcitonin: 17.47 ng/mL

## 2021-07-19 LAB — LACTIC ACID, PLASMA
Lactic Acid, Venous: 3.9 mmol/L (ref 0.5–1.9)
Lactic Acid, Venous: 4.6 mmol/L (ref 0.5–1.9)

## 2021-07-19 LAB — MAGNESIUM: Magnesium: 0.8 mg/dL — CL (ref 1.7–2.4)

## 2021-07-19 LAB — PROTIME-INR
INR: 1.2 (ref 0.8–1.2)
Prothrombin Time: 14.7 seconds (ref 11.4–15.2)

## 2021-07-19 LAB — CORTISOL-AM, BLOOD: Cortisol - AM: 48.4 ug/dL — ABNORMAL HIGH (ref 6.7–22.6)

## 2021-07-19 MED ORDER — TRAZODONE HCL 50 MG PO TABS: 25.0000 mg | ORAL_TABLET | Freq: Every evening | ORAL | Status: DC | PRN

## 2021-07-19 MED ORDER — ACETAMINOPHEN 650 MG RE SUPP: 650.0000 mg | Freq: Four times a day (QID) | RECTAL | Status: DC | PRN

## 2021-07-19 MED ORDER — VITAMIN D (ERGOCALCIFEROL) 1.25 MG (50000 UNIT) PO CAPS
50000.0000 [IU] | ORAL_CAPSULE | ORAL | Status: DC
  Administered 2021-07-19: 50000 [IU] via ORAL
  Filled 2021-07-19: qty 1

## 2021-07-19 MED ORDER — COLCHICINE 0.6 MG PO TABS
0.6000 mg | ORAL_TABLET | Freq: Every day | ORAL | Status: DC | PRN
  Filled 2021-07-19 (×2): qty 1

## 2021-07-19 MED ORDER — ENOXAPARIN SODIUM 60 MG/0.6ML IJ SOSY
0.5000 mg/kg | PREFILLED_SYRINGE | INTRAMUSCULAR | Status: DC
  Administered 2021-07-19: 47.5 mg via SUBCUTANEOUS
  Filled 2021-07-19 (×2): qty 0.6

## 2021-07-19 MED ORDER — ONDANSETRON HCL 4 MG/2ML IJ SOLN: 4.0000 mg | Freq: Four times a day (QID) | INTRAMUSCULAR | Status: DC | PRN

## 2021-07-19 MED ORDER — CEFTRIAXONE SODIUM 2 G IJ SOLR: 2.0000 g | INTRAMUSCULAR | Status: DC

## 2021-07-19 MED ORDER — TORSEMIDE 20 MG PO TABS
20.0000 mg | ORAL_TABLET | Freq: Every day | ORAL | Status: DC
  Administered 2021-07-19: 20 mg via ORAL
  Filled 2021-07-19 (×2): qty 1

## 2021-07-19 MED ORDER — ASPIRIN EC 81 MG PO TBEC
81.0000 mg | DELAYED_RELEASE_TABLET | Freq: Every day | ORAL | Status: DC
  Administered 2021-07-19 – 2021-07-21 (×3): 81 mg via ORAL
  Filled 2021-07-19 (×3): qty 1

## 2021-07-19 MED ORDER — PANTOPRAZOLE SODIUM 40 MG PO TBEC
40.0000 mg | DELAYED_RELEASE_TABLET | Freq: Every day | ORAL | Status: DC
  Administered 2021-07-19 – 2021-07-21 (×3): 40 mg via ORAL
  Filled 2021-07-19 (×3): qty 1

## 2021-07-19 MED ORDER — AMLODIPINE BESYLATE 10 MG PO TABS
10.0000 mg | ORAL_TABLET | Freq: Every day | ORAL | Status: DC
  Administered 2021-07-19 – 2021-07-21 (×3): 10 mg via ORAL
  Filled 2021-07-19: qty 1
  Filled 2021-07-19: qty 2
  Filled 2021-07-19: qty 1

## 2021-07-19 MED ORDER — ACETAMINOPHEN 325 MG PO TABS
650.0000 mg | ORAL_TABLET | Freq: Four times a day (QID) | ORAL | Status: DC | PRN
  Administered 2021-07-19 – 2021-07-20 (×2): 650 mg via ORAL
  Filled 2021-07-19 (×2): qty 2

## 2021-07-19 MED ORDER — LOSARTAN POTASSIUM 50 MG PO TABS
100.0000 mg | ORAL_TABLET | Freq: Every day | ORAL | Status: DC
  Administered 2021-07-19 – 2021-07-21 (×3): 100 mg via ORAL
  Filled 2021-07-19 (×3): qty 2

## 2021-07-19 MED ORDER — POTASSIUM CHLORIDE IN NACL 20-0.9 MEQ/L-% IV SOLN
INTRAVENOUS | Status: DC
  Filled 2021-07-19: qty 1000

## 2021-07-19 MED ORDER — POTASSIUM CHLORIDE 20 MEQ PO PACK
40.0000 meq | PACK | Freq: Once | ORAL | Status: AC
  Administered 2021-07-19: 40 meq via ORAL
  Filled 2021-07-19: qty 2

## 2021-07-19 MED ORDER — BENZONATATE 100 MG PO CAPS
100.0000 mg | ORAL_CAPSULE | Freq: Three times a day (TID) | ORAL | Status: DC | PRN
  Administered 2021-07-20: 100 mg via ORAL
  Filled 2021-07-19: qty 1

## 2021-07-19 MED ORDER — ONDANSETRON HCL 4 MG PO TABS: 4.0000 mg | ORAL_TABLET | Freq: Four times a day (QID) | ORAL | Status: DC | PRN

## 2021-07-19 MED ORDER — ENOXAPARIN SODIUM 40 MG/0.4ML IJ SOSY: 40.0000 mg | PREFILLED_SYRINGE | INTRAMUSCULAR | Status: DC

## 2021-07-19 MED ORDER — TORSEMIDE 20 MG PO TABS: 20.0000 mg | ORAL_TABLET | Freq: Every day | ORAL | Status: DC

## 2021-07-19 MED ORDER — ESCITALOPRAM OXALATE 10 MG PO TABS
10.0000 mg | ORAL_TABLET | Freq: Every day | ORAL | Status: DC
  Administered 2021-07-19 – 2021-07-21 (×3): 10 mg via ORAL
  Filled 2021-07-19 (×3): qty 1

## 2021-07-19 MED ORDER — MAGNESIUM HYDROXIDE 400 MG/5ML PO SUSP
30.0000 mL | Freq: Every day | ORAL | Status: DC | PRN
  Filled 2021-07-19: qty 30

## 2021-07-19 MED ORDER — CARVEDILOL 25 MG PO TABS
25.0000 mg | ORAL_TABLET | Freq: Two times a day (BID) | ORAL | Status: DC
  Administered 2021-07-19 – 2021-07-21 (×5): 25 mg via ORAL
  Filled 2021-07-19: qty 1
  Filled 2021-07-19: qty 4
  Filled 2021-07-19 (×3): qty 1

## 2021-07-19 MED ORDER — MELOXICAM 7.5 MG PO TABS
7.5000 mg | ORAL_TABLET | Freq: Every day | ORAL | Status: DC
  Administered 2021-07-19 – 2021-07-21 (×3): 7.5 mg via ORAL
  Filled 2021-07-19 (×3): qty 1

## 2021-07-19 NOTE — ED Notes (Addendum)
This RN put socks on patient. Patient unhooked to go to restroom. Able to ambulate independently with no issues to restroom

## 2021-07-19 NOTE — Progress Notes (Signed)
Anticoagulation monitoring(Lovenox):  70 yo female ordered Lovenox 40 mg Q24h    Filed Weights   07/18/21 2030  Weight: 96.6 kg (213 lb)   BMI 40   Lab Results  Component Value Date   CREATININE 0.92 07/18/2021   CREATININE 0.73 07/11/2021   CREATININE 0.73 04/03/2021   Estimated Creatinine Clearance: 61.3 mL/min (by C-G formula based on SCr of 0.92 mg/dL). Hemoglobin & Hematocrit     Component Value Date/Time   HGB 12.3 07/18/2021 2032   HGB 11.8 (L) 09/02/2014 0829   HCT 37.0 07/18/2021 2032   HCT 37.6 09/02/2014 0829     Per Protocol for Patient with estCrcl > 30 ml/min and BMI > 30, will transition to Lovenox 47.5 mg Q24h.

## 2021-07-19 NOTE — Progress Notes (Signed)
CODE SEPSIS - PHARMACY COMMUNICATION  **Broad Spectrum Antibiotics should be administered within 1 hour of Sepsis diagnosis**  Time Code Sepsis Called/Page Received: 9/27 @ 2331  Antibiotics Ordered: Ceftriaxone 2 gm   Time of 1st antibiotic administration: Ceftriaxone 2 gm IV X 1 on 9/28 @ 0013.   Additional action taken by pharmacy:   If necessary, Name of Provider/Nurse Contacted:     Novella Abraha D ,PharmD Clinical Pharmacist  07/19/2021  1:53 AM

## 2021-07-19 NOTE — Sepsis Progress Note (Signed)
Following for sepsis monitoring ?

## 2021-07-19 NOTE — ED Notes (Signed)
Patient reports that she "needs my lasix."  States she takes it at home and now feels like she "has too much fluid."  MD made aware.  Patient repositioned in bed for comfort.

## 2021-07-19 NOTE — Progress Notes (Signed)
PROGRESS NOTE    Betty Luna   BTD:[id number]  DOB: 1951-07-03  DOA: 07/18/2021 PCP: Ethelda Chick, MD   Brief Narrative:  Betty Luna a 70 y.o. African-American female with medical history significant for CHF, depression, type 2 diabetes mellitus, GERD, gout and hypertension, presented to the emergency room with acute onset of dysuria with associated lower abdominal suprapubic abdominal pain radiating to her back as well as nausea and vomiting with associated diarrhea.  She admitted to bilateral low back pain and malaise at home as well as tactile fever and chills.  She has a history of nephrolithiasis in the past.  No headache or dizziness or blurred vision.  No chest pain or dyspnea or palpitations.  Her cough is better and she denies wheezing.  The patient was seen in the ER on 07/12/2021 with URI symptoms and was diagnosed with COVID-19.  She finished a course of 5 days of Paxlovid .  She stated that she has been vaccinated and boosted for COVID-19.   Subjective: Patient feels tired.  She has been having burning micturition and some suprapubic pain over the past couple of days.    Assessment & Plan:   Principal Problem: Severe sepsis due to gram-negative UTI (HCC) -Found to have tachypnea tachycardia and leukopenia -With symptoms of dysuria and nausea and vomiting -Lactic acid level 5.6 initially-is steadily improving - Procalcitonin 17.47 - Follow-up on blood and urine cultures and continue antibiotics New normal saline  Active Problems:   Type 2 diabetes mellitus without complication, with long-term current use of insulin (HCC) -Metformin currently on hold -Hemoglobin A1c 7.2  Gout - Continue colchicine daily for prevention  Hypertension - Continue amlodipine, carvedilol and losartan    Time spent in minutes: 35 DVT prophylaxis:   Lovenox  Code Status: Full code Family Communication:  Level of Care: Level of care: Progressive Cardiac Disposition  Plan:  Status is: Inpatient  Remains inpatient appropriate because:IV treatments appropriate due to intensity of illness or inability to take PO  Dispo: The patient is from: Home              Anticipated d/c is to: Home              Patient currently is not medically stable to d/c.   Difficult to place patient No      Consultants:  None Procedures:  None  Antimicrobials:  Anti-infectives (From admission, onward)    Start     Dose/Rate Route Frequency Ordered Stop   07/19/21 0200  cefTRIAXone (ROCEPHIN) 2 g in sodium chloride 0.9 % 100 mL IVPB  Status:  Discontinued        2 g 200 mL/hr over 30 Minutes Intravenous Every 24 hours 07/19/21 0150 07/19/21 0159   07/18/21 2345  cefTRIAXone (ROCEPHIN) 2 g in sodium chloride 0.9 % 100 mL IVPB        2 g 200 mL/hr over 30 Minutes Intravenous Every 24 hours 07/18/21 2331 07/25/21 2344        Objective: Vitals:   07/19/21 1200 07/19/21 1230 07/19/21 1300 07/19/21 1330  BP: 119/76 128/73 120/72 100/69  Pulse: 87 84 83   Resp: 15 20 (!) 23 20  Temp:      TempSrc:      SpO2: 92% 94% 93% 96%  Weight:      Height:        Intake/Output Summary (Last 24 hours) at 07/19/2021 1437 Last data filed at 07/19/2021 0428 Gross per 24 hour  Intake 3000 ml  Output --  Net 3000 ml   Filed Weights   07/18/21 2030 07/19/21 0736  Weight: 96.6 kg 98.7 kg    Examination: General exam: Appears comfortable  HEENT: PERRLA, oral mucosa moist, no sclera icterus or thrush Respiratory system: Clear to auscultation. Respiratory effort normal. Cardiovascular system: S1 & S2 heard, RRR.   Gastrointestinal system: Abdomen soft, non-tender, nondistended. Normal bowel sounds. Central nervous system: Alert and oriented. No focal neurological deficits. Extremities: No cyanosis, clubbing or edema Skin: No rashes or ulcers Psychiatry:  Mood & affect appropriate.     Data Reviewed: I have personally reviewed following labs and imaging  studies  CBC: Recent Labs  Lab 07/18/21 2032 07/19/21 0542  WBC 3.3* 9.4  NEUTROABS 3.1  --   HGB 12.3 11.6*  HCT 37.0 35.1*  MCV 75.4* 76.1*  PLT 188 171   Basic Metabolic Panel: Recent Labs  Lab 07/18/21 2032 07/19/21 0542  NA 138 139  K 3.1* 3.8  CL 100 98  CO2 25 27  GLUCOSE 269* 184*  BUN 12 12  CREATININE 0.92 0.73  CALCIUM 9.0 9.0  MG  --  0.8*   GFR: Estimated Creatinine Clearance: 71.5 mL/min (by C-G formula based on SCr of 0.73 mg/dL). Liver Function Tests: Recent Labs  Lab 07/18/21 2032  AST 55*  ALT 47*  ALKPHOS 118  BILITOT 1.3*  PROT 6.9  ALBUMIN 3.4*   Recent Labs  Lab 07/18/21 2032  LIPASE 23   No results for input(s): AMMONIA in the last 168 hours. Coagulation Profile: Recent Labs  Lab 07/19/21 0542  INR 1.2   Cardiac Enzymes: No results for input(s): CKTOTAL, CKMB, CKMBINDEX, TROPONINI in the last 168 hours. BNP (last 3 results) No results for input(s): PROBNP in the last 8760 hours. HbA1C: No results for input(s): HGBA1C in the last 72 hours. CBG: No results for input(s): GLUCAP in the last 168 hours. Lipid Profile: No results for input(s): CHOL, HDL, LDLCALC, TRIG, CHOLHDL, LDLDIRECT in the last 72 hours. Thyroid Function Tests: No results for input(s): TSH, T4TOTAL, FREET4, T3FREE, THYROIDAB in the last 72 hours. Anemia Panel: No results for input(s): VITAMINB12, FOLATE, FERRITIN, TIBC, IRON, RETICCTPCT in the last 72 hours. Urine analysis:    Component Value Date/Time   COLORURINE YELLOW (A) 07/18/2021 2026   APPEARANCEUR CLOUDY (A) 07/18/2021 2026   APPEARANCEUR Hazy 09/18/2013 0828   LABSPEC 1.012 07/18/2021 2026   LABSPEC 1.013 09/18/2013 0828   PHURINE 5.0 07/18/2021 2026   GLUCOSEU 50 (A) 07/18/2021 2026   GLUCOSEU Negative 09/18/2013 0828   HGBUR SMALL (A) 07/18/2021 2026   BILIRUBINUR NEGATIVE 07/18/2021 2026   BILIRUBINUR Negative 09/18/2013 0828   KETONESUR NEGATIVE 07/18/2021 2026   PROTEINUR 100 (A)  07/18/2021 2026   NITRITE NEGATIVE 07/18/2021 2026   LEUKOCYTESUR LARGE (A) 07/18/2021 2026   LEUKOCYTESUR 1+ 09/18/2013 0828   Sepsis Labs: @LABRCNTIP (procalcitonin:4,lacticidven:4) ) Recent Results (from the past 240 hour(s))  Resp Panel by RT-PCR (Flu A&B, Covid) Nasopharyngeal Swab     Status: Abnormal   Collection Time: 07/11/21  7:19 PM   Specimen: Nasopharyngeal Swab; Nasopharyngeal(NP) swabs in vial transport medium  Result Value Ref Range Status   SARS Coronavirus 2 by RT PCR POSITIVE (A) NEGATIVE Final    Comment: RESULT CALLED TO, READ BACK BY AND VERIFIED WITH: RAQUEL DAVID RN 2117 07/11/21 HNM (NOTE) SARS-CoV-2 target nucleic acids are DETECTED.  The SARS-CoV-2 RNA is generally detectable in upper respiratory specimens during the acute  phase of infection. Positive results are indicative of the presence of the identified virus, but do not rule out bacterial infection or co-infection with other pathogens not detected by the test. Clinical correlation with patient history and other diagnostic information is necessary to determine patient infection status. The expected result is Negative.  Fact Sheet for Patients: BloggerCourse.com  Fact Sheet for Healthcare Providers: SeriousBroker.it  This test is not yet approved or cleared by the [place] FDA and  has been authorized for detection and/or diagnosis of SARS-CoV-2 by FDA under an Emergency Use Authorization (EUA).  This EUA will remain in effect (meaning this test can be  used) for the duration of  the COVID-19 declaration under Section 564(b)(1) of the Act, 21 U.S.C. section 360bbb-3(b)(1), unless the authorization is terminated or revoked sooner.     Influenza A by PCR NEGATIVE NEGATIVE Final   Influenza B by PCR NEGATIVE NEGATIVE Final    Comment: (NOTE) The Xpert Xpress SARS-CoV-2/FLU/RSV plus assay is intended as an aid in the diagnosis of influenza from  Nasopharyngeal swab specimens and should not be used as a sole basis for treatment. Nasal washings and aspirates are unacceptable for Xpert Xpress SARS-CoV-2/FLU/RSV testing.  Fact Sheet for Patients: BloggerCourse.com  Fact Sheet for Healthcare Providers: SeriousBroker.it  This test is not yet approved or cleared by the [place] FDA and has been authorized for detection and/or diagnosis of SARS-CoV-2 by FDA under an Emergency Use Authorization (EUA). This EUA will remain in effect (meaning this test can be used) for the duration of the COVID-19 declaration under Section 564(b)(1) of the Act, 21 U.S.C. section 360bbb-3(b)(1), unless the authorization is terminated or revoked.  Performed at [facility], [address] [place], [place] [address]   Culture, blood (Routine X 2) w Reflex to ID Panel     Status: None (Preliminary result)   Collection Time: 07/18/21 11:50 PM   Specimen: BLOOD  Result Value Ref Range Status   Specimen Description BLOOD RIGHT FOREARM  Final   Special Requests   Final    BOTTLES DRAWN AEROBIC AND ANAEROBIC Blood Culture adequate volume   Culture   Final    NO GROWTH < 12 HOURS Performed at [facility], [address], [place], [place] [address]    Report Status PENDING  Incomplete  Culture, blood (Routine X 2) w Reflex to ID Panel     Status: None (Preliminary result)   Collection Time: 07/19/21 12:04 AM   Specimen: BLOOD  Result Value Ref Range Status   Specimen Description BLOOD LEFT ASSIST CONTROL  Final   Special Requests   Final    BOTTLES DRAWN AEROBIC AND ANAEROBIC Blood Culture adequate volume   Culture   Final    NO GROWTH < 12 HOURS Performed at [facility], [address] [place], [place] [address]    Report Status PENDING  Incomplete         Radiology Studies: DG Chest Portable 1 View  Result Date: 07/19/2021 CLINICAL DATA:  Sepsis,  COVID-19 infection EXAM: PORTABLE CHEST 1 VIEW COMPARISON:  None. FINDINGS: Pulmonary insufflation is symmetric. Stable bilateral perihilar interstitial thickening likely reflecting scarring better seen on CT examination of 03/28/2015. No superimposed confluent pulmonary infiltrate. No pneumothorax or pleural effusion. Cardiac size within normal limits. Osseous structures are age-appropriate. IMPRESSION: No active disease. Electronically Signed   By: Helyn Numbers M.D.   On: 07/19/2021 01:34   CT Renal Stone Study  Result Date: 07/18/2021 CLINICAL  DATA:  70 year old female with history of flank pain. Evaluate for kidney stone. EXAM: CT ABDOMEN AND PELVIS WITHOUT CONTRAST TECHNIQUE: Multidetector CT imaging of the abdomen and pelvis was performed following the standard protocol without IV contrast. COMPARISON:  CT of the abdomen and pelvis 04/03/2021. FINDINGS: Lower chest: Mild scarring in the lung bases bilaterally. Atherosclerotic calcifications in the thoracic aorta as well as the left anterior descending, left circumflex and right coronary arteries. Calcifications of the aortic valve and mitral annulus. Hepatobiliary: Mild diffuse low attenuation throughout the hepatic parenchyma, indicative of hepatic steatosis. No discrete cystic or solid hepatic lesions are confidently identified on today's noncontrast CT examination. Unenhanced appearance of the gallbladder is unremarkable. Pancreas: No definite pancreatic mass or peripancreatic fluid collections or inflammatory changes are noted on today's noncontrast CT examination. Spleen: Unremarkable. Adrenals/Urinary Tract: Nonobstructive calculus in the lower pole collecting system of the left kidney measuring 6 mm. No additional calculi are noted within the right renal collecting system, along the course of either ureter or within the lumen of the urinary bladder. No hydroureteronephrosis. Unenhanced appearance of the kidneys, bilateral adrenal glands and  urinary bladder is otherwise unremarkable. Stomach/Bowel: Unenhanced appearance of the stomach is normal. No pathologic dilatation of small bowel or colon. A few scattered colonic diverticulae are noted, without surrounding inflammatory changes to suggest an acute diverticulitis at this time. Normal appendix. Vascular/Lymphatic: Aortic atherosclerosis. No lymphadenopathy noted in the abdomen or pelvis. Reproductive: Status post hysterectomy. Ovaries are not confidently identified may be surgically absent or atrophic. Other: No significant volume of ascites.  No pneumoperitoneum. Musculoskeletal: There are no aggressive appearing lytic or blastic lesions noted in the visualized portions of the skeleton. IMPRESSION: 1. 6 mm nonobstructive calculus in the lower pole collecting system of left kidney. No ureteral stones or findings of urinary tract obstruction are noted at this time. 2. Hepatic steatosis. 3. Colonic diverticulosis without evidence of acute diverticulitis at this time. 4. Aortic atherosclerosis, in addition to at least 3 vessel coronary artery disease. Please note that although the presence of coronary artery calcium documents the presence of coronary artery disease, the severity of this disease and any potential stenosis cannot be assessed on this non-gated CT examination. Assessment for potential risk factor modification, dietary therapy or pharmacologic therapy may be warranted, if clinically indicated. 5. There are calcifications of the aortic valve and mitral annulus. Echocardiographic correlation for evaluation of potential valvular dysfunction may be warranted if clinically indicated. Electronically Signed   By: Trudie Reed M.D.   On: 07/18/2021 20:43      Scheduled Meds:  amLODipine  10 mg Oral Daily   aspirin EC  81 mg Oral Daily   carvedilol  25 mg Oral BID   enoxaparin (LOVENOX) injection  0.5 mg/kg Subcutaneous Q24H   escitalopram  10 mg Oral Daily   losartan  100 mg Oral Daily    meloxicam  7.5 mg Oral Daily   pantoprazole  40 mg Oral Daily   torsemide  20 mg Oral Daily   Vitamin D (Ergocalciferol)  50,000 Units Oral Weekly   Continuous Infusions:  0.9 % NaCl with KCl 20 mEq / L 100 mL/hr at 07/19/21 0741   cefTRIAXone (ROCEPHIN)  IV Stopped (07/19/21 0048)   lactated ringers       LOS: 0 days      Calvert Cantor, MD [facility] Pager: [email or web address] 07/19/2021, 2:37 PM

## 2021-07-19 NOTE — H&P (Signed)
[place]   PATIENT NAME: Betty Luna    MR#:  [id number]  DATE OF BIRTH:  August 04, 1951  DATE OF ADMISSION:  07/18/2021  PRIMARY CARE PHYSICIAN: Ethelda Chick, MD   Patient is coming from: Home  REQUESTING/REFERRING PHYSICIAN: Ward, Baxter Hire, DO  CHIEF COMPLAINT:   Chief Complaint  Patient presents with  . Abdominal Pain    HISTORY OF PRESENT ILLNESS:  Betty Luna is a 70 y.o. African-American female with medical history significant for CHF, depression, type 2 diabetes mellitus, GERD, gout and hypertension, presented to the emergency room with acute onset of dysuria with associated lower abdominal suprapubic abdominal pain radiating to her back as well as nausea and vomiting with associated diarrhea.  She admitted to bilateral low back pain and malaise at home as well as tactile fever and chills.  She has a history of nephrolithiasis in the past.  No headache or dizziness or blurred vision.  No chest pain or dyspnea or palpitations.  Her cough is better and she denies wheezing.  The patient was seen in the ER on 07/12/2021 with URI symptoms and was diagnosed with COVID-19.  She finished a course of 5 days of Paxlovid .  She stated that she has been vaccinated and boosted for COVID-19.  ED Course: When she came to the ER blood pressure was 147/66 with respiratory rate of 20 and later 21 that 24.  Labs revealed hypokalemia and hyperglycemia of 269 with AST of 55 and ALT 47 with albumin of 3.4 and total protein 6.9 and total bili 1.3.  Lactic acid was 5.6 initially and later on 4.6.  CBC showed leukopenia of 3.3.  Blood cultures were drawn.  EKG as reviewed by me : EKG showed sinus rhythm with a rate of 97/colonic Imaging: Portable chest ray showed no acute cardiopulmonary disease. Renal stone CT showed the following: 1. 6 mm nonobstructive calculus in the lower pole collecting system of left kidney. No ureteral stones or findings of urinary tract obstruction are noted  at this time. 2. Hepatic steatosis. 3. Colonic diverticulosis without evidence of acute diverticulitis at this time. 4. Aortic atherosclerosis, in addition to at least 3 vessel coronary artery disease. 5. There are calcifications of the aortic valve and mitral annulus.  The patient was given 2 g of IV Rocephin and 3 L bolus of IV lactated Ringer followed 150 mill per hour and p.o. Tylenol.  She will be admitted to a medical monitored PAST MEDICAL HISTORY:   Past Medical History:  Diagnosis Date  . Anemia   . Arthritis   . CHF (congestive heart failure) (HCC)   . Depression   . Diabetes mellitus without complication (HCC)   . GERD (gastroesophageal reflux disease)   . Gout   . Hypertension     PAST SURGICAL HISTORY:   Past Surgical History:  Procedure Laterality Date  . ABDOMINAL HYSTERECTOMY    . CATARACT EXTRACTION W/PHACO Right 04/16/2017   Procedure: CATARACT EXTRACTION PHACO AND INTRAOCULAR LENS PLACEMENT (IOC);  Surgeon: Galen Manila, MD;  Location: [facility] ORS;  Service: Ophthalmology;  Laterality: Right;  [place] 01:06 AP% 16.5 CDE 11.03 Fluid pack lot # [id number]  . CATARACT EXTRACTION W/PHACO Left 10/01/2017   Procedure: CATARACT EXTRACTION PHACO AND INTRAOCULAR LENS PLACEMENT (IOC)-LEFT DIABETIC;  Surgeon: Galen Manila, MD;  Location: [facility] ORS;  Service: Ophthalmology;  Laterality: Left;  [place] 01:05.7 AP% 12.2 CDE 7.42 Fluid pack lot # [id number]  . CTR Left   . TONSILLECTOMY  SOCIAL HISTORY:   Social History   Tobacco Use  . Smoking status: Former  . Smokeless tobacco: Current    Types: Snuff  Substance Use Topics  . Alcohol use: Yes    Comment: occasonally    FAMILY HISTORY:   Family History  Problem Relation Age of Onset  . Alcohol abuse Brother   . Breast cancer Sister     DRUG ALLERGIES:   Allergies  Allergen Reactions  . Omeprazole Other (See Comments)    CHEST PAIN   . Tramadol Nausea Only and Nausea And Vomiting  . Atorvastatin  Other (See Comments)    And another statin too  . Codeine Other (See Comments) and Anxiety    Reaction: jittery  Jittery    REVIEW OF SYSTEMS:   ROS As per history of present illness. All pertinent systems were reviewed above. Constitutional, HEENT, cardiovascular, respiratory, GI, GU, musculoskeletal, neuro, psychiatric, endocrine, integumentary and hematologic systems were reviewed and are otherwise negative/unremarkable except for positive findings mentioned above in the HPI.   MEDICATIONS AT HOME:   Prior to Admission medications   Medication Sig Start Date End Date Taking? Authorizing Provider  amLODipine (NORVASC) 10 MG tablet Take 10 mg by mouth daily. 03/24/21  Yes [provider]  aspirin EC 81 MG tablet Take 81 mg by mouth daily.   Yes [provider]  benzonatate (TESSALON PERLES) 100 MG capsule Take 1 capsule (100 mg total) by mouth 3 (three) times daily as needed for cough. 07/12/21 07/12/22 Yes Ward, Kristen N, DO  carvedilol (COREG) 25 MG tablet Take 25 mg by mouth 2 (two) times daily.  02/14/15  Yes [provider]  colchicine 0.6 MG tablet Take 0.6 mg by mouth daily as needed.   Yes [provider]  ergocalciferol (VITAMIN D2) 1.25 MG (50000 UT) capsule ergocalciferol (vitamin D2) 1,250 mcg (50,000 unit) capsule  TAKE 1 CAPSULE BY MOUTH WEEKLY FOR 8 WEEKS   Yes [provider]  escitalopram (LEXAPRO) 20 MG tablet Take 10 mg by mouth daily. 02/09/21  Yes [provider]  losartan (COZAAR) 100 MG tablet Take 100 mg by mouth daily. 01/13/21  Yes [provider]  meloxicam (MOBIC) 7.5 MG tablet Take 7.5 mg by mouth daily. 06/16/21  Yes [provider]  metFORMIN (GLUCOPHAGE) 500 MG tablet Take 2 tablets by mouth 2 (two) times daily. 02/09/21  Yes [provider]  pantoprazole (PROTONIX) 40 MG tablet Take 40 mg by mouth daily. 03/07/17  Yes [provider]  torsemide (DEMADEX) 20 MG tablet Take 20  mg by mouth daily. 06/07/21  Yes [provider]      VITAL SIGNS:  Blood pressure 136/77, pulse 93, temperature 99.7 F (37.6 C), temperature source Oral, resp. rate 14, height 5\' 1"  (1.549 m), weight 96.6 kg, SpO2 95 %.  PHYSICAL EXAMINATION:  Physical Exam  GENERAL:  70 y.o.-year-old African-American female patient lying in the bed with no acute distress.  EYES: Pupils equal, round, reactive to light and accommodation. No scleral icterus. Extraocular muscles intact.  HEENT: Head atraumatic, normocephalic. Oropharynx and nasopharynx clear.  NECK:  Supple, no jugular venous distention. No thyroid enlargement, no tenderness.  LUNGS: Normal breath sounds bilaterally, no wheezing, rales,rhonchi or crepitation. No use of accessory muscles of respiration.  CARDIOVASCULAR: Regular rate and rhythm, S1, S2 normal. No murmurs, rubs, or gallops.  ABDOMEN: Soft, nondistended, nontender. Bowel sounds present. No organomegaly or mass.  EXTREMITIES: No pedal edema, cyanosis, or clubbing.  NEUROLOGIC:  Cranial nerves II through XII are intact. Muscle strength 5/5 in all extremities. Sensation intact. Gait not checked.  PSYCHIATRIC: The patient is alert and oriented x 3.  Normal affect and good eye contact. SKIN: No obvious rash, lesion, or ulcer.   LABORATORY PANEL:   CBC Recent Labs  Lab 07/18/21 2032  WBC 3.3*  HGB 12.3  HCT 37.0  PLT 188   ------------------------------------------------------------------------------------------------------------------  Chemistries  Recent Labs  Lab 07/18/21 2032  NA 138  K 3.1*  CL 100  CO2 25  GLUCOSE 269*  BUN 12  CREATININE 0.92  CALCIUM 9.0  AST 55*  ALT 47*  ALKPHOS 118  BILITOT 1.3*   ------------------------------------------------------------------------------------------------------------------  Cardiac Enzymes No results for input(s): TROPONINI in the last 168  hours. ------------------------------------------------------------------------------------------------------------------  RADIOLOGY:  DG Chest Portable 1 View  Result Date: 07/19/2021 CLINICAL DATA:  Sepsis, COVID-19 infection EXAM: PORTABLE CHEST 1 VIEW COMPARISON:  None. FINDINGS: Pulmonary insufflation is symmetric. Stable bilateral perihilar interstitial thickening likely reflecting scarring better seen on CT examination of 03/28/2015. No superimposed confluent pulmonary infiltrate. No pneumothorax or pleural effusion. Cardiac size within normal limits. Osseous structures are age-appropriate. IMPRESSION: No active disease. Electronically Signed   By: Helyn Numbers M.D.   On: 07/19/2021 01:34   CT Renal Stone Study  Result Date: 07/18/2021 CLINICAL DATA:  70 year old female with history of flank pain. Evaluate for kidney stone. EXAM: CT ABDOMEN AND PELVIS WITHOUT CONTRAST TECHNIQUE: Multidetector CT imaging of the abdomen and pelvis was performed following the standard protocol without IV contrast. COMPARISON:  CT of the abdomen and pelvis 04/03/2021. FINDINGS: Lower chest: Mild scarring in the lung bases bilaterally. Atherosclerotic calcifications in the thoracic aorta as well as the left anterior descending, left circumflex and right coronary arteries. Calcifications of the aortic valve and mitral annulus. Hepatobiliary: Mild diffuse low attenuation throughout the hepatic parenchyma, indicative of hepatic steatosis. No discrete cystic or solid hepatic lesions are confidently identified on today's noncontrast CT examination. Unenhanced appearance of the gallbladder is unremarkable. Pancreas: No definite pancreatic mass or peripancreatic fluid collections or inflammatory changes are noted on today's noncontrast CT examination. Spleen: Unremarkable. Adrenals/Urinary Tract: Nonobstructive calculus in the lower pole collecting system of the left kidney measuring 6 mm. No additional calculi are noted  within the right renal collecting system, along the course of either ureter or within the lumen of the urinary bladder. No hydroureteronephrosis. Unenhanced appearance of the kidneys, bilateral adrenal glands and urinary bladder is otherwise unremarkable. Stomach/Bowel: Unenhanced appearance of the stomach is normal. No pathologic dilatation of small bowel or colon. A few scattered colonic diverticulae are noted, without surrounding inflammatory changes to suggest an acute diverticulitis at this time. Normal appendix. Vascular/Lymphatic: Aortic atherosclerosis. No lymphadenopathy noted in the abdomen or pelvis. Reproductive: Status post hysterectomy. Ovaries are not confidently identified may be surgically absent or atrophic. Other: No significant volume of ascites.  No pneumoperitoneum. Musculoskeletal: There are no aggressive appearing lytic or blastic lesions noted in the visualized portions of the skeleton. IMPRESSION: 1. 6 mm nonobstructive calculus in the lower pole collecting system of left kidney. No ureteral stones or findings of urinary tract obstruction are noted at this time. 2. Hepatic steatosis. 3. Colonic diverticulosis without evidence of acute diverticulitis at this time. 4. Aortic atherosclerosis, in addition to at least 3 vessel coronary artery disease. Please note that although the presence of coronary artery calcium documents the presence of coronary artery disease, the severity of this disease and any potential stenosis cannot be  assessed on this non-gated CT examination. Assessment for potential risk factor modification, dietary therapy or pharmacologic therapy may be warranted, if clinically indicated. 5. There are calcifications of the aortic valve and mitral annulus. Echocardiographic correlation for evaluation of potential valvular dysfunction may be warranted if clinically indicated. Electronically Signed   By: Trudie Reed M.D.   On: 07/18/2021 20:43      IMPRESSION AND PLAN:   Active Problems:   Sepsis due to gram-negative UTI (HCC)  1.  Sepsis secondary to UTI.  Sepsis manifested by leukopenia and tachypnea.  The patient meets criteria for severe sepsis given significant elevated lactic acid of 5.6. - The patient will be admitted to a PCU bed. - We will continue antibiotic therapy with IV Rocephin. - We will follow blood and urine cultures. - We will follow lactic acid level that is coming down currently.  2.  Hypokalemia. -Potassium will be replaced and magnesium level will be checked.  3.  Essential hypertension. - We will continue Coreg, Cozaar and amlodipine.  4.  Gout. - We will continue colchicine.  5.  Type 2 diabetes mellitus. - The patient will be placed on supplement coverage with NovoLog. - We will hold off her metformin.  6.  GERD. - We will continue PPI therapy.   DVT prophylaxis: Lovenox. Code Status: full code. Family Communication:  The plan of care was discussed in details with the patient (and family). I answered all questions. The patient agreed to proceed with the above mentioned plan. Further management will depend upon hospital course. Disposition Plan: Back to previous home environment Consults called: none. All the records are reviewed and case discussed with ED provider.  Status is: Inpatient  Remains inpatient appropriate because:Ongoing diagnostic testing needed not appropriate for outpatient work up, Unsafe d/c plan, IV treatments appropriate due to intensity of illness or inability to take PO, and Inpatient level of care appropriate due to severity of illness  Dispo: The patient is from: Home              Anticipated d/c is to: Home              Patient currently is not medically stable to d/c.   Difficult to place patient No   TOTAL TIME TAKING CARE OF THIS PATIENT: 55 minutes.    Hannah Beat M.D on 07/19/2021 at 1:57 AM  [facility]   From 7 PM-7 AM, contact night-coverage [email or web address]  CC:  Primary care physician; Ethelda Chick, MD

## 2021-07-20 DIAGNOSIS — R7881 Bacteremia: Secondary | ICD-10-CM

## 2021-07-20 LAB — URINE CULTURE: Culture: >=100000 — AB

## 2021-07-20 LAB — BLOOD CULTURE ID PANEL (REFLEXED) - BCID2

## 2021-07-20 LAB — COMPREHENSIVE METABOLIC PANEL
ALT: 30 U/L (ref 0–44)
AST: 27 U/L (ref 15–41)
Albumin: 2.8 g/dL — ABNORMAL LOW (ref 3.5–5.0)
Alkaline Phosphatase: 69 U/L (ref 38–126)
Anion gap: 9 (ref 5–15)
BUN: 15 mg/dL (ref 8–23)
CO2: 28 mmol/L (ref 22–32)
Calcium: 8.7 mg/dL — ABNORMAL LOW (ref 8.9–10.3)
Chloride: 100 mmol/L (ref 98–111)
Creatinine, Ser: 0.79 mg/dL (ref 0.44–1.00)
GFR, Estimated: >60 mL/min (ref >60)
Glucose, Bld: 247 mg/dL — ABNORMAL HIGH (ref 70–99)
Potassium: 3.3 mmol/L — ABNORMAL LOW (ref 3.5–5.1)
Sodium: 137 mmol/L (ref 135–145)
Total Bilirubin: 0.7 mg/dL (ref 0.3–1.2)
Total Protein: 6.2 g/dL — ABNORMAL LOW (ref 6.5–8.1)

## 2021-07-20 LAB — GLUCOSE, CAPILLARY
Glucose-Capillary: 229 mg/dL — ABNORMAL HIGH (ref 70–99)
Glucose-Capillary: 140 mg/dL — ABNORMAL HIGH (ref 70–99)
Glucose-Capillary: 221 mg/dL — ABNORMAL HIGH (ref 70–99)

## 2021-07-20 LAB — IRON AND TIBC
Iron: 19 ug/dL — ABNORMAL LOW (ref 28–170)
Saturation Ratios: 7 % — ABNORMAL LOW (ref 10.4–31.8)
TIBC: 273 ug/dL (ref 250–450)
UIBC: 254 ug/dL

## 2021-07-20 LAB — MAGNESIUM: Magnesium: 2 mg/dL (ref 1.7–2.4)

## 2021-07-20 LAB — RETICULOCYTES
Immature Retic Fract: 12.5 % (ref 2.3–15.9)
RBC.: 4.49 MIL/uL (ref 3.87–5.11)
Retic Count, Absolute: 52.1 10*3/uL (ref 19.0–186.0)
Retic Ct Pct: 1.2 % (ref 0.4–3.1)

## 2021-07-20 LAB — FOLATE: Folate: 9.8 ng/mL (ref >5.9)

## 2021-07-20 LAB — FERRITIN: Ferritin: 87 ng/mL (ref 11–307)

## 2021-07-20 MED ORDER — INSULIN ASPART 100 UNIT/ML IJ SOLN
0.0000 [IU] | Freq: Three times a day (TID) | INTRAMUSCULAR | Status: DC
  Administered 2021-07-20: 18:00:00 2 [IU] via SUBCUTANEOUS
  Administered 2021-07-20 – 2021-07-21 (×2): 5 [IU] via SUBCUTANEOUS
  Filled 2021-07-20 (×3): qty 1

## 2021-07-20 MED ORDER — CEFAZOLIN SODIUM-DEXTROSE 2-4 GM/100ML-% IV SOLN
2.0000 g | Freq: Three times a day (TID) | INTRAVENOUS | Status: DC
  Administered 2021-07-20 – 2021-07-21 (×3): 2 g via INTRAVENOUS
  Filled 2021-07-20 (×5): qty 100

## 2021-07-20 MED ORDER — COLCHICINE 0.6 MG PO TABS
0.6000 mg | ORAL_TABLET | Freq: Every day | ORAL | Status: DC
  Administered 2021-07-20 – 2021-07-21 (×2): 0.6 mg via ORAL
  Filled 2021-07-20 (×2): qty 1

## 2021-07-20 MED ORDER — MAGNESIUM SULFATE 4 GM/100ML IV SOLN
4.0000 g | Freq: Once | INTRAVENOUS | Status: AC
  Administered 2021-07-20: 4 g via INTRAVENOUS
  Filled 2021-07-20: qty 100

## 2021-07-20 NOTE — Progress Notes (Signed)
PHARMACY - PHYSICIAN COMMUNICATION CRITICAL VALUE ALERT - BLOOD CULTURE IDENTIFICATION (BCID)  Betty Luna is an 70 y.o. female who presented to [facility] on 07/18/2021 with a chief complaint of sepsis/UTI.    Assessment:  E coli in 1 of 4 bottles, no resistance detected.  (include suspected source if known)  Name of physician (or Provider) Contacted: Manuela Schwartz, NP   Current antibiotics: Ceftriaxone 2 gm IV Q24H   Changes to prescribed antibiotics recommended:  Patient is on recommended antibiotics - No changes needed  No results found for this or any previous visit.  Betty Luna 07/20/2021  2:36 AM

## 2021-07-20 NOTE — Progress Notes (Signed)
PROGRESS NOTE    Betty Luna   NLZ:[id number]  DOB: Jan 20, 1951  DOA: 07/18/2021 PCP: Ethelda Chick, MD   Brief Narrative:  Betty Luna a 70 y.o. African-American female with medical history significant for CHF, depression, type 2 diabetes mellitus, GERD, gout and hypertension, presented to the emergency room with acute onset of dysuria with associated lower abdominal suprapubic abdominal pain radiating to her back as well as nausea and vomiting with associated diarrhea.  She admitted to bilateral low back pain and malaise at home as well as tactile fever and chills.  She has a history of nephrolithiasis in the past.  No headache or dizziness or blurred vision.  No chest pain or dyspnea or palpitations.  Her cough is better and she denies wheezing.  The patient was seen in the ER on 07/12/2021 with URI symptoms and was diagnosed with COVID-19.  She finished a course of 5 days of Paxlovid .  She stated that she has been vaccinated and boosted for COVID-19.   Subjective: The patient has no complaints today    Assessment & Plan:   Principal Problem: Severe sepsis due to gram-negative UTI (HCC) Gram-negative bacteremia -Found to have tachypnea tachycardia and leukopenia -With symptoms of dysuria and nausea and vomiting -Lactic acid level 5.6 initially-is steadily improving - Procalcitonin 17.47 - Urine culture has grown greater than 100,000 colonies of E. coli which is sensitive to cefazolin - Blood culture has grown gram-negative rods in 1 set and on BCID this is E. coli New normal saline  Active Problems:   Type 2 diabetes mellitus without complication, with long-term current use of insulin (HCC) -Metformin currently on hold -Hemoglobin A1c 7.2 -Sliding scale insulin ordered  Hypomagnesemia - Magnesium level 0.8 is being replaced  Elevated LFTs - AST 55, ALT 47, T bili 1.3 - She has had normal LFTs in the past-elevation possibly secondary to underlying  infection/sepsis - Will recheck tomorrow  Microcytic anemia - Check anemia panel  Gout - Continue colchicine daily for prevention  Hypertension - Continue amlodipine, carvedilol and losartan    Time spent in minutes: 35 DVT prophylaxis:   Lovenox  Code Status: Full code Family Communication:  Level of Care: Level of care: Med-Surg Disposition Plan:  Status is: Inpatient  Remains inpatient appropriate because:IV treatments appropriate due to intensity of illness or inability to take PO  Dispo: The patient is from: Home              Anticipated d/c is to: Home              Patient currently is not medically stable to d/c.   Difficult to place patient No      Consultants:  None Procedures:  None  Antimicrobials:  Anti-infectives (From admission, onward)    Start     Dose/Rate Route Frequency Ordered Stop   07/19/21 0200  cefTRIAXone (ROCEPHIN) 2 g in sodium chloride 0.9 % 100 mL IVPB  Status:  Discontinued        2 g 200 mL/hr over 30 Minutes Intravenous Every 24 hours 07/19/21 0150 07/19/21 0159   07/18/21 2345  cefTRIAXone (ROCEPHIN) 2 g in sodium chloride 0.9 % 100 mL IVPB        2 g 200 mL/hr over 30 Minutes Intravenous Every 24 hours 07/18/21 2331 07/25/21 2159        Objective: Vitals:   07/19/21 2114 07/19/21 2328 07/20/21 0409 07/20/21 0833  BP: 125/69 134/81 134/80 135/73  Pulse: 90 90 88  90  Resp:  15 17 18   Temp:  98.2 F (36.8 C) 98.3 F (36.8 C) 98.3 F (36.8 C)  TempSrc:  Oral    SpO2: 100% 99% 94% 99%  Weight:  98.1 kg    Height:  5\' 1"  (1.549 m)      Intake/Output Summary (Last 24 hours) at 07/20/2021 1109 Last data filed at 07/19/2021 2322 Gross per 24 hour  Intake 100 ml  Output --  Net 100 ml    Filed Weights   07/18/21 2030 07/19/21 0736 07/19/21 2328  Weight: 96.6 kg 98.7 kg 98.1 kg    Examination: General exam: Appears comfortable  HEENT: PERRLA, oral mucosa moist, no sclera icterus or thrush Respiratory system:  Clear to auscultation. Respiratory effort normal. Cardiovascular system: S1 & S2 heard, regular rate and rhythm Gastrointestinal system: Abdomen soft, non-tender, nondistended. Normal bowel sounds   Central nervous system: Alert and oriented. No focal neurological deficits. Extremities: No cyanosis, clubbing or edema Skin: No rashes or ulcers Psychiatry:  Mood & affect appropriate.      Data Reviewed: I have personally reviewed following labs and imaging studies  CBC: Recent Labs  Lab 07/18/21 2032 07/19/21 0542  WBC 3.3* 9.4  NEUTROABS 3.1  --   HGB 12.3 11.6*  HCT 37.0 35.1*  MCV 75.4* 76.1*  PLT 188 171    Basic Metabolic Panel: Recent Labs  Lab 07/18/21 2032 07/19/21 0542  NA 138 139  K 3.1* 3.8  CL 100 98  CO2 25 27  GLUCOSE 269* 184*  BUN 12 12  CREATININE 0.92 0.73  CALCIUM 9.0 9.0  MG  --  0.8*    GFR: Estimated Creatinine Clearance: 71.1 mL/min (by C-G formula based on SCr of 0.73 mg/dL). Liver Function Tests: Recent Labs  Lab 07/18/21 2032  AST 55*  ALT 47*  ALKPHOS 118  BILITOT 1.3*  PROT 6.9  ALBUMIN 3.4*    Recent Labs  Lab 07/18/21 2032  LIPASE 23    No results for input(s): AMMONIA in the last 168 hours. Coagulation Profile: Recent Labs  Lab 07/19/21 0542  INR 1.2    Cardiac Enzymes: No results for input(s): CKTOTAL, CKMB, CKMBINDEX, TROPONINI in the last 168 hours. BNP (last 3 results) No results for input(s): PROBNP in the last 8760 hours. HbA1C: No results for input(s): HGBA1C in the last 72 hours. CBG: No results for input(s): GLUCAP in the last 168 hours. Lipid Profile: No results for input(s): CHOL, HDL, LDLCALC, TRIG, CHOLHDL, LDLDIRECT in the last 72 hours. Thyroid Function Tests: No results for input(s): TSH, T4TOTAL, FREET4, T3FREE, THYROIDAB in the last 72 hours. Anemia Panel: No results for input(s): VITAMINB12, FOLATE, FERRITIN, TIBC, IRON, RETICCTPCT in the last 72 hours. Urine analysis:    Component  Value Date/Time   COLORURINE YELLOW (A) 07/18/2021 2026   APPEARANCEUR CLOUDY (A) 07/18/2021 2026   APPEARANCEUR Hazy 09/18/2013 0828   LABSPEC 1.012 07/18/2021 2026   LABSPEC 1.013 09/18/2013 0828   PHURINE 5.0 07/18/2021 2026   GLUCOSEU 50 (A) 07/18/2021 2026   GLUCOSEU Negative 09/18/2013 0828   HGBUR SMALL (A) 07/18/2021 2026   BILIRUBINUR NEGATIVE 07/18/2021 2026   BILIRUBINUR Negative 09/18/2013 0828   KETONESUR NEGATIVE 07/18/2021 2026   PROTEINUR 100 (A) 07/18/2021 2026   NITRITE NEGATIVE 07/18/2021 2026   LEUKOCYTESUR LARGE (A) 07/18/2021 2026   LEUKOCYTESUR 1+ 09/18/2013 0828   Sepsis Labs: @LABRCNTIP (procalcitonin:4,lacticidven:4) ) Recent Results (from the past 240 hour(s))  Resp Panel by RT-PCR (Flu A&B,  Covid) Nasopharyngeal Swab     Status: Abnormal   Collection Time: 07/11/21  7:19 PM   Specimen: Nasopharyngeal Swab; Nasopharyngeal(NP) swabs in vial transport medium  Result Value Ref Range Status   SARS Coronavirus 2 by RT PCR POSITIVE (A) NEGATIVE Final    Comment: RESULT CALLED TO, READ BACK BY AND VERIFIED WITH: RAQUEL DAVID RN 2117 07/11/21 HNM (NOTE) SARS-CoV-2 target nucleic acids are DETECTED.  The SARS-CoV-2 RNA is generally detectable in upper respiratory specimens during the acute phase of infection. Positive results are indicative of the presence of the identified virus, but do not rule out bacterial infection or co-infection with other pathogens not detected by the test. Clinical correlation with patient history and other diagnostic information is necessary to determine patient infection status. The expected result is Negative.  Fact Sheet for Patients: BloggerCourse.com  Fact Sheet for Healthcare Providers: SeriousBroker.it  This test is not yet approved or cleared by the [place] FDA and  has been authorized for detection and/or diagnosis of SARS-CoV-2 by FDA under an Emergency Use  Authorization (EUA).  This EUA will remain in effect (meaning this test can be  used) for the duration of  the COVID-19 declaration under Section 564(b)(1) of the Act, 21 U.S.C. section 360bbb-3(b)(1), unless the authorization is terminated or revoked sooner.     Influenza A by PCR NEGATIVE NEGATIVE Final   Influenza B by PCR NEGATIVE NEGATIVE Final    Comment: (NOTE) The Xpert Xpress SARS-CoV-2/FLU/RSV plus assay is intended as an aid in the diagnosis of influenza from Nasopharyngeal swab specimens and should not be used as a sole basis for treatment. Nasal washings and aspirates are unacceptable for Xpert Xpress SARS-CoV-2/FLU/RSV testing.  Fact Sheet for Patients: BloggerCourse.com  Fact Sheet for Healthcare Providers: SeriousBroker.it  This test is not yet approved or cleared by the [place] FDA and has been authorized for detection and/or diagnosis of SARS-CoV-2 by FDA under an Emergency Use Authorization (EUA). This EUA will remain in effect (meaning this test can be used) for the duration of the COVID-19 declaration under Section 564(b)(1) of the Act, 21 U.S.C. section 360bbb-3(b)(1), unless the authorization is terminated or revoked.  Performed at [facility], [address] [place], [place] [address]   Urine Culture     Status: Abnormal   Collection Time: 07/18/21  8:26 PM   Specimen: Urine, Random  Result Value Ref Range Status   Specimen Description   Final    URINE, RANDOM Performed at [facility], [address] [place], [place] [address]    Special Requests   Final    NONE Performed at [facility], [address] [place], [place] [address]    Culture >=100,000 COLONIES/mL ESCHERICHIA COLI (A)  Final   Report Status 07/20/2021 FINAL  Final   Organism ID, Bacteria ESCHERICHIA COLI (A)  Final      Susceptibility   Escherichia coli - MIC*    AMPICILLIN >=32 RESISTANT  Resistant     CEFAZOLIN <=4 SENSITIVE Sensitive     CEFEPIME <=0.12 SENSITIVE Sensitive     CEFTRIAXONE <=0.25 SENSITIVE Sensitive     CIPROFLOXACIN <=0.25 SENSITIVE Sensitive     GENTAMICIN <=1 SENSITIVE Sensitive     IMIPENEM <=0.25 SENSITIVE Sensitive     NITROFURANTOIN <=16 SENSITIVE Sensitive     TRIMETH/SULFA <=20 SENSITIVE Sensitive     AMPICILLIN/SULBACTAM 16 INTERMEDIATE Intermediate     PIP/TAZO <=4 SENSITIVE Sensitive     * >=100,000 COLONIES/mL ESCHERICHIA  COLI  Culture, blood (Routine X 2) w Reflex to ID Panel     Status: None (Preliminary result)   Collection Time: 07/18/21 11:50 PM   Specimen: BLOOD  Result Value Ref Range Status   Specimen Description BLOOD RIGHT FOREARM  Final   Special Requests   Final    BOTTLES DRAWN AEROBIC AND ANAEROBIC Blood Culture adequate volume   Culture   Final    NO GROWTH 1 DAY Performed at [facility], [address] [place], [place] [address]    Report Status PENDING  Incomplete  Culture, blood (Routine X 2) w Reflex to ID Panel     Status: None (Preliminary result)   Collection Time: 07/19/21 12:04 AM   Specimen: BLOOD  Result Value Ref Range Status   Specimen Description BLOOD LEFT ASSIST CONTROL  Final   Special Requests   Final    BOTTLES DRAWN AEROBIC AND ANAEROBIC Blood Culture adequate volume   Culture  Setup Time   Final    GRAM NEGATIVE RODS AEROBIC BOTTLE ONLY Organism ID to follow CRITICAL RESULT CALLED TO, READ BACK BY AND VERIFIED WITH: JASON ROBBINS@0232  07/20/21 RH Performed at [facility], [address] [place], [place] [address]    Culture GRAM NEGATIVE RODS  Final   Report Status PENDING  Incomplete  Blood Culture ID Panel (Reflexed)     Status: Abnormal   Collection Time: 07/19/21 12:04 AM  Result Value Ref Range Status   Enterococcus faecalis NOT DETECTED NOT DETECTED Final   Enterococcus Faecium NOT DETECTED NOT DETECTED Final   Listeria monocytogenes NOT DETECTED NOT DETECTED  Final   Staphylococcus species NOT DETECTED NOT DETECTED Final   Staphylococcus aureus (BCID) NOT DETECTED NOT DETECTED Final   Staphylococcus epidermidis NOT DETECTED NOT DETECTED Final   Staphylococcus lugdunensis NOT DETECTED NOT DETECTED Final   Streptococcus species NOT DETECTED NOT DETECTED Final   Streptococcus agalactiae NOT DETECTED NOT DETECTED Final   Streptococcus pneumoniae NOT DETECTED NOT DETECTED Final   Streptococcus pyogenes NOT DETECTED NOT DETECTED Final   A.calcoaceticus-baumannii NOT DETECTED NOT DETECTED Final   Bacteroides fragilis NOT DETECTED NOT DETECTED Final   Enterobacterales DETECTED (A) NOT DETECTED Final    Comment: Enterobacterales represent a large order of gram negative bacteria, not a single organism. CRITICAL RESULT CALLED TO, READ BACK BY AND VERIFIED WITH: JASON ROBBINS@0232  07/20/21 RH    Enterobacter cloacae complex NOT DETECTED NOT DETECTED Final   Escherichia coli DETECTED (A) NOT DETECTED Final    Comment: CRITICAL RESULT CALLED TO, READ BACK BY AND VERIFIED WITH: JASON ROBBINS@0232  07/20/21 RH    Klebsiella aerogenes NOT DETECTED NOT DETECTED Final   Klebsiella oxytoca NOT DETECTED NOT DETECTED Final   Klebsiella pneumoniae NOT DETECTED NOT DETECTED Final   Proteus species NOT DETECTED NOT DETECTED Final   Salmonella species NOT DETECTED NOT DETECTED Final   Serratia marcescens NOT DETECTED NOT DETECTED Final   Haemophilus influenzae NOT DETECTED NOT DETECTED Final   Neisseria meningitidis NOT DETECTED NOT DETECTED Final   Pseudomonas aeruginosa NOT DETECTED NOT DETECTED Final   Stenotrophomonas maltophilia NOT DETECTED NOT DETECTED Final   Candida albicans NOT DETECTED NOT DETECTED Final   Candida auris NOT DETECTED NOT DETECTED Final   Candida glabrata NOT DETECTED NOT DETECTED Final   Candida krusei NOT DETECTED NOT DETECTED Final   Candida parapsilosis NOT DETECTED NOT DETECTED Final   Candida tropicalis NOT DETECTED NOT DETECTED  Final   Cryptococcus neoformans/gattii NOT DETECTED NOT  DETECTED Final   CTX-M ESBL NOT DETECTED NOT DETECTED Final   Carbapenem resistance IMP NOT DETECTED NOT DETECTED Final   Carbapenem resistance KPC NOT DETECTED NOT DETECTED Final   Carbapenem resistance NDM NOT DETECTED NOT DETECTED Final   Carbapenem resist OXA 48 LIKE NOT DETECTED NOT DETECTED Final   Carbapenem resistance VIM NOT DETECTED NOT DETECTED Final    Comment: Performed at [facility], [address], [place], [place] [address]         Radiology Studies: DG Chest Portable 1 View  Result Date: 07/19/2021 CLINICAL DATA:  Sepsis, COVID-19 infection EXAM: PORTABLE CHEST 1 VIEW COMPARISON:  None. FINDINGS: Pulmonary insufflation is symmetric. Stable bilateral perihilar interstitial thickening likely reflecting scarring better seen on CT examination of 03/28/2015. No superimposed confluent pulmonary infiltrate. No pneumothorax or pleural effusion. Cardiac size within normal limits. Osseous structures are age-appropriate. IMPRESSION: No active disease. Electronically Signed   By: Helyn Numbers M.D.   On: 07/19/2021 01:34   CT Renal Stone Study  Result Date: 07/18/2021 CLINICAL DATA:  70 year old female with history of flank pain. Evaluate for kidney stone. EXAM: CT ABDOMEN AND PELVIS WITHOUT CONTRAST TECHNIQUE: Multidetector CT imaging of the abdomen and pelvis was performed following the standard protocol without IV contrast. COMPARISON:  CT of the abdomen and pelvis 04/03/2021. FINDINGS: Lower chest: Mild scarring in the lung bases bilaterally. Atherosclerotic calcifications in the thoracic aorta as well as the left anterior descending, left circumflex and right coronary arteries. Calcifications of the aortic valve and mitral annulus. Hepatobiliary: Mild diffuse low attenuation throughout the hepatic parenchyma, indicative of hepatic steatosis. No discrete cystic or solid hepatic lesions are confidently identified on  today's noncontrast CT examination. Unenhanced appearance of the gallbladder is unremarkable. Pancreas: No definite pancreatic mass or peripancreatic fluid collections or inflammatory changes are noted on today's noncontrast CT examination. Spleen: Unremarkable. Adrenals/Urinary Tract: Nonobstructive calculus in the lower pole collecting system of the left kidney measuring 6 mm. No additional calculi are noted within the right renal collecting system, along the course of either ureter or within the lumen of the urinary bladder. No hydroureteronephrosis. Unenhanced appearance of the kidneys, bilateral adrenal glands and urinary bladder is otherwise unremarkable. Stomach/Bowel: Unenhanced appearance of the stomach is normal. No pathologic dilatation of small bowel or colon. A few scattered colonic diverticulae are noted, without surrounding inflammatory changes to suggest an acute diverticulitis at this time. Normal appendix. Vascular/Lymphatic: Aortic atherosclerosis. No lymphadenopathy noted in the abdomen or pelvis. Reproductive: Status post hysterectomy. Ovaries are not confidently identified may be surgically absent or atrophic. Other: No significant volume of ascites.  No pneumoperitoneum. Musculoskeletal: There are no aggressive appearing lytic or blastic lesions noted in the visualized portions of the skeleton. IMPRESSION: 1. 6 mm nonobstructive calculus in the lower pole collecting system of left kidney. No ureteral stones or findings of urinary tract obstruction are noted at this time. 2. Hepatic steatosis. 3. Colonic diverticulosis without evidence of acute diverticulitis at this time. 4. Aortic atherosclerosis, in addition to at least 3 vessel coronary artery disease. Please note that although the presence of coronary artery calcium documents the presence of coronary artery disease, the severity of this disease and any potential stenosis cannot be assessed on this non-gated CT examination. Assessment for  potential risk factor modification, dietary therapy or pharmacologic therapy may be warranted, if clinically indicated. 5. There are calcifications of the aortic valve and mitral annulus. Echocardiographic correlation for evaluation of potential valvular dysfunction may be warranted if  clinically indicated. Electronically Signed   By: Trudie Reed M.D.   On: 07/18/2021 20:43      Scheduled Meds:  amLODipine  10 mg Oral Daily   aspirin EC  81 mg Oral Daily   carvedilol  25 mg Oral BID   colchicine  0.6 mg Oral Daily   enoxaparin (LOVENOX) injection  0.5 mg/kg Subcutaneous Q24H   escitalopram  10 mg Oral Daily   insulin aspart  0-15 Units Subcutaneous TID WC   losartan  100 mg Oral Daily   meloxicam  7.5 mg Oral Daily   pantoprazole  40 mg Oral Daily   Vitamin D (Ergocalciferol)  50,000 Units Oral Weekly   Continuous Infusions:  cefTRIAXone (ROCEPHIN)  IV Stopped (07/19/21 2322)   magnesium sulfate bolus IVPB       LOS: 1 day      Calvert Cantor, MD [facility] Pager: [email or web address] 07/20/2021, 11:09 AM

## 2021-07-20 NOTE — Consult Note (Signed)
Pharmacy Antibiotic Note  Betty Luna is a 70 y.o. female admitted on 07/18/2021 with bacteremia and UTI.  Blood culture and Urine culture growing E.Coli(R-ampicillin,unasyn in urine, Bcx sensitivities are pending). Pharmacy has been consulted for Cefazolin dosing. Patient has already received 2 doses of Ceftriaxone as empiric therapy.  Day 3 of antibiotics  Plan: Cefazolin 2g IV Q8 hours  Height: 5\' 1"  (154.9 cm) Weight: 98.1 kg (216 lb 4.3 oz) IBW/kg (Calculated) : 47.8  Temp (24hrs), Avg:98.2 F (36.8 C), Min:97.9 F (36.6 C), Max:98.3 F (36.8 C)  Recent Labs  Lab 07/18/21 2032 07/18/21 2151 07/19/21 0007 07/19/21 0406 07/19/21 0542  WBC 3.3*  --   --   --  9.4  CREATININE 0.92  --   --   --  0.73  LATICACIDVEN  --  5.6* 4.6* 3.9*  --     Estimated Creatinine Clearance: 71.1 mL/min (by C-G formula based on SCr of 0.73 mg/dL).    Allergies  Allergen Reactions   Omeprazole Other (See Comments)    CHEST PAIN    Tramadol Nausea Only and Nausea And Vomiting   Atorvastatin Other (See Comments)    And another statin too   Codeine Other (See Comments) and Anxiety    Reaction: jittery  Jittery    Antimicrobials this admission: Ceftriaxone 9/28 x 2 doses Cefazolin 9/29 >>   Microbiology results: 9/27 BCx: E.coli(susceptibilities pending) 9/27 UCx: >100k E.coli(R-amp,unasyn)    Thank you for allowing pharmacy to be a part of this patient's care.  Sherley Mckenney A Analiza Cowger 07/20/2021 1:40 PM

## 2021-07-21 DIAGNOSIS — R7881 Bacteremia: Secondary | ICD-10-CM

## 2021-07-21 LAB — VITAMIN B12: Vitamin B-12: 271 pg/mL (ref 180–914)

## 2021-07-21 LAB — LACTIC ACID, PLASMA: Lactic Acid, Venous: 1.4 mmol/L (ref 0.5–1.9)

## 2021-07-21 LAB — HEMOGLOBIN A1C
Hgb A1c MFr Bld: 7.4 % — ABNORMAL HIGH (ref 4.8–5.6)
Mean Plasma Glucose: 166 mg/dL

## 2021-07-21 LAB — GLUCOSE, CAPILLARY: Glucose-Capillary: 210 mg/dL — ABNORMAL HIGH (ref 70–99)

## 2021-07-21 MED ORDER — FERROUS GLUCONATE 324 (38 FE) MG PO TABS
324.0000 mg | ORAL_TABLET | Freq: Every day | ORAL | 3 refills | Status: DC
Start: 2021-07-21 — End: 2023-01-31

## 2021-07-21 MED ORDER — CEPHALEXIN 500 MG PO CAPS: 500.0000 mg | ORAL_CAPSULE | Freq: Four times a day (QID) | ORAL | 0 refills | Status: DC

## 2021-07-21 MED ORDER — FERROUS GLUCONATE 324 (38 FE) MG PO TABS
324.0000 mg | ORAL_TABLET | Freq: Every day | ORAL | Status: DC
  Filled 2021-07-21: qty 1

## 2021-07-21 MED ORDER — CEPHALEXIN 500 MG PO CAPS: 500.0000 mg | ORAL_CAPSULE | Freq: Four times a day (QID) | ORAL | 0 refills | Status: AC

## 2021-07-21 NOTE — Care Management Important Message (Signed)
Important Message  Patient Details  Name: Betty Luna MRN: [id number] Date of Birth: 1951/01/10   Medicare Important Message Given:  N/A - LOS <3 / Initial given by admissions     Johnell Comings 07/21/2021, 9:01 AM

## 2021-07-21 NOTE — Discharge Summary (Addendum)
Physician Discharge Summary  Betty Luna GMW:[id number] DOB: Mar 29, 1951 DOA: 07/18/2021  PCP: Ethelda Chick, MD  Admit date: 07/18/2021 Discharge date: 07/21/2021  Admitted From: home  Disposition:  home   Recommendations for Outpatient Follow-up:  Please f/u on Hgb in 2-3 months Consider urology eval (patient tells me she has frequent UTIs)  Home Health:  none  Discharge Condition:  stable   CODE STATUS:  Full code   Diet recommendation:   heart healthy and diabetic Consultations: none  Procedures/Studies: none   Discharge Diagnoses:  Principal Problem:   Sepsis due to e coli UTI and bactremia Active Problems: Recurrent UTIs Microcytic anemia   Type 2 diabetes mellitus without complication, with long-term current use of insulin (HCC)         Brief Summary: Betty Luna is a 70 y.o. African-American female with medical history significant for CHF, depression, type 2 diabetes mellitus, GERD, gout and hypertension, presented to the emergency room with acute onset of dysuria with associated lower abdominal suprapubic abdominal pain radiating to her back as well as nausea and vomiting with associated diarrhea.  She admitted to bilateral low back pain and malaise at home as well as tactile fever and chills.  She has a history of nephrolithiasis in the past.  No headache or dizziness or blurred vision.  No chest pain or dyspnea or palpitations.  Her cough is better and she denies wheezing.  The patient was seen in the ER on 07/12/2021 with URI symptoms and was diagnosed with COVID-19.  She finished a course of 5 days of Paxlovid .  She stated that she has been vaccinated and boosted for COVID-19.  Hospital Course:  Principal Problem: Severe sepsis due to gram-negative UTI (HCC) Gram-negative bacteremia -Found to have tachypnea tachycardia and leukopenia with symptoms of dysuria and nausea and vomiting -Lactic acid level 5.6 initially- has steadily improved to normal -  Procalcitonin 17.47 - Urine culture has grown greater than 100,000 colonies of E. coli which is sensitive to cefazolin - Blood culture has grown gram-negative rods in 1 set and on BCID this is E. coli  - Ceftriaxone narrowed to Cefazolin and then to Keflex on discharge today - She states she has had recurrent UTIs- looking back in her chart it appears that she has grown out e coli multiple times from her urine- I have told her that she may benefit form the urology evaluation and should discuss it with her doctor   Active Problems:   Type 2 diabetes mellitus without complication, with long-term current use of insulin (HCC) -Metformin  held but has been resumed on dc -Hemoglobin A1c 7.4   Hypomagnesemia - Magnesium level 0.8 I- was replaced and rechecked- found to be normal at 2.0   Elevated LFTs - AST 55, ALT 47, T bili 1.3 - She has had normal LFTs in the past-elevation possibly secondary to underlying infection/sepsis -noted to be improved to normal when rechecked   Microcytic anemia -Anemia panel reveals low iron saturation- I have explained to the patient that I will be starting a daily Iron tablet and have advised her what to do in case of constipation  Gout - Continue colchicine daily for prevention   Hypertension - Continue amlodipine, carvedilol and losartan     Discharge Exam: Vitals:   07/20/21 2126 07/20/21 2348  BP: 133/71 122/66  Pulse: 84 78  Resp:  15  Temp: 97.9 F (36.6 C) 98 F (36.7 C)  SpO2: 97% 95%   Vitals:  07/20/21 1211 07/20/21 1631 07/20/21 2126 07/20/21 2348  BP: 129/77 121/69 133/71 122/66  Pulse: 87 81 84 78  Resp: Temp: 97.9 F (36.6 C) 98 F (36.7 C) 97.9 F (36.6 C) 98 F (36.7 C)  TempSrc:      SpO2: 100% 99% 97% 95%  Weight:      Height:        General: Pt is alert, awake, not in acute distress Cardiovascular: RRR, S1/S2 +, no rubs, no gallops Respiratory: CTA bilaterally, no wheezing, no rhonchi Abdominal: Soft,  NT, ND, bowel sounds + Extremities: no edema, no cyanosis   Discharge Instructions  Discharge Instructions     Diet - low sodium heart healthy   Complete by: As directed    Diet Carb Modified   Complete by: As directed    Increase activity slowly   Complete by: As directed       Allergies as of 07/21/2021       Reactions   Omeprazole Other (See Comments)   CHEST PAIN    Tramadol Nausea Only, Nausea And Vomiting   Atorvastatin Other (See Comments)   And another statin too   Codeine Other (See Comments), Anxiety   Reaction: jittery  Jittery        Medication List     TAKE these medications    amLODipine 10 MG tablet Commonly known as: NORVASC Take 10 mg by mouth daily.   aspirin EC 81 MG tablet Take 81 mg by mouth daily.   benzonatate 100 MG capsule Commonly known as: Tessalon Perles Take 1 capsule (100 mg total) by mouth 3 (three) times daily as needed for cough.   carvedilol 25 MG tablet Commonly known as: COREG Take 25 mg by mouth 2 (two) times daily.   cephALEXin 500 MG capsule Commonly known as: KEFLEX Take 1 capsule (500 mg total) by mouth 4 (four) times daily for 7 days.   colchicine 0.6 MG tablet Take 0.6 mg by mouth daily.   ergocalciferol 1.25 MG (50000 UT) capsule Commonly known as: VITAMIN D2 ergocalciferol (vitamin D2) 1,250 mcg (50,000 unit) capsule  TAKE 1 CAPSULE BY MOUTH WEEKLY FOR 8 WEEKS   escitalopram 20 MG tablet Commonly known as: LEXAPRO Take 10 mg by mouth daily.   ferrous gluconate 324 MG tablet Commonly known as: FERGON Take 1 tablet (324 mg total) by mouth daily with breakfast.   losartan 100 MG tablet Commonly known as: COZAAR Take 100 mg by mouth daily.   meloxicam 7.5 MG tablet Commonly known as: MOBIC Take 7.5 mg by mouth daily.   metFORMIN 500 MG tablet Commonly known as: GLUCOPHAGE Take 2 tablets by mouth 2 (two) times daily.   pantoprazole 40 MG tablet Commonly known as: PROTONIX Take 40 mg by mouth  daily.   torsemide 20 MG tablet Commonly known as: DEMADEX Take 20 mg by mouth daily.        Allergies  Allergen Reactions   Omeprazole Other (See Comments)    CHEST PAIN    Tramadol Nausea Only and Nausea And Vomiting   Atorvastatin Other (See Comments)    And another statin too   Codeine Other (See Comments) and Anxiety    Reaction: jittery  Jittery      DG Chest 2 View  Result Date: 07/11/2021 CLINICAL DATA:  Productive cough with dizziness and back pain. EXAM: CHEST - 2 VIEW COMPARISON:  November 21, 2020 FINDINGS: Mild, stable, diffusely increased interstitial lung markings are  seen. Mild, stable appearing areas of scarring and/or atelectasis are seen within the bilateral lung bases. There is no evidence of a pleural effusion or pneumothorax. The heart size and mediastinal contours are within normal limits. There is marked severity calcification of the aortic arch. Multilevel degenerative changes seen throughout the thoracic spine. IMPRESSION: Mild, stable bibasilar scarring and/or atelectasis. Electronically Signed   By: Aram Candela M.D.   On: 07/11/2021 20:10   DG Chest Portable 1 View  Result Date: 07/19/2021 CLINICAL DATA:  Sepsis, COVID-19 infection EXAM: PORTABLE CHEST 1 VIEW COMPARISON:  None. FINDINGS: Pulmonary insufflation is symmetric. Stable bilateral perihilar interstitial thickening likely reflecting scarring better seen on CT examination of 03/28/2015. No superimposed confluent pulmonary infiltrate. No pneumothorax or pleural effusion. Cardiac size within normal limits. Osseous structures are age-appropriate. IMPRESSION: No active disease. Electronically Signed   By: Helyn Numbers M.D.   On: 07/19/2021 01:34   CT Renal Stone Study  Result Date: 07/18/2021 CLINICAL DATA:  70 year old female with history of flank pain. Evaluate for kidney stone. EXAM: CT ABDOMEN AND PELVIS WITHOUT CONTRAST TECHNIQUE: Multidetector CT imaging of the abdomen and pelvis was  performed following the standard protocol without IV contrast. COMPARISON:  CT of the abdomen and pelvis 04/03/2021. FINDINGS: Lower chest: Mild scarring in the lung bases bilaterally. Atherosclerotic calcifications in the thoracic aorta as well as the left anterior descending, left circumflex and right coronary arteries. Calcifications of the aortic valve and mitral annulus. Hepatobiliary: Mild diffuse low attenuation throughout the hepatic parenchyma, indicative of hepatic steatosis. No discrete cystic or solid hepatic lesions are confidently identified on today's noncontrast CT examination. Unenhanced appearance of the gallbladder is unremarkable. Pancreas: No definite pancreatic mass or peripancreatic fluid collections or inflammatory changes are noted on today's noncontrast CT examination. Spleen: Unremarkable. Adrenals/Urinary Tract: Nonobstructive calculus in the lower pole collecting system of the left kidney measuring 6 mm. No additional calculi are noted within the right renal collecting system, along the course of either ureter or within the lumen of the urinary bladder. No hydroureteronephrosis. Unenhanced appearance of the kidneys, bilateral adrenal glands and urinary bladder is otherwise unremarkable. Stomach/Bowel: Unenhanced appearance of the stomach is normal. No pathologic dilatation of small bowel or colon. A few scattered colonic diverticulae are noted, without surrounding inflammatory changes to suggest an acute diverticulitis at this time. Normal appendix. Vascular/Lymphatic: Aortic atherosclerosis. No lymphadenopathy noted in the abdomen or pelvis. Reproductive: Status post hysterectomy. Ovaries are not confidently identified may be surgically absent or atrophic. Other: No significant volume of ascites.  No pneumoperitoneum. Musculoskeletal: There are no aggressive appearing lytic or blastic lesions noted in the visualized portions of the skeleton. IMPRESSION: 1. 6 mm nonobstructive calculus  in the lower pole collecting system of left kidney. No ureteral stones or findings of urinary tract obstruction are noted at this time. 2. Hepatic steatosis. 3. Colonic diverticulosis without evidence of acute diverticulitis at this time. 4. Aortic atherosclerosis, in addition to at least 3 vessel coronary artery disease. Please note that although the presence of coronary artery calcium documents the presence of coronary artery disease, the severity of this disease and any potential stenosis cannot be assessed on this non-gated CT examination. Assessment for potential risk factor modification, dietary therapy or pharmacologic therapy may be warranted, if clinically indicated. 5. There are calcifications of the aortic valve and mitral annulus. Echocardiographic correlation for evaluation of potential valvular dysfunction may be warranted if clinically indicated. Electronically Signed   By: Brayton Mars.D.  On: 07/18/2021 20:43     The results of significant diagnostics from this hospitalization (including imaging, microbiology, ancillary and laboratory) are listed below for reference.     Microbiology: Recent Results (from the past 240 hour(s))  Resp Panel by RT-PCR (Flu A&B, Covid) Nasopharyngeal Swab     Status: Abnormal   Collection Time: 07/11/21  7:19 PM   Specimen: Nasopharyngeal Swab; Nasopharyngeal(NP) swabs in vial transport medium  Result Value Ref Range Status   SARS Coronavirus 2 by RT PCR POSITIVE (A) NEGATIVE Final    Comment: RESULT CALLED TO, READ BACK BY AND VERIFIED WITH: RAQUEL DAVID RN 2117 07/11/21 HNM (NOTE) SARS-CoV-2 target nucleic acids are DETECTED.  The SARS-CoV-2 RNA is generally detectable in upper respiratory specimens during the acute phase of infection. Positive results are indicative of the presence of the identified virus, but do not rule out bacterial infection or co-infection with other pathogens not detected by the test. Clinical correlation with patient  history and other diagnostic information is necessary to determine patient infection status. The expected result is Negative.  Fact Sheet for Patients: BloggerCourse.com  Fact Sheet for Healthcare Providers: SeriousBroker.it  This test is not yet approved or cleared by the [place] FDA and  has been authorized for detection and/or diagnosis of SARS-CoV-2 by FDA under an Emergency Use Authorization (EUA).  This EUA will remain in effect (meaning this test can be  used) for the duration of  the COVID-19 declaration under Section 564(b)(1) of the Act, 21 U.S.C. section 360bbb-3(b)(1), unless the authorization is terminated or revoked sooner.     Influenza A by PCR NEGATIVE NEGATIVE Final   Influenza B by PCR NEGATIVE NEGATIVE Final    Comment: (NOTE) The Xpert Xpress SARS-CoV-2/FLU/RSV plus assay is intended as an aid in the diagnosis of influenza from Nasopharyngeal swab specimens and should not be used as a sole basis for treatment. Nasal washings and aspirates are unacceptable for Xpert Xpress SARS-CoV-2/FLU/RSV testing.  Fact Sheet for Patients: BloggerCourse.com  Fact Sheet for Healthcare Providers: SeriousBroker.it  This test is not yet approved or cleared by the [place] FDA and has been authorized for detection and/or diagnosis of SARS-CoV-2 by FDA under an Emergency Use Authorization (EUA). This EUA will remain in effect (meaning this test can be used) for the duration of the COVID-19 declaration under Section 564(b)(1) of the Act, 21 U.S.C. section 360bbb-3(b)(1), unless the authorization is terminated or revoked.  Performed at [facility], [address] [place], [place] [address]   Urine Culture     Status: Abnormal   Collection Time: 07/18/21  8:26 PM   Specimen: Urine, Random  Result Value Ref Range Status   Specimen Description   Final     URINE, RANDOM Performed at [facility], [address] [place], [place] [address]    Special Requests   Final    NONE Performed at [facility], [address] [place], [place] [address]    Culture >=100,000 COLONIES/mL ESCHERICHIA COLI (A)  Final   Report Status 07/20/2021 FINAL  Final   Organism ID, Bacteria ESCHERICHIA COLI (A)  Final      Susceptibility   Escherichia coli - MIC*    AMPICILLIN >=32 RESISTANT Resistant     CEFAZOLIN <=4 SENSITIVE Sensitive     CEFEPIME <=0.12 SENSITIVE Sensitive     CEFTRIAXONE <=0.25 SENSITIVE Sensitive     CIPROFLOXACIN <=0.25 SENSITIVE Sensitive     GENTAMICIN <=1 SENSITIVE Sensitive  IMIPENEM <=0.25 SENSITIVE Sensitive     NITROFURANTOIN <=16 SENSITIVE Sensitive     TRIMETH/SULFA <=20 SENSITIVE Sensitive     AMPICILLIN/SULBACTAM 16 INTERMEDIATE Intermediate     PIP/TAZO <=4 SENSITIVE Sensitive     * >=100,000 COLONIES/mL ESCHERICHIA COLI  Culture, blood (Routine X 2) w Reflex to ID Panel     Status: None (Preliminary result)   Collection Time: 07/18/21 11:50 PM   Specimen: BLOOD  Result Value Ref Range Status   Specimen Description BLOOD RIGHT FOREARM  Final   Special Requests   Final    BOTTLES DRAWN AEROBIC AND ANAEROBIC Blood Culture adequate volume   Culture   Final    NO GROWTH 2 DAYS Performed at [facility], [address], [place], [place] [address]    Report Status PENDING  Incomplete  Culture, blood (Routine X 2) w Reflex to ID Panel     Status: None (Preliminary result)   Collection Time: 07/19/21 12:04 AM   Specimen: BLOOD  Result Value Ref Range Status   Specimen Description BLOOD LEFT ASSIST CONTROL  Final   Special Requests   Final    BOTTLES DRAWN AEROBIC AND ANAEROBIC Blood Culture adequate volume   Culture  Setup Time   Final    GRAM NEGATIVE RODS AEROBIC BOTTLE ONLY Organism ID to follow CRITICAL RESULT CALLED TO, READ BACK BY AND VERIFIED WITH: JASON ROBBINS@0232  07/20/21  RH Performed at [facility], [address] [place], [place] [address]    Culture GRAM NEGATIVE RODS  Final   Report Status PENDING  Incomplete  Blood Culture ID Panel (Reflexed)     Status: Abnormal   Collection Time: 07/19/21 12:04 AM  Result Value Ref Range Status   Enterococcus faecalis NOT DETECTED NOT DETECTED Final   Enterococcus Faecium NOT DETECTED NOT DETECTED Final   Listeria monocytogenes NOT DETECTED NOT DETECTED Final   Staphylococcus species NOT DETECTED NOT DETECTED Final   Staphylococcus aureus (BCID) NOT DETECTED NOT DETECTED Final   Staphylococcus epidermidis NOT DETECTED NOT DETECTED Final   Staphylococcus lugdunensis NOT DETECTED NOT DETECTED Final   Streptococcus species NOT DETECTED NOT DETECTED Final   Streptococcus agalactiae NOT DETECTED NOT DETECTED Final   Streptococcus pneumoniae NOT DETECTED NOT DETECTED Final   Streptococcus pyogenes NOT DETECTED NOT DETECTED Final   A.calcoaceticus-baumannii NOT DETECTED NOT DETECTED Final   Bacteroides fragilis NOT DETECTED NOT DETECTED Final   Enterobacterales DETECTED (A) NOT DETECTED Final    Comment: Enterobacterales represent a large order of gram negative bacteria, not a single organism. CRITICAL RESULT CALLED TO, READ BACK BY AND VERIFIED WITH: JASON ROBBINS@0232  07/20/21 RH    Enterobacter cloacae complex NOT DETECTED NOT DETECTED Final   Escherichia coli DETECTED (A) NOT DETECTED Final    Comment: CRITICAL RESULT CALLED TO, READ BACK BY AND VERIFIED WITH: JASON ROBBINS@0232  07/20/21 RH    Klebsiella aerogenes NOT DETECTED NOT DETECTED Final   Klebsiella oxytoca NOT DETECTED NOT DETECTED Final   Klebsiella pneumoniae NOT DETECTED NOT DETECTED Final   Proteus species NOT DETECTED NOT DETECTED Final   Salmonella species NOT DETECTED NOT DETECTED Final   Serratia marcescens NOT DETECTED NOT DETECTED Final   Haemophilus influenzae NOT DETECTED NOT DETECTED Final   Neisseria meningitidis NOT DETECTED  NOT DETECTED Final   Pseudomonas aeruginosa NOT DETECTED NOT DETECTED Final   Stenotrophomonas maltophilia NOT DETECTED NOT DETECTED Final   Candida albicans NOT DETECTED NOT DETECTED Final   Candida auris NOT DETECTED NOT DETECTED  Final   Candida glabrata NOT DETECTED NOT DETECTED Final   Candida krusei NOT DETECTED NOT DETECTED Final   Candida parapsilosis NOT DETECTED NOT DETECTED Final   Candida tropicalis NOT DETECTED NOT DETECTED Final   Cryptococcus neoformans/gattii NOT DETECTED NOT DETECTED Final   CTX-M ESBL NOT DETECTED NOT DETECTED Final   Carbapenem resistance IMP NOT DETECTED NOT DETECTED Final   Carbapenem resistance KPC NOT DETECTED NOT DETECTED Final   Carbapenem resistance NDM NOT DETECTED NOT DETECTED Final   Carbapenem resist OXA 48 LIKE NOT DETECTED NOT DETECTED Final   Carbapenem resistance VIM NOT DETECTED NOT DETECTED Final    Comment: Performed at [facility], [address] [place], [place] [address]     Labs: BNP (last 3 results) Recent Labs    08/30/20 2332  BNP 443.3*   Basic Metabolic Panel: Recent Labs  Lab 07/18/21 2032 07/19/21 0542 07/20/21 1354  NA 138 139 137  K 3.1* 3.8 3.3*  CL 100 98 100  CO2 GLUCOSE 269* 184* 247*  BUN CREATININE 0.92 0.73 0.79  CALCIUM 9.0 9.0 8.7*  MG  --  0.8* 2.0   Liver Function Tests: Recent Labs  Lab 07/18/21 2032 07/20/21 1354  AST 55* 27  ALT 47* 30  ALKPHOS 118 69  BILITOT 1.3* 0.7  PROT 6.9 6.2*  ALBUMIN 3.4* 2.8*   Recent Labs  Lab 07/18/21 2032  LIPASE 23   No results for input(s): AMMONIA in the last 168 hours. CBC: Recent Labs  Lab 07/18/21 2032 07/19/21 0542  WBC 3.3* 9.4  NEUTROABS 3.1  --   HGB 12.3 11.6*  HCT 37.0 35.1*  MCV 75.4* 76.1*  PLT 188 171   Cardiac Enzymes: No results for input(s): CKTOTAL, CKMB, CKMBINDEX, TROPONINI in the last 168 hours. BNP: Invalid input(s): POCBNP CBG: Recent Labs  Lab 07/20/21 1208 07/20/21 1738  07/20/21 2130  GLUCAP 229* 140* 221*   D-Dimer No results for input(s): DDIMER in the last 72 hours. Hgb A1c Recent Labs    07/20/21 1354  HGBA1C 7.4*   Lipid Profile No results for input(s): CHOL, HDL, LDLCALC, TRIG, CHOLHDL, LDLDIRECT in the last 72 hours. Thyroid function studies No results for input(s): TSH, T4TOTAL, T3FREE, THYROIDAB in the last 72 hours.  Invalid input(s): FREET3 Anemia work up Recent Labs    07/20/21 1354  FOLATE 9.8  FERRITIN 87  TIBC 273  IRON 19*  RETICCTPCT 1.2   Urinalysis    Component Value Date/Time   COLORURINE YELLOW (A) 07/18/2021 2026   APPEARANCEUR CLOUDY (A) 07/18/2021 2026   APPEARANCEUR Hazy 09/18/2013 0828   LABSPEC 1.012 07/18/2021 2026   LABSPEC 1.013 09/18/2013 0828   PHURINE 5.0 07/18/2021 2026   GLUCOSEU 50 (A) 07/18/2021 2026   GLUCOSEU Negative 09/18/2013 0828   HGBUR SMALL (A) 07/18/2021 2026   BILIRUBINUR NEGATIVE 07/18/2021 2026   BILIRUBINUR Negative 09/18/2013 0828   KETONESUR NEGATIVE 07/18/2021 2026   PROTEINUR 100 (A) 07/18/2021 2026   NITRITE NEGATIVE 07/18/2021 2026   LEUKOCYTESUR LARGE (A) 07/18/2021 2026   LEUKOCYTESUR 1+ 09/18/2013 0828   Sepsis Labs Invalid input(s): PROCALCITONIN,  WBC,  LACTICIDVEN Microbiology Recent Results (from the past 240 hour(s))  Resp Panel by RT-PCR (Flu A&B, Covid) Nasopharyngeal Swab     Status: Abnormal   Collection Time: 07/11/21  7:19 PM   Specimen: Nasopharyngeal Swab; Nasopharyngeal(NP) swabs in vial transport medium  Result Value Ref Range Status   SARS  Coronavirus 2 by RT PCR POSITIVE (A) NEGATIVE Final    Comment: RESULT CALLED TO, READ BACK BY AND VERIFIED WITH: RAQUEL DAVID RN 2117 07/11/21 HNM (NOTE) SARS-CoV-2 target nucleic acids are DETECTED.  The SARS-CoV-2 RNA is generally detectable in upper respiratory specimens during the acute phase of infection. Positive results are indicative of the presence of the identified virus, but do not rule out  bacterial infection or co-infection with other pathogens not detected by the test. Clinical correlation with patient history and other diagnostic information is necessary to determine patient infection status. The expected result is Negative.  Fact Sheet for Patients: BloggerCourse.com  Fact Sheet for Healthcare Providers: SeriousBroker.it  This test is not yet approved or cleared by the [place] FDA and  has been authorized for detection and/or diagnosis of SARS-CoV-2 by FDA under an Emergency Use Authorization (EUA).  This EUA will remain in effect (meaning this test can be  used) for the duration of  the COVID-19 declaration under Section 564(b)(1) of the Act, 21 U.S.C. section 360bbb-3(b)(1), unless the authorization is terminated or revoked sooner.     Influenza A by PCR NEGATIVE NEGATIVE Final   Influenza B by PCR NEGATIVE NEGATIVE Final    Comment: (NOTE) The Xpert Xpress SARS-CoV-2/FLU/RSV plus assay is intended as an aid in the diagnosis of influenza from Nasopharyngeal swab specimens and should not be used as a sole basis for treatment. Nasal washings and aspirates are unacceptable for Xpert Xpress SARS-CoV-2/FLU/RSV testing.  Fact Sheet for Patients: BloggerCourse.com  Fact Sheet for Healthcare Providers: SeriousBroker.it  This test is not yet approved or cleared by the [place] FDA and has been authorized for detection and/or diagnosis of SARS-CoV-2 by FDA under an Emergency Use Authorization (EUA). This EUA will remain in effect (meaning this test can be used) for the duration of the COVID-19 declaration under Section 564(b)(1) of the Act, 21 U.S.C. section 360bbb-3(b)(1), unless the authorization is terminated or revoked.  Performed at [facility], [address] [place], [place] [address]   Urine Culture     Status: Abnormal   Collection  Time: 07/18/21  8:26 PM   Specimen: Urine, Random  Result Value Ref Range Status   Specimen Description   Final    URINE, RANDOM Performed at [facility], [address] [place], [place] [address]    Special Requests   Final    NONE Performed at [facility], [address] [place], [place] [address]    Culture >=100,000 COLONIES/mL ESCHERICHIA COLI (A)  Final   Report Status 07/20/2021 FINAL  Final   Organism ID, Bacteria ESCHERICHIA COLI (A)  Final      Susceptibility   Escherichia coli - MIC*    AMPICILLIN >=32 RESISTANT Resistant     CEFAZOLIN <=4 SENSITIVE Sensitive     CEFEPIME <=0.12 SENSITIVE Sensitive     CEFTRIAXONE <=0.25 SENSITIVE Sensitive     CIPROFLOXACIN <=0.25 SENSITIVE Sensitive     GENTAMICIN <=1 SENSITIVE Sensitive     IMIPENEM <=0.25 SENSITIVE Sensitive     NITROFURANTOIN <=16 SENSITIVE Sensitive     TRIMETH/SULFA <=20 SENSITIVE Sensitive     AMPICILLIN/SULBACTAM 16 INTERMEDIATE Intermediate     PIP/TAZO <=4 SENSITIVE Sensitive     * >=100,000 COLONIES/mL ESCHERICHIA COLI  Culture, blood (Routine X 2) w Reflex to ID Panel     Status: None (Preliminary result)   Collection Time: 07/18/21 11:50 PM   Specimen: BLOOD  Result Value Ref Range Status  Specimen Description BLOOD RIGHT FOREARM  Final   Special Requests   Final    BOTTLES DRAWN AEROBIC AND ANAEROBIC Blood Culture adequate volume   Culture   Final    NO GROWTH 2 DAYS Performed at [facility], [address] [place], [place] [address]    Report Status PENDING  Incomplete  Culture, blood (Routine X 2) w Reflex to ID Panel     Status: None (Preliminary result)   Collection Time: 07/19/21 12:04 AM   Specimen: BLOOD  Result Value Ref Range Status   Specimen Description BLOOD LEFT ASSIST CONTROL  Final   Special Requests   Final    BOTTLES DRAWN AEROBIC AND ANAEROBIC Blood Culture adequate volume   Culture  Setup Time   Final    GRAM NEGATIVE RODS AEROBIC BOTTLE  ONLY Organism ID to follow CRITICAL RESULT CALLED TO, READ BACK BY AND VERIFIED WITH: JASON [email or web address]  07/20/21 RH Performed at [facility], [address] [place], [place] [address]    Culture GRAM NEGATIVE RODS  Final   Report Status PENDING  Incomplete  Blood Culture ID Panel (Reflexed)     Status: Abnormal   Collection Time: 07/19/21 12:04 AM  Result Value Ref Range Status   Enterococcus faecalis NOT DETECTED NOT DETECTED Final   Enterococcus Faecium NOT DETECTED NOT DETECTED Final   Listeria monocytogenes NOT DETECTED NOT DETECTED Final   Staphylococcus species NOT DETECTED NOT DETECTED Final   Staphylococcus aureus (BCID) NOT DETECTED NOT DETECTED Final   Staphylococcus epidermidis NOT DETECTED NOT DETECTED Final   Staphylococcus lugdunensis NOT DETECTED NOT DETECTED Final   Streptococcus species NOT DETECTED NOT DETECTED Final   Streptococcus agalactiae NOT DETECTED NOT DETECTED Final   Streptococcus pneumoniae NOT DETECTED NOT DETECTED Final   Streptococcus pyogenes NOT DETECTED NOT DETECTED Final   A.calcoaceticus-baumannii NOT DETECTED NOT DETECTED Final   Bacteroides fragilis NOT DETECTED NOT DETECTED Final   Enterobacterales DETECTED (A) NOT DETECTED Final    Comment: Enterobacterales represent a large order of gram negative bacteria, not a single organism. CRITICAL RESULT CALLED TO, READ BACK BY AND VERIFIED WITH: JASON ROBBINS@[email or web address]  07/20/21 RH    Enterobacter cloacae complex NOT DETECTED NOT DETECTED Final   Escherichia coli DETECTED (A) NOT DETECTED Final    Comment: CRITICAL RESULT CALLED TO, READ BACK BY AND VERIFIED WITH: JASON ROBBINS@0232  07/20/21 RH    Klebsiella aerogenes NOT DETECTED NOT DETECTED Final   Klebsiella oxytoca NOT DETECTED NOT DETECTED Final   Klebsiella pneumoniae NOT DETECTED NOT DETECTED Final   Proteus species NOT DETECTED NOT DETECTED Final   Salmonella species NOT DETECTED NOT DETECTED Final   Serratia marcescens NOT DETECTED  NOT DETECTED Final   Haemophilus influenzae NOT DETECTED NOT DETECTED Final   Neisseria meningitidis NOT DETECTED NOT DETECTED Final   Pseudomonas aeruginosa NOT DETECTED NOT DETECTED Final   Stenotrophomonas maltophilia NOT DETECTED NOT DETECTED Final   Candida albicans NOT DETECTED NOT DETECTED Final   Candida auris NOT DETECTED NOT DETECTED Final   Candida glabrata NOT DETECTED NOT DETECTED Final   Candida krusei NOT DETECTED NOT DETECTED Final   Candida parapsilosis NOT DETECTED NOT DETECTED Final   Candida tropicalis NOT DETECTED NOT DETECTED Final   Cryptococcus neoformans/gattii NOT DETECTED NOT DETECTED Final   CTX-M ESBL NOT DETECTED NOT DETECTED Final   Carbapenem resistance IMP NOT DETECTED NOT DETECTED Final   Carbapenem resistance KPC NOT DETECTED NOT DETECTED Final   Carbapenem resistance NDM NOT DETECTED NOT  DETECTED Final   Carbapenem resist OXA 48 LIKE NOT DETECTED NOT DETECTED Final   Carbapenem resistance VIM NOT DETECTED NOT DETECTED Final    Comment: Performed at [facility], [address] [place], [place] [address]     Time coordinating discharge in minutes: 65  SIGNED:   Calvert Cantor, MD  [facility] 07/21/2021, 8:08 AM

## 2021-07-21 NOTE — Progress Notes (Signed)
Discharge instructions given to patient. Verbalized understanding. No acute distress at this time. Patient escorted to medical mall via wheelchair. Husband transporting home.

## 2021-07-22 DIAGNOSIS — D509 Iron deficiency anemia, unspecified: Secondary | ICD-10-CM

## 2021-07-22 LAB — CULTURE, BLOOD (ROUTINE X 2): Special Requests: ADEQUATE

## 2021-07-24 LAB — CULTURE, BLOOD (ROUTINE X 2)
Culture: NO GROWTH
Special Requests: ADEQUATE

## 2021-11-27 ENCOUNTER — Encounter: Payer: Self-pay | Admitting: Cardiology

## 2021-11-27 ENCOUNTER — Ambulatory Visit: Payer: Medicare HMO | Admitting: Cardiology

## 2021-11-27 ENCOUNTER — Other Ambulatory Visit: Payer: Self-pay

## 2021-11-27 VITALS — BP 140/66 | HR 85 | Ht 63.0 in | Wt 216.0 lb

## 2021-11-27 DIAGNOSIS — Z6838 Body mass index (BMI) 38.0-38.9, adult: Secondary | ICD-10-CM

## 2021-11-27 DIAGNOSIS — R072 Precordial pain: Secondary | ICD-10-CM

## 2021-11-27 DIAGNOSIS — I10 Essential (primary) hypertension: Secondary | ICD-10-CM

## 2021-11-27 NOTE — Progress Notes (Signed)
Cardiology Office Note:    Date:  11/27/2021   ID:  Betty Luna, DOB [birth year], MRN [id number]  PCP:  Ethelda Chick, MD   [facility] Providers Cardiologist:  Debbe Odea, MD     Referring MD: Ethelda Chick, MD   Chief Complaint  Patient presents with   New Patient (Initial Visit)    Referred by PCP for Chest pain. Meds reviewed verbally with patient.     History of Present Illness:    Betty Luna is a 71 y.o. female with a hx of hypertension, hyperlipidemia who presents due to chest pain.  Patient has nonspecific chest discomfort not related with exertion.  Symptoms last couple of seconds and then go away.  She saw her primary care provider who recommended she gets a stress test.  She states being under a lot of stress lately, recently lost both her daughter and stepdaughter 5 months ago.  Husband likely has Alzheimer's.  She denies any personal history of heart disease, takes medications for blood pressure for years now.  Systolic usually in the 140s at home.   Echo 06/2021 EF 50 to 55%.  Past Medical History:  Diagnosis Date   Anemia    Arthritis    CHF (congestive heart failure) (HCC)    Depression    Diabetes mellitus without complication (HCC)    GERD (gastroesophageal reflux disease)    Gout    Hypertension     Past Surgical History:  Procedure Laterality Date   ABDOMINAL HYSTERECTOMY     CATARACT EXTRACTION W/PHACO Right 04/16/2017   Procedure: CATARACT EXTRACTION PHACO AND INTRAOCULAR LENS PLACEMENT (IOC);  Surgeon: Galen Manila, MD;  Location: [facility] ORS;  Service: Ophthalmology;  Laterality: Right;  [place] 01:06 AP% 16.5 CDE 11.03 Fluid pack lot # [id number]   CATARACT EXTRACTION W/PHACO Left 10/01/2017   Procedure: CATARACT EXTRACTION PHACO AND INTRAOCULAR LENS PLACEMENT (IOC)-LEFT DIABETIC;  Surgeon: Galen Manila, MD;  Location: [facility] ORS;  Service: Ophthalmology;  Laterality: Left;  [place] 01:05.7 AP% 12.2 CDE 7.42 Fluid pack lot #  [id number]   CTR Left    TONSILLECTOMY      Current Medications: Current Meds  Medication Sig   amLODipine (NORVASC) 10 MG tablet Take 10 mg by mouth daily.   aspirin EC 81 MG tablet Take 81 mg by mouth daily.   benzonatate (TESSALON PERLES) 100 MG capsule Take 1 capsule (100 mg total) by mouth 3 (three) times daily as needed for cough.   carvedilol (COREG) 25 MG tablet Take 25 mg by mouth 2 (two) times daily.    colchicine 0.6 MG tablet Take 0.6 mg by mouth daily.   ergocalciferol (VITAMIN D2) 1.25 MG (50000 UT) capsule ergocalciferol (vitamin D2) 1,250 mcg (50,000 unit) capsule  TAKE 1 CAPSULE BY MOUTH WEEKLY FOR 8 WEEKS   escitalopram (LEXAPRO) 20 MG tablet Take 10 mg by mouth daily.   ferrous gluconate (FERGON) 324 MG tablet Take 1 tablet (324 mg total) by mouth daily with breakfast.   losartan (COZAAR) 100 MG tablet Take 100 mg by mouth daily.   meloxicam (MOBIC) 7.5 MG tablet Take 7.5 mg by mouth daily.   metFORMIN (GLUCOPHAGE) 500 MG tablet Take 2 tablets by mouth 2 (two) times daily.   pantoprazole (PROTONIX) 40 MG tablet Take 40 mg by mouth daily.   torsemide (DEMADEX) 20 MG tablet Take 20 mg by mouth daily.     Allergies:   Omeprazole, Tramadol, Atorvastatin, and Codeine   Social History   Socioeconomic History  Marital status: Married    Spouse name: Not on file   Number of children: Not on file   Years of education: Not on file   Highest education level: Not on file  Occupational History   Not on file  Tobacco Use   Smoking status: Former   Smokeless tobacco: Current    Types: Snuff  Vaping Use   Vaping Use: Never used  Substance and Sexual Activity   Alcohol use: Yes    Comment: occasonally   Drug use: No   Sexual activity: Not on file  Other Topics Concern   Not on file  Social History Narrative   Not on file   Social Determinants of Health   Financial Resource Strain: Not on file  Food Insecurity: Not on file  Transportation Needs: Not on file   Physical Activity: Not on file  Stress: Not on file  Social Connections: Not on file     Family History: The patient's family history includes Alcohol abuse in her brother; Breast cancer in her sister.  ROS:   Please see the history of present illness.     All other systems reviewed and are negative.  EKGs/Labs/Other Studies Reviewed:    The following studies were reviewed today:   EKG:  EKG is  ordered today.  The ekg ordered today demonstrates sinus rhythm, left bundle branch block  Recent Labs: 07/19/2021: Hemoglobin 11.6; Platelets 171 07/20/2021: ALT 30; BUN 15; Creatinine, Ser 0.79; Magnesium 2.0; Potassium 3.3; Sodium 137  Recent Lipid Panel No results found for: CHOL, TRIG, HDL, CHOLHDL, VLDL, LDLCALC, LDLDIRECT   Risk Assessment/Calculations:          Physical Exam:    VS:  BP 140/66 (BP Location: Left Arm, Patient Position: Sitting, Cuff Size: Normal)    Pulse 85    Ht  (1.6 m)    Wt 216 lb (98 kg)    SpO2 98%    BMI 38.26 kg/m     Wt Readings from Last 3 Encounters:  11/27/21 216 lb (98 kg)  07/19/21 216 lb 4.3 oz (98.1 kg)  07/11/21 213 lb (96.6 kg)     GEN:  Well nourished, well developed in no acute distress HEENT: Normal NECK: No JVD; No carotid bruits LYMPHATICS: No lymphadenopathy CARDIAC: RRR, no murmurs, rubs, gallops RESPIRATORY:  Clear to auscultation without rales, wheezing or rhonchi  ABDOMEN: Soft, non-tender, non-distended MUSCULOSKELETAL:  No edema; No deformity  SKIN: Warm and dry NEUROLOGIC:  Alert and oriented x 3 PSYCHIATRIC:  Normal affect   ASSESSMENT:    1. Precordial pain   2. Primary hypertension   3. Adult BMI 38.0-38.9 kg/sq m    PLAN:    In order of problems listed above:  Chest pain, risk factors hypertension, age, obesity.  Obtain echocardiogram, get Lexiscan Myoview to evaluate ischemia. Hypertension, BP slightly elevated, low-salt diet advised.  Continue Coreg, losartan, amlodipine. Obesity, low-calorie  diet, weight loss advised.     Follow-up after stress testing.  Shared Decision Making/Informed Consent The risks [chest pain, shortness of breath, cardiac arrhythmias, dizziness, blood pressure fluctuations, myocardial infarction, stroke/transient ischemic attack, nausea, vomiting, allergic reaction, radiation exposure, metallic taste sensation and life-threatening complications (estimated to be 1 in 10,000)], benefits (risk stratification, diagnosing coronary artery disease, treatment guidance) and alternatives of a nuclear stress test were discussed in detail with Ms. Brosseau and she agrees to proceed.    Medication Adjustments/Labs and Tests Ordered: Current medicines are reviewed at length with the patient  today.  Concerns regarding medicines are outlined above.  Orders Placed This Encounter  Procedures   NM Myocar Multi W/Spect W/Wall Motion / EF   EKG 12-Lead   ECHOCARDIOGRAM COMPLETE   No orders of the defined types were placed in this encounter.   Patient Instructions  Medication Instructions:   Your physician recommends that you continue on your current medications as directed. Please refer to the Current Medication list given to you today.  *If you need a refill on your cardiac medications before your next appointment, please call your pharmacy*   Lab Work:  None ordered  If you have labs (blood work) drawn today and your tests are completely normal, you will receive your results only by: MyChart Message (if you have MyChart) OR A paper copy in the mail If you have any lab test that is abnormal or we need to change your treatment, we will call you to review the results.   Testing/Procedures:  Your physician has requested that you have an echocardiogram. Echocardiography is a painless test that uses sound waves to create images of your heart. It provides your doctor with information about the size and shape of your heart and how well your hearts chambers and valves  are working. This procedure takes approximately one hour. There are no restrictions for this procedure.   2.   [facility] MYOVIEW      Your caregiver has ordered a Stress Test with nuclear imaging. The purpose of this test is to evaluate the blood supply to your heart muscle. This procedure is referred to as a "Non-Invasive Stress Test." This is because other than having an IV started in your vein, nothing is inserted or "invades" your body. Cardiac stress tests are done to find areas of poor blood flow to the heart by determining the extent of coronary artery disease (CAD). Some patients exercise on a treadmill, which naturally increases the blood flow to your heart, while others who are  unable to walk on a treadmill due to physical limitations have a pharmacologic/chemical stress agent called Lexiscan . This medicine will mimic walking on a treadmill by temporarily increasing your coronary blood flow.      PLEASE REPORT TO [facility] MEDICAL MALL ENTRANCE   THE VOLUNTEERS AT THE FIRST DESK WILL DIRECT YOU WHERE TO GO     *Please note: these test may take anywhere between 2-4 hours to complete       Date of Procedure:_____________________________________   Arrival Time for Procedure:______________________________    PLEASE NOTIFY THE OFFICE AT LEAST 24 HOURS IN ADVANCE IF YOU ARE UNABLE TO KEEP YOUR APPOINTMENT.  [phone]  PLEASE NOTIFY NUCLEAR MEDICINE AT [facility] AT LEAST 24 HOURS IN ADVANCE IF YOU ARE UNABLE TO KEEP YOUR APPOINTMENT. [phone]         How to prepare for your Myoview test:         _XX___:  Hold diabetes medication the morning of procedure:   metFORMIN (GLUCOPHAGE)  _XX___:  Hold other medications as follows:  torsemide (DEMADEX)    1. Do not eat or drink after midnight  2. No caffeine for 24 hours prior to test  3. No smoking 24 hours prior to test.  4. Unless instructed otherwise, Take your medication with a small sips of water.    5.         Ladies, please do  not wear dresses. Skirts or pants are appropriate. Please wear a short sleeve shirt.  6. No perfume, cologne or  lotion.  7. Wear comfortable walking shoes. No heels!     Follow-Up: At [facility], you and your health needs are our priority.  As part of our continuing mission to provide you with exceptional heart care, we have created designated Provider Care Teams.  These Care Teams include your primary Cardiologist (physician) and Advanced Practice Providers (APPs -  Physician Assistants and Nurse Practitioners) who all work together to provide you with the care you need, when you need it.  We recommend signing up for the patient portal called "MyChart".  Sign up information is provided on this After Visit Summary.  MyChart is used to connect with patients for Virtual Visits (Telemedicine).  Patients are able to view lab/test results, encounter notes, upcoming appointments, etc.  Non-urgent messages can be sent to your provider as well.   To learn more about what you can do with MyChart, go to [email or web address].    Your next appointment:   Follow up after testing   The format for your next appointment:   In Person  Provider:   You may see Debbe Odea, MD or one of the following Advanced Practice Providers on your designated Care Team:   Nicolasa Ducking, NP Eula Listen, PA-C Cadence Fransico Michael, [place]    Other Instructions     Signed, Debbe Odea, MD  11/27/2021 11:54 AM    [facility]

## 2021-11-27 NOTE — Patient Instructions (Signed)
Medication Instructions:   Your physician recommends that you continue on your current medications as directed. Please refer to the Current Medication list given to you today.  *If you need a refill on your cardiac medications before your next appointment, please call your pharmacy*   Lab Work:  None ordered  If you have labs (blood work) drawn today and your tests are completely normal, you will receive your results only by: MyChart Message (if you have MyChart) OR A paper copy in the mail If you have any lab test that is abnormal or we need to change your treatment, we will call you to review the results.   Testing/Procedures:  Your physician has requested that you have an echocardiogram. Echocardiography is a painless test that uses sound waves to create images of your heart. It provides your doctor with information about the size and shape of your heart and how well your hearts chambers and valves are working. This procedure takes approximately one hour. There are no restrictions for this procedure.   2.   [facility] MYOVIEW      Your caregiver has ordered a Stress Test with nuclear imaging. The purpose of this test is to evaluate the blood supply to your heart muscle. This procedure is referred to as a "Non-Invasive Stress Test." This is because other than having an IV started in your vein, nothing is inserted or "invades" your body. Cardiac stress tests are done to find areas of poor blood flow to the heart by determining the extent of coronary artery disease (CAD). Some patients exercise on a treadmill, which naturally increases the blood flow to your heart, while others who are  unable to walk on a treadmill due to physical limitations have a pharmacologic/chemical stress agent called Lexiscan . This medicine will mimic walking on a treadmill by temporarily increasing your coronary blood flow.      PLEASE REPORT TO [facility] MEDICAL MALL ENTRANCE   THE VOLUNTEERS AT THE FIRST DESK WILL  DIRECT YOU WHERE TO GO     *Please note: these test may take anywhere between 2-4 hours to complete       Date of Procedure:_____________________________________   Arrival Time for Procedure:______________________________    PLEASE NOTIFY THE OFFICE AT LEAST 24 HOURS IN ADVANCE IF YOU ARE UNABLE TO KEEP YOUR APPOINTMENT.  [phone]  PLEASE NOTIFY NUCLEAR MEDICINE AT [facility] AT LEAST 24 HOURS IN ADVANCE IF YOU ARE UNABLE TO KEEP YOUR APPOINTMENT. [phone]         How to prepare for your Myoview test:         _XX___:  Hold diabetes medication the morning of procedure:   metFORMIN (GLUCOPHAGE)  _XX___:  Hold other medications as follows:  torsemide (DEMADEX)    1. Do not eat or drink after midnight  2. No caffeine for 24 hours prior to test  3. No smoking 24 hours prior to test.  4. Unless instructed otherwise, Take your medication with a small sips of water.    5.         Ladies, please do not wear dresses. Skirts or pants are appropriate. Please wear a short sleeve shirt.  6. No perfume, cologne or lotion.  7. Wear comfortable walking shoes. No heels!     Follow-Up: At [facility], you and your health needs are our priority.  As part of our continuing mission to provide you with exceptional heart care, we have created designated Provider Care Teams.  These Care Teams include your  primary Cardiologist (physician) and Advanced Practice Providers (APPs -  Physician Assistants and Nurse Practitioners) who all work together to provide you with the care you need, when you need it.  We recommend signing up for the patient portal called "MyChart".  Sign up information is provided on this After Visit Summary.  MyChart is used to connect with patients for Virtual Visits (Telemedicine).  Patients are able to view lab/test results, encounter notes, upcoming appointments, etc.  Non-urgent messages can be sent to your provider as well.   To learn more about what you can do with  MyChart, go to [email or web address].    Your next appointment:   Follow up after testing   The format for your next appointment:   In Person  Provider:   You may see Debbe Odea, MD or one of the following Advanced Practice Providers on your designated Care Team:   Nicolasa Ducking, NP Eula Listen, PA-C Cadence Fransico Michael, [place]    Other Instructions

## 2021-12-06 ENCOUNTER — Encounter
Admission: RE | Admit: 2021-12-06 | Discharge: 2021-12-06 | Disposition: A | Discharge status: Home / Self Care | Payer: Medicare HMO | Source: Ambulatory Visit | Attending: Cardiology | Admitting: Cardiology

## 2021-12-06 ENCOUNTER — Other Ambulatory Visit: Payer: Self-pay

## 2021-12-06 DIAGNOSIS — R072 Precordial pain: Secondary | ICD-10-CM | POA: Insufficient documentation

## 2021-12-06 LAB — NM MYOCAR MULTI W/SPECT W/WALL MOTION / EF
LV dias vol: 143 mL (ref 46–106)
LV sys vol: 95 mL
MPHR: 150 {beats}/min
Nuc Stress EF: 34 %
Peak HR: 94 {beats}/min
Percent HR: 62 %
Rest HR: 77 {beats}/min
Rest Nuclear Isotope Dose: 10.8 mCi
SDS: 3
SRS: 11
SSS: 6
Stress Nuclear Isotope Dose: 30.8 mCi
TID: 0.92

## 2021-12-06 MED ORDER — TECHNETIUM TC 99M TETROFOSMIN IV KIT
10.0000 | PACK | Freq: Once | INTRAVENOUS | Status: AC | PRN
  Administered 2021-12-06: 10.75 via INTRAVENOUS

## 2021-12-06 MED ORDER — TECHNETIUM TC 99M TETROFOSMIN IV KIT
30.8000 | PACK | Freq: Once | INTRAVENOUS | Status: AC | PRN
  Administered 2021-12-06: 30.8 via INTRAVENOUS

## 2021-12-06 MED ORDER — REGADENOSON 0.4 MG/5ML IV SOLN
0.4000 mg | Freq: Once | INTRAVENOUS | Status: AC
  Administered 2021-12-06: 0.4 mg via INTRAVENOUS

## 2021-12-10 ENCOUNTER — Emergency Department: Payer: Medicare HMO

## 2021-12-10 ENCOUNTER — Encounter: Payer: Self-pay | Admitting: Emergency Medicine

## 2021-12-10 ENCOUNTER — Other Ambulatory Visit: Payer: Self-pay

## 2021-12-10 ENCOUNTER — Emergency Department
Admission: EM | Admit: 2021-12-10 | Discharge: 2021-12-11 | Disposition: A | Discharge status: Home / Self Care | Payer: Medicare HMO | Attending: Emergency Medicine | Admitting: Emergency Medicine

## 2021-12-10 DIAGNOSIS — Z7984 Long term (current) use of oral hypoglycemic drugs: Secondary | ICD-10-CM | POA: Diagnosis not present

## 2021-12-10 DIAGNOSIS — Z7982 Long term (current) use of aspirin: Secondary | ICD-10-CM | POA: Diagnosis not present

## 2021-12-10 DIAGNOSIS — I509 Heart failure, unspecified: Secondary | ICD-10-CM | POA: Diagnosis not present

## 2021-12-10 DIAGNOSIS — I110 Hypertensive heart disease with heart failure: Secondary | ICD-10-CM | POA: Insufficient documentation

## 2021-12-10 DIAGNOSIS — Z79899 Other long term (current) drug therapy: Secondary | ICD-10-CM | POA: Diagnosis not present

## 2021-12-10 DIAGNOSIS — R0789 Other chest pain: Secondary | ICD-10-CM | POA: Diagnosis not present

## 2021-12-10 DIAGNOSIS — E119 Type 2 diabetes mellitus without complications: Secondary | ICD-10-CM | POA: Diagnosis not present

## 2021-12-10 DIAGNOSIS — R079 Chest pain, unspecified: Secondary | ICD-10-CM | POA: Diagnosis present

## 2021-12-10 LAB — CBC
HCT: 37.7 % (ref 36.0–46.0)
Hemoglobin: 12.1 g/dL (ref 12.0–15.0)
MCH: 24.1 pg — ABNORMAL LOW (ref 26.0–34.0)
MCHC: 32.1 g/dL (ref 30.0–36.0)
MCV: 75 fL — ABNORMAL LOW (ref 80.0–100.0)
Platelets: 239 10*3/uL (ref 150–400)
RBC: 5.03 MIL/uL (ref 3.87–5.11)
RDW: 15.7 % — ABNORMAL HIGH (ref 11.5–15.5)
WBC: 8.1 10*3/uL (ref 4.0–10.5)
nRBC: 0 % (ref 0.0–0.2)

## 2021-12-10 LAB — BASIC METABOLIC PANEL
Anion gap: 9 (ref 5–15)
BUN: 19 mg/dL (ref 8–23)
CO2: 27 mmol/L (ref 22–32)
Calcium: 9.4 mg/dL (ref 8.9–10.3)
Chloride: 102 mmol/L (ref 98–111)
Creatinine, Ser: 0.79 mg/dL (ref 0.44–1.00)
GFR, Estimated: >60 mL/min (ref >60)
Glucose, Bld: 185 mg/dL — ABNORMAL HIGH (ref 70–99)
Potassium: 3.7 mmol/L (ref 3.5–5.1)
Sodium: 138 mmol/L (ref 135–145)

## 2021-12-10 LAB — TROPONIN I (HIGH SENSITIVITY): Troponin I (High Sensitivity): 12 ng/L (ref <18)

## 2021-12-10 NOTE — ED Triage Notes (Signed)
Pt to ED from home c/o left chest pain intermittently radiating to left arm, right chest and back since last night.  States pain is tight.  Takes 81mg  ASA daily, states non productive cough.  Had stress test done Thursday without complications.  Pt A&Ox4, chest rise even and unlabored, skin WNL and in NAD at this time.

## 2021-12-11 LAB — TROPONIN I (HIGH SENSITIVITY): Troponin I (High Sensitivity): 11 ng/L (ref <18)

## 2021-12-11 MED ORDER — ACETAMINOPHEN 500 MG PO TABS
1000.0000 mg | ORAL_TABLET | Freq: Once | ORAL | Status: AC
  Administered 2021-12-11: 1000 mg via ORAL
  Filled 2021-12-11: qty 2

## 2021-12-11 NOTE — ED Provider Notes (Signed)
[facility] Provider Note    Event Date/Time   First MD Initiated Contact with Patient 12/10/21 2334     (approximate)   History   Chest Pain   HPI  Betty Luna is a 71 y.o. female with history of hypertension, diabetes, CHF who presents to the emergency department with intermittent left-sided chest pain that started yesterday.  She describes as a squeezing pain that would last for a few seconds and then resolved.  She is also having some soreness in her upper neck and back.  No injury that she can recall.  She denies any aggravating or alleviating factors.  No associated shortness of breath, nausea, vomiting, diaphoresis or dizziness.  No fevers or cough.  No lower extremity swelling or pain.  Denies any history of PE or DVT.  She is not having any active chest pain at this time but feels the soreness in her back.  She states she has had similar symptoms before and has been told that it was not a heart attack.   History provided by patient and husband.    Past Medical History:  Diagnosis Date   Anemia    Arthritis    CHF (congestive heart failure) (HCC)    Depression    Diabetes mellitus without complication (HCC)    GERD (gastroesophageal reflux disease)    Gout    Hypertension     Past Surgical History:  Procedure Laterality Date   ABDOMINAL HYSTERECTOMY     CATARACT EXTRACTION W/PHACO Right 04/16/2017   Procedure: CATARACT EXTRACTION PHACO AND INTRAOCULAR LENS PLACEMENT (IOC);  Surgeon: Galen Manila, MD;  Location: [facility] ORS;  Service: Ophthalmology;  Laterality: Right;  [place] 01:06 AP% 16.5 CDE 11.03 Fluid pack lot # [id number]   CATARACT EXTRACTION W/PHACO Left 10/01/2017   Procedure: CATARACT EXTRACTION PHACO AND INTRAOCULAR LENS PLACEMENT (IOC)-LEFT DIABETIC;  Surgeon: Galen Manila, MD;  Location: [facility] ORS;  Service: Ophthalmology;  Laterality: Left;  [place] 01:05.7 AP% 12.2 CDE 7.42 Fluid pack lot # [id number]   CTR Left     TONSILLECTOMY      MEDICATIONS:  Prior to Admission medications   Medication Sig Start Date End Date Taking? Authorizing Provider  amLODipine (NORVASC) 10 MG tablet Take 10 mg by mouth daily. 03/24/21   [provider]  aspirin EC 81 MG tablet Take 81 mg by mouth daily.    [provider]  benzonatate (TESSALON PERLES) 100 MG capsule Take 1 capsule (100 mg total) by mouth 3 (three) times daily as needed for cough. 07/12/21 07/12/22  Stesha Neyens, Layla Maw, DO  carvedilol (COREG) 25 MG tablet Take 25 mg by mouth 2 (two) times daily.  02/14/15   [provider]  colchicine 0.6 MG tablet Take 0.6 mg by mouth daily.    [provider]  ergocalciferol (VITAMIN D2) 1.25 MG (50000 UT) capsule ergocalciferol (vitamin D2) 1,250 mcg (50,000 unit) capsule  TAKE 1 CAPSULE BY MOUTH WEEKLY FOR 8 WEEKS    [provider]  escitalopram (LEXAPRO) 20 MG tablet Take 10 mg by mouth daily. 02/09/21   [provider]  ferrous gluconate (FERGON) 324 MG tablet Take 1 tablet (324 mg total) by mouth daily with breakfast. 07/21/21   Calvert Cantor, MD  losartan (COZAAR) 100 MG tablet Take 100 mg by mouth daily. 01/13/21   [provider]  meloxicam (MOBIC) 7.5 MG tablet Take 7.5 mg by mouth daily. 06/16/21   [provider]  metFORMIN (GLUCOPHAGE) 500 MG tablet  Take 2 tablets by mouth 2 (two) times daily. 02/09/21   [provider]  pantoprazole (PROTONIX) 40 MG tablet Take 40 mg by mouth daily. 03/07/17   [provider]  torsemide (DEMADEX) 20 MG tablet Take 20 mg by mouth daily. 06/07/21   [provider]    Physical Exam   Triage Vital Signs: ED Triage Vitals [12/10/21 2130]  Enc Vitals Group     BP (!) 169/77     Pulse Rate 87     Resp 16     Temp 98.2 F (36.8 C)     Temp Source Oral     SpO2 97 %     Weight 219 lb (99.3 kg)     Height 5\' 2"  (1.575 m)     Head Circumference      Peak Flow      Pain Score 2     Pain Loc       Pain Edu?      Excl. in GC?     Most recent vital signs: Vitals:   12/10/21 2130 12/11/21 0000  BP: (!) 169/77 135/78  Pulse: 87 83  Resp: 16 20  Temp: 98.2 F (36.8 C)   SpO2: 97% 96%    CONSTITUTIONAL: Alert and oriented and responds appropriately to questions. Well-appearing; well-nourished HEAD: Normocephalic, atraumatic EYES: Conjunctivae clear, pupils appear equal, sclera nonicteric ENT: normal nose; moist mucous membranes NECK: Supple, normal ROM CARD: RRR; S1 and S2 appreciated; no murmurs, no clicks, no rubs, no gallops CHEST:  Chest wall is nontender to palpation.  No crepitus, ecchymosis, erythema, warmth, rash or other lesions present.   RESP: Normal chest excursion without splinting or tachypnea; breath sounds clear and equal bilaterally; no wheezes, no rhonchi, no rales, no hypoxia or respiratory distress, speaking full sentences ABD/GI: Normal bowel sounds; non-distended; soft, non-tender, no rebound, no guarding, no peritoneal signs BACK: The back appears normal, no midline spinal tenderness or step-off or deformity.  She has some tenderness over the left posterior trapezius muscle without redness, warmth, ecchymosis, soft tissue swelling. EXT: Normal ROM in all joints; no deformity noted, no edema; no cyanosis, no calf tenderness or calf swelling SKIN: Normal color for age and race; warm; no rash on exposed skin NEURO: Moves all extremities equally, normal speech PSYCH: The patient's mood and manner are appropriate.   ED Results / Procedures / Treatments   LABS: (all labs ordered are listed, but only abnormal results are displayed) Labs Reviewed  BASIC METABOLIC PANEL - Abnormal; Notable for the following components:      Result Value   Glucose, Bld 185 (*)    All other components within normal limits  CBC - Abnormal; Notable for the following components:   MCV 75.0 (*)    MCH 24.1 (*)    RDW 15.7 (*)    All other components within normal limits   TROPONIN I (HIGH SENSITIVITY)  TROPONIN I (HIGH SENSITIVITY)     EKG:  EKG Interpretation  Date/Time:  Sunday December 10 2021 21:25:25 EST Ventricular Rate:  90 PR Interval:  188 QRS Duration: 126 QT Interval:  390 QTC Calculation: 477 R Axis:   2 Text Interpretation: Sinus rhythm with occasional Premature ventricular complexes Left bundle branch block Abnormal ECG When compared with ECG of 18-Jul-2021 23:43, PREVIOUS ECG IS PRESENT No significant change since last tracing Confirmed by 20-Jul-2021 [phone]) on 12/10/2021 11:35:18 PM         RADIOLOGY: My personal review and  interpretation of imaging: Chest x-ray clear.  I have personally reviewed all radiology reports.   DG Chest 2 View  Result Date: 12/10/2021 CLINICAL DATA:  Chest pain on the left EXAM: CHEST - 2 VIEW COMPARISON:  07/19/2021 FINDINGS: Cardiac shadow is within normal limits. Aortic calcifications are noted. The lungs are well aerated bilaterally without focal infiltrate or effusion in. Degenerative changes of the thoracic spine are seen. IMPRESSION: No active cardiopulmonary disease. Electronically Signed   By: Alcide Clever M.D.   On: 12/10/2021 22:01     PROCEDURES:  Critical Care performed: No   CRITICAL CARE Performed by: Baxter Hire Sukhraj Esquivias   Total critical care time: 0 minutes  Critical care time was exclusive of separately billable procedures and treating other patients.  Critical care was necessary to treat or prevent imminent or life-threatening deterioration.  Critical care was time spent personally by me on the following activities: development of treatment plan with patient and/or surrogate as well as nursing, discussions with consultants, evaluation of patient's response to treatment, examination of patient, obtaining history from patient or surrogate, ordering and performing treatments and interventions, ordering and review of laboratory studies, ordering and review of radiographic studies,  pulse oximetry and re-evaluation of patient's condition.   Marland Kitchen1-3 Lead EKG Interpretation Performed by: Kameelah Minish, Layla Maw, DO Authorized by: Easten Maceachern, Layla Maw, DO     Interpretation: normal     ECG rate:  80   ECG rate assessment: normal     Rhythm: sinus rhythm     Ectopy: none     Conduction: normal      IMPRESSION / MDM / ASSESSMENT AND PLAN / ED COURSE  I reviewed the triage vital signs and the nursing notes.    Patient here with atypical chest pain that has resolved.  The patient is on the cardiac monitor to evaluate for evidence of arrhythmia and/or significant heart rate changes.   DIFFERENTIAL DIAGNOSIS (includes but not limited to):   Musculoskeletal pain, costochondritis, muscle strain, less likely ACS, PE, dissection, pneumothorax, pneumonia, CHF   PLAN: We will obtain CBC, BMP, troponin x2, chest x-ray, EKG.  Will give Tylenol for pain control.   MEDICATIONS GIVEN IN ED: Medications  acetaminophen (TYLENOL) tablet 1,000 mg (1,000 mg Oral Given 12/11/21 0009)     ED COURSE: Patient's labs are reassuring.  No leukocytosis.  Normal hemoglobin.  Normal electrolytes and renal function.  Troponin x2 negative.  Chest x-ray images reviewed by myself and radiology and showed no infiltrate, edema or pneumothorax.  EKG is nonischemic.  She does have risk factors for ACS however she is well-appearing, asymptomatic with a reassuring work-up with very atypical symptoms today.  She does have a history of CHF with EF of 24% on recent stress test but she does not look volume overloaded today and chest x-ray is clear.  I feel she is safe for discharge.  She is comfortable with this plan.  Discussed using Tylenol at home for pain control.  I do not feel she needs a prescription for narcotic pain medication given symptoms only last for a few seconds at a time and I feel the risk of narcotics in a 71 year old outweigh the benefit.  She is comfortable with this plan   At this time, I do not  feel there is any life-threatening condition present. I reviewed all nursing notes, vitals, pertinent previous records.  All lab and urine results, EKGs, imaging ordered have been independently reviewed and interpreted by myself.  I reviewed all  available radiology reports from any imaging ordered this visit.  Based on my assessment, I feel the patient is safe to be discharged home without further emergent workup and can continue workup as an outpatient as needed. Discussed all findings, treatment plan as well as usual and customary return precautions with patient and husband.  They verbalize understanding and are comfortable with this plan.  Outpatient follow-up has been provided as needed.  All questions have been answered.    CONSULTS: No admission needed at this time given patient asymptomatic with reassuring cardiac work-up.   OUTSIDE RECORDS REVIEWED: Reviewed patient's most recent stress test and echocardiogram.  12/06/21 Stress test:    There is no evidence for significant ischemia. The study is low risk.   There is significant diaphragmatic attenuations, fixed apical defect appears artifactual.   Systolic function appears normal visually. Calculated LVEF 24%. Correlation with echo advised.   Mild LAD calcifications noted.   Echo 06/23/19 Echo:  IMPRESSIONS     1. The left ventricle has low normal systolic function, with an ejection  fraction of 50-55%. The cavity size was mildly dilated. Left ventricular  diastolic Doppler parameters are consistent with impaired relaxation.   2. The right ventricle has normal systolic function. The cavity was  normal. There is no increase in right ventricular wall thickness.   3. The aortic valve was not well visualized. Aortic valve regurgitation  is trivial by color flow Doppler.   4. The aorta is not well visualized unless otherwise noted.   5. The interatrial septum was not assessed.         FINAL CLINICAL IMPRESSION(S) / ED DIAGNOSES    Final diagnoses:  Atypical chest pain     Rx / DC Orders   ED Discharge Orders     None        Note:  This document was prepared using Dragon voice recognition software and may include unintentional dictation errors.   Jamal Haskin, Layla Maw, DO 12/11/21 [phone]

## 2021-12-11 NOTE — ED Notes (Signed)
Discharge instructions and follow-up  reviewed with patient. Patient verbalized understanding. Patient ambulated out to the waiting room with a steady gait/

## 2021-12-11 NOTE — Discharge Instructions (Addendum)
Your cardiac work-up today was very reassuring.  I feel you are safe to take Tylenol 1000 mg every 6 hours as needed for pain at home.  If you have worsening chest discomfort that is more persistent or associated with shortness of breath, dizziness, sudden sweating, swelling in your legs, feel like you are going to pass out, please return to the emergency room.

## 2021-12-20 ENCOUNTER — Ambulatory Visit (INDEPENDENT_AMBULATORY_CARE_PROVIDER_SITE_OTHER): Payer: Medicare Other

## 2021-12-20 ENCOUNTER — Other Ambulatory Visit: Payer: Self-pay

## 2021-12-20 DIAGNOSIS — R072 Precordial pain: Secondary | ICD-10-CM | POA: Diagnosis not present

## 2021-12-20 LAB — ECHOCARDIOGRAM COMPLETE
AR max vel: 1.7 cm2
AV Area VTI: 1.59 cm2
AV Area mean vel: 1.52 cm2
AV Mean grad: 5 mmHg
AV Peak grad: 8.6 mmHg
AV Vena cont: 0.5 cm
Ao pk vel: 1.47 m/s
Area-P 1/2: 4.89 cm2
Calc EF: 38.9 %
MV M vel: 5.32 m/s
MV Peak grad: 113.2 mmHg
MV VTI: 1.65 cm2
Radius: 0.4 cm
S' Lateral: 4.45 cm
Single Plane A2C EF: 45.1 %
Single Plane A4C EF: 30.5 %

## 2021-12-20 MED ORDER — PERFLUTREN LIPID MICROSPHERE
1.0000 mL | INTRAVENOUS | Status: AC | PRN
  Administered 2021-12-20: 2 mL via INTRAVENOUS

## 2021-12-25 ENCOUNTER — Telehealth: Payer: Self-pay

## 2021-12-25 MED ORDER — SPIRONOLACTONE 25 MG PO TABS: 25.0000 mg | ORAL_TABLET | Freq: Every day | ORAL | 3 refills | Status: DC

## 2021-12-25 NOTE — Telephone Encounter (Signed)
Attempted to call patient at phone number listed. It is a wrong number. The woman who answered stated that she keeps getting calls from our office and it is not the patients number. I called and left a VM for husbands number requesting patient call us back. I also called and spoke with patients sister who was listed in the chart and she stated that she would try and reach her to request that she call us back. ?

## 2021-12-25 NOTE — Telephone Encounter (Signed)
Patient returning call for results. Per note below updated demos with new number ? ?Patient will await callback .  ?

## 2021-12-25 NOTE — Telephone Encounter (Signed)
-----   Message from Debbe Odea, MD sent at 12/22/2021  8:21 AM EST ----- ?Echocardiogram shows moderately reduced ejection fraction.  Keep follow-up appointment, will consider left heart cath and also consider starting Entresto at follow-up visit after discussion with patient.  Stop Norvasc, start Aldactone 25 mg daily.  Continue other medications as prescribed. ?

## 2021-12-25 NOTE — Telephone Encounter (Signed)
Spoke with patient and gave her the Echo result note. Patient will stop taking her Amlodipine and start taking Aldactone. Patient also confirmed that she will be here for her follow up appointment on Fri. 12/29/21. ?

## 2021-12-29 ENCOUNTER — Ambulatory Visit (INDEPENDENT_AMBULATORY_CARE_PROVIDER_SITE_OTHER): Payer: Medicare Other | Admitting: Cardiology

## 2021-12-29 ENCOUNTER — Encounter: Payer: Self-pay | Admitting: Cardiology

## 2021-12-29 ENCOUNTER — Other Ambulatory Visit: Payer: Self-pay

## 2021-12-29 VITALS — BP 136/70 | HR 91 | Ht 62.0 in | Wt 226.0 lb

## 2021-12-29 DIAGNOSIS — I2584 Coronary atherosclerosis due to calcified coronary lesion: Secondary | ICD-10-CM

## 2021-12-29 DIAGNOSIS — I2510 Atherosclerotic heart disease of native coronary artery without angina pectoris: Secondary | ICD-10-CM

## 2021-12-29 DIAGNOSIS — I429 Cardiomyopathy, unspecified: Secondary | ICD-10-CM

## 2021-12-29 DIAGNOSIS — I10 Essential (primary) hypertension: Secondary | ICD-10-CM

## 2021-12-29 MED ORDER — ENTRESTO 49-51 MG PO TABS: 1.0000 | ORAL_TABLET | Freq: Two times a day (BID) | ORAL | 5 refills | Status: DC

## 2021-12-29 MED ORDER — EZETIMIBE 10 MG PO TABS: 10.0000 mg | ORAL_TABLET | Freq: Every day | ORAL | 3 refills | Status: DC

## 2021-12-29 NOTE — Patient Instructions (Addendum)
Medication Instructions:  ? ?Your physician has recommended you make the following change in your medication:  ? ? STOP taking Losartan. ? ?2.    START taking Entresto 49-51 MG twice a day. ? ?3.    START taking Zetia 10 MG once a day. ? ? ?*If you need a refill on your cardiac medications before your next appointment, please call your pharmacy* ? ? ?Lab Work: ? ?Fasting Lipid drawn in clinic ? ?If you have labs (blood work) drawn today and your tests are completely normal, you will receive your results only by: ?MyChart Message (if you have MyChart) OR ?A paper copy in the mail ?If you have any lab test that is abnormal or we need to change your treatment, we will call you to review the results. ? ? ?Testing/Procedures: ? ? ?You are scheduled for a Cardiac Catheterization on Monday, March 13 with Dr. Eldridge Dace. ? ?1. Please [facility] at 8:30 AM (This time is one hours before your procedure to ensure your preparation). Free valet parking service is available.  ? ?Special note: Every effort is made to have your procedure done on time. Please understand that emergencies sometimes delay scheduled procedures. ? ?2. Diet: Do not eat solid foods after midnight.  You may have clear liquids until 5 AM upon the day of the procedure. ? ?3. Labs: Already drawn on 12/10/21 ? ?4. Medication instructions in preparation for your procedure: ? ? Contrast Allergy: No ? ?Do not take Torsemide the morning of your procedure Monday 01/01/22 ? ?Do not take Diabetes Med Glucophage (Metformin) on the day of the procedure and HOLD 48 HOURS AFTER THE PROCEDURE. ? ?On the morning of your procedure, take Aspirin and any morning medicines NOT listed above.  You may use sips of water. ? ?5. Plan to go home the same day, you will only stay overnight if medically necessary. ?6. You MUST have a responsible adult to drive you home. ?7. An adult MUST be with you the first 24 hours after you arrive home. ?8. Bring a current list of  your medications, and the last time and date medication taken. ?9. Bring ID and current insurance cards. ?10.Please wear clothes that are easy to get on and off and wear slip-on shoes. ? ?Thank you for allowing [place] to care for you! ?  -- [facility] ? ? ? ?Follow-Up: ?At [facility], you and your health needs are our priority.  As part of our continuing mission to provide you with exceptional heart care, we have created designated Provider Care Teams.  These Care Teams include your primary Cardiologist (physician) and Advanced Practice Providers (APPs -  Physician Assistants and Nurse Practitioners) who all work together to provide you with the care you need, when you need it. ? ?We recommend signing up for the patient portal called "MyChart".  Sign up information is provided on this After Visit Summary.  MyChart is used to connect with patients for Virtual Visits (Telemedicine).  Patients are able to view lab/test results, encounter notes, upcoming appointments, etc.  Non-urgent messages can be sent to your provider as well.   ?To learn more about what you can do with MyChart, go to [email or web address].   ? ?Your next appointment:   ?1 month(s) ? ?The format for your next appointment:   ?In Person ? ?Provider:   ?Debbe Odea, MD  ? ? ?Other Instructions ? ? ?

## 2021-12-29 NOTE — H&P (View-Only) (Signed)
Cardiology Office Note:    Date:  12/29/2021   ID:  Betty Luna, DOB Sep 23, 1951, MRN [id number]  PCP:  Josephine Cables, MD   [place] [facility] Cardiologist:  Kate Sable, MD     Referring MD: Josephine Cables, MD   Chief Complaint  Patient presents with   Other    Follow up post Testing. Patient c.o Chest pain and Right leg swelling. Meds reviewed verbally with patient.     History of Present Illness:    Betty Luna is a 71 y.o. female with a hx of hypertension, hyperlipidemia who presents for follow-up.  Previously seen due to chest pain.    Echocardiogram and Lexiscan Myoview were ordered to evaluate cardiac function.  Echo 12/2021 showed EF 35 to 40% Lexiscan Myoview 12/06/2021 no significant ischemia, mild LAD calcifications, EF 24%  Reduced ejection fraction noted on echo, patient started on Aldactone 25 mg daily, Coreg was continued.  Currently takes losartan 100 mg daily.  Lipitor recently stopped due to arthralgias.  Denies edema, still has occasional chest pain.  Previous notes She states being under a lot of stress lately, recently lost both her daughter and stepdaughter recently husband likely has Alzheimer's.   Echo 06/2021 EF 50 to 55%.  Past Medical History:  Diagnosis Date   Anemia    Arthritis    CHF (congestive heart failure) ([place])    Depression    Diabetes mellitus without complication (HCC)    GERD (gastroesophageal reflux disease)    Gout    Hypertension     Past Surgical History:  Procedure Laterality Date   ABDOMINAL HYSTERECTOMY     CATARACT EXTRACTION W/PHACO Right 04/16/2017   Procedure: CATARACT EXTRACTION PHACO AND INTRAOCULAR LENS PLACEMENT ([place]);  Surgeon: Birder Robson, MD;  Location: [facility] ORS;  Service: Ophthalmology;  Laterality: Right;  [place] 01:06 AP% 16.5 CDE 11.03 Fluid pack lot # [id number]   CATARACT EXTRACTION W/PHACO Left 10/01/2017   Procedure: CATARACT EXTRACTION PHACO AND INTRAOCULAR LENS PLACEMENT  (IOC)-LEFT DIABETIC;  Surgeon: Birder Robson, MD;  Location: [facility] ORS;  Service: Ophthalmology;  Laterality: Left;  [place] 01:05.7 AP% 12.2 CDE 7.42 Fluid pack lot # [id number]   CTR Left    TONSILLECTOMY      Current Medications: Current Meds  Medication Sig   aspirin EC 81 MG tablet Take 81 mg by mouth daily.   benzonatate (TESSALON PERLES) 100 MG capsule Take 1 capsule (100 mg total) by mouth 3 (three) times daily as needed for cough.   carvedilol (COREG) 25 MG tablet Take 25 mg by mouth 2 (two) times daily.    colchicine 0.6 MG tablet Take 0.6 mg by mouth daily.   ergocalciferol (VITAMIN D2) 1.25 MG (50000 UT) capsule ergocalciferol (vitamin D2) 1,250 mcg (50,000 unit) capsule  TAKE 1 CAPSULE BY MOUTH WEEKLY FOR 8 WEEKS   escitalopram (LEXAPRO) 20 MG tablet Take 10 mg by mouth daily.   ezetimibe (ZETIA) 10 MG tablet Take 1 tablet (10 mg total) by mouth daily.   ferrous gluconate (FERGON) 324 MG tablet Take 1 tablet (324 mg total) by mouth daily with breakfast.   meloxicam (MOBIC) 7.5 MG tablet Take 7.5 mg by mouth daily.   metFORMIN (GLUCOPHAGE) 500 MG tablet Take 2 tablets by mouth 2 (two) times daily.   pantoprazole (PROTONIX) 40 MG tablet Take 40 mg by mouth daily.   sacubitril-valsartan (ENTRESTO) 49-51 MG Take 1 tablet by mouth 2 (two) times daily.   spironolactone (ALDACTONE) 25 MG tablet Take 1 tablet (  25 mg total) by mouth daily. STOP taking your Amlodipine   torsemide (DEMADEX) 20 MG tablet Take 20 mg by mouth daily.   [DISCONTINUED] losartan (COZAAR) 100 MG tablet Take 100 mg by mouth daily.     Allergies:   Omeprazole, Tramadol, Atorvastatin, and Codeine   Social History   Socioeconomic History   Marital status: Married    Spouse name: Not on file   Number of children: Not on file   Years of education: Not on file   Highest education level: Not on file  Occupational History   Not on file  Tobacco Use   Smoking status: Former   Smokeless tobacco: Current     Types: Snuff  Vaping Use   Vaping Use: Never used  Substance and Sexual Activity   Alcohol use: Yes    Comment: occasonally   Drug use: No   Sexual activity: Not on file  Other Topics Concern   Not on file  Social History Narrative   Not on file   Social Determinants of Health   Financial Resource Strain: Not on file  Food Insecurity: Not on file  Transportation Needs: Not on file  Physical Activity: Not on file  Stress: Not on file  Social Connections: Not on file     Family History: The patient's family history includes Alcohol abuse in her brother; Breast cancer in her sister.  ROS:   Please see the history of present illness.     All other systems reviewed and are negative.  EKGs/Labs/Other Studies Reviewed:    The following studies were reviewed today:   EKG:  EKG is  ordered today.  The ekg ordered today demonstrates sinus rhythm, left bundle branch block, PVCs  Recent Labs: 07/20/2021: ALT 30; Magnesium 2.0 12/10/2021: BUN 19; Creatinine, Ser 0.79; Hemoglobin 12.1; Platelets 239; Potassium 3.7; Sodium 138  Recent Lipid Panel No results found for: CHOL, TRIG, HDL, CHOLHDL, VLDL, LDLCALC, LDLDIRECT   Risk Assessment/Calculations:          Physical Exam:    VS:  BP 136/70 (BP Location: Left Arm, Patient Position: Sitting, Cuff Size: Normal)    Pulse 91    Ht 5\' 2"  (1.575 m)    Wt 226 lb (102.5 kg)    SpO2 97%    BMI 41.34 kg/m     Wt Readings from Last 3 Encounters:  12/29/21 226 lb (102.5 kg)  12/10/21 219 lb (99.3 kg)  11/27/21 216 lb (98 kg)     GEN:  Well nourished, well developed in no acute distress HEENT: Normal NECK: No JVD; No carotid bruits LYMPHATICS: No lymphadenopathy CARDIAC: RRR, no murmurs, rubs, gallops RESPIRATORY:  Clear to auscultation without rales, wheezing or rhonchi  ABDOMEN: Soft, non-tender, non-distended MUSCULOSKELETAL:  No edema; No deformity  SKIN: Warm and dry NEUROLOGIC:  Alert and oriented x 3 PSYCHIATRIC:   Normal affect   ASSESSMENT:    1. Cardiomyopathy, unspecified type ([place])   2. Primary hypertension   3. Morbid obesity ([place])   4. Coronary artery calcification     PLAN:    In order of problems listed above:  Cardiomyopathy EF 35 to 40%.  Appears euvolemic.  NYHA class III symptoms.  Lexiscan with no significant ischemia.  Schedule left heart cath.  Start Entresto 47-51 mg twice daily.  Continue Coreg, Aldactone 25 mg daily.  Continue torsemide 20 mg daily. Hypertension, BP controlled.  Coreg, Entresto, Aldactone as above. Obesity, low-calorie diet, weight loss advised. Coronary calcification, aspirin 81  mg, start Zetia.  Patient not tolerant to statins.  Obtain lipid panel.     Follow-up after cardiac cath.  Shared Decision Making/Informed Consent The risks [stroke (1 in 1000), death (1 in 1000), kidney failure [usually temporary] (1 in 500), bleeding (1 in 200), allergic reaction [possibly serious] (1 in 200)], benefits (diagnostic support and management of coronary artery disease) and alternatives of a cardiac catheterization were discussed in detail with Betty Luna and she is willing to proceed.      Medication Adjustments/Labs and Tests Ordered: Current medicines are reviewed at length with the patient today.  Concerns regarding medicines are outlined above.  Orders Placed This Encounter  Procedures   Lipid panel   EKG 12-Lead   Meds ordered this encounter  Medications   sacubitril-valsartan (ENTRESTO) 49-51 MG    Sig: Take 1 tablet by mouth 2 (two) times daily.    Dispense:  60 tablet    Refill:  5   ezetimibe (ZETIA) 10 MG tablet    Sig: Take 1 tablet (10 mg total) by mouth daily.    Dispense:  90 tablet    Refill:  3    Patient Instructions  Medication Instructions:   Your physician has recommended you make the following change in your medication:    STOP taking Losartan.  2.    START taking Entresto 49-51 MG twice a day.  3.    START taking Zetia 10  MG once a day.   *If you need a refill on your cardiac medications before your next appointment, please call your pharmacy*   Lab Work:  Fasting Lipid drawn in clinic  If you have labs (blood work) drawn today and your tests are completely normal, you will receive your results only by: [place] (if you have MyChart) OR A paper copy in the mail If you have any lab test that is abnormal or we need to change your treatment, we will call you to review the results.   Testing/Procedures:   You are scheduled for a Cardiac Catheterization on Monday, March 13 with Dr. Marlyne Beards.  1. Please arrive at the [facility] at 8:30 AM (This time is one hours before your procedure to ensure your preparation). Free valet parking service is available.   Special note: Every effort is made to have your procedure done on time. Please understand that emergencies sometimes delay scheduled procedures.  2. Diet: Do not eat solid foods after midnight.  You may have clear liquids until 5 AM upon the day of the procedure.  3. Labs: Already drawn on 12/10/21  4. Medication instructions in preparation for your procedure:   Contrast Allergy: No  Do not take Torsemide the morning of your procedure Monday 01/01/22  Do not take Diabetes Med Glucophage (Metformin) on the day of the procedure and HOLD 48 HOURS AFTER THE PROCEDURE.  On the morning of your procedure, take Aspirin and any morning medicines NOT listed above.  You may use sips of water.  5. Plan to go home the same day, you will only stay overnight if medically necessary. 6. You MUST have a responsible adult to drive you home. 7. An adult MUST be with you the first 24 hours after you arrive home. 8. Bring a current list of your medications, and the last time and date medication taken. 9. Bring ID and current insurance cards. 10.Please wear clothes that are easy to get on and off and wear slip-on shoes.  Thank you for allowing  [place] to care  for you!   -- [facility]    Follow-Up: At [facility], you and your health needs are our priority.  As part of our continuing mission to provide you with exceptional heart care, we have created designated Provider Care Teams.  These Care Teams include your primary Cardiologist (physician) and Advanced Practice Providers (APPs -  Physician Assistants and Nurse Practitioners) who all work together to provide you with the care you need, when you need it.  We recommend signing up for the patient portal called "MyChart".  Sign up information is provided on this After Visit Summary.  MyChart is used to connect with patients for Virtual Visits (Telemedicine).  Patients are able to view lab/test results, encounter notes, upcoming appointments, etc.  Non-urgent messages can be sent to your provider as well.   To learn more about what you can do with MyChart, go to [email or web address].    Your next appointment:   1 month(s)  The format for your next appointment:   In Person  Provider:   Kate Sable, MD    Other Instructions     Signed, Kate Sable, MD  12/29/2021 1:12 PM    [place]

## 2021-12-29 NOTE — Progress Notes (Signed)
°Cardiology Office Note:   ° °Date:  12/29/2021  ° °ID:  Betty Luna, DOB 10/11/1951, MRN [id number] ° °PCP:  Roberts, Caroline, MD °  °[facility] °Cardiologist:  Neziah Vogelgesang Agbor-Etang, MD    ° °Referring MD: Roberts, Caroline, MD  ° °Chief Complaint  °Patient presents with  ° Other  °  Follow up post Testing. Patient c.o Chest pain and Right leg swelling. Meds reviewed verbally with patient.   ° ° °History of Present Illness:   ° °Betty Luna is a 71 y.o. female with a hx of hypertension, hyperlipidemia who presents for follow-up.  Previously seen due to chest pain.   ° °Echocardiogram and Lexiscan Myoview were ordered to evaluate cardiac function. ° °Echo 12/2021 showed EF 35 to 40% °Lexiscan Myoview 12/06/2021 no significant ischemia, mild LAD calcifications, EF 24% ° °Reduced ejection fraction noted on echo, patient started on Aldactone 25 mg daily, Coreg was continued.  Currently takes losartan 100 mg daily.  Lipitor recently stopped due to arthralgias.  Denies edema, still has occasional chest pain. ° °Previous notes °She states being under a lot of stress lately, recently lost both her daughter and stepdaughter recently husband likely has Alzheimer's.  ° °Echo 06/2021 EF 50 to 55%. ° °Past Medical History:  °Diagnosis Date  ° Anemia   ° Arthritis   ° CHF (congestive heart failure) (HCC)   ° Depression   ° Diabetes mellitus without complication (HCC)   ° GERD (gastroesophageal reflux disease)   ° Gout   ° Hypertension   ° ° °Past Surgical History:  °Procedure Laterality Date  ° ABDOMINAL HYSTERECTOMY    ° CATARACT EXTRACTION W/PHACO Right 04/16/2017  ° Procedure: CATARACT EXTRACTION PHACO AND INTRAOCULAR LENS PLACEMENT ([facility]);  Surgeon: Porfilio, William, MD;  Location: [facility] ORS;  Service: Ophthalmology;  Laterality: Right;  [place] 01:06 °AP% 16.5 °CDE 11.03 °Fluid pack lot # [id number]  ° CATARACT EXTRACTION W/PHACO Left 10/01/2017  ° Procedure: CATARACT EXTRACTION PHACO AND INTRAOCULAR LENS PLACEMENT  ([facility])-LEFT DIABETIC;  Surgeon: Porfilio, William, MD;  Location: [facility] ORS;  Service: Ophthalmology;  Laterality: Left;  US 01:05.7 °AP% 12.2 °CDE 7.42 °Fluid pack lot # [id number]  ° CTR Left   ° TONSILLECTOMY    ° ° °Current Medications: °Current Meds  °Medication Sig  ° aspirin EC 81 MG tablet Take 81 mg by mouth daily.  ° benzonatate (TESSALON PERLES) 100 MG capsule Take 1 capsule (100 mg total) by mouth 3 (three) times daily as needed for cough.  ° carvedilol (COREG) 25 MG tablet Take 25 mg by mouth 2 (two) times daily.   ° colchicine 0.6 MG tablet Take 0.6 mg by mouth daily.  ° ergocalciferol (VITAMIN D2) 1.25 MG (50000 UT) capsule ergocalciferol (vitamin D2) 1,250 mcg (50,000 unit) capsule ° TAKE 1 CAPSULE BY MOUTH WEEKLY FOR 8 WEEKS  ° escitalopram (LEXAPRO) 20 MG tablet Take 10 mg by mouth daily.  ° ezetimibe (ZETIA) 10 MG tablet Take 1 tablet (10 mg total) by mouth daily.  ° ferrous gluconate (FERGON) 324 MG tablet Take 1 tablet (324 mg total) by mouth daily with breakfast.  ° meloxicam (MOBIC) 7.5 MG tablet Take 7.5 mg by mouth daily.  ° metFORMIN (GLUCOPHAGE) 500 MG tablet Take 2 tablets by mouth 2 (two) times daily.  ° pantoprazole (PROTONIX) 40 MG tablet Take 40 mg by mouth daily.  ° sacubitril-valsartan (ENTRESTO) 49-51 MG Take 1 tablet by mouth 2 (two) times daily.  ° spironolactone (ALDACTONE) 25 MG tablet Take 1 tablet (  25 mg total) by mouth daily. STOP taking your Amlodipine  ° torsemide (DEMADEX) 20 MG tablet Take 20 mg by mouth daily.  ° [DISCONTINUED] losartan (COZAAR) 100 MG tablet Take 100 mg by mouth daily.  °  ° °Allergies:   Omeprazole, Tramadol, Atorvastatin, and Codeine  ° °Social History  ° °Socioeconomic History  ° Marital status: Married  °  Spouse name: Not on file  ° Number of children: Not on file  ° Years of education: Not on file  ° Highest education level: Not on file  °Occupational History  ° Not on file  °Tobacco Use  ° Smoking status: Former  ° Smokeless tobacco: Current  °   Types: Snuff  °Vaping Use  ° Vaping Use: Never used  °Substance and Sexual Activity  ° Alcohol use: Yes  °  Comment: occasonally  ° Drug use: No  ° Sexual activity: Not on file  °Other Topics Concern  ° Not on file  °Social History Narrative  ° Not on file  ° °Social Determinants of Health  ° °Financial Resource Strain: Not on file  °Food Insecurity: Not on file  °Transportation Needs: Not on file  °Physical Activity: Not on file  °Stress: Not on file  °Social Connections: Not on file  °  ° °Family History: °The patient's family history includes Alcohol abuse in her brother; Breast cancer in her sister. ° °ROS:   °Please see the history of present illness.    ° All other systems reviewed and are negative. ° °EKGs/Labs/Other Studies Reviewed:   ° °The following studies were reviewed today: ° ° °EKG:  EKG is  ordered today.  The ekg ordered today demonstrates sinus rhythm, left bundle branch block, PVCs ° °Recent Labs: °07/20/2021: ALT 30; Magnesium 2.0 °12/10/2021: BUN 19; Creatinine, Ser 0.79; Hemoglobin 12.1; Platelets 239; Potassium 3.7; Sodium 138  °Recent Lipid Panel °No results found for: CHOL, TRIG, HDL, CHOLHDL, VLDL, LDLCALC, LDLDIRECT ° ° °Risk Assessment/Calculations:   ° ° °    ° °Physical Exam:   ° °VS:  BP 136/70 (BP Location: Left Arm, Patient Position: Sitting, Cuff Size: Normal)    Pulse 91    Ht 5' 2" (1.575 m)    Wt 226 lb (102.5 kg)    SpO2 97%    BMI 41.34 kg/m²    ° °Wt Readings from Last 3 Encounters:  °12/29/21 226 lb (102.5 kg)  °12/10/21 219 lb (99.3 kg)  °11/27/21 216 lb (98 kg)  °  ° °GEN:  Well nourished, well developed in no acute distress °HEENT: Normal °NECK: No JVD; No carotid bruits °LYMPHATICS: No lymphadenopathy °CARDIAC: RRR, no murmurs, rubs, gallops °RESPIRATORY:  Clear to auscultation without rales, wheezing or rhonchi  °ABDOMEN: Soft, non-tender, non-distended °MUSCULOSKELETAL:  No edema; No deformity  °SKIN: Warm and dry °NEUROLOGIC:  Alert and oriented x 3 °PSYCHIATRIC:   Normal affect  ° °ASSESSMENT:   ° °1. Cardiomyopathy, unspecified type (HCC)   °2. Primary hypertension   °3. Morbid obesity (HCC)   °4. Coronary artery calcification   ° ° °PLAN:   ° °In order of problems listed above: ° °Cardiomyopathy EF 35 to 40%.  Appears euvolemic.  NYHA class III symptoms.  Lexiscan with no significant ischemia.  Schedule left heart cath.  Start Entresto 47-51 mg twice daily.  Continue Coreg, Aldactone 25 mg daily.  Continue torsemide 20 mg daily. °Hypertension, BP controlled.  Coreg, Entresto, Aldactone as above. °Obesity, low-calorie diet, weight loss advised. °Coronary calcification, aspirin 81   mg, start Zetia.  Patient not tolerant to statins.  Obtain lipid panel. ° °   °Follow-up after cardiac cath. ° °Shared Decision Making/Informed Consent °The risks [stroke (1 in 1000), death (1 in 1000), kidney failure [usually temporary] (1 in 500), bleeding (1 in 200), allergic reaction [possibly serious] (1 in 200)], benefits (diagnostic support and management of coronary artery disease) and alternatives of a cardiac catheterization were discussed in detail with Ms. Novell and she is willing to proceed.  ° ° ° ° °Medication Adjustments/Labs and Tests Ordered: °Current medicines are reviewed at length with the patient today.  Concerns regarding medicines are outlined above.  °Orders Placed This Encounter  °Procedures  ° Lipid panel  ° EKG 12-Lead  ° °Meds ordered this encounter  °Medications  ° sacubitril-valsartan (ENTRESTO) 49-51 MG  °  Sig: Take 1 tablet by mouth 2 (two) times daily.  °  Dispense:  60 tablet  °  Refill:  5  ° ezetimibe (ZETIA) 10 MG tablet  °  Sig: Take 1 tablet (10 mg total) by mouth daily.  °  Dispense:  90 tablet  °  Refill:  3  ° ° °Patient Instructions  °Medication Instructions:  ° °Your physician has recommended you make the following change in your medication:  ° ° STOP taking Losartan. ° °2.    START taking Entresto 49-51 MG twice a day. ° °3.    START taking Zetia 10  MG once a day. ° ° °*If you need a refill on your cardiac medications before your next appointment, please call your pharmacy* ° ° °Lab Work: ° °Fasting Lipid drawn in clinic ° °If you have labs (blood work) drawn today and your tests are completely normal, you will receive your results only by: °MyChart Message (if you have MyChart) OR °A paper copy in the mail °If you have any lab test that is abnormal or we need to change your treatment, we will call you to review the results. ° ° °Testing/Procedures: ° ° °You are scheduled for a Cardiac Catheterization on Monday, March 13 with Dr. Muhammed Arida. ° °1. [facility]30 AM (This time is one hours before your procedure to ensure your preparation). Free valet parking service is available.  ° °Special note: Every effort is made to have your procedure done on time. Please understand that emergencies sometimes delay scheduled procedures. ° °2. Diet: Do not eat solid foods after midnight.  You may have clear liquids until 5 AM upon the day of the procedure. ° °3. Labs: Already drawn on 12/10/21 ° °4. Medication instructions in preparation for your procedure: ° ° Contrast Allergy: No ° °Do not take Torsemide the morning of your procedure Monday 01/01/22 ° °Do not take Diabetes Med Glucophage (Metformin) on the day of the procedure and HOLD 48 HOURS AFTER THE PROCEDURE. ° °On the morning of your procedure, take Aspirin and any morning medicines NOT listed above.  You may use sips of water. ° °5. Plan to go home the same day, you will only stay overnight if medically necessary. °6. You MUST have a responsible adult to drive you home. °7. An adult MUST be with you the first 24 hours after you arrive home. °8. Bring a current list of your medications, and the last time and date medication taken. °9. Bring ID and current insurance cards. °10.Please wear clothes that are easy to get on and off and wear slip-on shoes. ° °Thank you for allowing   us to care  for you! °  -- [facility] ° ° ° °Follow-Up: °At [facility], you and your health needs are our priority.  As part of our continuing mission to provide you with exceptional heart care, we have created designated Provider Care Teams.  These Care Teams include your primary Cardiologist (physician) and Advanced Practice Providers (APPs -  Physician Assistants and Nurse Practitioners) who all work together to provide you with the care you need, when you need it. ° °We recommend signing up for the patient portal called "MyChart".  Sign up information is provided on this After Visit Summary.  MyChart is used to connect with patients for Virtual Visits (Telemedicine).  Patients are able to view lab/test results, encounter notes, upcoming appointments, etc.  Non-urgent messages can be sent to your provider as well.   °To learn more about what you can do with MyChart, go to [email or web address].   ° °Your next appointment:   °1 month(s) ° °The format for your next appointment:   °In Person ° °Provider:   °Deago Burruss Agbor-Etang, MD  ° ° °Other Instructions ° °  ° °Signed, °Aikeem Lilley Agbor-Etang, MD  °12/29/2021 1:12 PM    °[facility] °

## 2021-12-30 LAB — LIPID PANEL
Chol/HDL Ratio: 3.8 ratio (ref 0.0–4.4)
Cholesterol, Total: 203 mg/dL — ABNORMAL HIGH (ref 100–199)
HDL: 53 mg/dL (ref >39)
LDL Chol Calc (NIH): 126 mg/dL — ABNORMAL HIGH (ref 0–99)
Triglycerides: 133 mg/dL (ref 0–149)
VLDL Cholesterol Cal: 24 mg/dL (ref 5–40)

## 2022-01-01 ENCOUNTER — Encounter
Admission: RE | Disposition: A | Discharge status: Home / Self Care | Payer: Self-pay | Source: Home / Self Care | Attending: Cardiovascular Disease

## 2022-01-01 ENCOUNTER — Other Ambulatory Visit: Payer: Self-pay

## 2022-01-01 ENCOUNTER — Encounter: Payer: Self-pay | Admitting: Cardiovascular Disease

## 2022-01-01 ENCOUNTER — Ambulatory Visit
Admission: RE | Admit: 2022-01-01 | Discharge: 2022-01-01 | Disposition: A | Discharge status: Home / Self Care | Payer: Medicare Other | Attending: Cardiovascular Disease | Admitting: Cardiovascular Disease

## 2022-01-01 DIAGNOSIS — Z6841 Body Mass Index (BMI) 40.0 and over, adult: Secondary | ICD-10-CM | POA: Diagnosis not present

## 2022-01-01 DIAGNOSIS — I2510 Atherosclerotic heart disease of native coronary artery without angina pectoris: Secondary | ICD-10-CM

## 2022-01-01 DIAGNOSIS — M109 Gout, unspecified: Secondary | ICD-10-CM | POA: Diagnosis not present

## 2022-01-01 DIAGNOSIS — F17290 Nicotine dependence, other tobacco product, uncomplicated: Secondary | ICD-10-CM | POA: Insufficient documentation

## 2022-01-01 DIAGNOSIS — F32A Depression, unspecified: Secondary | ICD-10-CM | POA: Diagnosis not present

## 2022-01-01 DIAGNOSIS — E119 Type 2 diabetes mellitus without complications: Secondary | ICD-10-CM | POA: Diagnosis not present

## 2022-01-01 DIAGNOSIS — I509 Heart failure, unspecified: Secondary | ICD-10-CM | POA: Insufficient documentation

## 2022-01-01 DIAGNOSIS — I429 Cardiomyopathy, unspecified: Secondary | ICD-10-CM | POA: Diagnosis present

## 2022-01-01 DIAGNOSIS — Z7982 Long term (current) use of aspirin: Secondary | ICD-10-CM | POA: Diagnosis not present

## 2022-01-01 DIAGNOSIS — R079 Chest pain, unspecified: Secondary | ICD-10-CM | POA: Diagnosis not present

## 2022-01-01 DIAGNOSIS — I272 Pulmonary hypertension, unspecified: Secondary | ICD-10-CM | POA: Insufficient documentation

## 2022-01-01 DIAGNOSIS — Z79899 Other long term (current) drug therapy: Secondary | ICD-10-CM | POA: Diagnosis not present

## 2022-01-01 DIAGNOSIS — I2584 Coronary atherosclerosis due to calcified coronary lesion: Secondary | ICD-10-CM | POA: Diagnosis not present

## 2022-01-01 DIAGNOSIS — Z7984 Long term (current) use of oral hypoglycemic drugs: Secondary | ICD-10-CM | POA: Insufficient documentation

## 2022-01-01 DIAGNOSIS — K219 Gastro-esophageal reflux disease without esophagitis: Secondary | ICD-10-CM | POA: Insufficient documentation

## 2022-01-01 DIAGNOSIS — E785 Hyperlipidemia, unspecified: Secondary | ICD-10-CM | POA: Insufficient documentation

## 2022-01-01 DIAGNOSIS — I110 Hypertensive heart disease with heart failure: Secondary | ICD-10-CM | POA: Diagnosis not present

## 2022-01-01 DIAGNOSIS — I428 Other cardiomyopathies: Secondary | ICD-10-CM | POA: Diagnosis not present

## 2022-01-01 HISTORY — PX: RIGHT/LEFT HEART CATH AND CORONARY ANGIOGRAPHY: CATH118266

## 2022-01-01 SURGERY — RIGHT/LEFT HEART CATH AND CORONARY ANGIOGRAPHY
Anesthesia: Moderate Sedation | Laterality: Bilateral

## 2022-01-01 MED ORDER — HEPARIN (PORCINE) IN NACL 1000-0.9 UT/500ML-% IV SOLN
INTRAVENOUS | Status: AC
  Filled 2022-01-01: qty 1000

## 2022-01-01 MED ORDER — MIDAZOLAM HCL 2 MG/2ML IJ SOLN
INTRAMUSCULAR | Status: AC
  Filled 2022-01-01: qty 2

## 2022-01-01 MED ORDER — MIDAZOLAM HCL 2 MG/2ML IJ SOLN
INTRAMUSCULAR | Status: DC | PRN
Start: 2022-01-01 — End: 2022-01-01
  Administered 2022-01-01: 1 mg via INTRAVENOUS

## 2022-01-01 MED ORDER — FENTANYL CITRATE (PF) 100 MCG/2ML IJ SOLN
INTRAMUSCULAR | Status: AC
  Filled 2022-01-01: qty 2

## 2022-01-01 MED ORDER — VERAPAMIL HCL 2.5 MG/ML IV SOLN
INTRAVENOUS | Status: AC
  Filled 2022-01-01: qty 2

## 2022-01-01 MED ORDER — SODIUM CHLORIDE 0.9% FLUSH: 3.0000 mL | INTRAVENOUS | Status: DC | PRN

## 2022-01-01 MED ORDER — ONDANSETRON HCL 4 MG/2ML IJ SOLN: 4.0000 mg | Freq: Four times a day (QID) | INTRAMUSCULAR | Status: DC | PRN

## 2022-01-01 MED ORDER — LIDOCAINE HCL 1 % IJ SOLN
INTRAMUSCULAR | Status: AC
  Filled 2022-01-01: qty 20

## 2022-01-01 MED ORDER — SODIUM CHLORIDE 0.9 % IV SOLN: INTRAVENOUS | Status: DC

## 2022-01-01 MED ORDER — FENTANYL CITRATE (PF) 100 MCG/2ML IJ SOLN
INTRAMUSCULAR | Status: DC | PRN
  Administered 2022-01-01: 25 ug via INTRAVENOUS
  Administered 2022-01-01: 50 ug via INTRAVENOUS

## 2022-01-01 MED ORDER — ACETAMINOPHEN 325 MG PO TABS: 650.0000 mg | ORAL_TABLET | ORAL | Status: DC | PRN

## 2022-01-01 MED ORDER — SODIUM CHLORIDE 0.9 % IV SOLN: 250.0000 mL | INTRAVENOUS | Status: DC | PRN

## 2022-01-01 MED ORDER — SODIUM CHLORIDE 0.9% FLUSH: 3.0000 mL | Freq: Two times a day (BID) | INTRAVENOUS | Status: DC

## 2022-01-01 MED ORDER — LIDOCAINE HCL (PF) 1 % IJ SOLN
INTRAMUSCULAR | Status: DC | PRN
Start: 2022-01-01 — End: 2022-01-01
  Administered 2022-01-01 (×2): 2 mL

## 2022-01-01 MED ORDER — HEPARIN SODIUM (PORCINE) 1000 UNIT/ML IJ SOLN
INTRAMUSCULAR | Status: AC
  Filled 2022-01-01: qty 10

## 2022-01-01 MED ORDER — HEPARIN (PORCINE) IN NACL 1000-0.9 UT/500ML-% IV SOLN
INTRAVENOUS | Status: DC | PRN
  Administered 2022-01-01 (×2): 500 mL

## 2022-01-01 MED ORDER — VERAPAMIL HCL 2.5 MG/ML IV SOLN
INTRAVENOUS | Status: DC | PRN
  Administered 2022-01-01: 2.5 mg via INTRA_ARTERIAL

## 2022-01-01 MED ORDER — HEPARIN SODIUM (PORCINE) 1000 UNIT/ML IJ SOLN
INTRAMUSCULAR | Status: DC | PRN
  Administered 2022-01-01: 5000 [IU] via INTRAVENOUS

## 2022-01-01 MED ORDER — IOHEXOL 300 MG/ML  SOLN
INTRAMUSCULAR | Status: DC | PRN
Start: 2022-01-01 — End: 2022-01-01
  Administered 2022-01-01: 62 mL

## 2022-01-01 SURGICAL SUPPLY — 16 items
CATH BALLN WEDGE 5F 110CM (CATHETERS) ×2 IMPLANT
CATH INFINITI 5 FR JL3.5 (CATHETERS) ×2 IMPLANT
CATH INFINITI 5FR JK (CATHETERS) ×2 IMPLANT
CATH INFINITI JR4 5F (CATHETERS) IMPLANT
DEVICE RAD TR BAND REGULAR (VASCULAR PRODUCTS) ×2 IMPLANT
DRAPE BRACHIAL (DRAPES) ×4 IMPLANT
GLIDESHEATH SLEND SS 6F .021 (SHEATH) ×2 IMPLANT
GUIDEWIRE .025 260CM (WIRE) ×2 IMPLANT
GUIDEWIRE INQWIRE 1.5J.035X260 (WIRE) IMPLANT
INQWIRE 1.5J .035X260CM (WIRE) ×3
PACK CARDIAC CATH (CUSTOM PROCEDURE TRAY) ×3 IMPLANT
PANNUS RETENTION SYSTEM 2 PAD (MISCELLANEOUS) ×2 IMPLANT
PROTECTION STATION PRESSURIZED (MISCELLANEOUS) ×3
SET ATX SIMPLICITY (MISCELLANEOUS) ×2 IMPLANT
SHEATH GLIDE SLENDER 4/5FR (SHEATH) ×2 IMPLANT
STATION PROTECTION PRESSURIZED (MISCELLANEOUS) IMPLANT

## 2022-01-01 NOTE — Interval H&P Note (Signed)
History and Physical Interval Note: ? ?01/01/2022 ?10:06 AM ? ?Gray Maugeri  has presented today for surgery, with the diagnosis of RT and LT Cath   Cardiomyopathy  Decreased ejection fraction.  The various methods of treatment have been discussed with the patient and family. After consideration of risks, benefits and other options for treatment, the patient has consented to  Procedure(s): ?RIGHT/LEFT HEART CATH AND CORONARY ANGIOGRAPHY (Bilateral) as a surgical intervention.  The patient's history has been reviewed, patient examined, no change in status, stable for surgery.  I have reviewed the patient's chart and labs.  Questions were answered to the patient's satisfaction.   ? ? ?Lorine Bears ? ? ?

## 2022-01-03 ENCOUNTER — Telehealth: Payer: Self-pay

## 2022-01-03 DIAGNOSIS — I509 Heart failure, unspecified: Secondary | ICD-10-CM

## 2022-01-03 MED ORDER — TORSEMIDE 20 MG PO TABS: 20.0000 mg | ORAL_TABLET | Freq: Every day | ORAL | 3 refills | Status: DC

## 2022-01-03 MED ORDER — TORSEMIDE 40 MG PO TABS: 40.0000 mg | ORAL_TABLET | Freq: Every day | ORAL | 5 refills | Status: DC

## 2022-01-03 MED ORDER — REPATHA SURECLICK 140 MG/ML ~~LOC~~ SOAJ
1.0000 | SUBCUTANEOUS | 11 refills | Status: DC
Start: 2022-01-03 — End: 2022-07-02

## 2022-01-03 NOTE — Telephone Encounter (Signed)
-----   Message from Kate Sable, MD sent at 01/02/2022 10:38 AM EDT ----- ?Cholesterol not at goal, patient not tolerant to statins.  Please order Nexlizet 1 tab daily if her insurance approves.  If not we will consider PCSK9. ?

## 2022-01-03 NOTE — Telephone Encounter (Signed)
Spoke with Dr. Azucena Cecil before calling patient regarding the following recommendation from patients cath report on 01/01/22: ?Recommendations: ?The patient has nonischemic cardiomyopathy.  Recommend medical therapy. ?She is volume overloaded.  Consider increasing diuretic dose or adding an SGLT2 inhibitor. ?Dr. Azucena Cecil recommended the patient increase her Torsemide to 40 MG daily, and get a BMP in 1 week.  ? ?Called patient and reviewed Lipid panel result note, and the above recommendation regarding Torsemide. Patient stated that she had only been taking half a tablet of her Torsemide 20 MG as recommended by her other doctor. She said that's the dose she has been taking for at least a year. I instructed patient to take the full tablet (Torsemide 20 MG) once a day.  ? ?Patient verbalized understanding and scheduled a lab appointment for a BMP in our office next week 01/10/22. ? ? ? ?

## 2022-01-04 ENCOUNTER — Telehealth: Payer: Self-pay | Admitting: Cardiology

## 2022-01-04 NOTE — Telephone Encounter (Signed)
Pt c/o medication issue: ? ?1. Name of Medication: ezetimibe entresto or spironolactone  ? ?2. How are you currently taking this medication (dosage and times per day)?  ? ?3. Are you having a reaction (difficulty breathing--STAT)? Diarrhea, sick on stomach, does not feel well  ? ?4. What is your medication issue? Patient just started these new medications.  Would like to know if these symptoms could be caused by one or all of these medications.  Please call to discuss.  ? ?

## 2022-01-04 NOTE — Telephone Encounter (Signed)
Spoke with patient.  She notes diarrhea and upset stomach since starting ezetimibe, Entresto and spironolactone.   Will have her stop ezetimibe for now and continue with other medications.  Advised that if this is the problem medication, she let MD know, so that they can make other arrangements in treating cholesterol.  Patient states she should be soon starting injections to help with cholesterol as well.   ?

## 2022-01-10 ENCOUNTER — Other Ambulatory Visit: Payer: Self-pay

## 2022-01-10 ENCOUNTER — Other Ambulatory Visit: Payer: Medicare Other

## 2022-01-10 DIAGNOSIS — I5022 Chronic systolic (congestive) heart failure: Secondary | ICD-10-CM

## 2022-01-11 ENCOUNTER — Other Ambulatory Visit
Admission: RE | Admit: 2022-01-11 | Discharge: 2022-01-11 | Disposition: A | Discharge status: Home / Self Care | Payer: Medicare Other | Source: Ambulatory Visit | Attending: Cardiology | Admitting: Cardiology

## 2022-01-11 DIAGNOSIS — I5022 Chronic systolic (congestive) heart failure: Secondary | ICD-10-CM | POA: Insufficient documentation

## 2022-01-11 LAB — BASIC METABOLIC PANEL
Anion gap: 10 (ref 5–15)
BUN: 23 mg/dL (ref 8–23)
CO2: 29 mmol/L (ref 22–32)
Calcium: 9.6 mg/dL (ref 8.9–10.3)
Chloride: 98 mmol/L (ref 98–111)
Creatinine, Ser: 0.84 mg/dL (ref 0.44–1.00)
GFR, Estimated: >60 mL/min (ref >60)
Glucose, Bld: 329 mg/dL — ABNORMAL HIGH (ref 70–99)
Potassium: 4.3 mmol/L (ref 3.5–5.1)
Sodium: 137 mmol/L (ref 135–145)

## 2022-01-30 ENCOUNTER — Ambulatory Visit (INDEPENDENT_AMBULATORY_CARE_PROVIDER_SITE_OTHER): Payer: Medicare Other | Admitting: Cardiology

## 2022-01-30 ENCOUNTER — Encounter: Payer: Self-pay | Admitting: Cardiology

## 2022-01-30 VITALS — BP 110/60 | HR 93 | Ht 62.0 in | Wt 223.0 lb

## 2022-01-30 DIAGNOSIS — I10 Essential (primary) hypertension: Secondary | ICD-10-CM

## 2022-01-30 DIAGNOSIS — I428 Other cardiomyopathies: Secondary | ICD-10-CM | POA: Diagnosis not present

## 2022-01-30 DIAGNOSIS — I2510 Atherosclerotic heart disease of native coronary artery without angina pectoris: Secondary | ICD-10-CM | POA: Diagnosis not present

## 2022-01-30 NOTE — Patient Instructions (Addendum)
Medication Instructions:  ?Your physician recommends that you continue on your current medications as directed. Please refer to the Current Medication list given to you today. ? ?*If you need a refill on your cardiac medications before your next appointment, please call your pharmacy* ? ? ?Lab Work: ?Your physician recommends that you return for a FASTING lipid profile: in 4 months ? ?Please have your lab drawn at the [facility]. No appt needed. Lab hour are M-F 7am-6pm. Stop at the Registration desk to check in. ? ?If you have labs (blood work) drawn today and your tests are completely normal, you will receive your results only by: ?MyChart Message (if you have MyChart) OR ?A paper copy in the mail ?If you have any lab test that is abnormal or we need to change your treatment, we will call you to review the results. ? ? ?Testing/Procedures: ?Your physician has requested that you have an echocardiogram. Echocardiography is a painless test that uses sound waves to create images of your heart. It provides your doctor with information about the size and shape of your heart and how well your heart?s chambers and valves are working. This procedure takes approximately one hour. There are no restrictions for this procedure. ?(To be scheduled in 4 months) ? ? ?Follow-Up: ?At [facility], you and your health needs are our priority.  As part of our continuing mission to provide you with exceptional heart care, we have created designated Provider Care Teams.  These Care Teams include your primary Cardiologist (physician) and Advanced Practice Providers (APPs -  Physician Assistants and Nurse Practitioners) who all work together to provide you with the care you need, when you need it. ? ?We recommend signing up for the patient portal called "MyChart".  Sign up information is provided on this After Visit Summary.  MyChart is used to connect with patients for Virtual Visits (Telemedicine).  Patients are able to view  lab/test results, encounter notes, upcoming appointments, etc.  Non-urgent messages can be sent to your provider as well.   ?To learn more about what you can do with MyChart, go to [email or web address].   ? ?Your next appointment:   ?5 month(s) ? ?The format for your next appointment:   ?In Person ? ?Provider:   ?Kate Sable, MD{ ? ? ? ? ?Other Instructions ? ? ?Important Information About Sugar ? ? ? ? ? ? ?

## 2022-01-30 NOTE — Progress Notes (Signed)
?Cardiology Office Note:   ? ?Date:  01/30/2022  ? ?ID:  Betty Luna, DOB Apr 28, 1951, MRN [id number] ? ?PCP:  Josephine Cables, MD ?  ?[facility] Providers ?Cardiologist:  Kate Sable, MD    ? ?Referring MD: Josephine Cables, MD  ? ?Chief Complaint  ?Patient presents with  ? Other  ?  1 month follow up -- Meds reviewed verbally with patient.   ? ? ?History of Present Illness:   ? ?Betty Luna is a 71 y.o. female with a hx of nonischemic cardiomyopathy EF 35 to 40%, hypertension, hyperlipidemia who presents for follow-up.  ? ?Being seen for cardiomyopathy.  Previous echo showed moderately reduced EF.  Underwent left heart cath showing mild nonobstructive disease 20% LAD.  Started on Entresto, tolerating doses of Coreg, Aldactone.  Patient was started on Repatha due to statin allergies.  Unsure on how to use Repatha.  Denies edema, shortness of breath improved, denies chest pain ? ?Prior notes ?Left heart cath 12/2021, 20% mid LAD. ?Echo 12/2021 showed EF 35 to 40% ?Lexiscan Myoview 12/06/2021 no significant ischemia, mild LAD calcifications, EF 24%  ?Echo 06/2021 EF 50 to 55%. ? ?Past Medical History:  ?Diagnosis Date  ? Anemia   ? Arthritis   ? CHF (congestive heart failure) ([place])   ? Depression   ? Diabetes mellitus without complication ([place])   ? GERD (gastroesophageal reflux disease)   ? Gout   ? Hypertension   ? ? ?Past Surgical History:  ?Procedure Laterality Date  ? ABDOMINAL HYSTERECTOMY    ? CATARACT EXTRACTION W/PHACO Right 04/16/2017  ? Procedure: CATARACT EXTRACTION PHACO AND INTRAOCULAR LENS PLACEMENT (IOC);  Surgeon: Birder Robson, MD;  Location: [facility] ORS;  Service: Ophthalmology;  Laterality: Right;  [place] 01:06 ?AP% 16.5 ?CDE 11.03 ?Fluid pack lot # [id number]  ? CATARACT EXTRACTION W/PHACO Left 10/01/2017  ? Procedure: CATARACT EXTRACTION PHACO AND INTRAOCULAR LENS PLACEMENT (IOC)-LEFT DIABETIC;  Surgeon: Birder Robson, MD;  Location: [facility] ORS;  Service: Ophthalmology;  Laterality:  Left;  [place] 01:05.7 ?AP% 12.2 ?CDE 7.42 ?Fluid pack lot # [id number]  ? CTR Left   ? RIGHT/LEFT HEART CATH AND CORONARY ANGIOGRAPHY Bilateral 01/01/2022  ? Procedure: RIGHT/LEFT HEART CATH AND CORONARY ANGIOGRAPHY;  Surgeon: Wellington Hampshire, MD;  Location: [facility];  Service: Cardiovascular;  Laterality: Bilateral;  ? TONSILLECTOMY    ? ? ?Current Medications: ?Current Meds  ?Medication Sig  ? acetaminophen (TYLENOL) 500 MG tablet Take 500 mg by mouth every 6 (six) hours as needed for moderate pain.  ? aspirin EC 81 MG tablet Take 81 mg by mouth daily.  ? carvedilol (COREG) 25 MG tablet Take 25 mg by mouth 2 (two) times daily.   ? colchicine 0.6 MG tablet Take 0.6 mg by mouth daily.  ? ezetimibe (ZETIA) 10 MG tablet Take 1 tablet (10 mg total) by mouth daily.  ? ferrous gluconate (FERGON) 324 MG tablet Take 1 tablet (324 mg total) by mouth daily with breakfast.  ? hydrOXYzine (ATARAX) 25 MG tablet Take 25 mg by mouth 2 (two) times daily as needed for anxiety.  ? metFORMIN (GLUCOPHAGE) 500 MG tablet Take 1,000 mg by mouth 2 (two) times daily.  ? pantoprazole (PROTONIX) 40 MG tablet Take 40 mg by mouth daily.  ? sacubitril-valsartan (ENTRESTO) 49-51 MG Take 1 tablet by mouth 2 (two) times daily.  ? spironolactone (ALDACTONE) 25 MG tablet Take 1 tablet (25 mg total) by mouth daily. STOP taking your Amlodipine  ? torsemide (DEMADEX) 20 MG  tablet Take 1 tablet (20 mg total) by mouth daily. NO NEED TO REFILL AT THIS TIME, Patient just picked up a prescription. Change of provider. ?CANCEL THE 40 MG PRESCRIPTION.  ?  ? ?Allergies:   Omeprazole, Tramadol, Atorvastatin, and Codeine  ? ?Social History  ? ?Socioeconomic History  ? Marital status: Married  ?  Spouse name: Not on file  ? Number of children: Not on file  ? Years of education: Not on file  ? Highest education level: Not on file  ?Occupational History  ? Not on file  ?Tobacco Use  ? Smoking status: Former  ? Smokeless tobacco: Current  ?  Types: Snuff   ?Vaping Use  ? Vaping Use: Never used  ?Substance and Sexual Activity  ? Alcohol use: Yes  ?  Comment: occasonally  ? Drug use: No  ? Sexual activity: Not on file  ?Other Topics Concern  ? Not on file  ?Social History Narrative  ? Not on file  ? ?Social Determinants of Health  ? ?Financial Resource Strain: Not on file  ?Food Insecurity: Not on file  ?Transportation Needs: Not on file  ?Physical Activity: Not on file  ?Stress: Not on file  ?Social Connections: Not on file  ?  ? ?Family History: ?The patient's family history includes Alcohol abuse in her brother; Breast cancer in her sister. ? ?ROS:   ?Please see the history of present illness.    ? All other systems reviewed and are negative. ? ?EKGs/Labs/Other Studies Reviewed:   ? ?The following studies were reviewed today: ? ? ?EKG:  EKG not  ordered today.  ? ?Recent Labs: ?07/20/2021: ALT 30; Magnesium 2.0 ?12/10/2021: Hemoglobin 12.1; Platelets 239 ?01/11/2022: BUN 23; Creatinine, Ser 0.84; Potassium 4.3; Sodium 137  ?Recent Lipid Panel ?   ?Component Value Date/Time  ? CHOL 203 (H) 12/29/2021 1115  ? TRIG 133 12/29/2021 1115  ? HDL 53 12/29/2021 1115  ? CHOLHDL 3.8 12/29/2021 1115  ? LDLCALC 126 (H) 12/29/2021 1115  ? ? ? ?Risk Assessment/Calculations:   ? ? ?    ? ?Physical Exam:   ? ?VS:  BP 110/60 (BP Location: Left Arm, Patient Position: Sitting, Cuff Size: Normal)   Pulse 93   Ht 5\' 2"  (1.575 m)   Wt 223 lb (101.2 kg)   SpO2 97%   BMI 40.79 kg/m?    ? ?Wt Readings from Last 3 Encounters:  ?01/30/22 223 lb (101.2 kg)  ?01/01/22 219 lb (99.3 kg)  ?12/29/21 226 lb (102.5 kg)  ?  ? ?GEN:  Well nourished, well developed in no acute distress ?HEENT: Normal ?NECK: No JVD; No carotid bruits ?CARDIAC: RRR, no murmurs, rubs, gallops ?RESPIRATORY:  Clear to auscultation without rales, wheezing or rhonchi  ?ABDOMEN: Soft, non-tender, non-distended ?MUSCULOSKELETAL:  No edema; No deformity  ?SKIN: Warm and dry ?NEUROLOGIC:  Alert and oriented x 3 ?PSYCHIATRIC:   Normal affect  ? ?ASSESSMENT:   ? ?1. NICM (nonischemic cardiomyopathy) ([place])   ?2. Primary hypertension   ?3. Morbid obesity ([place])   ?4. Coronary artery disease involving native coronary artery of native heart without angina pectoris   ? ? ? ?PLAN:   ? ?In order of problems listed above: ? ?Nonischemic cardiomyopathy EF 35 to 40%.  Appears euvolemic.  NYHA class II-III symptoms.  low normal diastolic BP. continue Entresto to 49-51mg  twice daily.  Continue Coreg 25 mg twice daily, Aldactone 25 mg daily.  Continue torsemide 20 mg daily.  Repeat echo in 4 months ?  Hypertension, BP controlled.  Coreg, Entresto, Aldactone as above. ?Obesity, low-calorie diet, weight loss advised. ?Nonobstructive CAD, 20% mid LAD, continue aspirin 81 mg, Repatha.  Consider stopping Zetia if cholesterol adequately controlled. ? ?   ?Follow-up in 4 months after repeat echo. ? ? ? ? ?Medication Adjustments/Labs and Tests Ordered: ?Current medicines are reviewed at length with the patient today.  Concerns regarding medicines are outlined above.  ?Orders Placed This Encounter  ?Procedures  ? Lipid panel  ? ECHOCARDIOGRAM COMPLETE  ? ?No orders of the defined types were placed in this encounter. ? ? ?Patient Instructions  ?Medication Instructions:  ?Your physician recommends that you continue on your current medications as directed. Please refer to the Current Medication list given to you today. ? ?*If you need a refill on your cardiac medications before your next appointment, please call your pharmacy* ? ? ?Lab Work: ?Your physician recommends that you return for a FASTING lipid profile: in 4 months ? ?Please have your lab drawn at the [facility]. No appt needed. Lab hour are M-F 7am-6pm. Stop at the Registration desk to check in. ? ?If you have labs (blood work) drawn today and your tests are completely normal, you will receive your results only by: ?MyChart Message (if you have MyChart) OR ?A paper copy in the mail ?If you have any  lab test that is abnormal or we need to change your treatment, we will call you to review the results. ? ? ?Testing/Procedures: ?Your physician has requested that you have an echocardiogram. Echocardiography is a pai

## 2022-02-06 ENCOUNTER — Other Ambulatory Visit: Payer: Self-pay

## 2022-02-06 ENCOUNTER — Emergency Department: Payer: Medicare Other

## 2022-02-06 ENCOUNTER — Observation Stay
Admission: EM | Admit: 2022-02-06 | Discharge: 2022-02-07 | Disposition: A | Discharge status: Home / Self Care | Payer: Medicare Other | Attending: Internal Medicine | Admitting: Internal Medicine

## 2022-02-06 ENCOUNTER — Encounter: Payer: Self-pay | Admitting: Emergency Medicine

## 2022-02-06 DIAGNOSIS — E1165 Type 2 diabetes mellitus with hyperglycemia: Secondary | ICD-10-CM

## 2022-02-06 DIAGNOSIS — Z79899 Other long term (current) drug therapy: Secondary | ICD-10-CM | POA: Diagnosis not present

## 2022-02-06 DIAGNOSIS — Z87891 Personal history of nicotine dependence: Secondary | ICD-10-CM | POA: Diagnosis not present

## 2022-02-06 DIAGNOSIS — R197 Diarrhea, unspecified: Secondary | ICD-10-CM

## 2022-02-06 DIAGNOSIS — Z7982 Long term (current) use of aspirin: Secondary | ICD-10-CM | POA: Insufficient documentation

## 2022-02-06 DIAGNOSIS — N179 Acute kidney failure, unspecified: Secondary | ICD-10-CM

## 2022-02-06 DIAGNOSIS — K529 Noninfective gastroenteritis and colitis, unspecified: Principal | ICD-10-CM | POA: Diagnosis present

## 2022-02-06 DIAGNOSIS — R109 Unspecified abdominal pain: Secondary | ICD-10-CM | POA: Diagnosis present

## 2022-02-06 DIAGNOSIS — Z7984 Long term (current) use of oral hypoglycemic drugs: Secondary | ICD-10-CM | POA: Diagnosis not present

## 2022-02-06 DIAGNOSIS — I5022 Chronic systolic (congestive) heart failure: Secondary | ICD-10-CM | POA: Diagnosis present

## 2022-02-06 DIAGNOSIS — I110 Hypertensive heart disease with heart failure: Secondary | ICD-10-CM | POA: Diagnosis not present

## 2022-02-06 DIAGNOSIS — E872 Acidosis, unspecified: Secondary | ICD-10-CM

## 2022-02-06 DIAGNOSIS — I10 Essential (primary) hypertension: Secondary | ICD-10-CM | POA: Diagnosis present

## 2022-02-06 LAB — URINALYSIS, ROUTINE W REFLEX MICROSCOPIC
Bilirubin Urine: NEGATIVE
Glucose, UA: 150 mg/dL — AB
Hgb urine dipstick: NEGATIVE
Ketones, ur: NEGATIVE mg/dL
Nitrite: NEGATIVE
Protein, ur: NEGATIVE mg/dL
Specific Gravity, Urine: 1.012 (ref 1.005–1.030)
pH: 5 (ref 5.0–8.0)

## 2022-02-06 LAB — BLOOD GAS, VENOUS
Acid-Base Excess: 0 mmol/L (ref 0.0–2.0)
Bicarbonate: 25.4 mmol/L (ref 20.0–28.0)
O2 Saturation: 91.1 %
Patient temperature: 37
pCO2, Ven: 43 mmHg — ABNORMAL LOW (ref 44–60)
pH, Ven: 7.38 (ref 7.25–7.43)
pO2, Ven: 58 mmHg — ABNORMAL HIGH (ref 32–45)

## 2022-02-06 LAB — BETA-HYDROXYBUTYRIC ACID: Beta-Hydroxybutyric Acid: 0.21 mmol/L (ref 0.05–0.27)

## 2022-02-06 LAB — COMPREHENSIVE METABOLIC PANEL WITH GFR
ALT: 39 U/L (ref 0–44)
AST: 59 U/L — ABNORMAL HIGH (ref 15–41)
Albumin: 3.9 g/dL (ref 3.5–5.0)
Alkaline Phosphatase: 90 U/L (ref 38–126)
Anion gap: 16 — ABNORMAL HIGH (ref 5–15)
BUN: 34 mg/dL — ABNORMAL HIGH (ref 8–23)
CO2: 21 mmol/L — ABNORMAL LOW (ref 22–32)
Calcium: 9.8 mg/dL (ref 8.9–10.3)
Chloride: 97 mmol/L — ABNORMAL LOW (ref 98–111)
Creatinine, Ser: 1.33 mg/dL — ABNORMAL HIGH (ref 0.44–1.00)
GFR, Estimated: 43 mL/min — ABNORMAL LOW
Glucose, Bld: 356 mg/dL — ABNORMAL HIGH (ref 70–99)
Potassium: 4.7 mmol/L (ref 3.5–5.1)
Sodium: 134 mmol/L — ABNORMAL LOW (ref 135–145)
Total Bilirubin: 0.6 mg/dL (ref 0.3–1.2)
Total Protein: 7.4 g/dL (ref 6.5–8.1)

## 2022-02-06 LAB — CBC
HCT: 41.5 % (ref 36.0–46.0)
Hemoglobin: 13.1 g/dL (ref 12.0–15.0)
MCH: 23.7 pg — ABNORMAL LOW (ref 26.0–34.0)
MCHC: 31.6 g/dL (ref 30.0–36.0)
MCV: 75 fL — ABNORMAL LOW (ref 80.0–100.0)
Platelets: 268 10*3/uL (ref 150–400)
RBC: 5.53 MIL/uL — ABNORMAL HIGH (ref 3.87–5.11)
RDW: 14.6 % (ref 11.5–15.5)
WBC: 7.1 10*3/uL (ref 4.0–10.5)
nRBC: 0 % (ref 0.0–0.2)

## 2022-02-06 LAB — TROPONIN I (HIGH SENSITIVITY): Troponin I (High Sensitivity): 8 ng/L (ref <18)

## 2022-02-06 LAB — CBG MONITORING, ED
Glucose-Capillary: 330 mg/dL — ABNORMAL HIGH (ref 70–99)
Glucose-Capillary: 220 mg/dL — ABNORMAL HIGH (ref 70–99)

## 2022-02-06 LAB — LACTIC ACID, PLASMA
Lactic Acid, Venous: 3.8 mmol/L (ref 0.5–1.9)
Lactic Acid, Venous: 2.3 mmol/L (ref 0.5–1.9)

## 2022-02-06 LAB — LIPASE, BLOOD: Lipase: 55 U/L — ABNORMAL HIGH (ref 11–51)

## 2022-02-06 LAB — GLUCOSE, CAPILLARY: Glucose-Capillary: 189 mg/dL — ABNORMAL HIGH (ref 70–99)

## 2022-02-06 MED ORDER — INSULIN ASPART 100 UNIT/ML IJ SOLN
0.0000 [IU] | Freq: Every day | INTRAMUSCULAR | Status: DC
  Administered 2022-02-06: 2 [IU] via SUBCUTANEOUS
  Filled 2022-02-06: qty 1

## 2022-02-06 MED ORDER — ONDANSETRON HCL 4 MG PO TABS: 4.0000 mg | ORAL_TABLET | Freq: Four times a day (QID) | ORAL | Status: DC | PRN

## 2022-02-06 MED ORDER — ACETAMINOPHEN 325 MG PO TABS: 650.0000 mg | ORAL_TABLET | Freq: Four times a day (QID) | ORAL | Status: DC | PRN

## 2022-02-06 MED ORDER — FAMOTIDINE IN NACL 20-0.9 MG/50ML-% IV SOLN
20.0000 mg | Freq: Once | INTRAVENOUS | Status: AC
  Administered 2022-02-06: 20 mg via INTRAVENOUS
  Filled 2022-02-06: qty 50

## 2022-02-06 MED ORDER — ACETAMINOPHEN 650 MG RE SUPP: 650.0000 mg | Freq: Four times a day (QID) | RECTAL | Status: DC | PRN

## 2022-02-06 MED ORDER — ENOXAPARIN SODIUM 60 MG/0.6ML IJ SOSY
0.5000 mg/kg | PREFILLED_SYRINGE | INTRAMUSCULAR | Status: DC
  Administered 2022-02-06: 50 mg via SUBCUTANEOUS
  Filled 2022-02-06: qty 0.6

## 2022-02-06 MED ORDER — SODIUM CHLORIDE 0.9 % IV BOLUS
1000.0000 mL | Freq: Once | INTRAVENOUS | Status: AC
  Administered 2022-02-06: 1000 mL via INTRAVENOUS

## 2022-02-06 MED ORDER — ONDANSETRON HCL 4 MG/2ML IJ SOLN
4.0000 mg | Freq: Four times a day (QID) | INTRAMUSCULAR | Status: DC | PRN
Start: 2022-02-06 — End: 2022-02-07

## 2022-02-06 MED ORDER — ONDANSETRON HCL 4 MG/2ML IJ SOLN
4.0000 mg | Freq: Once | INTRAMUSCULAR | Status: AC
  Administered 2022-02-06: 4 mg via INTRAVENOUS
  Filled 2022-02-06: qty 2

## 2022-02-06 MED ORDER — OXYCODONE HCL 5 MG PO TABS: 5.0000 mg | ORAL_TABLET | ORAL | Status: DC | PRN

## 2022-02-06 MED ORDER — IOHEXOL 300 MG/ML  SOLN
100.0000 mL | Freq: Once | INTRAMUSCULAR | Status: AC | PRN
Start: 2022-02-06 — End: 2022-02-06
  Administered 2022-02-06: 80 mL via INTRAVENOUS

## 2022-02-06 MED ORDER — INSULIN ASPART 100 UNIT/ML IJ SOLN
0.0000 [IU] | Freq: Three times a day (TID) | INTRAMUSCULAR | Status: DC
  Administered 2022-02-07: 3 [IU] via SUBCUTANEOUS
  Administered 2022-02-07: 5 [IU] via SUBCUTANEOUS
  Filled 2022-02-06 (×2): qty 1

## 2022-02-06 NOTE — Assessment & Plan Note (Signed)
Low suspicion for sepsis.  Possibly related to dehydration, gastroenteritis in combination with metformin ?Patient is not afebrile or tachycardic and WBC is normal ?Lactic acid already trending down with hydration ?Holding off on antibiotics for now ?

## 2022-02-06 NOTE — Assessment & Plan Note (Signed)
Creatinine 1.33 above baseline of 0.8 ?IV hydration and monitor, avoiding nephrotoxins ?

## 2022-02-06 NOTE — Assessment & Plan Note (Signed)
Blood sugar 356 with anion gap of 16 but with normal beta hydroxybutyric acid so DKA not suspected ?IV hydration and sliding scale insulin ?Consider single IV bolus insulin for more rapid correction of blood sugar ?Follow A1c ?

## 2022-02-06 NOTE — ED Provider Notes (Signed)
Based ? ?[facility] ?Provider Note ? ? ? Event Date/Time  ? First MD Initiated Contact with Patient 02/06/22 1701   ?  (approximate) ? ? ?History  ? ?Abdominal Pain ? ? ?HPI ? ?Betty Luna is a 71 y.o. female with past medical history of nonischemic cardiomyopathy, diabetes on metformin, GERD who presents with elevated blood sugar and abdominal pain.  Her abdominal pain has been going on since Sunday.  Describes a woozy like feeling in the center of her abdomen that is rather constant.  Worse after eating so has not eaten much.  Has had nausea but no vomiting denies urinary symptoms.  Denies fevers has had some chills.  Also has had diarrhea about 2-3 episodes of nonbloody diarrhea per day since Sunday.  Patient tells me that she really did not start checking her blood sugar until several days ago.  A1c elevated around 10.  She is only on metformin.  Had been on insulin in the past but not recently. ?  ? ?Past Medical History:  ?Diagnosis Date  ? Anemia   ? Arthritis   ? CHF (congestive heart failure) ([place])   ? Depression   ? Diabetes mellitus without complication ([place])   ? GERD (gastroesophageal reflux disease)   ? Gout   ? Hypertension   ? ? ?Patient Active Problem List  ? Diagnosis Date Noted  ? Coronary artery calcification   ? Microcytic anemia 07/22/2021  ? Bacteremia due to Gram-negative bacteria 07/20/2021  ? Sepsis due to gram-negative UTI ([place]) 07/19/2021  ? Sepsis secondary to UTI ([place]) 07/19/2021  ? Chronic systolic CHF (congestive heart failure) ([place]) 04/03/2021  ? Cardiomyopathy ([place]) 04/03/2021  ? Contusion of left hip 04/03/2021  ? Accidental fall 04/03/2021  ? Alcohol use 04/03/2021  ? Closed fracture of base of fifth metatarsal bone   ? Congestive heart failure, unspecified HF chronicity, unspecified heart failure type ([place]) 12/29/2020  ? Type 2 diabetes mellitus without complication, with long-term current use of insulin ([place]) 12/29/2020  ? Shortness of breath 06/22/2019   ? Uncontrolled hypertension 06/22/2019  ? SOB (shortness of breath) 06/22/2019  ? Pyelonephritis 06/26/2017  ? Type 2 diabetes mellitus with stage 2 chronic kidney disease, with long-term current use of insulin ([place]) 08/08/2015  ? HTN (hypertension), benign 03/21/2014  ? ? ? ?Physical Exam  ?Triage Vital Signs: ?ED Triage Vitals  ?Enc Vitals Group  ?   BP 02/06/22 1550 (!) 110/57  ?   Pulse Rate 02/06/22 1550 88  ?   Resp 02/06/22 1550 17  ?   Temp 02/06/22 1550 98.4 ?F (36.9 ?C)  ?   Temp Source 02/06/22 1550 Oral  ?   SpO2 02/06/22 1550 97 %  ?   Weight 02/06/22 1546 215 lb (97.5 kg)  ?   Height 02/06/22 1546 5\' 2"  (1.575 m)  ?   Head Circumference --   ?   Peak Flow --   ?   Pain Score 02/06/22 1546 8  ?   Pain Loc --   ?   Pain Edu? --   ?   Excl. in [place]? --   ? ? ?Most recent vital signs: ?Vitals:  ? 02/06/22 1900 02/06/22 1930  ?BP: (!) 118/58 (!) 124/55  ?Pulse: 74 72  ?Resp: 17 19  ?Temp:    ?SpO2: 97% 99%  ? ? ? ?General: Awake, no distress.  ?CV:  Good peripheral perfusion.  ?Resp:  Normal effort.  ?Abd:  No distention.  Tenderness to palpation in the epigastric region left lower quadrant voluntary guarding ?Neuro:             Awake, Alert, Oriented x 3  ?Other:   ? ? ?ED Results / Procedures / Treatments  ?Labs ?(all labs ordered are listed, but only abnormal results are displayed) ?Labs Reviewed  ?LIPASE, BLOOD - Abnormal; Notable for the following components:  ?    Result Value  ? Lipase 55 (*)   ? All other components within normal limits  ?COMPREHENSIVE METABOLIC PANEL - Abnormal; Notable for the following components:  ? Sodium 134 (*)   ? Chloride 97 (*)   ? CO2 21 (*)   ? Glucose, Bld 356 (*)   ? BUN 34 (*)   ? Creatinine, Ser 1.33 (*)   ? AST 59 (*)   ? GFR, Estimated 43 (*)   ? Anion gap 16 (*)   ? All other components within normal limits  ?CBC - Abnormal; Notable for the following components:  ? RBC 5.53 (*)   ? MCV 75.0 (*)   ? MCH 23.7 (*)   ? All other components within normal limits   ?URINALYSIS, ROUTINE W REFLEX MICROSCOPIC - Abnormal; Notable for the following components:  ? Color, Urine YELLOW (*)   ? APPearance HAZY (*)   ? Glucose, UA 150 (*)   ? Leukocytes,Ua TRACE (*)   ? Bacteria, UA RARE (*)   ? All other components within normal limits  ?BLOOD GAS, VENOUS - Abnormal; Notable for the following components:  ? pCO2, Ven 43 (*)   ? pO2, Ven 58 (*)   ? All other components within normal limits  ?LACTIC ACID, PLASMA - Abnormal; Notable for the following components:  ? Lactic Acid, Venous 3.8 (*)   ? All other components within normal limits  ?LACTIC ACID, PLASMA - Abnormal; Notable for the following components:  ? Lactic Acid, Venous 2.3 (*)   ? All other components within normal limits  ?CBG MONITORING, ED - Abnormal; Notable for the following components:  ? Glucose-Capillary 330 (*)   ? All other components within normal limits  ?CULTURE, BLOOD (ROUTINE X 2)  ?CULTURE, BLOOD (ROUTINE X 2)  ?URINE CULTURE  ?GASTROINTESTINAL PANEL BY PCR, STOOL (REPLACES STOOL CULTURE)  ?BETA-HYDROXYBUTYRIC ACID  ?TROPONIN I (HIGH SENSITIVITY)  ? ? ? ?EKG ? ?EKG interpreted by myself shows a left bundle without Sgarbossa criteria on monitor and EKG, normal sinus rhythm ? ? ?RADIOLOGY ?Reviewed the CT abdomen pelvis that did not show any acute pathology ? ? ?PROCEDURES: ? ?Critical Care performed: No ? ?Procedures ? ?The patient is on the cardiac monitor to evaluate for evidence of arrhythmia and/or significant heart rate changes. ? ? ?MEDICATIONS ORDERED IN ED: ?Medications  ?sodium chloride 0.9 % bolus 1,000 mL (0 mLs Intravenous Stopped 02/06/22 1913)  ?ondansetron [facility]) injection 4 mg (4 mg Intravenous Given 02/06/22 1823)  ?famotidine (PEPCID) IVPB 20 mg premix (0 mg Intravenous Stopped 02/06/22 1847)  ?iohexol (OMNIPAQUE) 300 MG/ML solution 100 mL (80 mLs Intravenous Contrast Given 02/06/22 1756)  ? ? ? ?IMPRESSION / MDM / ASSESSMENT AND PLAN / ED COURSE  ?I reviewed the triage vital signs and the  nursing notes. ?             ?               ? ?Differential diagnosis includes, but is not limited to, DKA, diverticulitis, gastritis, gastroenteritis, UTI, biliary pathology, ACS ? ?  Patient is a 71 year old female with history of diabetes who presents with elevated blood sugar abdominal pain.  Abdominal pain is center of her abdomen/epigastric region seems to be rather constant associated with nausea and worse after eating.  Blood pressure is borderline she is 110/57 otherwise vitals are within normal limits.  Overall she appears nontoxic.  Abdomen is diffusely tender primarily in epigastric and left lower quadrant region although she is obese making the exam somewhat limited.  I reviewed her labs and she is hyperglycemic with a sugar around 350 and she also has a bicarb of 21 and an anion gap of 16.  There are no ketones in the urine.  We will check lactate BHB and VBG.  May be consistent with DKA.  UA has 11-20 WBCs but she does not have any urinary symptoms low suspicion for UTI.  CBC largely reassuring no leukocytosis or anemia.  We will obtain a CT of the abdomen pelvis.  She has a history of heart failure but will give a liter of fluid Zofran Pepcid and reassess. ? ?Initial lactate is 3.8.  Her BHB is negative so this is not DKA.  Suspect that the anion gap is from her acidosis from lactate.  After Zofran Pepcid and a liter of fluid patient is feeling improved and would like to eat.  Repeat lactate is 2.3.  I question whether there is a component of metformin lactic acidosis versus just dehydration from poor p.o. intake and diarrhea.  She has no white count is not febrile my suspicion for sepsis is overall low.  We will send blood cultures and urine cultures but defer antibiotics.  Does have an AKI with a creatinine of 1.3 from baseline of 0.8.  Will discuss with hospitalist for admission. ? ?  ? ? ?FINAL CLINICAL IMPRESSION(S) / ED DIAGNOSES  ? ?Final diagnoses:  ?Diarrhea, unspecified type  ?Lactic  acidosis  ? ? ? ?Rx / DC Orders  ? ?ED Discharge Orders   ? ? None  ? ?  ? ? ? ?Note:  This document was prepared using Dragon voice recognition software and may include unintentional dictation errors. ?  ?Betty Luna

## 2022-02-06 NOTE — Assessment & Plan Note (Signed)
IV hydration with close monitoring for fluid overload in view of history of cardiomyopathy and systolic heart failure ?IV antiemetics and supportive care ?Clear liquid diet to advance as tolerated ?Follow GI panel ?

## 2022-02-06 NOTE — ED Triage Notes (Signed)
Pt via POV from home. Pt c/o generalized abd pain, nausea, and issues with her blood sugars for the past weeks. States that they have been running high. Pt is A&OX4 and NAD ?

## 2022-02-06 NOTE — Assessment & Plan Note (Signed)
Blood pressure controlled but somewhat soft ?Will hold torsemide, spironolactone, Coreg and Entresto and resume in the a.m. ?

## 2022-02-06 NOTE — H&P (Addendum)
?History and Physical  ? ? ?Patient: Betty Luna OHK:[id number] DOB: 09/01/1951 ?DOA: 02/06/2022 ?DOS: the patient was seen and examined on 02/06/2022 ?PCP: Ethelda Chick, MD  ?Patient coming from: Home ? ?Chief Complaint:  ?Chief Complaint  ?Patient presents with  ? Abdominal Pain  ? ? ?HPI: Betty Luna is a 71 y.o. female with medical history significant for Non-insulin-dependent type 2 diabetes, HTN, systolic heart failure EF 35%, who presents to the ED with a 3-day history of nausea, vomiting and diarrhea associated with crampy abdominal pain.  She denies fever and chills and denies dysuria.  Vomiting is nonbloody and nonbilious and diarrhea is nonbloody and nonmelanotic. ?ED course and data review: On arrival BP 110/57 with otherwise normal vitals.  Blood work significant for normal WBC with hemoglobin of 13.1 but with lactic acid of 2.3.  Blood glucose 356 with anion gap of 16, bicarb 21 but normal beta hydroxybutyric acid of 0.21.  Creatinine 1.33 above baseline of 0.8.  Mildly elevated AST of 59.  Troponin 8.  Urinalysis mostly unremarkable.  VBG with normal pH.  EKG, personally viewed and interpreted showing sinus rhythm at 76 with LBBB and nonspecific ST-T wave changes.  CT abdomen and pelvis with no acute findings. ?Patient started on an IV fluid bolus given Zofran and famotidine.  Hospitalist consulted for admission.  GI panel sent from the ED not yet resulted.  ? ?Review of Systems: As mentioned in the history of present illness. All other systems reviewed and are negative. ?Past Medical History:  ?Diagnosis Date  ? Anemia   ? Arthritis   ? CHF (congestive heart failure) (HCC)   ? Depression   ? Diabetes mellitus without complication (HCC)   ? GERD (gastroesophageal reflux disease)   ? Gout   ? Hypertension   ? ?Past Surgical History:  ?Procedure Laterality Date  ? ABDOMINAL HYSTERECTOMY    ? CATARACT EXTRACTION W/PHACO Right 04/16/2017  ? Procedure: CATARACT EXTRACTION PHACO AND INTRAOCULAR  LENS PLACEMENT (IOC);  Surgeon: Galen Manila, MD;  Location: [facility] ORS;  Service: Ophthalmology;  Laterality: Right;  [place] 01:06 ?AP% 16.5 ?CDE 11.03 ?Fluid pack lot # [id number]  ? CATARACT EXTRACTION W/PHACO Left 10/01/2017  ? Procedure: CATARACT EXTRACTION PHACO AND INTRAOCULAR LENS PLACEMENT (IOC)-LEFT DIABETIC;  Surgeon: Galen Manila, MD;  Location: [facility] OR[facility];  Service: Ophthalmology;  Laterality: Left;  [place] 01:05.7 ?AP% 12.2 ?CDE 7.42 ?Fluid pack lot # [id number]  ? CTR Left   ? RIGHT/LEFT HEART CATH AND CORONARY ANGIOGRAPHY Bilateral 01/01/2022  ? Procedure: RIGHT/LEFT HEART CATH AND CORONARY ANGIOGRAPHY;  Surgeon: Iran Ouch, MD;  Location: [facility];  Service: Cardiovascular;  Laterality: Bilateral;  ? TONSILLECTOMY    ? ?Social History:  reports that she has quit smoking. Her smokeless tobacco use includes snuff. She reports current alcohol use. She reports that she does not use drugs. ? ?Allergies  ?Allergen Reactions  ? Omeprazole Other (See Comments)  ?  CHEST PAIN   ? Tramadol Nausea And Vomiting  ? Atorvastatin Other (See Comments)  ?  And another statin too, muscle pain  ? Codeine Anxiety and Other (See Comments)  ? ? ?Family History  ?Problem Relation Age of Onset  ? Alcohol abuse Brother   ? Breast cancer Sister   ? ? ?Prior to Admission medications   ?Medication Sig Start Date End Date Taking? Authorizing Provider  ?acetaminophen (TYLENOL) 500 MG tablet Take 500 mg by mouth every 6 (six) hours as needed for moderate pain.  [provider]  ?aspirin EC 81 MG tablet Take 81 mg by mouth daily.    [provider]  ?carvedilol (COREG) 25 MG tablet Take 25 mg by mouth 2 (two) times daily.  02/14/15   [provider]  ?colchicine 0.6 MG tablet Take 0.6 mg by mouth daily.    [provider]  ?Evolocumab (REPATHA SURECLICK) 140 MG/ML SOAJ Inject 1 pen into the skin every 14 (fourteen) days. ?Patient not taking: Reported on 01/30/2022 01/03/22    Debbe Odea, MD  ?ezetimibe (ZETIA) 10 MG tablet Take 1 tablet (10 mg total) by mouth daily. 12/29/21 03/29/22  Debbe Odea, MD  ?ferrous gluconate (FERGON) 324 MG tablet Take 1 tablet (324 mg total) by mouth daily with breakfast. 07/21/21   Calvert Cantor, MD  ?hydrOXYzine (ATARAX) 25 MG tablet Take 25 mg by mouth 2 (two) times daily as needed for anxiety.    [provider]  ?metFORMIN (GLUCOPHAGE) 500 MG tablet Take 1,000 mg by mouth 2 (two) times daily. 02/09/21   [provider]  ?pantoprazole (PROTONIX) 40 MG tablet Take 40 mg by mouth daily. 03/07/17   [provider]  ?sacubitril-valsartan (ENTRESTO) 49-51 MG Take 1 tablet by mouth 2 (two) times daily. 12/29/21   Debbe Odea, MD  ?spironolactone (ALDACTONE) 25 MG tablet Take 1 tablet (25 mg total) by mouth daily. STOP taking your Amlodipine 12/25/21 03/25/22  Debbe Odea, MD  ?torsemide (DEMADEX) 20 MG tablet Take 1 tablet (20 mg total) by mouth daily. NO NEED TO REFILL AT THIS TIME, Patient just picked up a prescription. Change of provider. ?CANCEL THE 40 MG PRESCRIPTION. 01/03/22   Debbe Odea, MD  ? ? ?Physical Exam: ?Vitals:  ? 02/06/22 1853 02/06/22 1900 02/06/22 1930 02/06/22 2030  ?BP: 134/68 (!) 118/58 (!) 124/55 125/66  ?Pulse: 70 74 72 83  ?Resp: (!) 8 17 19 18   ?Temp:      ?TempSrc:      ?SpO2: 93% 97% 99% 97%  ?Weight:      ?Height:      ? ?Physical Exam ?Vitals and nursing note reviewed.  ?Constitutional:   ?   General: She is not in acute distress. ?HENT:  ?   Head: Normocephalic and atraumatic.  ?Cardiovascular:  ?   Rate and Rhythm: Normal rate and regular rhythm.  ?   Pulses: Normal pulses.  ?   Heart sounds: Normal heart sounds.  ?Pulmonary:  ?   Effort: Pulmonary effort is normal.  ?   Breath sounds: Normal breath sounds.  ?Abdominal:  ?   Palpations: Abdomen is soft.  ?   Tenderness: There is no abdominal tenderness.  ?Neurological:  ?   Mental Status: Mental status is at baseline.   ? ? ? ?Data Reviewed: ?Relevant notes from primary care and specialist visits, past discharge summaries as available in EHR, including Care Everywhere. ?Prior diagnostic testing as pertinent to current admission diagnoses ?Updated medications and problem lists for reconciliation ?ED course, including vitals, labs, imaging, treatment and response to treatment ?Triage notes, nursing and pharmacy notes and ED provider's notes ?Notable results as noted in HPI ? ? ?Assessment and Plan: ?* Acute gastroenteritis ?IV hydration with close monitoring for fluid overload in view of history of cardiomyopathy and systolic heart failure ?IV antiemetics and supportive care ?Clear liquid diet to advance as tolerated ?Follow GI panel ? ?Lactic acidosis ?Low suspicion for sepsis.  Possibly related to dehydration, gastroenteritis in combination with metformin ?Patient is not afebrile or tachycardic  and WBC is normal ?Lactic acid already trending down with hydration ?Holding off on antibiotics for now ? ?AKI (acute kidney injury) (HCC) ?Creatinine 1.33 above baseline of 0.8 ?IV hydration and monitor, avoiding nephrotoxins ? ?Uncontrolled type 2 diabetes mellitus with hyperglycemia, without long-term current use of insulin (HCC) ?Blood sugar 356 with anion gap of 16 but with normal beta hydroxybutyric acid so DKA not suspected ?IV hydration and sliding scale insulin ?Consider single IV bolus insulin for more rapid correction of blood sugar ?Follow A1c ? ?Chronic systolic CHF (congestive heart failure) (HCC) ?Due to dehydration and soft blood pressure will hold torsemide, spironolactone, Entresto and Coreg for tonight to resume as appropriate ? ?HTN (hypertension), benign ?Blood pressure controlled but somewhat soft ?Will hold torsemide, spironolactone, Coreg and Entresto and resume in the a.m. ? ? ? ? ? ? ?Advance Care Planning:   Code Status: Prior  ? ?Consults: none ? ?Family Communication: none ? ?Severity of Illness: ?The  appropriate patient status for this patient is OBSERVATION. Observation status is judged to be reasonable and necessary in order to provide the required intensity of service to ensure the patient's safety. The patient's presenti

## 2022-02-06 NOTE — Assessment & Plan Note (Signed)
Due to dehydration and soft blood pressure will hold torsemide, spironolactone, Entresto and Coreg for tonight to resume as appropriate ?

## 2022-02-07 DIAGNOSIS — K529 Noninfective gastroenteritis and colitis, unspecified: Secondary | ICD-10-CM | POA: Diagnosis not present

## 2022-02-07 LAB — GLUCOSE, CAPILLARY
Glucose-Capillary: 192 mg/dL — ABNORMAL HIGH (ref 70–99)
Glucose-Capillary: 229 mg/dL — ABNORMAL HIGH (ref 70–99)

## 2022-02-07 LAB — CBC
HCT: 39.1 % (ref 36.0–46.0)
Hemoglobin: 12.3 g/dL (ref 12.0–15.0)
MCH: 23.6 pg — ABNORMAL LOW (ref 26.0–34.0)
MCHC: 31.5 g/dL (ref 30.0–36.0)
MCV: 75 fL — ABNORMAL LOW (ref 80.0–100.0)
Platelets: 248 10*3/uL (ref 150–400)
RBC: 5.21 MIL/uL — ABNORMAL HIGH (ref 3.87–5.11)
RDW: 14.8 % (ref 11.5–15.5)
WBC: 6.7 10*3/uL (ref 4.0–10.5)
nRBC: 0 % (ref 0.0–0.2)

## 2022-02-07 LAB — BASIC METABOLIC PANEL
Anion gap: 8 (ref 5–15)
BUN: 34 mg/dL — ABNORMAL HIGH (ref 8–23)
CO2: 26 mmol/L (ref 22–32)
Calcium: 9.3 mg/dL (ref 8.9–10.3)
Chloride: 105 mmol/L (ref 98–111)
Creatinine, Ser: 1.14 mg/dL — ABNORMAL HIGH (ref 0.44–1.00)
GFR, Estimated: 52 mL/min — ABNORMAL LOW (ref >60)
Glucose, Bld: 193 mg/dL — ABNORMAL HIGH (ref 70–99)
Potassium: 4 mmol/L (ref 3.5–5.1)
Sodium: 139 mmol/L (ref 135–145)

## 2022-02-07 LAB — HEMOGLOBIN A1C
Hgb A1c MFr Bld: 11.5 % — ABNORMAL HIGH (ref 4.8–5.6)
Mean Plasma Glucose: 283.35 mg/dL

## 2022-02-07 MED ORDER — SODIUM CHLORIDE 0.9 % IV SOLN: INTRAVENOUS | Status: DC

## 2022-02-07 MED ORDER — DICYCLOMINE HCL 20 MG PO TABS: 20.0000 mg | ORAL_TABLET | Freq: Four times a day (QID) | ORAL | 0 refills | Status: DC

## 2022-02-07 MED ORDER — LIVING WELL WITH DIABETES BOOK
Freq: Once | Status: DC
  Filled 2022-02-07: qty 1

## 2022-02-07 MED ORDER — ONDANSETRON HCL 4 MG PO TABS: 4.0000 mg | ORAL_TABLET | Freq: Every day | ORAL | 1 refills | Status: DC | PRN

## 2022-02-07 NOTE — Progress Notes (Signed)
Order to discharge pt home.  Discharge instructions/AVS given to patient and reviewed - education provided as needed.  Pt advised to call PCP and/or come back to the hospital if there are any problems. Pt verbalized understanding.    

## 2022-02-07 NOTE — Plan of Care (Signed)
  Problem: Education: Goal: Knowledge of General Education information will improve Description Including pain rating scale, medication(s)/side effects and non-pharmacologic comfort measures Outcome: Progressing   

## 2022-02-07 NOTE — Progress Notes (Signed)
[facility]Inpatient Diabetes Program Recommendations ? ?AACE/ADA: New Consensus Statement on Inpatient Glycemic Control (2015) ? ?Target Ranges:  Prepandial:   less than 140 mg/dL ?     Peak postprandial:   less than 180 mg/dL (1-2 hours) ?     Critically ill patients:  140 - 180 mg/dL  ? ?Lab Results  ?Component Value Date  ? GLUCAP 229 (H) 02/07/2022  ? HGBA1C 11.5 (H) 02/06/2022  ? ? ?Review of Glycemic Control ? Latest Reference Range & Units 07/20/21 13:54 02/06/22 15:51  ?Hemoglobin A1C 4.8 - 5.6 % 7.4 (H) 11.5 (H)  ?(H): Data is abnormally high ? ?Diabetes history: DM2 ?Outpatient Diabetes medications: Metformin 1 gm bid ?Current orders for Inpatient glycemic control: Novolog 0-15 units tid, 0-5 units hs ? ?Inpatient Diabetes Program Recommendations:   ?Spoke with pt about A1C (11.5 (average blood glucose 283 over the past 2-3 months) and explained what an A1C is, basic pathophysiology of DM Type 2, basic home care, basic diabetes diet nutrition principles, importance of checking CBGs and maintaining good CBG control to prevent long-term and short-term complications. Reviewed signs and symptoms of hyperglycemia and hypoglycemia and how to treat hypoglycemia at home. Also reviewed blood sugar goals at home.  ?RNs to provide ongoing basic DM education at bedside with this patient. Have ordered educational booklet. ? ?Patient states she has been under extreme stress due to losing her daughter last August, raising her 3 year old granddaughter, and her husband has dementia. Patient states she shares all of these concerns with her primary and PCP is adjusting her medications frequently. Patient thinks her PCP is starting her on insulin but hasn't received the prescription yet. Patient has been on insulin in the past and also gives her husband insulin injections when she needs to.  ? ?Thank you, ?Billy Fischer Mareesa Gathright, RN, MSN, CDE  ?Diabetes Coordinator ?Inpatient Glycemic Control Team ?Team Pager [phone]  (8am-5pm) ?02/07/2022 12:13 PM ? ? ? ? ?

## 2022-02-07 NOTE — Discharge Summary (Signed)
Physician Discharge Summary  ?Betty Luna UVO:[id number] DOB: [birth year] DOA: 02/06/2022 ? ?PCP: Ethelda Chick, MD ? ?Admit date: 02/06/2022 ?Discharge date: 02/07/2022 ? ?Admitted From: Home ?Disposition:  Home ? ?Recommendations for Outpatient Follow-up:  ?Follow up with PCP in 1-2 weeks ? ? ?Home Health:No ?Equipment/Devices:None  ? ?Discharge Condition:Stable  ?CODE STATUS:Full  ?Diet recommendation: Soft/bland ? ?Brief/Interim Summary: ? 71 y.o. female with medical history significant for Non-insulin-dependent type 2 diabetes, HTN, systolic heart failure EF 35%, who presents to the ED with a 3-day history of nausea, vomiting and diarrhea associated with crampy abdominal pain.  She denies fever and chills and denies dysuria.  Vomiting is nonbloody and nonbilious and diarrhea is nonbloody and nonmelanotic. ?ED course and data review: On arrival BP 110/57 with otherwise normal vitals.  Blood work significant for normal WBC with hemoglobin of 13.1 but with lactic acid of 2.3.  Blood glucose 356 with anion gap of 16, bicarb 21 but normal beta hydroxybutyric acid of 0.21.  Creatinine 1.33 above baseline of 0.8.  Mildly elevated AST of 59.  Troponin 8.  Urinalysis mostly unremarkable.  VBG with normal pH.  EKG, personally viewed and interpreted showing sinus rhythm at 76 with LBBB and nonspecific ST-T wave changes.  CT abdomen and pelvis with no acute findings. ?Patient started on an IV fluid bolus given Zofran and famotidine.  Hospitalist consulted for admission.  GI panel sent from the ED not yet resulted.  ? ?Patient remained hemodynamically stable.  No fevers noted on admission.  Patient tolerating p.o. intake.  Stable for discharge home.  Recommend soft/bland diet.  Suspect some element of food related viral gastroenteritis. ? ?Discharge Diagnoses:  ?Principal Problem: ?  Acute gastroenteritis ?Active Problems: ?  AKI (acute kidney injury) (HCC) ?  Lactic acidosis ?  Uncontrolled type 2 diabetes mellitus with  hyperglycemia, without long-term current use of insulin (HCC) ?  HTN (hypertension), benign ?  Chronic systolic CHF (congestive heart failure) (HCC) ? ?* Acute gastroenteritis ?Suspect food related viral gastroenteritis.  White count cleared.  Patient tolerating p.o. diet at time of discharge.  Kidney function improving.  Discharged home with instructions to follow soft bland diet.  As needed Zofran and Bentyl prescribed on discharge. ?  ?Lactic acidosis ?Lactic acid cleared.  Low suspicion for sepsis.  Likely related to dehydration in setting of gastroenteritis.  Afebrile, not tachycardic, normal white count.  No indication for antibiotics at time of discharge. ? ?Discharge Instructions ? ?Discharge Instructions   ? ? Diet - low sodium heart healthy   Complete by: As directed ?  ? Increase activity slowly   Complete by: As directed ?  ? ?  ? ?Allergies as of 02/07/2022   ? ?   Reactions  ? Omeprazole Other (See Comments)  ? CHEST PAIN   ? Tramadol Nausea And Vomiting  ? Atorvastatin Other (See Comments)  ? And another statin too, muscle pain  ? Codeine Anxiety, Other (See Comments)  ? ?  ? ?  ?Medication List  ?  ? ?STOP taking these medications   ? ?Trulicity 1.5 MG/0.5ML Sopn ?Generic drug: Dulaglutide ?  ? ?  ? ?TAKE these medications   ? ?acetaminophen 500 MG tablet ?Commonly known as: TYLENOL ?Take 500 mg by mouth every 6 (six) hours as needed for moderate pain. ?  ?aspirin EC 81 MG tablet ?Take 81 mg by mouth daily. ?  ?carvedilol 25 MG tablet ?Commonly known as: COREG ?Take 25 mg by mouth 2 (two) times daily. ?  ?  colchicine 0.6 MG tablet ?Take 0.6 mg by mouth daily. ?  ?dicyclomine 20 MG tablet ?Commonly known as: BENTYL ?Take 1 tablet (20 mg total) by mouth every 6 (six) hours for 7 days. ?  ?Entresto 49-51 MG ?Generic drug: sacubitril-valsartan ?Take 1 tablet by mouth 2 (two) times daily. ?  ?escitalopram 20 MG tablet ?Commonly known as: LEXAPRO ?Take 20 mg by mouth daily. ?  ?ezetimibe 10 MG  tablet ?Commonly known as: ZETIA ?Take 1 tablet (10 mg total) by mouth daily. ?  ?ferrous gluconate 324 MG tablet ?Commonly known as: FERGON ?Take 1 tablet (324 mg total) by mouth daily with breakfast. ?  ?hydrOXYzine 25 MG tablet ?Commonly known as: ATARAX ?Take 25 mg by mouth 2 (two) times daily as needed for anxiety. ?  ?metFORMIN 500 MG tablet ?Commonly known as: GLUCOPHAGE ?Take 1,000 mg by mouth 2 (two) times daily. ?  ?ondansetron 4 MG tablet ?Commonly known as: Zofran ?Take 1 tablet (4 mg total) by mouth daily as needed for nausea or vomiting. ?  ?pantoprazole 40 MG tablet ?Commonly known as: PROTONIX ?Take 40 mg by mouth daily. ?  ?Repatha SureClick 140 MG/ML Soaj ?Generic drug: Evolocumab ?Inject 1 pen into the skin every 14 (fourteen) days. ?  ?spironolactone 25 MG tablet ?Commonly known as: ALDACTONE ?Take 1 tablet (25 mg total) by mouth daily. STOP taking your Amlodipine ?  ?torsemide 20 MG tablet ?Commonly known as: DEMADEX ?Take 20 mg by mouth daily. ?  ? ?  ? ? ?Allergies  ?Allergen Reactions  ? Omeprazole Other (See Comments)  ?  CHEST PAIN   ? Tramadol Nausea And Vomiting  ? Atorvastatin Other (See Comments)  ?  And another statin too, muscle pain  ? Codeine Anxiety and Other (See Comments)  ? ? ?Consultations: ?None ? ? ?Procedures/Studies: ?CT ABDOMEN PELVIS W CONTRAST ? ?Result Date: 02/06/2022 ?CLINICAL DATA:  Generalized abdominal pain, nausea. EXAM: CT ABDOMEN AND PELVIS WITH CONTRAST TECHNIQUE: Multidetector CT imaging of the abdomen and pelvis was performed using the standard protocol following bolus administration of intravenous contrast. RADIATION DOSE REDUCTION: This exam was performed according to the departmental dose-optimization program which includes automated exposure control, adjustment of the mA and/or kV according to patient size and/or use of iterative reconstruction technique. CONTRAST:  40mL OMNIPAQUE IOHEXOL 300 MG/ML  SOLN COMPARISON:  07/18/2021 FINDINGS: Lower chest:  Coronary artery and aortic calcifications. No acute abnormality. Hepatobiliary: No focal hepatic abnormality. Gallbladder unremarkable. Pancreas: No focal abnormality or ductal dilatation. Spleen: No focal abnormality.  Normal size. Adrenals/Urinary Tract: 6 mm stone in the lower pole of the left kidney, nonobstructing. No ureteral stones or hydronephrosis. Adrenal glands, right kidney and urinary bladder unremarkable. Stomach/Bowel: Few scattered sigmoid diverticula. No active diverticulitis. Normal appendix. Stomach and small bowel decompressed, no obstruction. Vascular/Lymphatic: Aortic atherosclerosis. No evidence of aneurysm or adenopathy. Reproductive: Prior hysterectomy.  No adnexal masses. Other: No free fluid or free air. Musculoskeletal: No acute bony abnormality. Degenerative changes in the lumbar spine. IMPRESSION: No acute findings in the abdomen or pelvis. Sigmoid diverticulosis. Left lower pole nephrolithiasis.  No hydronephrosis. Aortic atherosclerosis. Electronically Signed   By: Charlett Nose M.D.   On: 02/06/2022 18:17   ? ? ? ?Subjective: ?Seen and examined the day of discharge.  Stable no distress.  No abdominal pain.  Tolerating p.o. intake.  Stable for discharge home. ? ?Discharge Exam: ?Vitals:  ? 02/07/22 0509 02/07/22 0812  ?BP: (!) 113/58 125/63  ?Pulse: 77 80  ?Resp: 20 18  ?  Temp: 98 ?F (36.7 ?C) 98 ?F (36.7 ?C)  ?SpO2: 91% 97%  ? ?Vitals:  ? 02/06/22 2222 02/06/22 2304 02/07/22 0509 02/07/22 6440  ?BP: 110/89 125/64 (!) 113/58 125/63  ?Pulse: 78 72 77 80  ?Resp: 16 20 20 18   ?Temp: 97.9 ?F (36.6 ?C) 97.7 ?F (36.5 ?C) 98 ?F (36.7 ?C) 98 ?F (36.7 ?C)  ?TempSrc: Oral Oral Oral Oral  ?SpO2: 97% 98% 91% 97%  ?Weight:  98.1 kg    ?Height:  5\' 2"  (1.575 m)    ? ? ?General: Pt is alert, awake, not in acute distress ?Cardiovascular: RRR, S1/S2 +, no rubs, no gallops ?Respiratory: CTA bilaterally, no wheezing, no rhonchi ?Abdominal: Soft, NT, ND, bowel sounds + ?Extremities: no edema, no  cyanosis ? ? ? ?The results of significant diagnostics from this hospitalization (including imaging, microbiology, ancillary and laboratory) are listed below for reference.   ? ? ?Microbiology: ?Recent Results (from the past 240

## 2022-02-07 NOTE — Plan of Care (Signed)
?  Problem: Education: ?Goal: Knowledge of General Education information will improve ?Description: Including pain rating scale, medication(s)/side effects and non-pharmacologic comfort measures ?02/07/2022 1129 by Nyrie Sigal, Quitman Livings, RN ?Outcome: Adequate for Discharge ?02/07/2022 1101 by Brina Umeda, Quitman Livings, RN ?Outcome: Adequate for Discharge ?  ?Problem: Health Behavior/Discharge Planning: ?Goal: Ability to manage health-related needs will improve ?02/07/2022 1129 by Tyon Cerasoli, Quitman Livings, RN ?Outcome: Adequate for Discharge ?02/07/2022 1101 by Kenise Barraco, Quitman Livings, RN ?Outcome: Adequate for Discharge ?  ?Problem: Clinical Measurements: ?Goal: Ability to maintain clinical measurements within normal limits will improve ?02/07/2022 1129 by Monti Villers, Quitman Livings, RN ?Outcome: Adequate for Discharge ?02/07/2022 1101 by Rheana Casebolt, Quitman Livings, RN ?Outcome: Adequate for Discharge ?Goal: Will remain free from infection ?02/07/2022 1129 by Wilene Pharo, Quitman Livings, RN ?Outcome: Adequate for Discharge ?02/07/2022 1101 by Ceazia Harb, Quitman Livings, RN ?Outcome: Adequate for Discharge ?Goal: Diagnostic test results will improve ?02/07/2022 1129 by Theseus Birnie, Quitman Livings, RN ?Outcome: Adequate for Discharge ?02/07/2022 1101 by Zeenat Jeanbaptiste, Quitman Livings, RN ?Outcome: Adequate for Discharge ?Goal: Respiratory complications will improve ?02/07/2022 1129 by Kathrene Sinopoli, Quitman Livings, RN ?Outcome: Adequate for Discharge ?02/07/2022 1101 by Aliayah Tyer, Quitman Livings, RN ?Outcome: Adequate for Discharge ?Goal: Cardiovascular complication will be avoided ?02/07/2022 1129 by Kaitlen Redford, Quitman Livings, RN ?Outcome: Adequate for Discharge ?02/07/2022 1101 by Rowynn Mcweeney, Quitman Livings, RN ?Outcome: Adequate for Discharge ?  ?Problem: Activity: ?Goal: Risk for activity intolerance will decrease ?02/07/2022 1129 by Tanasia Budzinski, Quitman Livings, RN ?Outcome: Adequate for Discharge ?02/07/2022 1101 by Sania Noy, Quitman Livings, RN ?Outcome: Adequate for Discharge ?  ?Problem: Nutrition: ?Goal: Adequate nutrition will be  maintained ?02/07/2022 1129 by Kauri Garson, Quitman Livings, RN ?Outcome: Adequate for Discharge ?02/07/2022 1101 by Michal Strzelecki, Quitman Livings, RN ?Outcome: Adequate for Discharge ?  ?Problem: Coping: ?Goal: Level of anxiety will decrease ?02/07/2022 1129 by Trayven Lumadue, Quitman Livings, RN ?Outcome: Adequate for Discharge ?02/07/2022 1101 by Jawana Reagor, Quitman Livings, RN ?Outcome: Adequate for Discharge ?  ?Problem: Elimination: ?Goal: Will not experience complications related to bowel motility ?02/07/2022 1129 by Haydon Dorris, Quitman Livings, RN ?Outcome: Adequate for Discharge ?02/07/2022 1101 by Shira Bobst, Quitman Livings, RN ?Outcome: Adequate for Discharge ?  ?Problem: Pain Managment: ?Goal: General experience of comfort will improve ?02/07/2022 1129 by Jessica Seidman, Quitman Livings, RN ?Outcome: Adequate for Discharge ?02/07/2022 1101 by Anvita Hirata, Quitman Livings, RN ?Outcome: Adequate for Discharge ?  ?Problem: Safety: ?Goal: Ability to remain free from injury will improve ?02/07/2022 1129 by Danthony Kendrix, Quitman Livings, RN ?Outcome: Adequate for Discharge ?02/07/2022 1101 by Ruqayya Ventress, Quitman Livings, RN ?Outcome: Adequate for Discharge ?  ?Problem: Skin Integrity: ?Goal: Risk for impaired skin integrity will decrease ?02/07/2022 1129 by Crystalee Ventress, Quitman Livings, RN ?Outcome: Adequate for Discharge ?02/07/2022 1101 by Kiyoshi Schaab, Quitman Livings, RN ?Outcome: Adequate for Discharge ?  ?

## 2022-02-07 NOTE — TOC Initial Note (Signed)
Transition of Care (TOC) - Initial/Assessment Note  ? ? ?Patient Details  ?Name: Betty Luna ?MRN: [id number] ?Date of Birth: 1951/07/08 ? ?Transition of Care (TOC) CM/SW Contact:    ?Truddie Hidden, RN ?Phone Number: ?02/07/2022, 11:37 AM ? ?Clinical Narrative:                 ? ? ?Transition of Care (TOC) Screening Note ? ? ?Patient Details  ?Name: Betty Luna ?Date of Birth: June 16, 1951 ? ? ?Transition of Care (TOC) CM/SW Contact:    ?Truddie Hidden, RN ?Phone Number: ?02/07/2022, 11:37 AM ? ? ? ?Transition of Care Department [facility]) has reviewed patient and no TOC needs have been identified at this time. We will continue to monitor patient advancement through interdisciplinary progression rounds. If new patient transition needs arise, please place a TOC consult. ? ? ?  ?  ? ? ?Patient Goals and CMS Choice ?  ?  ?  ? ?Expected Discharge Plan and Services ?  ?  ?  ?  ?  ?Expected Discharge Date: 02/07/22               ?  ?  ?  ?  ?  ?  ?  ?  ?  ?  ? ?Prior Living Arrangements/Services ?  ?  ?  ?       ?  ?  ?  ?  ? ?Activities of Daily Living ?Home Assistive Devices/Equipment: None ?ADL Screening (condition at time of admission) ?Patient's cognitive ability adequate to safely complete daily activities?: Yes ?Is the patient deaf or have difficulty hearing?: No ?Does the patient have difficulty seeing, even when wearing glasses/contacts?: No ?Does the patient have difficulty concentrating, remembering, or making decisions?: No ?Patient able to express need for assistance with ADLs?: No ?Does the patient have difficulty dressing or bathing?: No ?Independently performs ADLs?: Yes (appropriate for developmental age) ?Does the patient have difficulty walking or climbing stairs?: No ?Weakness of Legs: None ?Weakness of Arms/Hands: None ? ?Permission Sought/Granted ?  ?  ?   ?   ?   ?   ? ?Emotional Assessment ?  ?  ?  ?  ?  ?  ? ?Admission diagnosis:  Acute gastroenteritis [K52.9] ?Lactic acidosis [E87.20] ?Diarrhea,  unspecified type [R19.7] ?Patient Active Problem List  ? Diagnosis Date Noted  ? Acute gastroenteritis 02/06/2022  ? AKI (acute kidney injury) (HCC) 02/06/2022  ? Lactic acidosis 02/06/2022  ? Coronary artery calcification   ? Microcytic anemia 07/22/2021  ? Bacteremia due to Gram-negative bacteria 07/20/2021  ? Sepsis due to gram-negative UTI (HCC) 07/19/2021  ? Sepsis secondary to UTI (HCC) 07/19/2021  ? Chronic systolic CHF (congestive heart failure) (HCC) 04/03/2021  ? Cardiomyopathy (HCC) 04/03/2021  ? Contusion of left hip 04/03/2021  ? Accidental fall 04/03/2021  ? Alcohol use 04/03/2021  ? Closed fracture of base of fifth metatarsal bone   ? Congestive heart failure, unspecified HF chronicity, unspecified heart failure type (HCC) 12/29/2020  ? Type 2 diabetes mellitus without complication, with long-term current use of insulin (HCC) 12/29/2020  ? Shortness of breath 06/22/2019  ? Uncontrolled hypertension 06/22/2019  ? SOB (shortness of breath) 06/22/2019  ? Pyelonephritis 06/26/2017  ? Uncontrolled type 2 diabetes mellitus with hyperglycemia, without long-term current use of insulin (HCC) 08/08/2015  ? HTN (hypertension), benign 03/21/2014  ? ?PCP:  Ethelda Chick, MD ?Pharmacy:   ?[id number] [place], [place] - [address] AT [address] ?[address] ?[place] [address] ?Phone: [phone] Fax: [phone] ? ? ? ? ?Social Determinants of Health (SDOH) Interventions ?  ? ?Readmission Risk Interventions ?   ? View : No data to display.  ?  ?  ?  ? ? ? ?

## 2022-02-07 NOTE — Progress Notes (Signed)
MD order for pt to follow soft diet and have bland food with plenty of fluids - pt to avoid fried, rich, and high fat foods.  Education provided.  Pt verbalized understanding.   ? ?

## 2022-02-07 NOTE — Plan of Care (Signed)
  Problem: Education: Goal: Knowledge of General Education information will improve Description: Including pain rating scale, medication(s)/side effects and non-pharmacologic comfort measures Outcome: Adequate for Discharge   Problem: Health Behavior/Discharge Planning: Goal: Ability to manage health-related needs will improve Outcome: Adequate for Discharge   Problem: Clinical Measurements: Goal: Ability to maintain clinical measurements within normal limits will improve Outcome: Adequate for Discharge Goal: Will remain free from infection Outcome: Adequate for Discharge Goal: Diagnostic test results will improve Outcome: Adequate for Discharge Goal: Respiratory complications will improve Outcome: Adequate for Discharge Goal: Cardiovascular complication will be avoided Outcome: Adequate for Discharge   Problem: Activity: Goal: Risk for activity intolerance will decrease Outcome: Adequate for Discharge   Problem: Nutrition: Goal: Adequate nutrition will be maintained Outcome: Adequate for Discharge   Problem: Coping: Goal: Level of anxiety will decrease Outcome: Adequate for Discharge   Problem: Elimination: Goal: Will not experience complications related to bowel motility Outcome: Adequate for Discharge   Problem: Pain Managment: Goal: General experience of comfort will improve Outcome: Adequate for Discharge   Problem: Safety: Goal: Ability to remain free from injury will improve Outcome: Adequate for Discharge   Problem: Skin Integrity: Goal: Risk for impaired skin integrity will decrease Outcome: Adequate for Discharge   

## 2022-02-09 LAB — URINE CULTURE: Culture: 80000 — AB

## 2022-02-11 LAB — CULTURE, BLOOD (ROUTINE X 2)
Culture: NO GROWTH
Culture: NO GROWTH
Special Requests: ADEQUATE

## 2022-02-12 ENCOUNTER — Emergency Department
Admission: EM | Admit: 2022-02-12 | Discharge: 2022-02-13 | Disposition: A | Discharge status: Home / Self Care | Payer: Medicare Other | Attending: Emergency Medicine | Admitting: Emergency Medicine

## 2022-02-12 DIAGNOSIS — I2510 Atherosclerotic heart disease of native coronary artery without angina pectoris: Secondary | ICD-10-CM | POA: Insufficient documentation

## 2022-02-12 DIAGNOSIS — I951 Orthostatic hypotension: Secondary | ICD-10-CM | POA: Insufficient documentation

## 2022-02-12 DIAGNOSIS — I110 Hypertensive heart disease with heart failure: Secondary | ICD-10-CM | POA: Insufficient documentation

## 2022-02-12 DIAGNOSIS — I509 Heart failure, unspecified: Secondary | ICD-10-CM | POA: Insufficient documentation

## 2022-02-12 DIAGNOSIS — R42 Dizziness and giddiness: Secondary | ICD-10-CM | POA: Diagnosis present

## 2022-02-12 DIAGNOSIS — E119 Type 2 diabetes mellitus without complications: Secondary | ICD-10-CM | POA: Diagnosis not present

## 2022-02-12 LAB — URINALYSIS, ROUTINE W REFLEX MICROSCOPIC
Bilirubin Urine: NEGATIVE
Glucose, UA: NEGATIVE mg/dL
Hgb urine dipstick: NEGATIVE
Ketones, ur: NEGATIVE mg/dL
Leukocytes,Ua: NEGATIVE
Nitrite: NEGATIVE
Protein, ur: NEGATIVE mg/dL
Specific Gravity, Urine: 1.01 (ref 1.005–1.030)
pH: 5 (ref 5.0–8.0)

## 2022-02-12 LAB — CBC
HCT: 41.6 % (ref 36.0–46.0)
Hemoglobin: 12.9 g/dL (ref 12.0–15.0)
MCH: 23.2 pg — ABNORMAL LOW (ref 26.0–34.0)
MCHC: 31 g/dL (ref 30.0–36.0)
MCV: 74.8 fL — ABNORMAL LOW (ref 80.0–100.0)
Platelets: 256 10*3/uL (ref 150–400)
RBC: 5.56 MIL/uL — ABNORMAL HIGH (ref 3.87–5.11)
RDW: 14.8 % (ref 11.5–15.5)
WBC: 6.9 10*3/uL (ref 4.0–10.5)
nRBC: 0 % (ref 0.0–0.2)

## 2022-02-12 LAB — BASIC METABOLIC PANEL
Anion gap: 10 (ref 5–15)
BUN: 34 mg/dL — ABNORMAL HIGH (ref 8–23)
CO2: 24 mmol/L (ref 22–32)
Calcium: 9.9 mg/dL (ref 8.9–10.3)
Chloride: 100 mmol/L (ref 98–111)
Creatinine, Ser: 1.43 mg/dL — ABNORMAL HIGH (ref 0.44–1.00)
GFR, Estimated: 39 mL/min — ABNORMAL LOW (ref >60)
Glucose, Bld: 191 mg/dL — ABNORMAL HIGH (ref 70–99)
Potassium: 5.1 mmol/L (ref 3.5–5.1)
Sodium: 134 mmol/L — ABNORMAL LOW (ref 135–145)

## 2022-02-12 LAB — HEPATIC FUNCTION PANEL
ALT: 47 U/L — ABNORMAL HIGH (ref 0–44)
AST: 40 U/L (ref 15–41)
Albumin: 4 g/dL (ref 3.5–5.0)
Alkaline Phosphatase: 79 U/L (ref 38–126)
Bilirubin, Direct: 0.1 mg/dL (ref 0.0–0.2)
Indirect Bilirubin: 0.5 mg/dL (ref 0.3–0.9)
Total Bilirubin: 0.6 mg/dL (ref 0.3–1.2)
Total Protein: 7.4 g/dL (ref 6.5–8.1)

## 2022-02-12 LAB — TROPONIN I (HIGH SENSITIVITY): Troponin I (High Sensitivity): 8 ng/L (ref <18)

## 2022-02-12 MED ORDER — SODIUM CHLORIDE 0.9 % IV BOLUS: 1000.0000 mL | Freq: Once | INTRAVENOUS | Status: DC

## 2022-02-12 MED ORDER — SODIUM CHLORIDE 0.9 % IV BOLUS
500.0000 mL | Freq: Once | INTRAVENOUS | Status: AC
  Administered 2022-02-12: 500 mL via INTRAVENOUS

## 2022-02-12 NOTE — ED Provider Notes (Signed)
? ?[facility] ?Provider Note ? ? ? Event Date/Time  ? First MD Initiated Contact with Patient 02/12/22 2140   ?  (approximate) ? ? ?History  ? ?Dizziness ? ? ?HPI ? ?Betty Luna is a 71 y.o. female here with transient lightheadedness.  The patient states that she was standing in the kitchen earlier today talking to family when she began to feel mildly lightheaded.  She sat down and symptoms resolved.  She has recently been hospitalized for nausea, vomiting, and decreased appetite with diarrhea.  This was thought secondary to a viral illness.  She states that she has had continued decreased appetite, though her vomiting and diarrhea are slowly improving.  She states she otherwise feels well.  She feels better after sitting down.  Denies any focal numbness or weakness.  No chest pain.  No palpitations.  She did not feel short of breath.  Of note, she did start an long-acting diabetic medication prior to the onset of her GI symptoms.  She also has continued taking her torsemide and other medications regularly. ?  ? ? ?Physical Exam  ? ?Triage Vital Signs: ?ED Triage Vitals  ?Enc Vitals Group  ?   BP 02/12/22 1910 102/60  ?   Pulse Rate 02/12/22 1910 90  ?   Resp 02/12/22 1910 19  ?   Temp 02/12/22 1910 98.3 ?F (36.8 ?C)  ?   Temp Source 02/12/22 1910 Oral  ?   SpO2 02/12/22 1910 95 %  ?   Weight 02/12/22 1911 215 lb (97.5 kg)  ?   Height 02/12/22 1911 5\' 2"  (1.575 m)  ?   Head Circumference --   ?   Peak Flow --   ?   Pain Score 02/12/22 1910 8  ?   Pain Loc --   ?   Pain Edu? --   ?   Excl. in [place]? --   ? ? ?Most recent vital signs: ?Vitals:  ? 02/13/22 0005 02/13/22 0020  ?BP: 125/64   ?Pulse: 80   ?Resp: 16   ?Temp:  97.8 ?F (36.6 ?C)  ?SpO2: 97%   ? ? ? ?General: Awake, no distress.  ?CV:  Good peripheral perfusion.  Regular rate and rhythm. ?Resp:  Normal effort.  Lungs clear to auscultation bilaterally. ?Abd:  No distention.  No tenderness. ?Other:  Mildly dry mucous membranes.  No  focal neurological deficits. ? ? ?ED Results / Procedures / Treatments  ? ?Labs ?(all labs ordered are listed, but only abnormal results are displayed) ?Labs Reviewed  ?BASIC METABOLIC PANEL - Abnormal; Notable for the following components:  ?    Result Value  ? Sodium 134 (*)   ? Glucose, Bld 191 (*)   ? BUN 34 (*)   ? Creatinine, Ser 1.43 (*)   ? GFR, Estimated 39 (*)   ? All other components within normal limits  ?CBC - Abnormal; Notable for the following components:  ? RBC 5.56 (*)   ? MCV 74.8 (*)   ? MCH 23.2 (*)   ? All other components within normal limits  ?URINALYSIS, ROUTINE W REFLEX MICROSCOPIC - Abnormal; Notable for the following components:  ? Color, Urine YELLOW (*)   ? APPearance CLEAR (*)   ? All other components within normal limits  ?HEPATIC FUNCTION PANEL - Abnormal; Notable for the following components:  ? ALT 47 (*)   ? All other components within normal limits  ?TROPONIN I (HIGH SENSITIVITY)  ? ? ? ?  EKG ?Normal sinus rhythm, ventricular rate 89.  PR 186, QRS 120, QTc 469.  LVH with repol.  Morphology is overall similar to prior. ? ? ?RADIOLOGY ? ? ? ?I also independently reviewed and agree wit radiologist interpretations. ? ? ?PROCEDURES: ? ?Critical Care performed: No ? ?.1-3 Lead EKG Interpretation ?Performed by: Duffy Bruce, MD ?Authorized by: Duffy Bruce, MD  ? ?  Interpretation: normal   ?  ECG rate:  80-90 ?  ECG rate assessment: normal   ?  Rhythm: sinus rhythm   ?  Ectopy: none   ?  Conduction: normal   ?Comments:  ?   Indication: weakness ? ? ? ?MEDICATIONS ORDERED IN ED: ?Medications  ?sodium chloride 0.9 % bolus 500 mL (0 mLs Intravenous Stopped 02/13/22 0004)  ? ? ? ?IMPRESSION / MDM / ASSESSMENT AND PLAN / ED COURSE  ?I reviewed the triage vital signs and the nursing notes. ?             ?               ? ? ?The patient is on the cardiac monitor to evaluate for evidence of arrhythmia and/or significant heart rate changes. ? ? ?Ddx:  ?Orthostasis, dehydration, arrhythmia,  anemia, vertigo ? ? ?MDM:  ?71 yo F with PMHx HTN, CAD, CHF, DM, recent admission for dehydration 2/2 viral GI illness here with transient dizziness while standing. Pt orthostatic here and clinically appears dehydrated. Pt given fluids with marked improvement. Otherwise, she is very well appearing. No focal neuro deficits or sx to suggest CVA. CBC without leukocytosis or anemia. Her BMP does show persistent slight elevation in BUN/Cr above baseline, c/w dehydration. EKG nonischemic, trop negative, and no arrhythmia noted on telemetry. ? ?Suspect pt has ongoing poor PO intake, whether this is viral GI related or possibly 2/2 her recent initiation of a once weekly DM medication. This, along with continued torsemide use, is likely contributing to her dehydration/pre-renal status and lightheadedness. Will ask her to hold her torsemide x 1-2 days, call her PCP, and encouraged PO hydration.  ? ?Return precautions given.  ? ? ?MEDICATIONS GIVEN IN ED: ?Medications  ?sodium chloride 0.9 % bolus 500 mL (0 mLs Intravenous Stopped 02/13/22 0004)  ? ? ? ?Consults:  ?None ? ? ?EMR reviewed  ?Recent H&P and discharge summary reviewed extensively ? ? ? ? ? ?FINAL CLINICAL IMPRESSION(S) / ED DIAGNOSES  ? ?Final diagnoses:  ?Orthostasis  ? ? ? ?Rx / DC Orders  ? ?ED Discharge Orders   ? ? None  ? ?  ? ? ? ?Note:  This document was prepared using Dragon voice recognition software and may include unintentional dictation errors. ?  ?Duffy Bruce, MD ?02/13/22 1218 ? ?

## 2022-02-12 NOTE — ED Notes (Signed)
Patient ambulated to and from restroom.  Minimal assistance needed.  No reports of dizziness.  Steady gait noted ?

## 2022-02-12 NOTE — ED Notes (Signed)
Patient updated on plan of care.  No complaints voiced at this time.  ?

## 2022-02-12 NOTE — Discharge Instructions (Signed)
STARTING TOMORROW: ? ?- HOLD/DO NOT TAKE your torsemide for the next 2 days ?- CALL your primary doctor to discuss your ER visits and medication changes. You should see them THIS WEEK for follow-up. ?- DO NOT take the Injectable Diabetic medication until OK'ed by your primary, as this could be causing your symptoms ? ? ?

## 2022-02-12 NOTE — ED Notes (Signed)
Patient had no reports of dizziness when standing ?

## 2022-02-12 NOTE — ED Triage Notes (Signed)
Pt was seen last Monday for GI symptoms and is back today for a headache and dizziness. Pt states she began having dizziness at home and checked her BP and it was 90 systolic.  ?

## 2022-04-23 ENCOUNTER — Other Ambulatory Visit: Payer: Self-pay | Admitting: *Deleted

## 2022-04-23 MED ORDER — SPIRONOLACTONE 25 MG PO TABS: 25.0000 mg | ORAL_TABLET | Freq: Every day | ORAL | 2 refills | Status: DC

## 2022-06-01 ENCOUNTER — Ambulatory Visit (INDEPENDENT_AMBULATORY_CARE_PROVIDER_SITE_OTHER): Payer: Medicare Other

## 2022-06-01 DIAGNOSIS — I428 Other cardiomyopathies: Secondary | ICD-10-CM

## 2022-06-01 LAB — ECHOCARDIOGRAM COMPLETE
AR max vel: 1.98 cm2
AV Area VTI: 1.68 cm2
AV Area mean vel: 2.07 cm2
AV Mean grad: 3 mmHg
AV Peak grad: 5.4 mmHg
Ao pk vel: 1.16 m/s
Area-P 1/2: 2.67 cm2
Calc EF: 29.7 %
S' Lateral: 4.7 cm
Single Plane A2C EF: 27.6 %
Single Plane A4C EF: 32.2 %

## 2022-06-01 MED ORDER — PERFLUTREN LIPID MICROSPHERE
1.0000 mL | INTRAVENOUS | Status: AC | PRN
  Administered 2022-06-01: 2 mL via INTRAVENOUS

## 2022-07-02 ENCOUNTER — Telehealth: Payer: Self-pay | Admitting: Cardiology

## 2022-07-02 ENCOUNTER — Ambulatory Visit: Payer: Medicare Other | Attending: Cardiology | Admitting: Cardiology

## 2022-07-02 ENCOUNTER — Encounter: Payer: Self-pay | Admitting: Cardiology

## 2022-07-02 ENCOUNTER — Telehealth: Payer: Self-pay

## 2022-07-02 VITALS — BP 120/70 | HR 80 | Ht 62.0 in | Wt 218.4 lb

## 2022-07-02 DIAGNOSIS — I2510 Atherosclerotic heart disease of native coronary artery without angina pectoris: Secondary | ICD-10-CM

## 2022-07-02 DIAGNOSIS — Z6839 Body mass index (BMI) 39.0-39.9, adult: Secondary | ICD-10-CM

## 2022-07-02 DIAGNOSIS — I428 Other cardiomyopathies: Secondary | ICD-10-CM | POA: Diagnosis not present

## 2022-07-02 DIAGNOSIS — E7800 Pure hypercholesterolemia, unspecified: Secondary | ICD-10-CM

## 2022-07-02 MED ORDER — BEMPEDOIC ACID 180 MG PO TABS: 180.0000 mg | ORAL_TABLET | Freq: Every day | ORAL | 5 refills | Status: DC

## 2022-07-02 MED ORDER — ENTRESTO 97-103 MG PO TABS: 1.0000 | ORAL_TABLET | Freq: Two times a day (BID) | ORAL | 5 refills | Status: DC

## 2022-07-02 MED ORDER — CARVEDILOL 12.5 MG PO TABS: 12.5000 mg | ORAL_TABLET | Freq: Two times a day (BID) | ORAL | 5 refills | Status: DC

## 2022-07-02 NOTE — Telephone Encounter (Signed)
  Pt c/o medication issue:  1. Name of Medication: new medications  2. How are you currently taking this medication (dosage and times per day)?   3. Are you having a reaction (difficulty breathing--STAT)? No   4. What is your medication issue? Pt said, she is reading her AVS for her medications and she said she cant understand it. She would like to get a call from nurse to discuss

## 2022-07-02 NOTE — Telephone Encounter (Signed)
Prior authorization initiated by [email or web address] for nexletol 180 mg tablets. KEY: [id number] Response: [employer] is reviewing your PA request. Typically an electronic response will be received within 24-72 hours.

## 2022-07-02 NOTE — Progress Notes (Signed)
Cardiology Office Note:    Date:  07/02/2022   ID:  Betty Luna, DOB Sep 02, 1951, MRN [id number]  PCP:  Ethelda Chick, MD   [facility] Providers Cardiologist:  Debbe Odea, MD     Referring MD: Ethelda Chick, MD   Chief Complaint  Patient presents with   Other    5 month f/u c/o occasional chest pain. Meds reviewed verbally with pt.    History of Present Illness:    Betty Luna is a 71 y.o. female with a hx of nonischemic cardiomyopathy EF 35 to 40%, hypertension, hyperlipidemia who presents for follow-up.   Being seen for nonischemic cardiomyopathy and medication titration.  Had a recent/repeat echo showing moderate to severely reduced function, slightly worse from prior.  Takes medications as prescribed.  Repatha was tried, patient did not tolerate.  Currently on Zetia only.  Denies edema, denies shortness of breath.  Ozempic previously tried, patient did not feel well while taking.   Prior notes Left heart cath 12/2021, 20% mid LAD. Echo 12/2021 showed EF 35 to 40% Lexiscan Myoview 12/06/2021 no significant ischemia, mild LAD calcifications, EF 24%  Echo 06/2021 EF 50 to 55%.  Past Medical History:  Diagnosis Date   Anemia    Arthritis    CHF (congestive heart failure) (HCC)    Depression    Diabetes mellitus without complication (HCC)    GERD (gastroesophageal reflux disease)    Gout    Hypertension     Past Surgical History:  Procedure Laterality Date   ABDOMINAL HYSTERECTOMY     CATARACT EXTRACTION W/PHACO Right 04/16/2017   Procedure: CATARACT EXTRACTION PHACO AND INTRAOCULAR LENS PLACEMENT (IOC);  Surgeon: Galen Manila, MD;  Location: [facility] ORS;  Service: Ophthalmology;  Laterality: Right;  [place] 01:06 AP% 16.5 CDE 11.03 Fluid pack lot # [id number]   CATARACT EXTRACTION W/PHACO Left 10/01/2017   Procedure: CATARACT EXTRACTION PHACO AND INTRAOCULAR LENS PLACEMENT (IOC)-LEFT DIABETIC;  Surgeon: Galen Manila, MD;  Location: [facility] ORS;   Service: Ophthalmology;  Laterality: Left;  [place] 01:05.7 AP% 12.2 CDE 7.42 Fluid pack lot # [id number]   CTR Left    EYE SURGERY Left    RIGHT/LEFT HEART CATH AND CORONARY ANGIOGRAPHY Bilateral 01/01/2022   Procedure: RIGHT/LEFT HEART CATH AND CORONARY ANGIOGRAPHY;  Surgeon: Iran Ouch, MD;  Location: [facility] INVASIVE CV LAB;  Service: Cardiovascular;  Laterality: Bilateral;   TONSILLECTOMY      Current Medications: Current Meds  Medication Sig   acetaminophen (TYLENOL) 500 MG tablet Take 500 mg by mouth every 6 (six) hours as needed for moderate pain.   aspirin EC 81 MG tablet Take 81 mg by mouth daily.   Bempedoic Acid 180 MG TABS Take 180 mg by mouth daily.   carvedilol (COREG) 12.5 MG tablet Take 1 tablet (12.5 mg total) by mouth 2 (two) times daily.   colchicine 0.6 MG tablet Take 0.6 mg by mouth daily.   escitalopram (LEXAPRO) 20 MG tablet Take 20 mg by mouth daily.   ezetimibe (ZETIA) 10 MG tablet Take 1 tablet (10 mg total) by mouth daily.   ferrous gluconate (FERGON) 324 MG tablet Take 1 tablet (324 mg total) by mouth daily with breakfast.   hydrOXYzine (ATARAX) 25 MG tablet Take 25 mg by mouth 2 (two) times daily as needed for anxiety.   LANTUS SOLOSTAR 100 UNIT/ML Solostar Pen Inject 38 Units into the skin daily.   metFORMIN (GLUCOPHAGE) 500 MG tablet Take 1,000 mg by mouth 2 (two) times daily.  ondansetron (ZOFRAN) 4 MG tablet Take 1 tablet (4 mg total) by mouth daily as needed for nausea or vomiting.   pantoprazole (PROTONIX) 40 MG tablet Take 40 mg by mouth daily.   pregabalin (LYRICA) 100 MG capsule Take 100 mg by mouth 3 (three) times daily.   sacubitril-valsartan (ENTRESTO) 97-103 MG Take 1 tablet by mouth 2 (two) times daily.   spironolactone (ALDACTONE) 25 MG tablet Take 1 tablet (25 mg total) by mouth daily. STOP taking your Amlodipine   torsemide (DEMADEX) 20 MG tablet Take 20 mg by mouth daily.   [DISCONTINUED] carvedilol (COREG) 25 MG tablet Take 12.5 mg by  mouth 2 (two) times daily.   [DISCONTINUED] sacubitril-valsartan (ENTRESTO) 49-51 MG Take 1 tablet by mouth 2 (two) times daily.     Allergies:   Omeprazole, Tramadol, Atorvastatin, and Codeine   Social History   Socioeconomic History   Marital status: Married    Spouse name: Not on file   Number of children: Not on file   Years of education: Not on file   Highest education level: Not on file  Occupational History   Not on file  Tobacco Use   Smoking status: Former   Smokeless tobacco: Current    Types: Snuff  Vaping Use   Vaping Use: Never used  Substance and Sexual Activity   Alcohol use: Yes    Comment: occasonally   Drug use: No   Sexual activity: Not on file  Other Topics Concern   Not on file  Social History Narrative   Not on file   Social Determinants of Health   Financial Resource Strain: Not on file  Food Insecurity: Not on file  Transportation Needs: Not on file  Physical Activity: Not on file  Stress: Not on file  Social Connections: Not on file     Family History: The patient's family history includes Alcohol abuse in her brother; Breast cancer in her sister.  ROS:   Please see the history of present illness.     All other systems reviewed and are negative.  EKGs/Labs/Other Studies Reviewed:    The following studies were reviewed today:   EKG:  EKG is ordered today.  EKG shows normal sinus rhythm, left bundle branch block.  Recent Labs: 07/20/2021: Magnesium 2.0 02/12/2022: ALT 47; BUN 34; Creatinine, Ser 1.43; Hemoglobin 12.9; Platelets 256; Potassium 5.1; Sodium 134  Recent Lipid Panel    Component Value Date/Time   CHOL 203 (H) 12/29/2021 1115   TRIG 133 12/29/2021 1115   HDL 53 12/29/2021 1115   CHOLHDL 3.8 12/29/2021 1115   LDLCALC 126 (H) 12/29/2021 1115     Risk Assessment/Calculations:          Physical Exam:    VS:  BP 120/70 (BP Location: Left Arm, Patient Position: Sitting, Cuff Size: Normal)   Pulse 80   Ht 5\' 2"   (1.575 m)   Wt 218 lb 6 oz (99.1 kg)   SpO2 96%   BMI 39.94 kg/m     Wt Readings from Last 3 Encounters:  07/02/22 218 lb 6 oz (99.1 kg)  02/12/22 215 lb (97.5 kg)  02/06/22 216 lb 4.3 oz (98.1 kg)     GEN:  Well nourished, well developed in no acute distress HEENT: Normal NECK: No JVD; No carotid bruits CARDIAC: RRR, no murmurs, rubs, gallops RESPIRATORY:  Clear to auscultation without rales, wheezing or rhonchi  ABDOMEN: Soft, non-tender, non-distended MUSCULOSKELETAL:  No edema; No deformity  SKIN: Warm and dry NEUROLOGIC:  Alert and oriented x 3 PSYCHIATRIC:  Normal affect   ASSESSMENT:    1. NICM (nonischemic cardiomyopathy) (HCC)   2. BMI 39.0-39.9,adult   3. Coronary artery disease involving native coronary artery of native heart without angina pectoris   4. Pure hypercholesterolemia    PLAN:    In order of problems listed above:  Nonischemic cardiomyopathy initial EF 35 to 40%.  Repeat echo EF 30 to 35% appears euvolemic.  Reduce Coreg to 12.5 mg twice daily, increase Entresto to 97-103 mg twice daily.  Continue Aldactone 25 mg daily.  NYHA class II-III symptoms.  Continue torsemide 20 mg daily.   Obesity, low-calorie diet, weight loss advised.  Did not tolerate Ozempic previously. Nonobstructive CAD, 20% mid LAD, continue aspirin 81 mg, continue Zetia, start bempedoic acid. Hyperlipidemia, start bempedoic acid, continue Zetia.    Follow-up in 6 to 8 weeks for medication titration.      Medication Adjustments/Labs and Tests Ordered: Current medicines are reviewed at length with the patient today.  Concerns regarding medicines are outlined above.  Orders Placed This Encounter  Procedures   EKG 12-Lead   Meds ordered this encounter  Medications   carvedilol (COREG) 12.5 MG tablet    Sig: Take 1 tablet (12.5 mg total) by mouth 2 (two) times daily.    Dispense:  60 tablet    Refill:  5   sacubitril-valsartan (ENTRESTO) 97-103 MG    Sig: Take 1 tablet by  mouth 2 (two) times daily.    Dispense:  60 tablet    Refill:  5   Bempedoic Acid 180 MG TABS    Sig: Take 180 mg by mouth daily.    Dispense:  30 tablet    Refill:  5    Patient Instructions  Medication Instructions:   Your physician has recommended you make the following change in your medication:    DECREASE your Carvedilol to 12.5 MG twice a day.  2.    INCREASE your Entresto to 97-103 MG twice a day.  3.    START taking Bempedoic Acid 180 MG once a day.  *If you need a refill on your cardiac medications before your next appointment, please call your pharmacy*    Follow-Up: At [facility], you and your health needs are our priority.  As part of our continuing mission to provide you with exceptional heart care, we have created designated Provider Care Teams.  These Care Teams include your primary Cardiologist (physician) and Advanced Practice Providers (APPs -  Physician Assistants and Nurse Practitioners) who all work together to provide you with the care you need, when you need it.  We recommend signing up for the patient portal called "MyChart".  Sign up information is provided on this After Visit Summary.  MyChart is used to connect with patients for Virtual Visits (Telemedicine).  Patients are able to view lab/test results, encounter notes, upcoming appointments, etc.  Non-urgent messages can be sent to your provider as well.   To learn more about what you can do with MyChart, go to [email or web address].    Your next appointment:   2 month(s)  The format for your next appointment:   In Person  Provider:   You may see Debbe Odea, MD or one of the following Advanced Practice Providers on your designated Care Team:   Nicolasa Ducking, NP Eula Listen, PA-C Cadence Fransico Michael, PA-C Charlsie Quest, NP      Important Information About Sugar  Signed, Debbe Odea, MD  07/02/2022 10:26 AM    [facility]

## 2022-07-02 NOTE — Patient Instructions (Signed)
Medication Instructions:   Your physician has recommended you make the following change in your medication:    DECREASE your Carvedilol to 12.5 MG twice a day.  2.    INCREASE your Entresto to 97-103 MG twice a day.  3.    START taking Bempedoic Acid 180 MG once a day.  *If you need a refill on your cardiac medications before your next appointment, please call your pharmacy*    Follow-Up: At [facility], you and your health needs are our priority.  As part of our continuing mission to provide you with exceptional heart care, we have created designated Provider Care Teams.  These Care Teams include your primary Cardiologist (physician) and Advanced Practice Providers (APPs -  Physician Assistants and Nurse Practitioners) who all work together to provide you with the care you need, when you need it.  We recommend signing up for the patient portal called "MyChart".  Sign up information is provided on this After Visit Summary.  MyChart is used to connect with patients for Virtual Visits (Telemedicine).  Patients are able to view lab/test results, encounter notes, upcoming appointments, etc.  Non-urgent messages can be sent to your provider as well.   To learn more about what you can do with MyChart, go to [email or web address].    Your next appointment:   2 month(s)  The format for your next appointment:   In Person  Provider:   You may see Debbe Odea, MD or one of the following Advanced Practice Providers on your designated Care Team:   Nicolasa Ducking, NP Eula Listen, PA-C Cadence Fransico Michael, PA-C Charlsie Quest, NP      Important Information About Sugar

## 2022-07-02 NOTE — Telephone Encounter (Signed)
Called patient and reviewed medication instructions with her. She verbalized understanding and agreed with plan.

## 2022-07-05 NOTE — Telephone Encounter (Signed)
Request Reference Number: [id number]. NEXLETOL TAB 180MG  is approved through 10/21/2022. Your patient may now fill this prescription and it will be covered.

## 2022-07-16 ENCOUNTER — Emergency Department: Payer: Medicare Other

## 2022-07-16 ENCOUNTER — Emergency Department
Admission: EM | Admit: 2022-07-16 | Discharge: 2022-07-16 | Disposition: A | Discharge status: Home / Self Care | Payer: Medicare Other | Attending: Emergency Medicine | Admitting: Emergency Medicine

## 2022-07-16 ENCOUNTER — Other Ambulatory Visit: Payer: Self-pay

## 2022-07-16 DIAGNOSIS — W1839XA Other fall on same level, initial encounter: Secondary | ICD-10-CM | POA: Diagnosis not present

## 2022-07-16 DIAGNOSIS — I509 Heart failure, unspecified: Secondary | ICD-10-CM | POA: Insufficient documentation

## 2022-07-16 DIAGNOSIS — E119 Type 2 diabetes mellitus without complications: Secondary | ICD-10-CM | POA: Diagnosis not present

## 2022-07-16 DIAGNOSIS — I110 Hypertensive heart disease with heart failure: Secondary | ICD-10-CM | POA: Diagnosis not present

## 2022-07-16 DIAGNOSIS — W19XXXA Unspecified fall, initial encounter: Secondary | ICD-10-CM

## 2022-07-16 DIAGNOSIS — M25552 Pain in left hip: Secondary | ICD-10-CM

## 2022-07-16 LAB — BASIC METABOLIC PANEL
Anion gap: 12 (ref 5–15)
BUN: 21 mg/dL (ref 8–23)
CO2: 26 mmol/L (ref 22–32)
Calcium: 9.4 mg/dL (ref 8.9–10.3)
Chloride: 102 mmol/L (ref 98–111)
Creatinine, Ser: 0.9 mg/dL (ref 0.44–1.00)
GFR, Estimated: >60 mL/min (ref >60)
Glucose, Bld: 112 mg/dL — ABNORMAL HIGH (ref 70–99)
Potassium: 3.8 mmol/L (ref 3.5–5.1)
Sodium: 140 mmol/L (ref 135–145)

## 2022-07-16 LAB — CBC
HCT: 35 % — ABNORMAL LOW (ref 36.0–46.0)
Hemoglobin: 11.1 g/dL — ABNORMAL LOW (ref 12.0–15.0)
MCH: 23.4 pg — ABNORMAL LOW (ref 26.0–34.0)
MCHC: 31.7 g/dL (ref 30.0–36.0)
MCV: 73.8 fL — ABNORMAL LOW (ref 80.0–100.0)
Platelets: 198 10*3/uL (ref 150–400)
RBC: 4.74 MIL/uL (ref 3.87–5.11)
RDW: 16.5 % — ABNORMAL HIGH (ref 11.5–15.5)
WBC: 8 10*3/uL (ref 4.0–10.5)
nRBC: 0 % (ref 0.0–0.2)

## 2022-07-16 MED ORDER — FENTANYL CITRATE PF 50 MCG/ML IJ SOSY
50.0000 ug | PREFILLED_SYRINGE | Freq: Once | INTRAMUSCULAR | Status: AC
  Administered 2022-07-16: 50 ug via INTRAVENOUS
  Filled 2022-07-16: qty 1

## 2022-07-16 MED ORDER — ACETAMINOPHEN 500 MG PO TABS
1000.0000 mg | ORAL_TABLET | Freq: Once | ORAL | Status: AC
  Administered 2022-07-16: 1000 mg via ORAL
  Filled 2022-07-16: qty 2

## 2022-07-16 NOTE — ED Notes (Signed)
Patient transported to CT 

## 2022-07-16 NOTE — ED Notes (Signed)
Patient transported to X-ray 

## 2022-07-16 NOTE — ED Notes (Signed)
DC status noted.  Pt continues to rate pain 8-9/10, second dose of pain med given IVP.  Spouse at bedside in wheelchair.  Pt reports that he can drive but usually doesn't.  Denies having any family members to call.  Pt positioned for comfort with call light in reach, rails up and bed in low locked position.  Warm blankets provided for both pt and spouse.  Will DC when pain level has decreased to bearable.

## 2022-07-16 NOTE — ED Notes (Signed)
Use of call light to let this RN know she was in room.  Introduced myself and Warm blankets provided.

## 2022-07-16 NOTE — ED Triage Notes (Signed)
Pt states her husband fell tonight and she was trying to help him get up when she fell injuring left hip. Pt states happened arounc 2200 yesterday.

## 2022-07-16 NOTE — ED Provider Notes (Signed)
[facility] Provider Note    Event Date/Time   First MD Initiated Contact with Patient 07/16/22 0153     (approximate)  History   Chief Complaint: Fall  HPI  Charonda Hefter is a 71 y.o. female with a past medical history of CHF, diabetes, gastric reflux, hypertension, presents emergency department after a fall.  According to the patient her husband fell and she was trying to pick him up which led her to fall backwards landing on her buttocks.  Patient states she is experiencing pain in the left hip.  She was able to stand up and walk after the fall but states pain in the left hip and pelvis when she attempts to walk.  Patient denies hitting her head, denies LOC.  Denies any other injuries or pain.  Physical Exam   Triage Vital Signs: ED Triage Vitals  Enc Vitals Group     BP 07/16/22 0151 116/61     Pulse Rate 07/16/22 0151 (!) 103     Resp 07/16/22 0151 20     Temp 07/16/22 0151 99 F (37.2 C)     Temp Source 07/16/22 0151 Oral     SpO2 07/16/22 0151 93 %     Weight 07/16/22 0150 213 lb (96.6 kg)     Height 07/16/22 0150 5\' 2"  (1.575 m)     Head Circumference --      Peak Flow --      Pain Score 07/16/22 0149 10     Pain Loc --      Pain Edu? --      Excl. in GC? --     Most recent vital signs: Vitals:   07/16/22 0151 07/16/22 0207  BP: 116/61 (!) 102/56  Pulse: (!) 103 98  Resp: 20 20  Temp: 99 F (37.2 C) 98.9 F (37.2 C)  SpO2: 93% 95%    General: Awake, no distress.  CV:  Good peripheral perfusion.  Regular rate and rhythm  Resp:  Normal effort.  Equal breath sounds bilaterally.  Abd:  No distention.  Soft, nontender.  No rebound or guarding. Other:  Patient has mild tenderness to palpation of the left hip, moderate pain with range of motion of the left lower extremity, neurovascular intact distally, no shortening or rotation noted.  ED Results / Procedures / Treatments   RADIOLOGY  I have reviewed the x-ray images.  On my  interpretation the images I do not see any obvious fracture. Radiology is read the x-rays as negative. CT scan is negative   MEDICATIONS ORDERED IN ED: Medications - No data to display   IMPRESSION / MDM / ASSESSMENT AND PLAN / ED COURSE  I reviewed the triage vital signs and the nursing notes.  Patient's presentation is most consistent with acute presentation with potential threat to life or bodily function.  Patient presents emergency department after mechanical fall with left hip/pelvis pain.  Patient has been ambulatory since the fall but states pain when she ambulates.  Suspect likely pelvis fracture.  We will obtain hip and pelvis x-ray images to further evaluate.  Patient denies any other injuries denies hitting her head denies LOC.  Patient is x-rays do not appear to show any fracture.  However given the patient's discomfort and negative x-rays we will proceed with CT imaging to rule out occult fracture. Lab work shows no concerning findings, reassuring CBC, reassuring chemistry, CT scan of the pelvis is negative for fracture.  We will place patient on  a short course of pain medication, have the patient use a walker as needed when ambulating and follow-up with orthopedics.  Patient agreeable to plan of care.  FINAL CLINICAL IMPRESSION(S) / ED DIAGNOSES   Left hip pain Fall  Note:  This document was prepared using Dragon voice recognition software and may include unintentional dictation errors.   Harvest Dark, MD 07/16/22 [phone]

## 2022-07-16 NOTE — ED Notes (Signed)
DC instructions given  verbally and in writng, understanding voiced, signature obtained.  Pt and spouse taken to POV via wheelchairs.  Pt in stable condition.

## 2022-07-16 NOTE — Discharge Instructions (Addendum)
Please call the number provided for orthopedics to arrange a follow-up appointment this week for recheck/reevaluation.  Please use a walker to help take weight off of your hip while walking.  Return to the emergency department for any worsening pain or any other symptom personally concerning to yourself.

## 2022-07-17 ENCOUNTER — Encounter: Payer: Self-pay | Admitting: Emergency Medicine

## 2022-07-17 ENCOUNTER — Emergency Department
Admission: EM | Admit: 2022-07-17 | Discharge: 2022-07-17 | Disposition: A | Discharge status: Home / Self Care | Payer: Medicare Other | Attending: Student in an Organized Health Care Education/Training Program | Admitting: Student in an Organized Health Care Education/Training Program

## 2022-07-17 ENCOUNTER — Other Ambulatory Visit: Payer: Self-pay

## 2022-07-17 ENCOUNTER — Emergency Department: Payer: Medicare Other

## 2022-07-17 DIAGNOSIS — W1839XA Other fall on same level, initial encounter: Secondary | ICD-10-CM | POA: Insufficient documentation

## 2022-07-17 DIAGNOSIS — E119 Type 2 diabetes mellitus without complications: Secondary | ICD-10-CM | POA: Insufficient documentation

## 2022-07-17 DIAGNOSIS — W19XXXA Unspecified fall, initial encounter: Secondary | ICD-10-CM

## 2022-07-17 DIAGNOSIS — I509 Heart failure, unspecified: Secondary | ICD-10-CM | POA: Insufficient documentation

## 2022-07-17 DIAGNOSIS — I110 Hypertensive heart disease with heart failure: Secondary | ICD-10-CM | POA: Insufficient documentation

## 2022-07-17 DIAGNOSIS — S99922A Unspecified injury of left foot, initial encounter: Secondary | ICD-10-CM

## 2022-07-17 NOTE — Discharge Instructions (Addendum)
-  I suspect that you may have endured a stress fracture of your first metatarsal.  Keep the cam boot on while you are walking.  You do not need to wear it rest.  -You may take Tylenol/ibuprofen as needed for pain.  Recommend icing the affected area intermittently throughout the day.  -Follow-up with the podiatrist listed if your symptoms persist after a few weeks.  -Return to the emergency department at any time if you begin to experience any new or worsening symptoms.

## 2022-07-17 NOTE — ED Triage Notes (Signed)
Pt here with a fall on Friday. Pt states she was seen Sun night but then stated having big toe pain on last night that is severe per pt.

## 2022-07-17 NOTE — ED Provider Notes (Signed)
[facility] Provider Note    Event Date/Time   First MD Initiated Contact with Patient 07/17/22 0932     (approximate)   History   Chief Complaint Fall   HPI Betty Luna is a 71 y.o. female, history of hypertension, type 2 diabetes, CHF, alcohol use, presents to the emergency department for evaluation of left foot pain that started last night.  Ports experiencing a fall yesterday in which she fell backwards onto her buttocks after trying to pick up her husband who is also fallen.  She did not have any foot pain at that time, however she is now beginning to have pain today, particularly in the left great toe extending down to the left foot.  She is still able to walk on it, though with significant difficulty due to pain.  She does have a history of gout, however states that this does not feel like her usual gout.  Denies leg pain, knee pain, thigh pain, hip pain, numbness/tingling in lower extremities, cold sensation, rash/lesions, fever/chills, chest pain, shortness of breath, abdominal pain, or nausea/vomiting.  History Limitations: No limitations.        Physical Exam  Triage Vital Signs: ED Triage Vitals [07/17/22 0912]  Enc Vitals Group     BP (!) 143/110     Pulse Rate 91     Resp 18     Temp 97.7 F (36.5 C)     Temp Source Oral     SpO2 95 %     Weight 212 lb 15.4 oz (96.6 kg)     Height 5\' 2"  (1.575 m)     Head Circumference      Peak Flow      Pain Score 8     Pain Loc      Pain Edu?      Excl. in GC?     Most recent vital signs: Vitals:   07/17/22 0912  BP: (!) 143/110  Pulse: 91  Resp: 18  Temp: 97.7 F (36.5 C)  SpO2: 95%    General: Awake, NAD.  Skin: Warm, dry. No rashes or lesions.  Eyes: PERRL. Conjunctivae normal.  CV: Good peripheral perfusion.  Resp: Normal effort.  Abd: Soft, non-tender. No distention.  Neuro: At baseline. No gross neurological deficits.  Musculoskeletal: Normal ROM of all  extremities.  Focused Exam: No gross deformities to the left foot.  Moderate tenderness appreciated along the left great toe with extension into the first metatarsal.  PMS intact distally.  She is still able to ambulate well on her own.  Physical Exam    ED Results / Procedures / Treatments  Labs (all labs ordered are listed, but only abnormal results are displayed) Labs Reviewed - No data to display   EKG N/A.    RADIOLOGY  ED Provider Interpretation: I personally viewed and interpreted this x-ray, no evidence of acute fractures or dislocations.  DG Foot Complete Left  Result Date: 07/17/2022 CLINICAL DATA:  LEFT foot and great toe pain, pain up to ankle EXAM: LEFT FOOT - COMPLETE 3+ VIEW COMPARISON:  None FINDINGS: Osseous demineralization. Joint spaces preserved. No acute fracture, dislocation, or bone destruction. Large plantar calcaneal spur. Additional spurring at the dorsal margin of first TMT joint. IMPRESSION: Degenerative changes the LEFT first TMT joint and large plantar calcaneal spur. No acute osseous abnormalities. Electronically Signed   By: 07/19/2022 M.D.   On: 07/17/2022 10:10    PROCEDURES:  Critical Care performed: N/A.  Procedures    MEDICATIONS ORDERED IN ED: Medications - No data to display   IMPRESSION / MDM / [place] / ED COURSE  I reviewed the triage vital signs and the nursing notes.                              Differential diagnosis includes, but is not limited to, toe dislocation/fracture, metatarsal fracture, stress fracture, toe/foot sprain.   Assessment/Plan Patient presents with left great toe pain extending into the first metatarsal.  She is still able to ambulate, though with significant difficulty.  Physical exam is unimpressive.  X-ray does not show any evidence of acute fractures or dislocations.  Given the history, I suspect that the patient may likely have endured a stress fracture and may not be showing up on plain  radiographs.  We will provide her with a cam boot for now and have her follow-up with podiatry.  She states that she is comfortable with this plan.  To take Tylenol/ibuprofen as needed.  Will discharge.  Provided the patient with anticipatory guidance, return precautions, and educational material. Encouraged the patient to return to the emergency department at any time if they begin to experience any new or worsening symptoms. Patient expressed understanding and agreed with the plan.   Patient's presentation is most consistent with acute complicated illness / injury requiring diagnostic workup.       FINAL CLINICAL IMPRESSION(S) / ED DIAGNOSES   Final diagnoses:  Fall, initial encounter  Foot injury, left, initial encounter     Rx / DC Orders   ED Discharge Orders     None        Note:  This document was prepared using Dragon voice recognition software and may include unintentional dictation errors.   Teodoro Spray, [place] 07/17/22 1025    Merlyn Lot, MD 07/17/22 [phone]

## 2022-07-17 NOTE — ED Triage Notes (Signed)
First Nurse Note:  Fall Friday night.  Arrives today c/I left foot pain from great toe to ankle

## 2022-07-23 ENCOUNTER — Telehealth: Payer: Self-pay

## 2022-07-23 NOTE — Telephone Encounter (Signed)
Called patient and she informed me that she went to pick up some medications from her pharmacy and they gave her a refill for her Entresto at 50-51 MG which is the old dose she was taking. Her dose was increased to 97-103 MG twice a day at her OV on 07/02/22.  She also stated that she tried to pick up the Nexletol that we sent in also on 07/02/22, and was told her insurance did not approve it.  I see in the chart that we documented an approval on the PA on 07/05/22.   Called patients pharmacy, spoke with pharmacist and was told that patient ordered her refill electronically and ordered the old dose. Was informed that they would take that dose out of her list and that she would be able to refill her Entresto 97-103 when she needs it. I also requested that he run her insurance again for the [facility] and he found that it was approved. He stated that he would fill it now.  Called patient and informed her of above documentation. I also advised her that she can double up on the Entresto 49-51 MG dose to use up the pills she has on hand prior to refilling the 97-103 MG dose.  Patient was very grateful for the call and follow up.

## 2022-07-23 NOTE — Telephone Encounter (Signed)
Received the following secure chat from the call center.

## 2022-07-30 ENCOUNTER — Other Ambulatory Visit: Payer: Self-pay

## 2022-07-30 MED ORDER — SPIRONOLACTONE 25 MG PO TABS: 25.0000 mg | ORAL_TABLET | Freq: Every day | ORAL | 1 refills | Status: DC

## 2022-08-01 ENCOUNTER — Other Ambulatory Visit: Payer: Self-pay | Admitting: *Deleted

## 2022-08-01 MED ORDER — SPIRONOLACTONE 25 MG PO TABS: 25.0000 mg | ORAL_TABLET | Freq: Every day | ORAL | 1 refills | Status: DC

## 2022-08-01 NOTE — Telephone Encounter (Signed)
90 day supply along with additional refills has been sent to the pharmacy.

## 2022-08-05 ENCOUNTER — Emergency Department: Payer: Medicare Other

## 2022-08-05 ENCOUNTER — Other Ambulatory Visit: Payer: Self-pay

## 2022-08-05 ENCOUNTER — Emergency Department
Admission: EM | Admit: 2022-08-05 | Discharge: 2022-08-05 | Disposition: A | Discharge status: Home / Self Care | Payer: Medicare Other | Attending: Emergency Medicine | Admitting: Emergency Medicine

## 2022-08-05 DIAGNOSIS — I110 Hypertensive heart disease with heart failure: Secondary | ICD-10-CM | POA: Diagnosis not present

## 2022-08-05 DIAGNOSIS — E119 Type 2 diabetes mellitus without complications: Secondary | ICD-10-CM | POA: Diagnosis not present

## 2022-08-05 DIAGNOSIS — S3992XS Unspecified injury of lower back, sequela: Secondary | ICD-10-CM | POA: Diagnosis present

## 2022-08-05 DIAGNOSIS — W1839XS Other fall on same level, sequela: Secondary | ICD-10-CM | POA: Diagnosis not present

## 2022-08-05 DIAGNOSIS — S32040S Wedge compression fracture of fourth lumbar vertebra, sequela: Secondary | ICD-10-CM | POA: Insufficient documentation

## 2022-08-05 DIAGNOSIS — R102 Pelvic and perineal pain: Secondary | ICD-10-CM | POA: Diagnosis not present

## 2022-08-05 DIAGNOSIS — I509 Heart failure, unspecified: Secondary | ICD-10-CM | POA: Diagnosis not present

## 2022-08-05 LAB — URINALYSIS, ROUTINE W REFLEX MICROSCOPIC
Bilirubin Urine: NEGATIVE
Glucose, UA: NEGATIVE mg/dL
Hgb urine dipstick: NEGATIVE
Ketones, ur: NEGATIVE mg/dL
Nitrite: NEGATIVE
Protein, ur: NEGATIVE mg/dL
Specific Gravity, Urine: 1.009 (ref 1.005–1.030)
pH: 5 (ref 5.0–8.0)

## 2022-08-05 LAB — CBC WITH DIFFERENTIAL/PLATELET
Abs Immature Granulocytes: 0.02 10*3/uL (ref 0.00–0.07)
Basophils Absolute: 0 10*3/uL (ref 0.0–0.1)
Basophils Relative: 1 %
Eosinophils Absolute: 0.1 10*3/uL (ref 0.0–0.5)
Eosinophils Relative: 2 %
HCT: 38.3 % (ref 36.0–46.0)
Hemoglobin: 12.1 g/dL (ref 12.0–15.0)
Immature Granulocytes: 0 %
Lymphocytes Relative: 33 %
Lymphs Abs: 2 10*3/uL (ref 0.7–4.0)
MCH: 23.1 pg — ABNORMAL LOW (ref 26.0–34.0)
MCHC: 31.6 g/dL (ref 30.0–36.0)
MCV: 73.2 fL — ABNORMAL LOW (ref 80.0–100.0)
Monocytes Absolute: 0.7 10*3/uL (ref 0.1–1.0)
Monocytes Relative: 11 %
Neutro Abs: 3.2 10*3/uL (ref 1.7–7.7)
Neutrophils Relative %: 53 %
Platelets: 328 10*3/uL (ref 150–400)
RBC: 5.23 MIL/uL — ABNORMAL HIGH (ref 3.87–5.11)
RDW: 15.2 % (ref 11.5–15.5)
WBC: 6 10*3/uL (ref 4.0–10.5)
nRBC: 0 % (ref 0.0–0.2)

## 2022-08-05 LAB — COMPREHENSIVE METABOLIC PANEL
ALT: 17 U/L (ref 0–44)
AST: 26 U/L (ref 15–41)
Albumin: 3.9 g/dL (ref 3.5–5.0)
Alkaline Phosphatase: 111 U/L (ref 38–126)
Anion gap: 9 (ref 5–15)
BUN: 39 mg/dL — ABNORMAL HIGH (ref 8–23)
CO2: 26 mmol/L (ref 22–32)
Calcium: 9.5 mg/dL (ref 8.9–10.3)
Chloride: 102 mmol/L (ref 98–111)
Creatinine, Ser: 1.22 mg/dL — ABNORMAL HIGH (ref 0.44–1.00)
GFR, Estimated: 48 mL/min — ABNORMAL LOW (ref >60)
Glucose, Bld: 115 mg/dL — ABNORMAL HIGH (ref 70–99)
Potassium: 4.2 mmol/L (ref 3.5–5.1)
Sodium: 137 mmol/L (ref 135–145)
Total Bilirubin: 0.7 mg/dL (ref 0.3–1.2)
Total Protein: 7.9 g/dL (ref 6.5–8.1)

## 2022-08-05 LAB — LIPASE, BLOOD: Lipase: 27 U/L (ref 11–51)

## 2022-08-05 MED ORDER — FENTANYL CITRATE PF 50 MCG/ML IJ SOSY
100.0000 ug | PREFILLED_SYRINGE | Freq: Once | INTRAMUSCULAR | Status: AC
  Administered 2022-08-05: 100 ug via INTRAVENOUS
  Filled 2022-08-05: qty 2

## 2022-08-05 MED ORDER — OXYCODONE-ACETAMINOPHEN 5-325 MG PO TABS: 1.0000 | ORAL_TABLET | ORAL | 0 refills | Status: DC | PRN

## 2022-08-05 MED ORDER — IOHEXOL 300 MG/ML  SOLN
100.0000 mL | Freq: Once | INTRAMUSCULAR | Status: AC | PRN
  Administered 2022-08-05: 100 mL via INTRAVENOUS

## 2022-08-05 MED ORDER — ONDANSETRON HCL 4 MG/2ML IJ SOLN
4.0000 mg | Freq: Once | INTRAMUSCULAR | Status: AC
  Administered 2022-08-05: 4 mg via INTRAVENOUS
  Filled 2022-08-05: qty 2

## 2022-08-05 NOTE — ED Triage Notes (Signed)
Pt states abdominal pain and back pain, that has been on and off since she fell a month ago. Pt states when she fell a month ago, she fell on her bottom.  Pt states she has tried taking hydrocodone/acetaminophen that was prescribed to her without relief.

## 2022-08-05 NOTE — ED Provider Notes (Signed)
[facility] Provider Note    Event Date/Time   First MD Initiated Contact with Patient 08/05/22 1203     (approximate)  History   Chief Complaint: Abdominal Pain  HPI  Betty Luna is a 71 y.o. female with a past medical history of arthritis, CHF, diabetes, hypertension, presents to the emergency department for lower back and pelvis pain.  According to the patient over the past 3 weeks she has been experiencing pain in her lower back and pelvis ever since a fall.  Patient was seen in the emergency department last month with no significant findings.  She has been taking hydrocodone at home for pain but continues to have significant discomfort especially when up and walking around.  Denies any numbness or weakness.  No fever.  Physical Exam   Triage Vital Signs: ED Triage Vitals  Enc Vitals Group     BP 08/05/22 1122 (!) 122/59     Pulse Rate 08/05/22 1122 84     Resp 08/05/22 1122 18     Temp 08/05/22 1122 97.9 F (36.6 C)     Temp Source 08/05/22 1122 Oral     SpO2 08/05/22 1122 96 %     Weight 08/05/22 1131 213 lb (96.6 kg)     Height 08/05/22 1131 5\' 2"  (1.575 m)     Head Circumference --      Peak Flow --      Pain Score 08/05/22 1131 8     Pain Loc --      Pain Edu? --      Excl. in [place]? --     Most recent vital signs: Vitals:   08/05/22 1122  BP: (!) 122/59  Pulse: 84  Resp: 18  Temp: 97.9 F (36.6 C)  SpO2: 96%    General: Awake, no distress.  CV:  Good peripheral perfusion.  Regular rate and rhythm  Resp:  Normal effort.  Equal breath sounds bilaterally.  Abd:  No distention.  Soft, nontender.  No rebound or guarding. Other:  Mild lower lumbar tenderness to palpation in the midline and paraspinal region.   ED Results / Procedures / Treatments   RADIOLOGY  I reviewed and interpreted the CT images no obvious obstruction or significant finding on my evaluation. Radiology is read a 20% L4 vertebral compression  fracture.   MEDICATIONS ORDERED IN ED: Medications  fentaNYL (SUBLIMAZE) injection 100 mcg (100 mcg Intravenous Given 08/05/22 1244)  ondansetron (ZOFRAN) injection 4 mg (4 mg Intravenous Given 08/05/22 1244)  iohexol (OMNIPAQUE) 300 MG/ML solution 100 mL (100 mLs Intravenous Contrast Given 08/05/22 1251)     IMPRESSION / MDM / ASSESSMENT AND PLAN / ED COURSE  I reviewed the triage vital signs and the nursing notes.  Patient's presentation is most consistent with acute presentation with potential threat to life or bodily function.  Patient presents emergency department for lower back and pelvis pain ongoing for the past 3 weeks since a fall.  Patient's lab work is reassuring including normal CBC, normal chemistry and normal urinalysis.  CT scan does show an L4 compression fracture approximately 20% but no other concerning findings.  Given the patient's ongoing back pain we will refer to orthopedics, Dr. Rudene Christians for consideration of kyphoplasty.  We will discharge with Percocet for pain control.  Discussed with the patient not to take her hydrocodone if she is taking Percocet also not to drink or drive while taking this medication.  Patient agreeable to plan.  FINAL CLINICAL  IMPRESSION(S) / ED DIAGNOSES   L4 compression fracture  Note:  This document was prepared using Dragon voice recognition software and may include unintentional dictation errors.   Minna Antis, MD 08/05/22 1346

## 2022-08-05 NOTE — ED Provider Triage Note (Signed)
  Emergency Medicine Provider Triage Evaluation Note  Betty Luna , a 71 y.o.female,  was evaluated in triage.  Pt complains of lower abdominal pain and back pain.  Patient states that this been going on for about a month following a mechanical fall that she had.  However, she states that she is also been having some foul-smelling urine recently.  Reports pain is mostly in her lower left abdomen and on the lower part of her back.  Additionally endorses nausea.   Review of Systems  Positive: Abdominal pain, back pain, foul-smelling urine Negative: Denies fever, chest pain, vomiting  Physical Exam   Vitals:   08/05/22 1122  BP: (!) 122/59  Pulse: 84  Resp: 18  Temp: 97.9 F (36.6 C)  SpO2: 96%   Gen:   Awake, no distress   Resp:  Normal effort  MSK:   Moves extremities without difficulty  Other:    Medical Decision Making  Given the patient's initial medical screening exam, the following diagnostic evaluation has been ordered. The patient will be placed in the appropriate treatment space, once one is available, to complete the evaluation and treatment. I have discussed the plan of care with the patient and I have advised the patient that an ED physician or mid-level practitioner will reevaluate their condition after the test results have been received, as the results may give them additional insight into the type of treatment they may need.    Diagnostics: Labs, UA, abdominal CT  Treatments: none immediately   Teodoro Spray, [place] 08/05/22 1131

## 2022-08-13 ENCOUNTER — Ambulatory Visit: Payer: Medicare Other | Admitting: Gastroenterology

## 2022-08-13 ENCOUNTER — Other Ambulatory Visit: Payer: Self-pay

## 2022-09-03 ENCOUNTER — Other Ambulatory Visit: Payer: Self-pay | Admitting: Orthopedic Surgery

## 2022-09-03 ENCOUNTER — Encounter: Payer: Self-pay | Admitting: Cardiology

## 2022-09-03 ENCOUNTER — Ambulatory Visit: Payer: Medicare Other | Attending: Cardiology | Admitting: Cardiology

## 2022-09-03 DIAGNOSIS — S32040D Wedge compression fracture of fourth lumbar vertebra, subsequent encounter for fracture with routine healing: Secondary | ICD-10-CM

## 2022-09-12 ENCOUNTER — Ambulatory Visit
Admission: RE | Admit: 2022-09-12 | Discharge: 2022-09-12 | Disposition: A | Discharge status: Home / Self Care | Payer: Medicare Other | Source: Ambulatory Visit | Attending: Orthopedic Surgery | Admitting: Orthopedic Surgery

## 2022-09-12 DIAGNOSIS — S32040D Wedge compression fracture of fourth lumbar vertebra, subsequent encounter for fracture with routine healing: Secondary | ICD-10-CM | POA: Diagnosis present

## 2022-10-02 ENCOUNTER — Encounter: Payer: Self-pay | Admitting: Emergency Medicine

## 2022-10-02 ENCOUNTER — Emergency Department
Admission: EM | Admit: 2022-10-02 | Discharge: 2022-10-02 | Disposition: A | Discharge status: Home / Self Care | Payer: Medicare Other | Attending: Emergency Medicine | Admitting: Emergency Medicine

## 2022-10-02 ENCOUNTER — Other Ambulatory Visit: Payer: Self-pay

## 2022-10-02 DIAGNOSIS — X58XXXS Exposure to other specified factors, sequela: Secondary | ICD-10-CM | POA: Diagnosis not present

## 2022-10-02 DIAGNOSIS — I110 Hypertensive heart disease with heart failure: Secondary | ICD-10-CM | POA: Insufficient documentation

## 2022-10-02 DIAGNOSIS — S32049S Unspecified fracture of fourth lumbar vertebra, sequela: Secondary | ICD-10-CM | POA: Insufficient documentation

## 2022-10-02 DIAGNOSIS — E119 Type 2 diabetes mellitus without complications: Secondary | ICD-10-CM | POA: Diagnosis not present

## 2022-10-02 DIAGNOSIS — S3992XS Unspecified injury of lower back, sequela: Secondary | ICD-10-CM | POA: Diagnosis present

## 2022-10-02 DIAGNOSIS — I509 Heart failure, unspecified: Secondary | ICD-10-CM | POA: Insufficient documentation

## 2022-10-02 DIAGNOSIS — S32040S Wedge compression fracture of fourth lumbar vertebra, sequela: Secondary | ICD-10-CM

## 2022-10-02 LAB — CBG MONITORING, ED: Glucose-Capillary: 139 mg/dL — ABNORMAL HIGH (ref 70–99)

## 2022-10-02 MED ORDER — LIDOCAINE 5 % EX PTCH: 1.0000 | MEDICATED_PATCH | Freq: Two times a day (BID) | CUTANEOUS | 0 refills | Status: DC

## 2022-10-02 MED ORDER — DEXAMETHASONE SODIUM PHOSPHATE 10 MG/ML IJ SOLN
10.0000 mg | Freq: Once | INTRAMUSCULAR | Status: AC
  Administered 2022-10-02: 10 mg via INTRAMUSCULAR
  Filled 2022-10-02: qty 1

## 2022-10-02 MED ORDER — OXYCODONE HCL 5 MG PO TABS
5.0000 mg | ORAL_TABLET | Freq: Once | ORAL | Status: AC
  Administered 2022-10-02: 5 mg via ORAL
  Filled 2022-10-02: qty 1

## 2022-10-02 MED ORDER — METHOCARBAMOL 500 MG PO TABS: ORAL_TABLET | ORAL | 0 refills | Status: DC

## 2022-10-02 MED ORDER — METHOCARBAMOL 500 MG PO TABS
1000.0000 mg | ORAL_TABLET | Freq: Once | ORAL | Status: AC
  Administered 2022-10-02: 1000 mg via ORAL
  Filled 2022-10-02: qty 2

## 2022-10-02 MED ORDER — LIDOCAINE 5 % EX PTCH
1.0000 | MEDICATED_PATCH | CUTANEOUS | Status: DC
  Administered 2022-10-02: 1 via TRANSDERMAL
  Filled 2022-10-02: qty 1

## 2022-10-02 NOTE — ED Provider Notes (Signed)
[facility] Provider Note    Event Date/Time   First MD Initiated Contact with Patient 10/02/22 0730     (approximate)   History   Back Pain   HPI  Betty Luna is a 71 y.o. female   presents to the ED with complaint of low back pain.  Patient denies any recent injury but states that she is being sent from [facility] to [facility] for surgery for "a slipped disc".  Records indicate that she has been seen by Dr. Trilby Drummer for a compression fracture L4 and she had a scheduled appointment with Dr. Daron Offer at [facility] that she missed.  Appointment has been rescheduled for January 18.  Patient denies any new injury or symptoms.  She states that pain medication was sent to the pharmacy that she picked up and does not seem to be helping.  She also describes what sounds like muscle spasms and cannot get comfortable.  She denies any urinary symptoms, incontinence of bowel or bladder or saddle anesthesias.  Patient has history of hypertension, CHF, type 2 diabetes, cardiomyopathy, GERD and depression.      Physical Exam   Triage Vital Signs: ED Triage Vitals  Enc Vitals Group     BP 10/02/22 0601 (!) 123/52     Pulse Rate 10/02/22 0601 92     Resp 10/02/22 0601 19     Temp 10/02/22 0601 (!) 97.5 F (36.4 C)     Temp Source 10/02/22 0601 Oral     SpO2 10/02/22 0601 100 %     Weight 10/02/22 0600 211 lb 10.3 oz (96 kg)     Height 10/02/22 0600 5\' 2"  (1.575 m)     Head Circumference --      Peak Flow --      Pain Score 10/02/22 0600 10     Pain Loc --      Pain Edu? --      Excl. in GC? --     Most recent vital signs: Vitals:   10/02/22 0601  BP: (!) 123/52  Pulse: 92  Resp: 19  Temp: (!) 97.5 F (36.4 C)  SpO2: 100%     General: Awake, no distress.  CV:  Good peripheral perfusion.  Resp:  Normal effort.  Lungs are clear bilaterally. Abd:  No distention.  Soft, obese, nontender.  Bowel sounds present x 4 quadrants. Other:  On examination of the lumbar  spine patient is tender left lower lumbar and paravertebral muscles.  Patient is able to move lower extremities without assistance but reports that this increases her pain and she is unable to get comfortable.  No point tenderness on palpation of the lumbar spine or step-offs are appreciated.  Good muscle strength at 5/5 bilaterally.   ED Results / Procedures / Treatments   Labs (all labs ordered are listed, but only abnormal results are displayed) Labs Reviewed  CBG MONITORING, ED - Abnormal; Notable for the following components:      Result Value   Glucose-Capillary 139 (*)    All other components within normal limits      PROCEDURES:  Critical Care performed:   Procedures   MEDICATIONS ORDERED IN ED: Medications  lidocaine (LIDODERM) 5 % 1 patch (1 patch Transdermal Patch Applied 10/02/22 1131)  methocarbamol (ROBAXIN) tablet 1,000 mg (1,000 mg Oral Given 10/02/22 0832)  oxyCODONE (Oxy IR/ROXICODONE) immediate release tablet 5 mg (5 mg Oral Given 10/02/22 0832)  dexamethasone (DECADRON) injection 10 mg (10 mg Intramuscular Given  10/02/22 1002)     IMPRESSION / MDM / ASSESSMENT AND PLAN / ED COURSE  I reviewed the triage vital signs and the nursing notes.   Differential diagnosis includes, but is not limited to, lumbar pain, chronic pain, compression fracture L4 acute exacerbation, no complaint to suggest cauda equina.  71 year old female presents to the ED with complaint of pain not relieved by her current hydrocodone that was called in yesterday afternoon.  She states that she does not have any muscle relaxants at this time and that there is a pulling sensation when she moves.  No other new symptoms or recent injuries.  Patient has been scheduled to see Dr. Daron Offer at [facility] and already has an appointment for January 18.  Patient was given oxycodone 5 mg p.o. and methocarbamol 1000 mg p.o.  Because her blood sugar is under control and talking to her they usually stay less than 180  an injection of dexamethasone 10 mg was given IM.  Lidoderm patch 5% was applied to the area and a prescription for the same along with methocarbamol 1 or 2 every 6 hours as needed for muscle spasms was sent to the pharmacy.  She is encouraged to use ice or heat to her back as needed for discomfort especially if it is a muscle spasm that she is experiencing.  Patient was discharged somewhat improved with the muscle relaxant that was given to her.      Patient's presentation is most consistent with acute illness / injury with system symptoms.  FINAL CLINICAL IMPRESSION(S) / ED DIAGNOSES   Final diagnoses:  Compression fracture of L4 lumbar vertebra, sequela     Rx / DC Orders   ED Discharge Orders          Ordered    lidocaine (LIDODERM) 5 %  Every 12 hours        10/02/22 1125    methocarbamol (ROBAXIN) 500 MG tablet        10/02/22 1125             Note:  This document was prepared using Dragon voice recognition software and may include unintentional dictation errors.   Tommi Rumps, PA-C 10/02/22 1543    Phineas Semen, MD 10/03/22 Jerene Bears

## 2022-10-02 NOTE — ED Triage Notes (Signed)
Pt to triage va w/c with no distress noted; c/o lower back pain, nonradiating with no accomp symptoms; st that she has a "slipped disc" and has surgery pending

## 2022-10-02 NOTE — Discharge Instructions (Signed)
Keep your appointment with Dr. Daron Offer at [facility] Continue with your regular medication and the medication that was called in for you yesterday. Robaxin 1-2 every 6 hours prn also spasms.  Also a Lidoderm patch was applied to your back while in the emergency department and a prescription for the same was sent to the pharmacy.

## 2022-10-08 ENCOUNTER — Other Ambulatory Visit: Payer: Self-pay

## 2022-10-08 ENCOUNTER — Emergency Department
Admission: EM | Admit: 2022-10-08 | Discharge: 2022-10-08 | Disposition: A | Discharge status: Home / Self Care | Payer: Medicare Other | Attending: Emergency Medicine | Admitting: Emergency Medicine

## 2022-10-08 DIAGNOSIS — I10 Essential (primary) hypertension: Secondary | ICD-10-CM | POA: Insufficient documentation

## 2022-10-08 LAB — CBC WITH DIFFERENTIAL/PLATELET
Abs Immature Granulocytes: 0.03 10*3/uL (ref 0.00–0.07)
Basophils Absolute: 0 10*3/uL (ref 0.0–0.1)
Basophils Relative: 1 %
Eosinophils Absolute: 0.1 10*3/uL (ref 0.0–0.5)
Eosinophils Relative: 2 %
HCT: 36.1 % (ref 36.0–46.0)
Hemoglobin: 11.3 g/dL — ABNORMAL LOW (ref 12.0–15.0)
Immature Granulocytes: 0 %
Lymphocytes Relative: 29 %
Lymphs Abs: 2.4 10*3/uL (ref 0.7–4.0)
MCH: 23.4 pg — ABNORMAL LOW (ref 26.0–34.0)
MCHC: 31.3 g/dL (ref 30.0–36.0)
MCV: 74.9 fL — ABNORMAL LOW (ref 80.0–100.0)
Monocytes Absolute: 0.8 10*3/uL (ref 0.1–1.0)
Monocytes Relative: 10 %
Neutro Abs: 4.9 10*3/uL (ref 1.7–7.7)
Neutrophils Relative %: 58 %
Platelets: 356 10*3/uL (ref 150–400)
RBC: 4.82 MIL/uL (ref 3.87–5.11)
RDW: 15.9 % — ABNORMAL HIGH (ref 11.5–15.5)
WBC: 8.3 10*3/uL (ref 4.0–10.5)
nRBC: 0 % (ref 0.0–0.2)

## 2022-10-08 LAB — COMPREHENSIVE METABOLIC PANEL
ALT: 16 U/L (ref 0–44)
AST: 23 U/L (ref 15–41)
Albumin: 3.6 g/dL (ref 3.5–5.0)
Alkaline Phosphatase: 75 U/L (ref 38–126)
Anion gap: 11 (ref 5–15)
BUN: 28 mg/dL — ABNORMAL HIGH (ref 8–23)
CO2: 30 mmol/L (ref 22–32)
Calcium: 9.8 mg/dL (ref 8.9–10.3)
Chloride: 97 mmol/L — ABNORMAL LOW (ref 98–111)
Creatinine, Ser: 1.08 mg/dL — ABNORMAL HIGH (ref 0.44–1.00)
GFR, Estimated: 55 mL/min — ABNORMAL LOW (ref >60)
Glucose, Bld: 102 mg/dL — ABNORMAL HIGH (ref 70–99)
Potassium: 4.1 mmol/L (ref 3.5–5.1)
Sodium: 138 mmol/L (ref 135–145)
Total Bilirubin: 0.6 mg/dL (ref 0.3–1.2)
Total Protein: 7.6 g/dL (ref 6.5–8.1)

## 2022-10-08 NOTE — ED Provider Notes (Signed)
[facility] Provider Note    Event Date/Time   First MD Initiated Contact with Patient 10/08/22 2120     (approximate)   History   Hypertension   HPI  Betty Luna is a 71 y.o. female with a history of high blood pressure presents with complaints of high blood pressure.  Patient reports that her blood pressure was low over the weekend so her doctors held her blood pressure medication, she restarted one of them today but noticed that her blood pressure was 160 systolic so she became concerned and came to the emergency department.  She feels well and has no complaints.     Physical Exam   Triage Vital Signs: ED Triage Vitals  Enc Vitals Group     BP 10/08/22 2004 (!) 146/77     Pulse Rate 10/08/22 2004 95     Resp 10/08/22 2004 18     Temp 10/08/22 2004 98.1 F (36.7 C)     Temp Source 10/08/22 2004 Oral     SpO2 10/08/22 2004 98 %     Weight 10/08/22 2004 95.7 kg (211 lb)     Height 10/08/22 2004 1.575 m (5\' 2" )     Head Circumference --      Peak Flow --      Pain Score 10/08/22 2004 0     Pain Loc --      Pain Edu? --      Excl. in GC? --     Most recent vital signs: Vitals:   10/08/22 2004  BP: (!) 146/77  Pulse: 95  Resp: 18  Temp: 98.1 F (36.7 C)  SpO2: 98%     General: Awake, no distress.  CV:  Good peripheral perfusion.  Resp:  Normal effort.  Abd:  No distention. Other:     ED Results / Procedures / Treatments   Labs (all labs ordered are listed, but only abnormal results are displayed) Labs Reviewed  COMPREHENSIVE METABOLIC PANEL - Abnormal; Notable for the following components:      Result Value   Chloride 97 (*)    Glucose, Bld 102 (*)    BUN 28 (*)    Creatinine, Ser 1.08 (*)    GFR, Estimated 55 (*)    All other components within normal limits  CBC WITH DIFFERENTIAL/PLATELET - Abnormal; Notable for the following components:   Hemoglobin 11.3 (*)    MCV 74.9 (*)    MCH 23.4 (*)    RDW 15.9 (*)     All other components within normal limits     EKG  ED ECG REPORT I, 10/10/22, the attending physician, personally viewed and interpreted this ECG.  Date: 10/08/2022  Rhythm: normal sinus rhythm QRS Axis: normal Intervals: Bundle branch block ST/T Wave abnormalities: normal Narrative Interpretation: no evidence of acute ischemia    RADIOLOGY     PROCEDURES:  Critical Care performed:   Procedures   MEDICATIONS ORDERED IN ED: Medications - No data to display   IMPRESSION / MDM / ASSESSMENT AND PLAN / ED COURSE  I reviewed the triage vital signs and the nursing notes. Patient's presentation is most consistent with exacerbation of chronic illness.  Patient has a history of high blood pressure.  Her medications have recently been adjusted and she is not on as many medications as typical.  Her blood pressure was elevated at home, here it is 146/77, she is asymptomatic.  I encourage patient to continue on current regimen for  at least a week before any additional changes, she agrees with this plan and will follow-up with her PCP.        FINAL CLINICAL IMPRESSION(S) / ED DIAGNOSES   Final diagnoses:  Primary hypertension     Rx / DC Orders   ED Discharge Orders     None        Note:  This document was prepared using Dragon voice recognition software and may include unintentional dictation errors.   Jene Every, MD 10/08/22 [phone]

## 2022-10-08 NOTE — ED Triage Notes (Signed)
Patient reports high blood pressure since Saturday. Denies chest pain or shortness of breath. Patient AOX4. Resp even, unlabored on RA. Speaking in full clear sentences.

## 2022-10-08 NOTE — ED Provider Triage Note (Signed)
Emergency Medicine Provider Triage Evaluation Note  Betty Luna , a 71 y.o. female  was evaluated in triage.  Pt complains of HTN. PAtient has had some low blood pressures and they took her off her metoprolol. Now with HTN.  Review of Systems  Positive: HTN Negative: CP, HA, SOB  Physical Exam  There were no vitals taken for this visit. Gen:   Awake, no distress   Resp:  Normal effort  MSK:   Moves extremities without difficulty  Other:    Medical Decision Making  Medically screening exam initiated at 8:01 PM.  Appropriate orders placed.  Betty Luna was informed that the remainder of the evaluation will be completed by another provider, this initial triage assessment does not replace that evaluation, and the importance of remaining in the ED until their evaluation is complete.  Labs, ekg   Racheal Patches, [place] 10/08/22 2040

## 2022-10-16 ENCOUNTER — Telehealth: Payer: Self-pay

## 2022-10-16 NOTE — Telephone Encounter (Signed)
Prior Authorization initiated via CoverMyMeds for Nexletol 180 MG tablets. Key: [id number]    Response: [facility] is reviewing your PA request. Typically an electronic response will be received within 24-72 hours. To check for an update later, open this request from your dashboard.

## 2022-10-16 NOTE — Telephone Encounter (Signed)
This medication or product was previously approved on [id number] from 07/02/22 to 10/21/22, and on [id number] from 10/22/22 to 10/22/23. You will be able to fill a prescription for this medication at your pharmacy. If your pharmacy has questions regarding the processing of your prescription, please have them call the [facility] pharmacy help desk at [phone].

## 2022-12-13 ENCOUNTER — Telehealth: Payer: Self-pay

## 2022-12-13 NOTE — Telephone Encounter (Signed)
Introductory phone call made to patient prior to her new patient appointment with Dr. Tasia Catchings on 12/14/22. I introduced myself to patient and asked it she had any questions about the appointment and the location of the [place]. Patient stated that she knew where that [place] is located. She did ask me what Dr. Tasia Catchings would be doing at tomorrow's appointment. I told patient that she would be seeing her and then probably get labs before she leaves. Patient thanked me for calling.

## 2022-12-14 ENCOUNTER — Inpatient Hospital Stay: Payer: 59 | Attending: Oncology | Admitting: Oncology

## 2022-12-14 ENCOUNTER — Inpatient Hospital Stay: Payer: 59

## 2022-12-14 ENCOUNTER — Emergency Department: Payer: 59

## 2022-12-14 ENCOUNTER — Encounter: Payer: Self-pay | Admitting: Oncology

## 2022-12-14 ENCOUNTER — Emergency Department
Admission: EM | Admit: 2022-12-14 | Discharge: 2022-12-14 | Disposition: A | Discharge status: Home / Self Care | Payer: 59 | Attending: Emergency Medicine | Admitting: Emergency Medicine

## 2022-12-14 VITALS — BP 147/63 | HR 97 | Temp 97.3°F | Resp 18 | Wt 214.3 lb

## 2022-12-14 DIAGNOSIS — Z79899 Other long term (current) drug therapy: Secondary | ICD-10-CM | POA: Insufficient documentation

## 2022-12-14 DIAGNOSIS — Z87891 Personal history of nicotine dependence: Secondary | ICD-10-CM | POA: Diagnosis not present

## 2022-12-14 DIAGNOSIS — I509 Heart failure, unspecified: Secondary | ICD-10-CM | POA: Diagnosis not present

## 2022-12-14 DIAGNOSIS — N1831 Chronic kidney disease, stage 3a: Secondary | ICD-10-CM | POA: Insufficient documentation

## 2022-12-14 DIAGNOSIS — D509 Iron deficiency anemia, unspecified: Secondary | ICD-10-CM | POA: Diagnosis not present

## 2022-12-14 DIAGNOSIS — I130 Hypertensive heart and chronic kidney disease with heart failure and stage 1 through stage 4 chronic kidney disease, or unspecified chronic kidney disease: Secondary | ICD-10-CM | POA: Insufficient documentation

## 2022-12-14 DIAGNOSIS — E1122 Type 2 diabetes mellitus with diabetic chronic kidney disease: Secondary | ICD-10-CM | POA: Insufficient documentation

## 2022-12-14 DIAGNOSIS — R109 Unspecified abdominal pain: Secondary | ICD-10-CM | POA: Diagnosis present

## 2022-12-14 DIAGNOSIS — N390 Urinary tract infection, site not specified: Secondary | ICD-10-CM

## 2022-12-14 DIAGNOSIS — N189 Chronic kidney disease, unspecified: Secondary | ICD-10-CM | POA: Diagnosis not present

## 2022-12-14 DIAGNOSIS — N183 Chronic kidney disease, stage 3 unspecified: Secondary | ICD-10-CM | POA: Diagnosis not present

## 2022-12-14 LAB — BASIC METABOLIC PANEL
Anion gap: 13 (ref 5–15)
BUN: 25 mg/dL — ABNORMAL HIGH (ref 8–23)
CO2: 27 mmol/L (ref 22–32)
Calcium: 9.5 mg/dL (ref 8.9–10.3)
Chloride: 97 mmol/L — ABNORMAL LOW (ref 98–111)
Creatinine, Ser: 1.12 mg/dL — ABNORMAL HIGH (ref 0.44–1.00)
GFR, Estimated: 53 mL/min — ABNORMAL LOW (ref >60)
Glucose, Bld: 125 mg/dL — ABNORMAL HIGH (ref 70–99)
Potassium: 3.6 mmol/L (ref 3.5–5.1)
Sodium: 137 mmol/L (ref 135–145)

## 2022-12-14 LAB — URINALYSIS, ROUTINE W REFLEX MICROSCOPIC
Bilirubin Urine: NEGATIVE
Glucose, UA: >=500 mg/dL — AB
Ketones, ur: NEGATIVE mg/dL
Nitrite: NEGATIVE
Protein, ur: 30 mg/dL — AB
Specific Gravity, Urine: 1.013 (ref 1.005–1.030)
WBC, UA: >50 WBC/hpf (ref 0–5)
pH: 5 (ref 5.0–8.0)

## 2022-12-14 LAB — COMPREHENSIVE METABOLIC PANEL
ALT: 15 U/L (ref 0–44)
AST: 16 U/L (ref 15–41)
Albumin: 3.6 g/dL (ref 3.5–5.0)
Alkaline Phosphatase: 74 U/L (ref 38–126)
Anion gap: 14 (ref 5–15)
BUN: 26 mg/dL — ABNORMAL HIGH (ref 8–23)
CO2: 26 mmol/L (ref 22–32)
Calcium: 10 mg/dL (ref 8.9–10.3)
Chloride: 97 mmol/L — ABNORMAL LOW (ref 98–111)
Creatinine, Ser: 1.04 mg/dL — ABNORMAL HIGH (ref 0.44–1.00)
GFR, Estimated: 57 mL/min — ABNORMAL LOW (ref >60)
Glucose, Bld: 138 mg/dL — ABNORMAL HIGH (ref 70–99)
Potassium: 3.8 mmol/L (ref 3.5–5.1)
Sodium: 137 mmol/L (ref 135–145)
Total Bilirubin: 1.2 mg/dL (ref 0.3–1.2)
Total Protein: 7.6 g/dL (ref 6.5–8.1)

## 2022-12-14 LAB — CBC WITH DIFFERENTIAL/PLATELET
Abs Immature Granulocytes: 0.2 10*3/uL — ABNORMAL HIGH (ref 0.00–0.07)
Abs Immature Granulocytes: 0.03 10*3/uL (ref 0.00–0.07)
Basophils Absolute: 0 10*3/uL (ref 0.0–0.1)
Basophils Absolute: 0 10*3/uL (ref 0.0–0.1)
Basophils Relative: 0 %
Basophils Relative: 0 %
Eosinophils Absolute: 0 10*3/uL (ref 0.0–0.5)
Eosinophils Absolute: 0 10*3/uL (ref 0.0–0.5)
Eosinophils Relative: 0 %
Eosinophils Relative: 0 %
HCT: 33.1 % — ABNORMAL LOW (ref 36.0–46.0)
HCT: 43.1 % (ref 36.0–46.0)
Hemoglobin: 10.3 g/dL — ABNORMAL LOW (ref 12.0–15.0)
Hemoglobin: 13.4 g/dL (ref 12.0–15.0)
Immature Granulocytes: 2 %
Immature Granulocytes: 0 %
Lymphocytes Relative: 18 %
Lymphocytes Relative: 14 %
Lymphs Abs: 2.1 10*3/uL (ref 0.7–4.0)
Lymphs Abs: 1.2 10*3/uL (ref 0.7–4.0)
MCH: 23 pg — ABNORMAL LOW (ref 26.0–34.0)
MCH: 22.9 pg — ABNORMAL LOW (ref 26.0–34.0)
MCHC: 31.1 g/dL (ref 30.0–36.0)
MCHC: 31.1 g/dL (ref 30.0–36.0)
MCV: 74 fL — ABNORMAL LOW (ref 80.0–100.0)
MCV: 73.8 fL — ABNORMAL LOW (ref 80.0–100.0)
Monocytes Absolute: 1.3 10*3/uL — ABNORMAL HIGH (ref 0.1–1.0)
Monocytes Absolute: 0.9 10*3/uL (ref 0.1–1.0)
Monocytes Relative: 11 %
Monocytes Relative: 11 %
Neutro Abs: 8.3 10*3/uL — ABNORMAL HIGH (ref 1.7–7.7)
Neutro Abs: 6.4 10*3/uL (ref 1.7–7.7)
Neutrophils Relative %: 69 %
Neutrophils Relative %: 75 %
Platelets: 228 10*3/uL (ref 150–400)
Platelets: 169 10*3/uL (ref 150–400)
RBC: 4.47 MIL/uL (ref 3.87–5.11)
RBC: 5.84 MIL/uL — ABNORMAL HIGH (ref 3.87–5.11)
RDW: 16.6 % — ABNORMAL HIGH (ref 11.5–15.5)
RDW: 17 % — ABNORMAL HIGH (ref 11.5–15.5)
WBC: 11.9 10*3/uL — ABNORMAL HIGH (ref 4.0–10.5)
WBC: 8.6 10*3/uL (ref 4.0–10.5)
nRBC: 0 % (ref 0.0–0.2)
nRBC: 0 % (ref 0.0–0.2)

## 2022-12-14 LAB — RETIC PANEL
Immature Retic Fract: 11.9 % (ref 2.3–15.9)
RBC.: 4.47 MIL/uL (ref 3.87–5.11)
Retic Count, Absolute: 81.8 10*3/uL (ref 19.0–186.0)
Retic Ct Pct: 1.8 % (ref 0.4–3.1)
Reticulocyte Hemoglobin: 24 pg — ABNORMAL LOW (ref >27.9)

## 2022-12-14 LAB — LACTATE DEHYDROGENASE: LDH: 127 U/L (ref 98–192)

## 2022-12-14 LAB — FERRITIN: Ferritin: 208 ng/mL (ref 11–307)

## 2022-12-14 LAB — VITAMIN B12: Vitamin B-12: 224 pg/mL (ref 180–914)

## 2022-12-14 LAB — FOLATE: Folate: 10.8 ng/mL (ref >5.9)

## 2022-12-14 LAB — IRON AND TIBC
Iron: 29 ug/dL (ref 28–170)
Saturation Ratios: 10 % — ABNORMAL LOW (ref 10.4–31.8)
TIBC: 304 ug/dL (ref 250–450)
UIBC: 275 ug/dL

## 2022-12-14 MED ORDER — SODIUM CHLORIDE 0.9 % IV BOLUS
1000.0000 mL | Freq: Once | INTRAVENOUS | Status: AC
  Administered 2022-12-14: 1000 mL via INTRAVENOUS

## 2022-12-14 MED ORDER — HYDROCODONE-ACETAMINOPHEN 5-325 MG PO TABS: 1.0000 | ORAL_TABLET | Freq: Three times a day (TID) | ORAL | 0 refills | Status: AC | PRN

## 2022-12-14 MED ORDER — PHENAZOPYRIDINE HCL 100 MG PO TABS: 100.0000 mg | ORAL_TABLET | Freq: Three times a day (TID) | ORAL | 0 refills | Status: AC | PRN

## 2022-12-14 MED ORDER — FENTANYL CITRATE PF 50 MCG/ML IJ SOSY: 50.0000 ug | PREFILLED_SYRINGE | Freq: Once | INTRAMUSCULAR | Status: DC

## 2022-12-14 MED ORDER — ONDANSETRON HCL 4 MG/2ML IJ SOLN: 4.0000 mg | Freq: Once | INTRAMUSCULAR | Status: DC

## 2022-12-14 MED ORDER — ONDANSETRON 4 MG PO TBDP
4.0000 mg | ORAL_TABLET | Freq: Once | ORAL | Status: AC
  Administered 2022-12-14: 4 mg via ORAL
  Filled 2022-12-14: qty 1

## 2022-12-14 MED ORDER — SODIUM CHLORIDE 0.9 % IV SOLN
2.0000 g | Freq: Once | INTRAVENOUS | Status: AC
  Administered 2022-12-14: 2 g via INTRAVENOUS
  Filled 2022-12-14: qty 20

## 2022-12-14 MED ORDER — PHENAZOPYRIDINE HCL 200 MG PO TABS
200.0000 mg | ORAL_TABLET | Freq: Once | ORAL | Status: AC
  Administered 2022-12-14: 200 mg via ORAL
  Filled 2022-12-14: qty 1

## 2022-12-14 MED ORDER — CEPHALEXIN 500 MG PO CAPS: 500.0000 mg | ORAL_CAPSULE | Freq: Three times a day (TID) | ORAL | 0 refills | Status: AC

## 2022-12-14 MED ORDER — ONDANSETRON 4 MG PO TBDP: 4.0000 mg | ORAL_TABLET | Freq: Three times a day (TID) | ORAL | 0 refills | Status: DC | PRN

## 2022-12-14 MED ORDER — FENTANYL CITRATE PF 50 MCG/ML IJ SOSY
50.0000 ug | PREFILLED_SYRINGE | Freq: Once | INTRAMUSCULAR | Status: AC
  Administered 2022-12-14: 50 ug via INTRAMUSCULAR
  Filled 2022-12-14: qty 1

## 2022-12-14 MED ORDER — KETOROLAC TROMETHAMINE 30 MG/ML IJ SOLN
30.0000 mg | Freq: Once | INTRAMUSCULAR | Status: AC
  Administered 2022-12-14: 30 mg via INTRAVENOUS
  Filled 2022-12-14: qty 1

## 2022-12-14 NOTE — Assessment & Plan Note (Addendum)
Labs are reviewed and discussed with patient. Ferritin level is elevated, but could be falsely elevated due to acute phase reactant.  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  Check cbc , cmp, iron tibc ferritin, myeloma panel, light chain ratio, B12, retic panel, LDH. Hemoglobinopathy evaluation.

## 2022-12-14 NOTE — ED Notes (Signed)
Two unsuccessful IV attempts by this RN. PA=C is aware.

## 2022-12-14 NOTE — ED Triage Notes (Signed)
Pt presents to the ED via POV due to increasing back pain. Pt states she has a hx of back pain but "this is different" pt states she was started on a new medication Farixga and was given Fluconazole just in case for an "UTI". Pt A&Ox4

## 2022-12-14 NOTE — Assessment & Plan Note (Signed)
Encourage oral hydration and avoid nephrotoxins.   

## 2022-12-14 NOTE — Progress Notes (Signed)
Hematology/Oncology Consult note [phone] [phone]      Patient Care Team: Josephine Cables, MD as PCP - General (Obstetrics and Gynecology) Kate Sable, MD as PCP - Cardiology (Cardiology)   REFERRING PROVIDER: Josephine Cables, MD  CHIEF COMPLAINTS/REASON FOR VISIT:  Anemia  ASSESSMENT & PLAN:  Microcytic anemia Labs are reviewed and discussed with patient. Ferritin level is elevated, but could be falsely elevated due to acute phase reactant.  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  Check cbc , cmp, iron tibc ferritin, myeloma panel, light chain ratio, B12, retic panel, LDH. Hemoglobinopathy evaluation.    CKD (chronic kidney disease) stage 3, GFR 30-59 ml/min (HCC) Encourage oral hydration and avoid nephrotoxins.   Orders Placed This Encounter  Procedures   CBC with Differential/Platelet    Standing Status:   Future    Number of Occurrences:   1    Standing Expiration Date:   12/15/2023   Comprehensive metabolic panel    Standing Status:   Future    Number of Occurrences:   1    Standing Expiration Date:   12/15/2023   Multiple Myeloma Panel (SPEP&IFE w/QIG)    Standing Status:   Future    Number of Occurrences:   1    Standing Expiration Date:   12/15/2023   Kappa/lambda light chains    Standing Status:   Future    Number of Occurrences:   1    Standing Expiration Date:   12/15/2023   Lactate dehydrogenase    Standing Status:   Future    Number of Occurrences:   1    Standing Expiration Date:   12/15/2023   Iron and TIBC    Standing Status:   Future    Number of Occurrences:   1    Standing Expiration Date:   12/15/2023   Ferritin    Standing Status:   Future    Number of Occurrences:   1    Standing Expiration Date:   06/14/2023   Vitamin B12    Standing Status:   Future    Number of Occurrences:   1    Standing Expiration Date:   12/15/2023   Folate    Standing Status:   Future    Number of  Occurrences:   1    Standing Expiration Date:   12/15/2023   Retic Panel    Standing Status:   Future    Number of Occurrences:   1    Standing Expiration Date:   12/15/2023   Technologist smear review    Standing Status:   Future    Standing Expiration Date:   12/15/2023    Order Specific Question:   Clinical information:    Answer:   anemia   Hgb Fractionation Cascade    Standing Status:   Future    Number of Occurrences:   1    Standing Expiration Date:   12/15/2023   Follow up in 3-4 week to review results.  All questions were answered. The patient knows to call the clinic with any problems, questions or concerns.  Earlie Server, MD, PhD [facility] Hematology Oncology 12/14/2022     HISTORY OF PRESENTING ILLNESS:  Betty Luna is a  72 y.o.  female with PMH listed below who was referred to me for anemia Reviewed patient's recent labs that was done recently - scanned in media 11/29/2022 Ferritin 362, iron saturation 20%, TIBC 268, Hb 10.4,MCV 74, platelet 501.  Anemia  is chronic since 06/2022. Chroniclly microcytic with mcv in low 70s.  + fatigue.  She denies recent chest pain on exertion, pre-syncopal episodes, or palpitations She had not noticed any recent bleeding such as epistaxis, hematuria or hematochezia.  Patient take Celebrex   Severe back pain.   09/12/22 MRI lumbar spine wo contrast showed  Subacute superior endplate compression fracture of L4 with approximately 20% height loss, unchanged from recent CT. Minimal residual marrow edema. No significant bony retropulsion.  Periarticular marrow edema at the left L3-L4 facet with adjacent soft tissue edema, likely reactive and/or related to facet arthritis. Degenerative changes of the lumbar spine, most prominent L3-L4 and L4-L5. Moderate-severe left lateral recess and neural foraminal stenosis at L3-L4, with potential for impingement the descending left L4 and exiting left L3 nerve roots. Mild to moderate right lateral recess  stenosis. Moderate left and mild-to-moderate right neural foraminal stenosis at L4-L5. Degenerative grade 1 anterolisthesis at this level.     MEDICAL HISTORY:  Past Medical History:  Diagnosis Date   Anemia    Arthritis    CHF (congestive heart failure) ([place])    Depression    Diabetes mellitus without complication (HCC)    GERD (gastroesophageal reflux disease)    Gout    Hypertension     SURGICAL HISTORY: Past Surgical History:  Procedure Laterality Date   ABDOMINAL HYSTERECTOMY     CATARACT EXTRACTION W/PHACO Right 04/16/2017   Procedure: CATARACT EXTRACTION PHACO AND INTRAOCULAR LENS PLACEMENT ([place]);  Surgeon: Birder Robson, MD;  Location: [facility];  Service: Ophthalmology;  Laterality: Right;  [place] 01:06 AP% 16.5 CDE 11.03 Fluid pack lot # [id number]   CATARACT EXTRACTION W/PHACO Left 10/01/2017   Procedure: CATARACT EXTRACTION PHACO AND INTRAOCULAR LENS PLACEMENT ([facility])-LEFT DIABETIC;  Surgeon: Birder Robson, MD;  Location: [facility];  Service: Ophthalmology;  Laterality: Left;  [place] 01:05.7 AP% 12.2 CDE 7.42 Fluid pack lot # [id number]   CTR Left    EYE SURGERY Left    RIGHT/LEFT HEART CATH AND CORONARY ANGIOGRAPHY Bilateral 01/01/2022   Procedure: RIGHT/LEFT HEART CATH AND CORONARY ANGIOGRAPHY;  Surgeon: Wellington Hampshire, MD;  Location: [facility];  Service: Cardiovascular;  Laterality: Bilateral;   TONSILLECTOMY      SOCIAL HISTORY: Social History   Socioeconomic History   Marital status: Married    Spouse name: Not on file   Number of children: Not on file   Years of education: Not on file   Highest education level: Not on file  Occupational History   Not on file  Tobacco Use   Smoking status: Former    Packs/day: 0.50    Types: Cigarettes    Quit date: 2006    Years since quitting: 18.1   Smokeless tobacco: Current    Types: Snuff  Vaping Use   Vaping Use: Never used  Substance and Sexual Activity   Alcohol use: Yes    Comment:  occasonally   Drug use: No   Sexual activity: Not Currently  Other Topics Concern   Not on file  Social History Narrative   Not on file   Social Determinants of Health   Financial Resource Strain: Not on file  Food Insecurity: No Food Insecurity (12/14/2022)   Hunger Vital Sign    Worried About Running Out of Food in the Last Year: Never true    Ran Out of Food in the Last Year: Never true  Transportation Needs: No Transportation Needs (12/14/2022)   PRAPARE - Transportation  Lack of Transportation (Medical): No    Lack of Transportation (Non-Medical): No  Physical Activity: Not on file  Stress: Not on file  Social Connections: Not on file  Intimate Partner Violence: Not At Risk (12/14/2022)   Humiliation, Afraid, Rape, and Kick questionnaire    Fear of Current or Ex-Partner: No    Emotionally Abused: No    Physically Abused: No    Sexually Abused: No    FAMILY HISTORY: Family History  Problem Relation Age of Onset   Breast cancer Sister     ALLERGIES:  is allergic to omeprazole, tramadol, atorvastatin, and codeine.  MEDICATIONS:  Current Outpatient Medications  Medication Sig Dispense Refill   acetaminophen (TYLENOL) 500 MG tablet Take 500 mg by mouth every 6 (six) hours as needed for moderate pain.     amLODipine (NORVASC) 10 MG tablet      aspirin EC 81 MG tablet Take 81 mg by mouth daily.     Bempedoic Acid 180 MG TABS Take 180 mg by mouth daily. 30 tablet 5   carvedilol (COREG) 12.5 MG tablet Take 1 tablet (12.5 mg total) by mouth 2 (two) times daily. 60 tablet 5   celecoxib (CELEBREX) 200 MG capsule      Colchicine 0.6 MG CAPS Take by mouth.     dapagliflozin propanediol (FARXIGA) 10 MG TABS tablet Take by mouth daily.     diclofenac Sodium (VOLTAREN) 1 % GEL      dicyclomine (BENTYL) 20 MG tablet      Dulaglutide (TRULICITY) A999333 0000000 SOPN      ENTRESTO 49-51 MG Take 1 tablet by mouth 2 (two) times daily.     escitalopram (LEXAPRO) 20 MG tablet Take 20  mg by mouth daily.     ferrous gluconate (FERGON) 324 MG tablet Take 1 tablet (324 mg total) by mouth daily with breakfast. 30 tablet 3   fluconazole (DIFLUCAN) 150 MG tablet Take 150 mg by mouth. Daily for 3 days     gabapentin (NEURONTIN) 100 MG capsule Take 1 capsule 3 times a day by oral route.     HYDROcodone-acetaminophen (NORCO/VICODIN) 5-325 MG tablet Take 1 tablet by mouth every 6 (six) hours as needed.     hydrOXYzine (ATARAX) 25 MG tablet Take 25 mg by mouth 2 (two) times daily as needed for anxiety.     LANTUS SOLOSTAR 100 UNIT/ML Solostar Pen Inject 38 Units into the skin daily.     lidocaine (LIDODERM) 5 % Place 1 patch onto the skin every 12 (twelve) hours. Remove & Discard patch within 12 hours or as directed by MD 10 patch 0   meloxicam (MOBIC) 7.5 MG tablet Take 1 tablet every day by oral route.     metFORMIN (GLUCOPHAGE-XR) 500 MG 24 hr tablet Take 1,000 mg by mouth 2 (two) times daily.     methocarbamol (ROBAXIN) 500 MG tablet 1-2 tablets  Every 6 hours as needed for muscle spasms 20 tablet 0   ondansetron (ZOFRAN) 4 MG tablet Take 1 tablet (4 mg total) by mouth daily as needed for nausea or vomiting. 30 tablet 1   pantoprazole (PROTONIX) 40 MG tablet Take 40 mg by mouth daily.  5   pravastatin (PRAVACHOL) 40 MG tablet      pregabalin (LYRICA) 100 MG capsule Take 100 mg by mouth 3 (three) times daily.     spironolactone (ALDACTONE) 25 MG tablet Take 1 tablet (25 mg total) by mouth daily. STOP taking your Amlodipine 90 tablet 1  torsemide (DEMADEX) 20 MG tablet Take 20 mg by mouth daily.     traMADol (ULTRAM) 50 MG tablet      trimethoprim (TRIMPEX) 100 MG tablet      ezetimibe (ZETIA) 10 MG tablet Take 1 tablet (10 mg total) by mouth daily. 90 tablet 3   No current facility-administered medications for this visit.    Review of Systems  Constitutional:  Positive for fatigue. Negative for appetite change, chills and fever.  HENT:   Negative for hearing loss and voice  change.   Eyes:  Negative for eye problems.  Respiratory:  Negative for chest tightness and cough.   Cardiovascular:  Negative for chest pain.  Gastrointestinal:  Negative for abdominal distention, abdominal pain and blood in stool.  Endocrine: Negative for hot flashes.  Genitourinary:  Negative for difficulty urinating and frequency.   Musculoskeletal:  Positive for back pain and flank pain. Negative for arthralgias.  Skin:  Negative for itching and rash.  Neurological:  Negative for extremity weakness.  Hematological:  Negative for adenopathy.  Psychiatric/Behavioral:  Negative for confusion.     PHYSICAL EXAMINATION: Vitals:   12/14/22 1117  BP: (!) 147/63  Pulse: 97  Resp: 18  Temp: (!) 97.3 F (36.3 C)   Filed Weights   12/14/22 1117  Weight: 214 lb 4.8 oz (97.2 kg)    Physical Exam Constitutional:      General: She is in acute distress.  HENT:     Head: Normocephalic and atraumatic.  Eyes:     General: No scleral icterus. Cardiovascular:     Rate and Rhythm: Normal rate.     Heart sounds: Normal heart sounds.  Pulmonary:     Effort: Pulmonary effort is normal. No respiratory distress.     Breath sounds: No wheezing.  Abdominal:     Palpations: Abdomen is soft.  Musculoskeletal:        General: No deformity. Normal range of motion.     Cervical back: Normal range of motion and neck supple.  Skin:    General: Skin is warm and dry.     Findings: No erythema or rash.  Neurological:     Mental Status: She is alert and oriented to person, place, and time. Mental status is at baseline.     Cranial Nerves: No cranial nerve deficit.     Coordination: Coordination normal.      LABORATORY DATA:  I have reviewed the data as listed    Latest Ref Rng & Units 12/14/2022    2:56 PM 12/14/2022   11:52 AM 10/08/2022    8:07 PM  CBC  WBC 4.0 - 10.5 K/uL 8.6  11.9  8.3   Hemoglobin 12.0 - 15.0 g/dL 13.4  10.3  11.3   Hematocrit 36.0 - 46.0 % 43.1  33.1  36.1    Platelets 150 - 400 K/uL 169  228  356       Latest Ref Rng & Units 12/14/2022    2:56 PM 12/14/2022   11:52 AM 10/08/2022    8:07 PM  CMP  Glucose 70 - 99 mg/dL 125  138  102   BUN 8 - 23 mg/dL '25  26  28   '$ Creatinine 0.44 - 1.00 mg/dL 1.12  1.04  1.08   Sodium 135 - 145 mmol/L 137  137  138   Potassium 3.5 - 5.1 mmol/L 3.6  3.8  4.1   Chloride 98 - 111 mmol/L 97  97  97  CO2 22 - 32 mmol/L '27  26  30   '$ Calcium 8.9 - 10.3 mg/dL 9.5  10.0  9.8   Total Protein 6.5 - 8.1 g/dL  7.6  7.6   Total Bilirubin 0.3 - 1.2 mg/dL  1.2  0.6   Alkaline Phos 38 - 126 U/L  74  75   AST 15 - 41 U/L  16  23   ALT 0 - 44 U/L  15  16       Component Value Date/Time   IRON 29 12/14/2022 1152   TIBC 304 12/14/2022 1152   FERRITIN 208 12/14/2022 1152   IRONPCTSAT 10 (L) 12/14/2022 1152     RADIOGRAPHIC STUDIES: I have personally reviewed the radiological images as listed and agreed with the findings in the report. CT Renal Stone Study  Result Date: 12/14/2022 CLINICAL DATA:  A 73 year old female presents for evaluation of abdominal pain and flank pain. EXAM: CT ABDOMEN AND PELVIS WITHOUT CONTRAST TECHNIQUE: Multidetector CT imaging of the abdomen and pelvis was performed following the standard protocol without IV contrast. RADIATION DOSE REDUCTION: This exam was performed according to the departmental dose-optimization program which includes automated exposure control, adjustment of the mA and/or kV according to patient size and/or use of iterative reconstruction technique. COMPARISON:  August 05, 2022 FINDINGS: Lower chest: Unremarkable aside from coronary artery calcium of LEFT coronary circulation, incompletely evaluated. Hepatobiliary: Smooth hepatic contours. No pericholecystic stranding. No gross biliary duct distension. No visible lesion on noncontrast imaging. Pancreas: Pancreas with normal contours, no signs of inflammation or peripancreatic fluid. 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 Adrenals/Urinary Tract: LEFT adrenal lesion at 29 Hounsfield units 12 x 16 mm, also stable since August of 2020 likely small adrenal adenoma. RIGHT adrenal gland is normal. Smooth renal contours. No substantial perinephric stranding or signs of hydronephrosis. No visible ureteral calculi or perivesical stranding. Nephrolithiasis on the LEFT in the lower pole at 6 mm. Stomach/Bowel: No perigastric stranding or signs of small bowel obstruction or inflammation. The appendix is normal. Mild diverticulosis. No signs of acute inflammation or colonic obstruction. Vascular/Lymphatic: Aortic atherosclerosis. No sign of aneurysm. Smooth contour of the IVC. There is no gastrohepatic or hepatoduodenal ligament lymphadenopathy. No retroperitoneal or mesenteric lymphadenopathy. No pelvic sidewall lymphadenopathy. Limited assessment of vascular structures due to lack of intravenous contrast. Reproductive: Post hysterectomy. Other: No ascites. No pneumoperitoneum. Postoperative changes about the suprapubic region likely related to prior hysterectomy or similar surgical procedure. Musculoskeletal: No acute bone finding. No destructive bone process. Spinal degenerative changes. L4 compression fracture without change. Robust degenerative changes of facets at L3-4 similar to prior imaging as well. IMPRESSION: 1. No acute findings in the abdomen or pelvis. 2. Nephrolithiasis on the LEFT in the lower pole at 6 mm. 3. LEFT adrenal lesion at 29 Hounsfield units, also stable since August of 2020 likely small adrenal adenoma. Consider follow-up in 1 year with adrenal protocol CT if not yet performed. Based on current clinical literature, biochemical evaluation to exclude possible functioning adrenal nodule is suggested if not already performed. Please refer to current clinical guidelines for detailed  recommendations. NEJM [id number] 4. 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 5. Mild diverticulosis. 6. Aortic atherosclerosis and  coronary artery disease. Aortic Atherosclerosis (ICD10-I70.0). Electronically Signed   By: Zetta Bills M.D.   On: 12/14/2022 17:03

## 2022-12-14 NOTE — Discharge Instructions (Addendum)
You are being treated for a urinary tract infection.  Take antibiotic as directed.  You also have evidence of a single kidney stone on the left side.  Take antibiotic as prescribed, pain and nausea medicine as needed.  Follow-up with your primary provider for ongoing symptoms.  Return to the ED for increasing pain, fevers, or bloody urine.

## 2022-12-14 NOTE — ED Provider Notes (Signed)
[facility] Emergency Department Provider Note     Event Date/Time   First MD Initiated Contact with Patient 12/14/22 1317     (approximate)   History   Back Pain   HPI  Betty Luna is a 72 y.o. female with a history of CKD, HTN, type II DM, CHF, presents to the ED for evaluation of right-sided flank pain.  Patient notes increasing back pain over the last few days.  She has a history of musculoskeletal back pain but notes this is different pain.  Patient reports treatment couple weeks ago for presumed UTI with antibiotic.  She denies any confirmed fevers but does note subjective fevers and chills.  Denies any hematuria or urinary retention.  Physical Exam   Triage Vital Signs: ED Triage Vitals  Enc Vitals Group     BP 12/14/22 1233 (!) 117/103     Pulse Rate 12/14/22 1233 97     Resp 12/14/22 1233 18     Temp 12/14/22 1233 98.8 F (37.1 C)     Temp src --      SpO2 12/14/22 1233 100 %     Weight --      Height --      Head Circumference --      Peak Flow --      Pain Score 12/14/22 1232 10     Pain Loc --      Pain Edu? --      Excl. in [place]? --     Most recent vital signs: Vitals:   12/14/22 1233 12/14/22 1701  BP: (!) 117/103 (!) 116/98  Pulse: 97 96  Resp: 18 18  Temp: 98.8 F (37.1 C) 98.1 F (36.7 C)  SpO2: 100% 100%    General Awake, no distress. NAD CV:  Good peripheral perfusion.  RESP:  Normal effort.  ABD:  No distention.    ED Results / Procedures / Treatments   Labs (all labs ordered are listed, but only abnormal results are displayed) Labs Reviewed  URINALYSIS, ROUTINE W REFLEX MICROSCOPIC - Abnormal; Notable for the following components:      Result Value   Color, Urine YELLOW (*)    APPearance HAZY (*)    Glucose, UA >=500 (*)    Hgb urine dipstick SMALL (*)    Protein, ur 30 (*)    Leukocytes,Ua MODERATE (*)    Bacteria, UA RARE (*)    All other components within normal limits  BASIC METABOLIC  PANEL - Abnormal; Notable for the following components:   Chloride 97 (*)    Glucose, Bld 125 (*)    BUN 25 (*)    Creatinine, Ser 1.12 (*)    GFR, Estimated 53 (*)    All other components within normal limits  CBC WITH DIFFERENTIAL/PLATELET - Abnormal; Notable for the following components:   RBC 5.84 (*)    MCV 73.8 (*)    MCH 22.9 (*)    RDW 17.0 (*)    All other components within normal limits  URINE CULTURE     EKG    RADIOLOGY  I personally viewed and evaluated these images as part of my medical decision making, as well as reviewing the written report by the radiologist.  ED Provider Interpretation: no signs of hydronephrosis or pyelonephritis. Left renal stone  CT Renal Stone Study  Result Date: 12/14/2022 CLINICAL DATA:  A 72 year old female presents for evaluation of abdominal pain and flank pain. EXAM: CT ABDOMEN AND  PELVIS WITHOUT CONTRAST TECHNIQUE: Multidetector CT imaging of the abdomen and pelvis was performed following the standard protocol without IV contrast. RADIATION DOSE REDUCTION: This exam was performed according to the departmental dose-optimization program which includes automated exposure control, adjustment of the mA and/or kV according to patient size and/or use of iterative reconstruction technique. COMPARISON:  August 05, 2022 FINDINGS: Lower chest: Unremarkable aside from coronary artery calcium of LEFT coronary circulation, incompletely evaluated. Hepatobiliary: Smooth hepatic contours. No pericholecystic stranding. No gross biliary duct distension. No visible lesion on noncontrast imaging. Pancreas: Pancreas with normal contours, no signs of inflammation or peripancreatic fluid. 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 Adrenals/Urinary Tract: LEFT adrenal lesion at 29 Hounsfield units 12 x 16 mm, also stable since  August of 2020 likely small adrenal adenoma. RIGHT adrenal gland is normal. Smooth renal contours. No substantial perinephric stranding or signs of hydronephrosis. No visible ureteral calculi or perivesical stranding. Nephrolithiasis on the LEFT in the lower pole at 6 mm. Stomach/Bowel: No perigastric stranding or signs of small bowel obstruction or inflammation. The appendix is normal. Mild diverticulosis. No signs of acute inflammation or colonic obstruction. Vascular/Lymphatic: Aortic atherosclerosis. No sign of aneurysm. Smooth contour of the IVC. There is no gastrohepatic or hepatoduodenal ligament lymphadenopathy. No retroperitoneal or mesenteric lymphadenopathy. No pelvic sidewall lymphadenopathy. Limited assessment of vascular structures due to lack of intravenous contrast. Reproductive: Post hysterectomy. Other: No ascites. No pneumoperitoneum. Postoperative changes about the suprapubic region likely related to prior hysterectomy or similar surgical procedure. Musculoskeletal: No acute bone finding. No destructive bone process. Spinal degenerative changes. L4 compression fracture without change. Robust degenerative changes of facets at L3-4 similar to prior imaging as well. IMPRESSION: 1. No acute findings in the abdomen or pelvis. 2. Nephrolithiasis on the LEFT in the lower pole at 6 mm. 3. LEFT adrenal lesion at 29 Hounsfield units, also stable since August of 2020 likely small adrenal adenoma. Consider follow-up in 1 year with adrenal protocol CT if not yet performed. Based on current clinical literature, biochemical evaluation to exclude possible functioning adrenal nodule is suggested if not already performed. Please refer to current clinical guidelines for detailed recommendations. NEJM [id number] 4. 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 5. Mild diverticulosis.  6. Aortic atherosclerosis and coronary artery disease. Aortic Atherosclerosis (ICD10-I70.0). Electronically Signed   By: Zetta Bills M.D.   On: 12/14/2022 17:03     PROCEDURES:  Critical Care performed: No  Procedures   MEDICATIONS ORDERED IN ED: Medications  sodium chloride 0.9 % bolus 1,000 mL (0 mLs Intravenous Stopped 12/14/22 1738)  fentaNYL (SUBLIMAZE) injection 50 mcg (50 mcg Intramuscular Given 12/14/22 1508)  ondansetron (ZOFRAN-ODT) disintegrating tablet 4 mg (4 mg Oral Given 12/14/22 1509)  cefTRIAXone (ROCEPHIN) 2 g in sodium chloride 0.9 % 100 mL IVPB (0 g Intravenous Stopped 12/14/22 1738)  ketorolac (TORADOL) 30 MG/ML injection 30 mg (30 mg Intravenous Given 12/14/22 1736)  phenazopyridine (PYRIDIUM) tablet 200 mg (200 mg Oral Given by Other 12/14/22 1756)     IMPRESSION / MDM / ASSESSMENT AND PLAN / ED COURSE  I reviewed the triage vital signs and the nursing notes.  Differential diagnosis includes, but is not limited to, acute appendicitis, diverticulitis, urinary tract infection/pyelonephritis, endometriosis, bowel obstruction, colitis, renal colic, gastroenteritis, hernia, etc.   Patient's presentation is most consistent with acute complicated illness / injury requiring diagnostic workup.  Patient's diagnosis is consistent with acute complicated UTI.  No radiologic evidence of any acute hydronephrosis, pyelonephritis, or urinary tract obstruction.  Patient with incidental finding of the left renal stone.  UA does confirm some pyuria/leukocyturia.  Patient treated with an empiric dose of Rocephin in the ED with pending urine culture.  She reports of improvement of her pain after IV medication administration and fluid bolus.  Patient will be discharged home with prescriptions for Keflex, Vicodin, Pyridium, and Zofran. Patient is to follow up with her primary provider as needed or otherwise directed. Patient is given ED precautions to return to  the ED for any worsening or new symptoms.   FINAL CLINICAL IMPRESSION(S) / ED DIAGNOSES   Final diagnoses:  Lower urinary tract infectious disease  Complicated UTI (urinary tract infection)     Rx / DC Orders   ED Discharge Orders          Ordered    cephALEXin (KEFLEX) 500 MG capsule  3 times daily        12/14/22 1743    ondansetron (ZOFRAN-ODT) 4 MG disintegrating tablet  Every 8 hours PRN        12/14/22 1743    HYDROcodone-acetaminophen (NORCO) 5-325 MG tablet  3 times daily PRN        12/14/22 1743    phenazopyridine (PYRIDIUM) 100 MG tablet  3 times daily PRN        12/14/22 1743             Note:  This document was prepared using Dragon voice recognition software and may include unintentional dictation errors.    Melvenia Needles, PA-C 12/14/22 1819    Rada Hay, MD 12/15/22 [phone]

## 2022-12-16 LAB — URINE CULTURE: Culture: >=100000 — AB

## 2022-12-17 LAB — KAPPA/LAMBDA LIGHT CHAINS
Kappa free light chain: 47.9 mg/L — ABNORMAL HIGH (ref 3.3–19.4)
Kappa, lambda light chain ratio: 1.61 (ref 0.26–1.65)
Lambda free light chains: 29.8 mg/L — ABNORMAL HIGH (ref 5.7–26.3)

## 2022-12-19 ENCOUNTER — Other Ambulatory Visit: Payer: Self-pay

## 2022-12-19 ENCOUNTER — Emergency Department
Admission: EM | Admit: 2022-12-19 | Discharge: 2022-12-19 | Disposition: A | Discharge status: Home / Self Care | Payer: 59 | Attending: Emergency Medicine | Admitting: Emergency Medicine

## 2022-12-19 DIAGNOSIS — E1122 Type 2 diabetes mellitus with diabetic chronic kidney disease: Secondary | ICD-10-CM | POA: Insufficient documentation

## 2022-12-19 DIAGNOSIS — M1009 Idiopathic gout, multiple sites: Secondary | ICD-10-CM | POA: Diagnosis not present

## 2022-12-19 DIAGNOSIS — I130 Hypertensive heart and chronic kidney disease with heart failure and stage 1 through stage 4 chronic kidney disease, or unspecified chronic kidney disease: Secondary | ICD-10-CM | POA: Diagnosis not present

## 2022-12-19 DIAGNOSIS — N189 Chronic kidney disease, unspecified: Secondary | ICD-10-CM | POA: Diagnosis not present

## 2022-12-19 DIAGNOSIS — M25521 Pain in right elbow: Secondary | ICD-10-CM | POA: Diagnosis present

## 2022-12-19 DIAGNOSIS — I509 Heart failure, unspecified: Secondary | ICD-10-CM | POA: Diagnosis not present

## 2022-12-19 LAB — HGB FRACTIONATION CASCADE
Hgb A2: 5.4 % — ABNORMAL HIGH (ref 1.8–3.2)
Hgb A: 94.2 % — ABNORMAL LOW (ref 96.4–98.8)
Hgb F: 0.4 % (ref 0.0–2.0)
Hgb S: 0 %

## 2022-12-19 MED ORDER — ONDANSETRON 4 MG PO TBDP
4.0000 mg | ORAL_TABLET | Freq: Once | ORAL | Status: AC
  Administered 2022-12-19: 4 mg via ORAL
  Filled 2022-12-19: qty 1

## 2022-12-19 MED ORDER — OXYCODONE-ACETAMINOPHEN 5-325 MG PO TABS: 1.0000 | ORAL_TABLET | Freq: Three times a day (TID) | ORAL | 0 refills | Status: AC | PRN

## 2022-12-19 MED ORDER — OXYCODONE-ACETAMINOPHEN 5-325 MG PO TABS
1.0000 | ORAL_TABLET | Freq: Once | ORAL | Status: AC
  Administered 2022-12-19: 1 via ORAL
  Filled 2022-12-19: qty 1

## 2022-12-19 NOTE — ED Notes (Signed)
Pt here via [facility] with gout. Pt c/o right foot and right elbow pain. Pt has hx of gout.   190-cbg

## 2022-12-19 NOTE — ED Provider Notes (Signed)
[facility] Emergency Department Provider Note     Event Date/Time   First MD Initiated Contact with Patient 12/19/22 1549     (approximate)   History   gout pain   HPI  Betty Luna is a 72 y.o. female with a history of CKD, HTN, type II DM, CHF, and gout, presents to the ED via EMS from home.  Patient reports acute pain to the right elbow, the right foot, and right knee for the last 3 days.  She reports pain with ambulation and range of motion.  No reported fevers, chills, sweats.  Patient also denies any recent injury, trauma, or falls.  Physical Exam   Triage Vital Signs: ED Triage Vitals  Enc Vitals Group     BP 12/19/22 1400 122/70     Pulse Rate 12/19/22 1400 (!) 104     Resp 12/19/22 1400 16     Temp 12/19/22 1400 98.8 F (37.1 C)     Temp Source 12/19/22 1400 Oral     SpO2 12/19/22 1400 97 %     Weight 12/19/22 1358 214 lb (97.1 kg)     Height 12/19/22 1358 '5\' 3"'$  (1.6 m)     Head Circumference --      Peak Flow --      Pain Score 12/19/22 1358 10     Pain Loc --      Pain Edu? --      Excl. in [place]? --     Most recent vital signs: Vitals:   12/19/22 1400 12/19/22 1735  BP: 122/70 122/72  Pulse: (!) 104 98  Resp: 16 16  Temp: 98.8 F (37.1 C) 98.4 F (36.9 C)  SpO2: 97% 97%    General Awake, no distress. NAD CV:  Good peripheral perfusion.  RESP:  Normal effort.  ABD:  No distention.  MSK:  Right foot with some exquisite soft tissue tenderness to palpation over the ankle joint.  Patient with also pain to the right CMC.   ED Results / Procedures / Treatments   Labs (all labs ordered are listed, but only abnormal results are displayed) Labs Reviewed - No data to display   EKG    RADIOLOGY   No results found.   PROCEDURES:  Critical Care performed: No  Procedures   MEDICATIONS ORDERED IN ED: Medications  ondansetron (ZOFRAN-ODT) disintegrating tablet 4 mg (4 mg Oral Given 12/19/22 1619)   oxyCODONE-acetaminophen (PERCOCET/ROXICET) 5-325 MG per tablet 1 tablet (1 tablet Oral Given 12/19/22 1618)     IMPRESSION / MDM / ASSESSMENT AND PLAN / ED COURSE  I reviewed the triage vital signs and the nursing notes.                              Differential diagnosis includes, but is not limited to, gout attack, osteoarthritis, arthralgias, septic joint, myalgias  Patient's presentation is most consistent with acute, uncomplicated illness.  Patient with a history of gout, presents to the ED with symptoms consistent with the same.  Patient with note that symptoms usually do occur in multiple joints.  She has been out of her [place] for the last few days.  Patient denies any fevers, chills, or sweats.  No concern for acute septic joint at this time.  No indication for further imaging or diagnostic testing.  Patient's diagnosis is consistent with acute gout. Patient will be discharged home with prescriptions for Percocet (#6).  Patient is to follow up with her primary provider as needed or otherwise directed. Patient is given ED precautions to return to the ED for any worsening or new symptoms.   FINAL CLINICAL IMPRESSION(S) / ED DIAGNOSES   Final diagnoses:  Acute idiopathic gout of multiple sites     Rx / DC Orders   ED Discharge Orders          Ordered    oxyCODONE-acetaminophen (PERCOCET) 5-325 MG tablet  Every 8 hours PRN        12/19/22 1723             Note:  This document was prepared using Dragon voice recognition software and may include unintentional dictation errors.    Melvenia Needles, PA-C 12/19/22 2323    Naaman Plummer, MD 12/19/22 2350

## 2022-12-19 NOTE — ED Triage Notes (Signed)
Pt to ED from home [facility] for attach of gout to R elbow, R foot, and R knee since 3 days ago, too painful to walk.

## 2022-12-19 NOTE — Discharge Instructions (Addendum)
Take the prescription pain medicine as needed. Follow-up with your primary provider as needed.

## 2022-12-20 LAB — MULTIPLE MYELOMA PANEL, SERUM
Albumin SerPl Elph-Mcnc: 3.5 g/dL (ref 2.9–4.4)
Albumin/Glob SerPl: 1.1 (ref 0.7–1.7)
Alpha 1: 0.3 g/dL (ref 0.0–0.4)
Alpha2 Glob SerPl Elph-Mcnc: 1.2 g/dL — ABNORMAL HIGH (ref 0.4–1.0)
B-Globulin SerPl Elph-Mcnc: 1 g/dL (ref 0.7–1.3)
Gamma Glob SerPl Elph-Mcnc: 0.9 g/dL (ref 0.4–1.8)
Globulin, Total: 3.4 g/dL (ref 2.2–3.9)
IgA: 154 mg/dL (ref 64–422)
IgG (Immunoglobin G), Serum: 918 mg/dL (ref 586–1602)
IgM (Immunoglobulin M), Srm: 75 mg/dL (ref 26–217)
M Protein SerPl Elph-Mcnc: 0.3 g/dL — ABNORMAL HIGH
Total Protein ELP: 6.9 g/dL (ref 6.0–8.5)

## 2022-12-25 DIAGNOSIS — M1A00X0 Idiopathic chronic gout, unspecified site, without tophus (tophi): Secondary | ICD-10-CM | POA: Insufficient documentation

## 2023-01-08 ENCOUNTER — Emergency Department: Payer: 59

## 2023-01-08 ENCOUNTER — Emergency Department
Admission: EM | Admit: 2023-01-08 | Discharge: 2023-01-08 | Disposition: A | Discharge status: Home / Self Care | Payer: 59 | Attending: Emergency Medicine | Admitting: Emergency Medicine

## 2023-01-08 ENCOUNTER — Encounter: Payer: Self-pay | Admitting: Emergency Medicine

## 2023-01-08 ENCOUNTER — Other Ambulatory Visit: Payer: Self-pay

## 2023-01-08 DIAGNOSIS — I509 Heart failure, unspecified: Secondary | ICD-10-CM | POA: Insufficient documentation

## 2023-01-08 DIAGNOSIS — I110 Hypertensive heart disease with heart failure: Secondary | ICD-10-CM | POA: Insufficient documentation

## 2023-01-08 DIAGNOSIS — R112 Nausea with vomiting, unspecified: Secondary | ICD-10-CM | POA: Insufficient documentation

## 2023-01-08 DIAGNOSIS — E119 Type 2 diabetes mellitus without complications: Secondary | ICD-10-CM | POA: Insufficient documentation

## 2023-01-08 DIAGNOSIS — N390 Urinary tract infection, site not specified: Secondary | ICD-10-CM | POA: Diagnosis not present

## 2023-01-08 DIAGNOSIS — R197 Diarrhea, unspecified: Secondary | ICD-10-CM

## 2023-01-08 DIAGNOSIS — R110 Nausea: Secondary | ICD-10-CM

## 2023-01-08 LAB — URINALYSIS, ROUTINE W REFLEX MICROSCOPIC
Bilirubin Urine: NEGATIVE
Glucose, UA: >=500 mg/dL — AB
Hgb urine dipstick: NEGATIVE
Ketones, ur: NEGATIVE mg/dL
Nitrite: POSITIVE — AB
Protein, ur: NEGATIVE mg/dL
Specific Gravity, Urine: 1.023 (ref 1.005–1.030)
pH: 5 (ref 5.0–8.0)

## 2023-01-08 LAB — COMPREHENSIVE METABOLIC PANEL
ALT: 22 U/L (ref 0–44)
AST: 31 U/L (ref 15–41)
Albumin: 3.8 g/dL (ref 3.5–5.0)
Alkaline Phosphatase: 102 U/L (ref 38–126)
Anion gap: 14 (ref 5–15)
BUN: 18 mg/dL (ref 8–23)
CO2: 21 mmol/L — ABNORMAL LOW (ref 22–32)
Calcium: 9.9 mg/dL (ref 8.9–10.3)
Chloride: 102 mmol/L (ref 98–111)
Creatinine, Ser: 1.33 mg/dL — ABNORMAL HIGH (ref 0.44–1.00)
GFR, Estimated: 43 mL/min — ABNORMAL LOW (ref >60)
Glucose, Bld: 241 mg/dL — ABNORMAL HIGH (ref 70–99)
Potassium: 3.2 mmol/L — ABNORMAL LOW (ref 3.5–5.1)
Sodium: 137 mmol/L (ref 135–145)
Total Bilirubin: 0.5 mg/dL (ref 0.3–1.2)
Total Protein: 7.8 g/dL (ref 6.5–8.1)

## 2023-01-08 LAB — LIPASE, BLOOD: Lipase: 29 U/L (ref 11–51)

## 2023-01-08 LAB — CBC
HCT: 37 % (ref 36.0–46.0)
Hemoglobin: 11.5 g/dL — ABNORMAL LOW (ref 12.0–15.0)
MCH: 22.8 pg — ABNORMAL LOW (ref 26.0–34.0)
MCHC: 31.1 g/dL (ref 30.0–36.0)
MCV: 73.4 fL — ABNORMAL LOW (ref 80.0–100.0)
Platelets: 303 10*3/uL (ref 150–400)
RBC: 5.04 MIL/uL (ref 3.87–5.11)
RDW: 16.8 % — ABNORMAL HIGH (ref 11.5–15.5)
WBC: 6.9 10*3/uL (ref 4.0–10.5)
nRBC: 0 % (ref 0.0–0.2)

## 2023-01-08 MED ORDER — METOCLOPRAMIDE HCL 5 MG/ML IJ SOLN
10.0000 mg | Freq: Once | INTRAMUSCULAR | Status: AC
  Administered 2023-01-08: 10 mg via INTRAVENOUS
  Filled 2023-01-08: qty 2

## 2023-01-08 MED ORDER — SODIUM CHLORIDE 0.9 % IV BOLUS
1000.0000 mL | Freq: Once | INTRAVENOUS | Status: AC
  Administered 2023-01-08: 1000 mL via INTRAVENOUS

## 2023-01-08 MED ORDER — METOCLOPRAMIDE HCL 10 MG PO TABS: 10.0000 mg | ORAL_TABLET | Freq: Three times a day (TID) | ORAL | 0 refills | Status: DC | PRN

## 2023-01-08 MED ORDER — POTASSIUM CHLORIDE CRYS ER 20 MEQ PO TBCR
40.0000 meq | EXTENDED_RELEASE_TABLET | Freq: Once | ORAL | Status: DC
  Filled 2023-01-08: qty 2

## 2023-01-08 MED ORDER — CEPHALEXIN 500 MG PO CAPS
500.0000 mg | ORAL_CAPSULE | Freq: Once | ORAL | Status: AC
  Administered 2023-01-08: 500 mg via ORAL
  Filled 2023-01-08: qty 1

## 2023-01-08 MED ORDER — CEPHALEXIN 500 MG PO CAPS: 500.0000 mg | ORAL_CAPSULE | Freq: Four times a day (QID) | ORAL | 0 refills | Status: AC

## 2023-01-08 MED ORDER — IOHEXOL 350 MG/ML SOLN
75.0000 mL | Freq: Once | INTRAVENOUS | Status: AC | PRN
  Administered 2023-01-08: 75 mL via INTRAVENOUS

## 2023-01-08 NOTE — ED Provider Notes (Signed)
[facility] Provider Note    Event Date/Time   First MD Initiated Contact with Patient 01/08/23 1424     (approximate)   History   Diarrhea   HPI  Genises Scaduto is a 72 y.o. female   Past medical history of CHF, gout, diabetes, depression, hypertension presents emergency department with nausea vomiting diarrhea.  This is occurred over the last 2 days.  She has stated that she feels very weak lightheaded and has had poor p.o. intake due to nausea but no vomiting.  She reports no abdominal pain or fever.  No dysuria or frequency.  She has no chest pain / shortness of breath.   External Medical Documents Reviewed: Emergency department visit dated December 19, 2022 with gout      Physical Exam   Triage Vital Signs: ED Triage Vitals [01/08/23 1311]  Enc Vitals Group     BP (!) 144/76     Pulse Rate (!) 113     Resp 18     Temp 97.9 F (36.6 C)     Temp Source Oral     SpO2 97 %     Weight      Height      Head Circumference      Peak Flow      Pain Score 0     Pain Loc      Pain Edu?      Excl. in [place]?     Most recent vital signs: Vitals:   01/08/23 1311 01/08/23 1521  BP: (!) 144/76 122/63  Pulse: (!) 113 83  Resp: 18 18  Temp: 97.9 F (36.6 C)   SpO2: 97% 100%    General: Awake, no distress.  CV:  Good peripheral perfusion.  Resp:  Normal effort.  Abd:  No distention.  Other:  Awake alert comfortable appearing appears slightly dehydrated with dry mucous membranes poor skin turgor.  Hemodynamics appropriate reassuring, no hypoxemia lungs clear abdomen soft but has some mild tenderness to palpation suprapubic and bilateral lower quadrants.  ED Results / Procedures / Treatments   Labs (all labs ordered are listed, but only abnormal results are displayed) Labs Reviewed  COMPREHENSIVE METABOLIC PANEL - Abnormal; Notable for the following components:      Result Value   Potassium 3.2 (*)    CO2 21 (*)    Glucose, Bld 241  (*)    Creatinine, Ser 1.33 (*)    GFR, Estimated 43 (*)    All other components within normal limits  CBC - Abnormal; Notable for the following components:   Hemoglobin 11.5 (*)    MCV 73.4 (*)    MCH 22.8 (*)    RDW 16.8 (*)    All other components within normal limits  URINALYSIS, ROUTINE W REFLEX MICROSCOPIC - Abnormal; Notable for the following components:   Color, Urine YELLOW (*)    APPearance HAZY (*)    Glucose, UA >=500 (*)    Nitrite POSITIVE (*)    Leukocytes,Ua LARGE (*)    Bacteria, UA FEW (*)    All other components within normal limits  LIPASE, BLOOD     I ordered and reviewed the above labs they are notable for bacteriuria with leukocytes and a creatinine is slightly elevated at 1.3 from 1.0    RADIOLOGY I independently reviewed and interpreted CT scan of the abdomen pelvis see no obvious obstructive or inflammatory changes   PROCEDURES:  Critical Care performed: No  Procedures  MEDICATIONS ORDERED IN ED: Medications  potassium chloride SA (KLOR-CON M) CR tablet 40 mEq (40 mEq Oral Patient Refused/Not Given 01/08/23 1528)  cephALEXin (KEFLEX) capsule 500 mg (has no administration in time range)  sodium chloride 0.9 % bolus 1,000 mL (1,000 mLs Intravenous Bolus 01/08/23 1528)  metoCLOPramide (REGLAN) injection 10 mg (10 mg Intravenous Given 01/08/23 1528)  iohexol (OMNIPAQUE) 350 MG/ML injection 75 mL (75 mLs Intravenous Contrast Given 01/08/23 1534)     IMPRESSION / MDM / ASSESSMENT AND PLAN / ED COURSE  I reviewed the triage vital signs and the nursing notes.                                Patient's presentation is most consistent with acute presentation with potential threat to life or bodily function.  Differential diagnosis includes, but is not limited to, appendicitis, diverticulitis, urinary tract infection   The patient is on the cardiac monitor to evaluate for evidence of arrhythmia and/or significant heart rate changes.  MDM: This is  a patient with viral gastroenteritis symptoms nausea and profuse watery diarrhea over the last 2 days and symptoms of dehydration with dizziness, weakness, elevated creatinine and dry mucous membranes consistent with hypovolemia.  I will give her an IV crystalloid bolus given her dehydrated state and despite her CHF status I think she can take some fluids today.  CT scan of the abdomen pelvis to assess for surgical pathology like appendicitis or diverticulitis complicated, but is negative.  Her urinalysis shows bacteria and leukocytes concerning for urinary tract infection so I will give her antibiotics.  I considered hospitalization for admission or observation however given her stability and negative CT scan for surgical pathologies and a plan for outpatient antibiotics I think outpatient treatment follow-up most appropriate this time she is in agreement.        FINAL CLINICAL IMPRESSION(S) / ED DIAGNOSES   Final diagnoses:  Nausea  Diarrhea, unspecified type  Lower urinary tract infection     Rx / DC Orders   ED Discharge Orders          Ordered    metoCLOPramide (REGLAN) 10 MG tablet  Every 8 hours PRN        01/08/23 1502    cephALEXin (KEFLEX) 500 MG capsule  4 times daily        01/08/23 1554             Note:  This document was prepared using Dragon voice recognition software and may include unintentional dictation errors.    Lucillie Garfinkel, MD 01/08/23 [phone]

## 2023-01-08 NOTE — ED Triage Notes (Signed)
Patient to ED via POV for diarrhea since yesterday at 2pm until 2am. Patient states she is feeling weak and tired since. No appetite to eat.

## 2023-01-08 NOTE — Discharge Instructions (Addendum)
Take antibiotics for urine infection.  Take Reglan as needed for nausea.  Thank you for choosing [place] for your health care today!  Please see your primary doctor this week for a follow up appointment.   Sometimes, in the early stages of certain disease courses it is difficult to detect in the emergency department evaluation -- so, it is important that you continue to monitor your symptoms and call your doctor right away or return to the emergency department if you develop any new or worsening symptoms.  Please go to the following website to schedule new (and existing) patient appointments:   [email or web address]  If you do not have a primary doctor try calling the following clinics to establish care:  If you have insurance:  [facility] [phone] [place] [address]   [facility]  [phone] [place] [place] [address]   If you do not have insurance:  [facility]  [phone] [address] [place] [address]   The following is another list of primary care offices in the area who are accepting new patients at this time.  Please reach out to one of them directly and let them know you would like to schedule an appointment to follow up on an Emergency Department visit, and/or to establish a new primary care provider (PCP).  There are likely other primary care clinics in the are who are accepting new patients, but this is an excellent place to start:  [place] physician: Dr Lavon Paganini [address] [place], [place] [address] [phone]  [facility] Lead Physician: Dr Steele Sizer [address], [place], [place] [address] [phone]  [place] Physician: Dr Park Liter [address] [place], [place] [address] [phone]  [facility] Lead Physician: Dr Dewaine Oats [place], [place], [place] [address] [phone]  [facility] Physician: Dr Halina Maidens [address] Colin Broach [place], [place] [address] [phone]   It was my pleasure to care for you today.   Hoover Brunette Jacelyn Grip, MD

## 2023-01-11 ENCOUNTER — Inpatient Hospital Stay: Payer: 59 | Attending: Oncology | Admitting: Oncology

## 2023-01-11 NOTE — Assessment & Plan Note (Deleted)
Labs are reviewed and discussed with patient. Ferritin level is elevated, but could be falsely elevated due to acute phase reactant.  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  Check cbc , cmp, iron tibc ferritin, myeloma panel, light chain ratio, B12, retic panel, LDH. Hemoglobinopathy evaluation.   

## 2023-01-31 ENCOUNTER — Encounter: Payer: Self-pay | Admitting: Oncology

## 2023-01-31 ENCOUNTER — Inpatient Hospital Stay: Payer: 59 | Attending: Oncology | Admitting: Oncology

## 2023-01-31 ENCOUNTER — Inpatient Hospital Stay: Payer: 59

## 2023-01-31 ENCOUNTER — Telehealth: Payer: Self-pay

## 2023-01-31 VITALS — BP 115/63 | HR 89 | Temp 98.1°F | Resp 18 | Wt 210.4 lb

## 2023-01-31 DIAGNOSIS — E859 Amyloidosis, unspecified: Secondary | ICD-10-CM

## 2023-01-31 DIAGNOSIS — N183 Chronic kidney disease, stage 3 unspecified: Secondary | ICD-10-CM | POA: Diagnosis not present

## 2023-01-31 DIAGNOSIS — E538 Deficiency of other specified B group vitamins: Secondary | ICD-10-CM

## 2023-01-31 DIAGNOSIS — I130 Hypertensive heart and chronic kidney disease with heart failure and stage 1 through stage 4 chronic kidney disease, or unspecified chronic kidney disease: Secondary | ICD-10-CM | POA: Diagnosis not present

## 2023-01-31 DIAGNOSIS — Z794 Long term (current) use of insulin: Secondary | ICD-10-CM | POA: Diagnosis not present

## 2023-01-31 DIAGNOSIS — I509 Heart failure, unspecified: Secondary | ICD-10-CM | POA: Diagnosis not present

## 2023-01-31 DIAGNOSIS — D472 Monoclonal gammopathy: Secondary | ICD-10-CM | POA: Diagnosis present

## 2023-01-31 DIAGNOSIS — Z7984 Long term (current) use of oral hypoglycemic drugs: Secondary | ICD-10-CM | POA: Diagnosis not present

## 2023-01-31 DIAGNOSIS — Z79899 Other long term (current) drug therapy: Secondary | ICD-10-CM | POA: Insufficient documentation

## 2023-01-31 DIAGNOSIS — D509 Iron deficiency anemia, unspecified: Secondary | ICD-10-CM | POA: Diagnosis present

## 2023-01-31 DIAGNOSIS — E1122 Type 2 diabetes mellitus with diabetic chronic kidney disease: Secondary | ICD-10-CM | POA: Diagnosis not present

## 2023-01-31 DIAGNOSIS — D563 Thalassemia minor: Secondary | ICD-10-CM | POA: Diagnosis not present

## 2023-01-31 DIAGNOSIS — Z7982 Long term (current) use of aspirin: Secondary | ICD-10-CM | POA: Diagnosis not present

## 2023-01-31 DIAGNOSIS — Z87891 Personal history of nicotine dependence: Secondary | ICD-10-CM | POA: Diagnosis not present

## 2023-01-31 DIAGNOSIS — N1831 Chronic kidney disease, stage 3a: Secondary | ICD-10-CM

## 2023-01-31 MED ORDER — FERROUS GLUCONATE 324 (38 FE) MG PO TABS: 324.0000 mg | ORAL_TABLET | Freq: Every day | ORAL | 5 refills | Status: DC

## 2023-01-31 MED ORDER — CYANOCOBALAMIN 1000 MCG/ML IJ SOLN
1000.0000 ug | Freq: Once | INTRAMUSCULAR | Status: AC
  Administered 2023-01-31: 1000 ug via INTRAMUSCULAR
  Filled 2023-01-31: qty 1

## 2023-01-31 NOTE — Assessment & Plan Note (Addendum)
Labs are reviewed and discussed with patient. Ferritin level is elevated, but could be falsely elevated due to acute phase reactant.  Iron saturation is low, I recommend patient to take oral iron supplementation repeat level at next visit to see if it helps to normalize the level.

## 2023-01-31 NOTE — Assessment & Plan Note (Addendum)
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 Less frequently, these patients progress to AL amyloidosis, light chain deposition disease, or another lymphoproliferative disorder. For now I recommend observation. Check Myeloma panel and light chain ratio every 6 months.

## 2023-01-31 NOTE — Assessment & Plan Note (Addendum)
I have a discussion with the patient about the diagnosis. No intervention needed for her. This may attribute to her chronic microcytosis.  We also discussed about the implication of genetic screening for her biological relatives who are planning for a family.

## 2023-01-31 NOTE — Assessment & Plan Note (Signed)
B12 remains low in 200s, despite oral B12 2000mg  daily.  Recommend B12 injection weekly x 4 followed by monthly

## 2023-01-31 NOTE — Assessment & Plan Note (Signed)
Encourage oral hydration and avoid nephrotoxins.   

## 2023-01-31 NOTE — Telephone Encounter (Signed)
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 does she have any diastolic CHF? do you think a cardiac MRI will be helpful? thanks.     Debbe Odea, MD yes she has diastolic function. with diagnosis of MGUS, I think a cardiac mri will be helpful to evaluate for amyloidosis. my office will schedule CMR. Thanks for reaching out!   *diastolic dysfunction   You were added by Debbe Odea, MD. 27 mins BA Debbe Odea, MD [place] please schedule CMR to evaluate amylodosis. Thanks   ZY Rickard Patience, MD Thank you! I agree.

## 2023-01-31 NOTE — Assessment & Plan Note (Signed)
I have sent a message to her cardiologist Dr. Azucena Cecil to see if any cardiac MRI maybe needed given that she has a lamda MGUS

## 2023-01-31 NOTE — Progress Notes (Signed)
Hematology/Oncology Consult note [phone] [phone]     CHIEF COMPLAINTS/REASON FOR VISIT:  Anemia, MGUS, low B12  ASSESSMENT & PLAN:  Microcytic anemia Labs are reviewed and discussed with patient. Ferritin level is elevated, but could be falsely elevated due to acute phase reactant.  Iron saturation is low, I recommend patient to take oral iron supplementation repeat level at next visit to see if it helps to normalize the level.   CKD (chronic kidney disease) stage 3, GFR 30-59 ml/min (HCC) Encourage oral hydration and avoid nephrotoxins.    Thalassemia carrier I have a discussion with the patient about the diagnosis. No intervention needed for her. This may attribute to her chronic microcytosis.  We also discussed about the implication of genetic screening for her biological relatives who are planning for a family.    MGUS (monoclonal gammopathy of unknown significance) I discussed with patient about the diagnosis of IgG lamda MGUS which is an asymptomatic condition which has a small risk of progression to smoldering multiple myeloma and to symptomatic multiple myeloma. Less frequently, these patients progress to AL amyloidosis, light chain deposition disease, or another lymphoproliferative disorder. For now I recommend observation. Check Myeloma panel and light chain ratio every 6 months.   Congestive heart failure, unspecified HF chronicity, unspecified heart failure type I have sent a message to her cardiologist Dr. Azucena Cecil to see if any cardiac MRI maybe needed given that she has a lamda MGUS  Low serum vitamin B12 B12 remains low in 200s, despite oral B12 2000mg  daily.  Recommend B12 injection weekly x 4 followed by monthly  Orders Placed This Encounter  Procedures   CMP (Cancer Center only)    Standing Status:   Future    Standing Expiration Date:   01/31/2024   CBC with Differential (Cancer Center Only)    Standing Status:   Future     Standing Expiration Date:   01/31/2024   Iron and TIBC    Standing Status:   Future    Standing Expiration Date:   01/31/2024   Ferritin    Standing Status:   Future    Standing Expiration Date:   01/31/2024   Multiple Myeloma Panel (SPEP&IFE w/QIG)    Standing Status:   Future    Standing Expiration Date:   01/31/2024   Kappa/lambda light chains    Standing Status:   Future    Standing Expiration Date:   01/31/2024   Vitamin B12    Standing Status:   Future    Standing Expiration Date:   01/31/2024   Follow up in 6 months  All questions were answered. The patient knows to call the clinic with any problems, questions or concerns.  Rickard Patience, MD, PhD [facility] Hematology Oncology 01/31/2023     HISTORY OF PRESENTING ILLNESS:  Virgin Greisen is a  72 y.o.  female with PMH listed below who was referred to me for anemia Reviewed patient's recent labs that was done recently - scanned in media 11/29/2022 Ferritin 362, iron saturation 20%, TIBC 268, Hb 10.4,MCV 74, platelet 501.  Anemia is chronic since 06/2022. Chroniclly microcytic with mcv in low 70s.  + fatigue.  She denies recent chest pain on exertion, pre-syncopal episodes, or palpitations She had not noticed any recent bleeding such as epistaxis, hematuria or hematochezia.  Patient take Celebrex   Severe back pain.   09/12/22 MRI lumbar spine wo contrast showed  Subacute superior endplate compression fracture of L4 with approximately 20% height loss,  unchanged from recent CT. Minimal residual marrow edema. No significant bony retropulsion.  Periarticular marrow edema at the left L3-L4 facet with adjacent soft tissue edema, likely reactive and/or related to facet arthritis. Degenerative changes of the lumbar spine, most prominent L3-L4 and L4-L5. Moderate-severe left lateral recess and neural foraminal stenosis at L3-L4, with potential for impingement the descending left L4 and exiting left L3 nerve roots. Mild to moderate right  lateral recess stenosis. Moderate left and mild-to-moderate right neural foraminal stenosis at L4-L5. Degenerative grade 1 anterolisthesis at this level.  INTERVAL HISTORY Clyde Lundborgatricia Hagy is a 72 y.o. female who has above history reviewed by me today presents for follow up visit to review results.  No new complaints.     MEDICAL HISTORY:  Past Medical History:  Diagnosis Date   Anemia    Arthritis    CHF (congestive heart failure)    Depression    Diabetes mellitus without complication    GERD (gastroesophageal reflux disease)    Gout    Hypertension     SURGICAL HISTORY: Past Surgical History:  Procedure Laterality Date   ABDOMINAL HYSTERECTOMY     CATARACT EXTRACTION W/PHACO Right 04/16/2017   Procedure: CATARACT EXTRACTION PHACO AND INTRAOCULAR LENS PLACEMENT (IOC);  Surgeon: Galen ManilaPorfilio, William, MD;  Location: [facility] ORS;  Service: Ophthalmology;  Laterality: Right;  US 01:06 AP% 16.5 CDE 11.03 Fluid pack lot # [id number]   CATARACT EXTRACTION W/PHACO Left 10/01/2017   Procedure: CATARACT EXTRACTION PHACO AND INTRAOCULAR LENS PLACEMENT (IOC)-LEFT DIABETIC;  Surgeon: Galen ManilaPorfilio, William, MD;  Location: [facility] ORS;  Service: Ophthalmology;  Laterality: Left;  US 01:05.7 AP% 12.2 CDE 7.42 Fluid pack lot # [id number]   CTR Left    EYE SURGERY Left    RIGHT/LEFT HEART CATH AND CORONARY ANGIOGRAPHY Bilateral 01/01/2022   Procedure: RIGHT/LEFT HEART CATH AND CORONARY ANGIOGRAPHY;  Surgeon: Iran OuchArida, Muhammad A, MD;  Location: [facility] INVASIVE CV LAB;  Service: Cardiovascular;  Laterality: Bilateral;   TONSILLECTOMY      SOCIAL HISTORY: Social History   Socioeconomic History   Marital status: Married    Spouse name: Not on file   Number of children: Not on file   Years of education: Not on file   Highest education level: Not on file  Occupational History   Not on file  Tobacco Use   Smoking status: Former    Packs/day: .5    Types: Cigarettes    Quit date: 2006    Years since  quitting: 18.2   Smokeless tobacco: Current    Types: Snuff  Vaping Use   Vaping Use: Never used  Substance and Sexual Activity   Alcohol use: Yes    Comment: occasonally   Drug use: No   Sexual activity: Not Currently  Other Topics Concern   Not on file  Social History Narrative   Not on file   Social Determinants of Health   Financial Resource Strain: Not on file  Food Insecurity: No Food Insecurity (12/14/2022)   Hunger Vital Sign    Worried About Running Out of Food in the Last Year: Never true    Ran Out of Food in the Last Year: Never true  Transportation Needs: No Transportation Needs (12/14/2022)   PRAPARE - Administrator, Civil ServiceTransportation    Lack of Transportation (Medical): No    Lack of Transportation (Non-Medical): No  Physical Activity: Not on file  Stress: Not on file  Social Connections: Not on file  Intimate Partner Violence: Not At Risk (12/14/2022)   Humiliation,  Afraid, Rape, and Kick questionnaire    Fear of Current or Ex-Partner: No    Emotionally Abused: No    Physically Abused: No    Sexually Abused: No    FAMILY HISTORY: Family History  Problem Relation Age of Onset   Breast cancer Sister     ALLERGIES:  is allergic to omeprazole, tramadol, atorvastatin, and codeine.  MEDICATIONS:  Current Outpatient Medications  Medication Sig Dispense Refill   allopurinol (ZYLOPRIM) 100 MG tablet Take 200 mg by mouth daily.     aspirin EC 81 MG tablet Take 81 mg by mouth daily.     Bempedoic Acid 180 MG TABS Take 180 mg by mouth daily. 30 tablet 5   Colchicine 0.6 MG CAPS Take by mouth.     cyanocobalamin (VITAMIN B12) 1000 MCG tablet Take 2 tablets by mouth daily.     dapagliflozin propanediol (FARXIGA) 10 MG TABS tablet Take by mouth daily.     Dulaglutide (TRULICITY) 0.75 MG/0.5ML SOPN      escitalopram (LEXAPRO) 20 MG tablet Take 20 mg by mouth daily.     metFORMIN (GLUCOPHAGE-XR) 500 MG 24 hr tablet Take 1,000 mg by mouth 2 (two) times daily.     pantoprazole  (PROTONIX) 40 MG tablet Take 40 mg by mouth daily.  5   pregabalin (LYRICA) 100 MG capsule Take 100 mg by mouth 3 (three) times daily.     sacubitril-valsartan (ENTRESTO) 97-103 MG Take 1 tablet by mouth 2 (two) times daily.     spironolactone (ALDACTONE) 25 MG tablet Take 1 tablet (25 mg total) by mouth daily. STOP taking your Amlodipine 90 tablet 1   torsemide (DEMADEX) 20 MG tablet Take 20 mg by mouth daily.     acetaminophen (TYLENOL) 500 MG tablet Take 500 mg by mouth every 6 (six) hours as needed for moderate pain. (Patient not taking: Reported on 01/31/2023)     amLODipine (NORVASC) 10 MG tablet  (Patient not taking: Reported on 01/31/2023)     carvedilol (COREG) 12.5 MG tablet Take 1 tablet (12.5 mg total) by mouth 2 (two) times daily. (Patient not taking: Reported on 01/31/2023) 60 tablet 5   celecoxib (CELEBREX) 200 MG capsule  (Patient not taking: Reported on 01/31/2023)     diclofenac Sodium (VOLTAREN) 1 % GEL  (Patient not taking: Reported on 01/31/2023)     dicyclomine (BENTYL) 20 MG tablet  (Patient not taking: Reported on 01/31/2023)     ezetimibe (ZETIA) 10 MG tablet Take 1 tablet (10 mg total) by mouth daily. (Patient not taking: Reported on 01/31/2023) 90 tablet 3   ferrous gluconate (FERGON) 324 MG tablet Take 1 tablet (324 mg total) by mouth daily with breakfast. 30 tablet 5   fluconazole (DIFLUCAN) 150 MG tablet Take 150 mg by mouth. Daily for 3 days (Patient not taking: Reported on 01/31/2023)     gabapentin (NEURONTIN) 100 MG capsule Take 1 capsule 3 times a day by oral route. (Patient not taking: Reported on 01/31/2023)     hydrOXYzine (ATARAX) 25 MG tablet Take 25 mg by mouth 2 (two) times daily as needed for anxiety. (Patient not taking: Reported on 01/31/2023)     LANTUS SOLOSTAR 100 UNIT/ML Solostar Pen Inject 38 Units into the skin daily. (Patient not taking: Reported on 01/31/2023)     lidocaine (LIDODERM) 5 % Place 1 patch onto the skin every 12 (twelve) hours. Remove & Discard  patch within 12 hours or as directed by MD (Patient not taking: Reported on  01/31/2023) 10 patch 0   meloxicam (MOBIC) 7.5 MG tablet Take 1 tablet every day by oral route. (Patient not taking: Reported on 01/31/2023)     methocarbamol (ROBAXIN) 500 MG tablet 1-2 tablets  Every 6 hours as needed for muscle spasms (Patient not taking: Reported on 01/31/2023) 20 tablet 0   metoCLOPramide (REGLAN) 10 MG tablet Take 1 tablet (10 mg total) by mouth every 8 (eight) hours as needed for nausea or vomiting. (Patient not taking: Reported on 01/31/2023) 90 tablet 0   ondansetron (ZOFRAN-ODT) 4 MG disintegrating tablet Take 1 tablet (4 mg total) by mouth every 8 (eight) hours as needed for nausea or vomiting. (Patient not taking: Reported on 01/31/2023) 15 tablet 0   pravastatin (PRAVACHOL) 40 MG tablet  (Patient not taking: Reported on 01/31/2023)     trimethoprim (TRIMPEX) 100 MG tablet  (Patient not taking: Reported on 01/31/2023)     No current facility-administered medications for this visit.    Review of Systems  Constitutional:  Positive for fatigue. Negative for appetite change, chills and fever.  HENT:   Negative for hearing loss and voice change.   Eyes:  Negative for eye problems.  Respiratory:  Negative for chest tightness and cough.   Cardiovascular:  Negative for chest pain.  Gastrointestinal:  Negative for abdominal distention, abdominal pain and blood in stool.  Endocrine: Negative for hot flashes.  Genitourinary:  Negative for difficulty urinating and frequency.   Musculoskeletal:  Positive for back pain and flank pain. Negative for arthralgias.  Skin:  Negative for itching and rash.  Neurological:  Negative for extremity weakness.  Hematological:  Negative for adenopathy.  Psychiatric/Behavioral:  Negative for confusion.     PHYSICAL EXAMINATION: Vitals:   01/31/23 1054  BP: 115/63  Pulse: 89  Resp: 18  Temp: 98.1 F (36.7 C)   Filed Weights   01/31/23 1054  Weight: 210 lb 6.4 oz  (95.4 kg)    Physical Exam Constitutional:      General: She is in acute distress.  HENT:     Head: Normocephalic and atraumatic.  Eyes:     General: No scleral icterus. Cardiovascular:     Rate and Rhythm: Normal rate.     Heart sounds: Normal heart sounds.  Pulmonary:     Effort: Pulmonary effort is normal. No respiratory distress.     Breath sounds: No wheezing.  Abdominal:     Palpations: Abdomen is soft.  Musculoskeletal:        General: No deformity. Normal range of motion.     Cervical back: Normal range of motion and neck supple.  Skin:    General: Skin is warm and dry.     Findings: No erythema or rash.  Neurological:     Mental Status: She is alert and oriented to person, place, and time. Mental status is at baseline.     Cranial Nerves: No cranial nerve deficit.     Coordination: Coordination normal.      LABORATORY DATA:  I have reviewed the data as listed    Latest Ref Rng & Units 01/08/2023    1:13 PM 12/14/2022    2:56 PM 12/14/2022   11:52 AM  CBC  WBC 4.0 - 10.5 K/uL 6.9  8.6  11.9   Hemoglobin 12.0 - 15.0 g/dL 70.3  50.0  93.8   Hematocrit 36.0 - 46.0 % 37.0  43.1  33.1   Platelets 150 - 400 K/uL 303  169  228  Latest Ref Rng & Units 01/08/2023    1:13 PM 12/14/2022    2:56 PM 12/14/2022   11:52 AM  CMP  Glucose 70 - 99 mg/dL 161  096  045   BUN 8 - 23 mg/dL Creatinine 0.44 - 1.00 mg/dL 4.09  8.11  9.14   Sodium 135 - 145 mmol/L 137  137  137   Potassium 3.5 - 5.1 mmol/L 3.2  3.6  3.8   Chloride 98 - 111 mmol/L 102  97  97   CO2 22 - 32 mmol/L Calcium 8.9 - 10.3 mg/dL 9.9  9.5  78.2   Total Protein 6.5 - 8.1 g/dL 7.8   7.6   Total Bilirubin 0.3 - 1.2 mg/dL 0.5   1.2   Alkaline Phos 38 - 126 U/L 102   74   AST 15 - 41 U/L 31   16   ALT 0 - 44 U/L 22   15       Component Value Date/Time   IRON 29 12/14/2022 1152   TIBC 304 12/14/2022 1152   FERRITIN 208 12/14/2022 1152   IRONPCTSAT 10 (L) 12/14/2022 1152      RADIOGRAPHIC STUDIES: I have personally reviewed the radiological images as listed and agreed with the findings in the report. CT Abdomen Pelvis W Contrast  Result Date: 01/08/2023 CLINICAL DATA:  Acute abdominal pain. Diarrhea. Weakness. EXAM: CT ABDOMEN AND PELVIS WITH CONTRAST TECHNIQUE: Multidetector CT imaging of the abdomen and pelvis was performed using the standard protocol following bolus administration of intravenous contrast. RADIATION DOSE REDUCTION: This exam was performed according to the departmental dose-optimization program which includes automated exposure control, adjustment of the mA and/or kV according to patient size and/or use of iterative reconstruction technique. CONTRAST:  75mL OMNIPAQUE IOHEXOL 350 MG/ML SOLN COMPARISON:  Radiograph 1 month ago 12/14/2022 fall multiple additional prior exams reviewed dating back to at least 2020 FINDINGS: Lower chest: Minor atelectasis in the lingula. No pleural fluid. Hepatobiliary: No focal hepatic abnormality. The gallbladder is decompressed. No calcified gallstone or pericholecystic inflammation. No biliary dilatation. Pancreas: No ductal dilatation or inflammation. Spleen: Normal in size. Exophytic 2.3 cm structure from the posterior spleen is stable from prior exams, minimally hyperdense to splenic parenchyma, benign. Adrenals/Urinary Tract: 16 mm left adrenal nodule has been stable for multiple years, please reference prior recommendations for adrenal nodule workup. Normal right adrenal gland. 6 mm nonobstructing stone in the lower pole the left kidney. No hydronephrosis. Homogeneous renal enhancement. No suspicious renal lesion. The urinary bladder is near completely empty and not well assessed. Stomach/Bowel: The stomach is unremarkable. There is no bowel obstruction or inflammatory change. Normal appendix is visualized. Small to moderate volume of stool in the colon. Left colonic diverticulosis. No diverticulitis or acute colonic  inflammation. Vascular/Lymphatic: Aortic atherosclerosis without aneurysm. Patent portal and splenic veins. No enlarged lymph nodes in the abdomen or pelvis. Reproductive: Hysterectomy. No adnexal mass. Other: No ascites. No free air or focal fluid collection. Postsurgical change of the anterior abdominal wall. No abdominal wall hernia. Musculoskeletal: Prominent lumbar facet hypertrophy. Stable mild L4 compression deformity. There are no acute or suspicious osseous abnormalities. IMPRESSION: 1. No acute abnormality. 2. Colonic diverticulosis without diverticulitis. 3. Nonobstructing left renal stone. 4. Additional stable chronic findings as described. Aortic Atherosclerosis (ICD10-I70.0). Electronically Signed   By: Narda Rutherford M.D.   On: 01/08/2023 15:48

## 2023-02-01 ENCOUNTER — Ambulatory Visit: Payer: 59 | Admitting: Cardiology

## 2023-02-02 ENCOUNTER — Other Ambulatory Visit: Payer: Self-pay

## 2023-02-02 ENCOUNTER — Emergency Department
Admission: EM | Admit: 2023-02-02 | Discharge: 2023-02-02 | Disposition: A | Discharge status: Home / Self Care | Payer: 59 | Attending: Emergency Medicine | Admitting: Emergency Medicine

## 2023-02-02 DIAGNOSIS — M109 Gout, unspecified: Secondary | ICD-10-CM | POA: Insufficient documentation

## 2023-02-02 DIAGNOSIS — R2242 Localized swelling, mass and lump, left lower limb: Secondary | ICD-10-CM | POA: Diagnosis present

## 2023-02-02 DIAGNOSIS — I509 Heart failure, unspecified: Secondary | ICD-10-CM | POA: Diagnosis not present

## 2023-02-02 DIAGNOSIS — E119 Type 2 diabetes mellitus without complications: Secondary | ICD-10-CM | POA: Diagnosis not present

## 2023-02-02 DIAGNOSIS — I110 Hypertensive heart disease with heart failure: Secondary | ICD-10-CM | POA: Insufficient documentation

## 2023-02-02 MED ORDER — OXYCODONE-ACETAMINOPHEN 5-325 MG PO TABS
1.0000 | ORAL_TABLET | Freq: Once | ORAL | Status: AC
  Administered 2023-02-02: 1 via ORAL
  Filled 2023-02-02: qty 1

## 2023-02-02 MED ORDER — OXYCODONE-ACETAMINOPHEN 5-325 MG PO TABS: 1.0000 | ORAL_TABLET | ORAL | 0 refills | Status: DC | PRN

## 2023-02-02 MED ORDER — COLCHICINE 0.6 MG PO TABS: 0.6000 mg | ORAL_TABLET | Freq: Two times a day (BID) | ORAL | 0 refills | Status: DC

## 2023-02-02 NOTE — ED Provider Notes (Signed)
[facility] Provider Note    Event Date/Time   First MD Initiated Contact with Patient 02/02/23 1102     (approximate)  History   Chief Complaint: Foot Swelling  HPI  Betty Luna is a 72 y.o. female with a past medical history of arthritis, CHF, diabetes, gastric reflux, gout, hypertension, presents to the emergency department for left ankle pain.  According to the patient she has a long history of gout, thought she was taking her allopurinol but recently found out that she had not been taking it, she has since started it back up.  Since Monday she said it started with pain in her right ankle, the pain went away after restarting allopurinol but now she is experiencing pain in the left ankle with some swelling consistent with prior gout attacks.  Patient states she was having pain ambulating on the ankle so she came to the emergency department.  Denies any fever, afebrile in the emergency department 98.7.  Denies any trauma.  Physical Exam   Triage Vital Signs: ED Triage Vitals  Enc Vitals Group     BP 02/02/23 1034 136/80     Pulse Rate 02/02/23 1034 98     Resp 02/02/23 1034 16     Temp 02/02/23 1034 98.7 F (37.1 C)     Temp Source 02/02/23 1034 Oral     SpO2 02/02/23 1034 98 %     Weight --      Height --      Head Circumference --      Peak Flow --      Pain Score 02/02/23 1032 10     Pain Loc --      Pain Edu? --      Excl. in GC? --     Most recent vital signs: Vitals:   02/02/23 1034  BP: 136/80  Pulse: 98  Resp: 16  Temp: 98.7 F (37.1 C)  SpO2: 98%    General: Awake, no distress.  CV:  Good peripheral perfusion.  Regular rate and rhythm  Resp:  Normal effort.  Equal breath sounds bilaterally.  Abd:  No distention.  Soft, nontender.  No rebound or guarding. Other:  Moderate tenderness to palpation of left ankle with pain with range of motion somewhat warm to the touch.   ED Results / Procedures / Treatments   MEDICATIONS  ORDERED IN ED: Medications  oxyCODONE-acetaminophen (PERCOCET/ROXICET) 5-325 MG per tablet 1 tablet (has no administration in time range)     IMPRESSION / MDM / ASSESSMENT AND PLAN / ED COURSE  I reviewed the triage vital signs and the nursing notes.  Patient's presentation is most consistent with acute illness / injury with system symptoms.  Patient presents emergency department for left ankle pain and swelling consistent clinically with gout.  Patient states this feels identical to past episodes of gout she has experienced which often times affect her ankles per patient.  Patient states she has done well with colchicine in the past.  Will place the patient on colchicine as well as a short course of pain medication.  Patient has a walker that she can use at home.  I discussed return precautions for any fever or worsening pain swelling or development of redness.  Patient agreeable to plan.  FINAL CLINICAL IMPRESSION(S) / ED DIAGNOSES   Gout  Rx / DC Orders   Colchicine Percocet  Note:  This document was prepared using Dragon voice recognition software and may include unintentional dictation  errors.   Minna Antis, MD 02/02/23 [phone]

## 2023-02-02 NOTE — ED Triage Notes (Signed)
Pt presents via EMS c/o gout flare. Reports has not taken gouts meds. Reports bilateral foot swelling. Left > right.

## 2023-02-05 ENCOUNTER — Telehealth (HOSPITAL_COMMUNITY): Payer: Self-pay | Admitting: *Deleted

## 2023-02-05 NOTE — Telephone Encounter (Signed)
Reaching out to patient to offer assistance regarding upcoming cardiac imaging study; pt verbalizes understanding of appt date/time, parking situation and where to check in, and verified current allergies; name and call back number provided for further questions should they arise  Larey Brick RN Navigator Cardiac Imaging [facility] [phone] office [phone] cell  Patient reports claustrophobia but denies metal.  Reaching out to ordering provider for patient to obtain meds for claustrophobia.

## 2023-02-06 ENCOUNTER — Other Ambulatory Visit: Payer: Self-pay | Admitting: Cardiology

## 2023-02-06 ENCOUNTER — Ambulatory Visit
Admission: RE | Admit: 2023-02-06 | Discharge: 2023-02-06 | Disposition: A | Discharge status: Home / Self Care | Payer: 59 | Source: Ambulatory Visit | Attending: Cardiology | Admitting: Cardiology

## 2023-02-06 DIAGNOSIS — E859 Amyloidosis, unspecified: Secondary | ICD-10-CM | POA: Diagnosis present

## 2023-02-06 DIAGNOSIS — I428 Other cardiomyopathies: Secondary | ICD-10-CM

## 2023-02-06 MED ORDER — GADOBUTROL 1 MMOL/ML IV SOLN
13.0000 mL | Freq: Once | INTRAVENOUS | Status: AC | PRN
  Administered 2023-02-06: 13 mL via INTRAVENOUS

## 2023-02-06 NOTE — Telephone Encounter (Signed)
I was added to a secure chat regarding this patient on 02/05/23 with the CT nurse navigator, Christeen Douglas, and Betty Luna, CMA for Dr. Azucena Cecil.  Betty Luna advised me verbally that she had reached out to Dr. Azucena Cecil who was off site to request pre-meds for the patient claustrophobia.  Message received from Dr. Azucena Cecil stating we could call in: - Ativan 1 mg: take 1 tablet 30-60 minutes prior to her MRI.   Betty Luna inquired if I could follow up with the patient as she was out of the office yesterday afternoon.   I was not able to contact the patient until this morning to discuss the above. Per Betty Luna, she is currently having issues with gout and on pain medications. She is also taking Lexapro. I advised we could send in Ativan to take x 1 dose prior to her MRI this morning, but she would need to pick this up on the way to her 10:00 am MRI appointment and take it.  Per the patient, since she is taking pain medications and lexapro, she declined to take ativan. She advised if she knows there is an opening at the top and bottom of the scanner she feels she will be ok.   She would like to try having the MRI without the Ativan.   I advised the patient if she starts the test and is unable to complete due to anxiety, she will need to be rescheduled and try again with pre-meds.  The patient voices understanding and is agreeable.

## 2023-02-07 ENCOUNTER — Inpatient Hospital Stay: Payer: 59

## 2023-02-11 ENCOUNTER — Telehealth: Payer: Self-pay

## 2023-02-11 DIAGNOSIS — I5022 Chronic systolic (congestive) heart failure: Secondary | ICD-10-CM

## 2023-02-11 NOTE — Telephone Encounter (Signed)
Margrett Rud, [place] 02/11/2023  9:26 AM EDT Back to Top    Called patient. No answer. LMOV to call back.   Debbe Odea, MD 02/07/2023  4:12 PM EDT     No evidence for cardiac arrhythmia noted on CMR.  Ejection fraction severely reduced.  Continue medications as prescribed.  Please  refer to advanced heart failure clinic.  Thank

## 2023-02-11 NOTE — Telephone Encounter (Signed)
-----   Message from Brian Agbor-Etang, MD sent at 02/07/2023  4:12 PM EDT ----- No evidence for cardiac arrhythmia noted on CMR.  Ejection fraction severely reduced.  Continue medications as prescribed.  Please  refer to advanced heart failure clinic.  Thank 

## 2023-02-11 NOTE — Telephone Encounter (Signed)
-----   Message from Debbe Odea, MD sent at 02/07/2023  4:12 PM EDT ----- No evidence for cardiac arrhythmia noted on CMR.  Ejection fraction severely reduced.  Continue medications as prescribed.  Please  refer to advanced heart failure clinic.  Thank

## 2023-02-14 ENCOUNTER — Inpatient Hospital Stay: Payer: 59

## 2023-02-15 ENCOUNTER — Other Ambulatory Visit: Payer: Self-pay | Admitting: *Deleted

## 2023-02-15 MED ORDER — SPIRONOLACTONE 25 MG PO TABS: 25.0000 mg | ORAL_TABLET | Freq: Every day | ORAL | 1 refills | Status: DC

## 2023-02-18 ENCOUNTER — Encounter: Payer: Self-pay | Admitting: *Deleted

## 2023-02-19 ENCOUNTER — Ambulatory Visit (HOSPITAL_BASED_OUTPATIENT_CLINIC_OR_DEPARTMENT_OTHER): Payer: 59 | Admitting: Cardiology

## 2023-02-19 ENCOUNTER — Encounter: Payer: Self-pay | Admitting: Cardiology

## 2023-02-19 ENCOUNTER — Other Ambulatory Visit
Admission: RE | Admit: 2023-02-19 | Discharge: 2023-02-19 | Disposition: A | Discharge status: Home / Self Care | Payer: 59 | Source: Ambulatory Visit | Attending: Cardiology | Admitting: Cardiology

## 2023-02-19 VITALS — BP 132/66 | HR 84 | Resp 14 | Wt 206.0 lb

## 2023-02-19 DIAGNOSIS — E7800 Pure hypercholesterolemia, unspecified: Secondary | ICD-10-CM | POA: Diagnosis not present

## 2023-02-19 DIAGNOSIS — D472 Monoclonal gammopathy: Secondary | ICD-10-CM

## 2023-02-19 DIAGNOSIS — I10 Essential (primary) hypertension: Secondary | ICD-10-CM

## 2023-02-19 DIAGNOSIS — I5022 Chronic systolic (congestive) heart failure: Secondary | ICD-10-CM

## 2023-02-19 DIAGNOSIS — Z6839 Body mass index (BMI) 39.0-39.9, adult: Secondary | ICD-10-CM

## 2023-02-19 LAB — BASIC METABOLIC PANEL
Anion gap: 10 (ref 5–15)
BUN: 17 mg/dL (ref 8–23)
CO2: 25 mmol/L (ref 22–32)
Calcium: 9.6 mg/dL (ref 8.9–10.3)
Chloride: 102 mmol/L (ref 98–111)
Creatinine, Ser: 0.81 mg/dL (ref 0.44–1.00)
GFR, Estimated: >60 mL/min (ref >60)
Glucose, Bld: 138 mg/dL — ABNORMAL HIGH (ref 70–99)
Potassium: 4.1 mmol/L (ref 3.5–5.1)
Sodium: 137 mmol/L (ref 135–145)

## 2023-02-19 LAB — BRAIN NATRIURETIC PEPTIDE: B Natriuretic Peptide: 115.5 pg/mL — ABNORMAL HIGH (ref 0.0–100.0)

## 2023-02-19 MED ORDER — ENTRESTO 49-51 MG PO TABS: 1.0000 | ORAL_TABLET | Freq: Two times a day (BID) | ORAL | 6 refills | Status: DC

## 2023-02-19 NOTE — Progress Notes (Signed)
ADVANCED HEART FAILURE CLINIC NOTE  Referring Physician: Ethelda Chick, MD  Primary Care: Ethelda Chick, MD Primary Cardiologist: Azucena Cecil, MD  HPI: Betty Luna is a 72 y.o. female with nonischemic cardiomyopathy, hypertension, hyperlipidemia presenting today to establish care.  Based on chart review it appears that Betty Luna's cardiac history dates back to February 2023 when she presented with symptoms of chest pain; during that time she lost her daughter, stepdaughter and husband was diagnosed with Alzheimer's.  She had an echocardiogram in March 2023 that demonstrated EF of 35 to 40% and follow-up left heart cath with mild nonobstructive CAD.  She was started on GDMT and since that time repeat imaging has demonstrated persistently worsening LV function with cardiac MRI in April 2024 with LVEF of 25% and otherwise preserved RV function.  Interval hx:  From a functional standpoint, Betty Luna is fairly limited by low back pain and arthritis/pain in her knees. Aside from this, she does not feel very short of breath; she lays flat on her back at night with no PND or orthopnea. She has sharp short acting episodes of chest pain infrequently. Performs all ADLs independently.   Activity level/exercise tolerance:  NYHA III, limited by low back pain and arthritis of her knees.  Orthopnea:  Sleeps on no pillows Paroxysmal noctural dyspnea:  NO Chest pain/pressure:  no Orthostatic lightheadedness:  no Palpitations:  no Lower extremity edema:  no Presyncope/syncope:  no Cough:  no  Past Medical History:  Diagnosis Date   Anemia    Arthritis    CHF (congestive heart failure) (HCC)    Depression    Diabetes mellitus without complication (HCC)    GERD (gastroesophageal reflux disease)    Gout    Hypertension     Current Outpatient Medications  Medication Sig Dispense Refill   acetaminophen (TYLENOL) 500 MG tablet Take 500 mg by mouth every 6 (six) hours as needed for  moderate pain.     allopurinol (ZYLOPRIM) 100 MG tablet Take 200 mg by mouth daily.     aspirin EC 81 MG tablet Take 81 mg by mouth daily.     colchicine 0.6 MG tablet Take 1 tablet (0.6 mg total) by mouth 2 (two) times daily. 30 tablet 0   dapagliflozin propanediol (FARXIGA) 10 MG TABS tablet Take by mouth daily.     diclofenac Sodium (VOLTAREN) 1 % GEL      escitalopram (LEXAPRO) 20 MG tablet Take 20 mg by mouth daily.     ferrous gluconate (FERGON) 324 MG tablet Take 1 tablet (324 mg total) by mouth daily with breakfast. 30 tablet 5   hydrOXYzine (ATARAX) 25 MG tablet Take 25 mg by mouth 2 (two) times daily as needed for anxiety.     LANTUS SOLOSTAR 100 UNIT/ML Solostar Pen Inject 38 Units into the skin daily.     lidocaine (LIDODERM) 5 % Place 1 patch onto the skin every 12 (twelve) hours. Remove & Discard patch within 12 hours or as directed by MD 10 patch 0   metFORMIN (GLUCOPHAGE-XR) 500 MG 24 hr tablet Take 1,000 mg by mouth 2 (two) times daily.     metoCLOPramide (REGLAN) 10 MG tablet Take 1 tablet (10 mg total) by mouth every 8 (eight) hours as needed for nausea or vomiting. 90 tablet 0   oxyCODONE-acetaminophen (PERCOCET) 5-325 MG tablet Take 1 tablet by mouth every 4 (four) hours as needed for severe pain. 15 tablet 0   pantoprazole (PROTONIX) 40 MG tablet Take 40 mg  by mouth daily.  5   predniSONE (DELTASONE) 5 MG tablet Take 5 mg by mouth daily with breakfast.     pregabalin (LYRICA) 100 MG capsule Take 100 mg by mouth 3 (three) times daily.     sacubitril-valsartan (ENTRESTO) 49-51 MG Take 1 tablet by mouth 2 (two) times daily. 60 tablet 6   spironolactone (ALDACTONE) 25 MG tablet Take 1 tablet (25 mg total) by mouth daily. STOP taking your Amlodipine 90 tablet 1   torsemide (DEMADEX) 20 MG tablet Take 20 mg by mouth daily.     Bempedoic Acid 180 MG TABS Take 180 mg by mouth daily. (Patient not taking: Reported on 02/19/2023) 30 tablet 5   carvedilol (COREG) 12.5 MG tablet Take 1  tablet (12.5 mg total) by mouth 2 (two) times daily. (Patient not taking: Reported on 01/31/2023) 60 tablet 5   celecoxib (CELEBREX) 200 MG capsule  (Patient not taking: Reported on 01/31/2023)     cyanocobalamin (VITAMIN B12) 1000 MCG tablet Take 2 tablets by mouth daily. (Patient not taking: Reported on 02/19/2023)     Dulaglutide (TRULICITY) 0.75 MG/0.5ML SOPN  (Patient not taking: Reported on 02/19/2023)     ezetimibe (ZETIA) 10 MG tablet Take 1 tablet (10 mg total) by mouth daily. (Patient not taking: Reported on 01/31/2023) 90 tablet 3   meloxicam (MOBIC) 7.5 MG tablet Take 1 tablet every day by oral route. (Patient not taking: Reported on 01/31/2023)     pravastatin (PRAVACHOL) 40 MG tablet  (Patient not taking: Reported on 01/31/2023)     No current facility-administered medications for this visit.    Allergies  Allergen Reactions   Omeprazole Other (See Comments)    CHEST PAIN    Tramadol Nausea And Vomiting   Atorvastatin Other (See Comments)    And another statin too, muscle pain   Codeine Anxiety and Other (See Comments)      Social History   Socioeconomic History   Marital status: Married    Spouse name: Not on file   Number of children: Not on file   Years of education: Not on file   Highest education level: Not on file  Occupational History   Not on file  Tobacco Use   Smoking status: Former    Packs/day: .5    Types: Cigarettes    Quit date: 2006    Years since quitting: 18.3   Smokeless tobacco: Current    Types: Snuff  Vaping Use   Vaping Use: Never used  Substance and Sexual Activity   Alcohol use: Yes    Alcohol/week: 12.0 standard drinks of alcohol    Types: 12 Cans of beer per week    Comment: occasonally   Drug use: No   Sexual activity: Not Currently  Other Topics Concern   Not on file  Social History Narrative   Not on file   Social Determinants of Health   Financial Resource Strain: Not on file  Food Insecurity: No Food Insecurity  (12/14/2022)   Hunger Vital Sign    Worried About Running Out of Food in the Last Year: Never true    Ran Out of Food in the Last Year: Never true  Transportation Needs: No Transportation Needs (12/14/2022)   PRAPARE - Administrator, Civil Service (Medical): No    Lack of Transportation (Non-Medical): No  Physical Activity: Not on file  Stress: Not on file  Social Connections: Not on file  Intimate Partner Violence: Not At Risk (12/14/2022)  Humiliation, Afraid, Rape, and Kick questionnaire    Fear of Current or Ex-Partner: No    Emotionally Abused: No    Physically Abused: No    Sexually Abused: No      Family History  Problem Relation Age of Onset   Breast cancer Sister     PHYSICAL EXAM: Vitals:   02/19/23 1023  BP: 132/66  Pulse: 84  Resp: 14  SpO2: 98%   GENERAL: NAD, in wheelchair. HEENT: Negative for arcus senilis or xanthelasma. There is no scleral icterus.  The mucous membranes are pink and moist.   NECK: Supple, No masses. Normal carotid upstrokes without bruits. No masses or thyromegaly.    CHEST: There are no chest wall deformities. There is no chest wall tenderness. Respirations are unlabored.  Lungs- CTA B/L CARDIAC:  JVP: 7 cm H2O         Normal S1, S2  Normal rate with regular rhythm. No murmurs, rubs or gallops.  Pulses are 2+ and symmetrical in upper and lower extremities. No edema.  ABDOMEN: Soft, non-tender, non-distended. There are no masses or hepatomegaly. There are normal bowel sounds.  EXTREMITIES: Warm and well perfused with no cyanosis, clubbing.  LYMPHATIC: No axillary or supraclavicular lymphadenopathy.  NEUROLOGIC: Patient is oriented x3 with no focal or lateralizing neurologic deficits.  PSYCH: Patients affect is appropriate, there is no evidence of anxiety or depression.  SKIN: Warm and dry; no lesions or wounds.   DATA REVIEW  ECG: 10/09/22: NSR w/ LBBB (QRSd )  as per my personal interpretation 9/22: Normal sinus  rhythm with incomplete left bundle branch block as per my read  ECHO: 06/01/22: LVEF 30-35%, normal RV function  as per my personal interpretation  CATH: 01/01/22: 1.  Mild nonobstructive coronary artery disease. 2.  Left ventricular angiography was not performed.  EF was moderately reduced by echo.  Moderately elevated left ventricular end-diastolic pressure. 3.  Right heart catheterization showed moderately elevated right and left-sided filling pressures, moderate pulmonary hypertension and normal cardiac output.  Pulmonary hypertension seems to be mostly venous.  CMR: 4/24, personally reviewed.  1.  Severely reduced LV systolic function, LVEF 26%.  2.  No evidence for LGE or scar.  3. No evidence for infiltrative disease or amyloid (normal ECV, relatively normal native T1).  4.  Normal RV size and function.  5.  No significant valvular abnormalities.  6.  Findings consistent with non-ischemic cardiomyopathy.  ASSESSMENT & PLAN:  Heart failure with reduced ejection fraction Etiology of HF: Nonischemic cardiomyopathy as demonstrated by coronary angiography above.  Cardiac MRI from April 2024 without significant LGE or evidence of infiltrative disease.  EKGs from as far back as 2022 demonstrate an incomplete left bundle branch block; QRS duration is not significantly lengthened at that time. Reports that her father may have had CHF and sister; otherwise no FH.  NYHA class / AHA Stage:III, limited by pain in her back/knees; difficult to assess her functional status.  Volume status & Diuretics: torsemide 20mg  daily; euvolemic on exam.  Vasodilators:She has been off Entresto for several weeks now; will restart at 49/51mg  BID Beta-Blocker:coreg 12.5mg  BID ZOX:[id number] 25mg  daily. Repeat labs today.  Cardiometabolic:farxiga 10mg   Devices therapies & Valvulopathies:Will refer to EP for evaluation for CRT/CPP based on 2023 updated guidelines. Repeat EKG today.  Advanced therapies:Not  indicated.   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 Rickard Patience.   3. Hyperlipidemia - Followed by Dr. Myriam Forehand - Most recent lipid panel in 3/23  w/ LDL of 126.  - Will repeat today.   4. HTN - See above, repeat labs today.   5. T2DM - A1C 11.5 in 4/23; followed by her PCP.   6. Obesity -Body mass index is 36.49 kg/m. -discussed importance of weight loss  Daishaun Ayre Advanced Heart Failure Mechanical Circulatory Support

## 2023-02-19 NOTE — Patient Instructions (Signed)
Routine lab work today. Will notify you of abnormal results  INCREASE Entresto to 49/51mg  twice daily  You have been referred to EP  (They will contact you for an appointment)  Follow up in 1 month  Do the following things EVERYDAY: Weigh yourself in the morning before breakfast. Write it down and keep it in a log. Take your medicines as prescribed Eat low salt foods--Limit salt (sodium) to 2000 mg per day.  Stay as active as you can everyday Limit all fluids for the day to less than 2 liters

## 2023-02-20 ENCOUNTER — Ambulatory Visit: Admission: RE | Admit: 2023-02-20 | Payer: 59 | Source: Ambulatory Visit

## 2023-02-21 ENCOUNTER — Inpatient Hospital Stay: Payer: 59 | Attending: Oncology

## 2023-02-25 ENCOUNTER — Ambulatory Visit: Payer: 59 | Admitting: Cardiology

## 2023-03-06 ENCOUNTER — Encounter: Payer: Self-pay | Admitting: Oncology

## 2023-03-15 ENCOUNTER — Other Ambulatory Visit
Admission: RE | Admit: 2023-03-15 | Discharge: 2023-03-15 | Disposition: A | Discharge status: Home / Self Care | Payer: 59 | Source: Ambulatory Visit | Attending: Cardiology | Admitting: Cardiology

## 2023-03-15 ENCOUNTER — Ambulatory Visit (HOSPITAL_BASED_OUTPATIENT_CLINIC_OR_DEPARTMENT_OTHER): Payer: 59 | Admitting: Cardiology

## 2023-03-15 VITALS — BP 133/69 | HR 94 | Ht 66.0 in | Wt 206.0 lb

## 2023-03-15 DIAGNOSIS — I10 Essential (primary) hypertension: Secondary | ICD-10-CM | POA: Diagnosis not present

## 2023-03-15 DIAGNOSIS — I5020 Unspecified systolic (congestive) heart failure: Secondary | ICD-10-CM | POA: Diagnosis present

## 2023-03-15 DIAGNOSIS — I5022 Chronic systolic (congestive) heart failure: Secondary | ICD-10-CM | POA: Diagnosis not present

## 2023-03-15 DIAGNOSIS — N1831 Chronic kidney disease, stage 3a: Secondary | ICD-10-CM

## 2023-03-15 LAB — BASIC METABOLIC PANEL
Anion gap: 8 (ref 5–15)
BUN: 24 mg/dL — ABNORMAL HIGH (ref 8–23)
CO2: 26 mmol/L (ref 22–32)
Calcium: 9.7 mg/dL (ref 8.9–10.3)
Chloride: 105 mmol/L (ref 98–111)
Creatinine, Ser: 1.1 mg/dL — ABNORMAL HIGH (ref 0.44–1.00)
GFR, Estimated: 54 mL/min — ABNORMAL LOW (ref >60)
Glucose, Bld: 124 mg/dL — ABNORMAL HIGH (ref 70–99)
Potassium: 3.8 mmol/L (ref 3.5–5.1)
Sodium: 139 mmol/L (ref 135–145)

## 2023-03-15 LAB — BRAIN NATRIURETIC PEPTIDE: B Natriuretic Peptide: 46.3 pg/mL (ref 0.0–100.0)

## 2023-03-15 MED ORDER — CARVEDILOL 25 MG PO TABS
25.0000 mg | ORAL_TABLET | Freq: Two times a day (BID) | ORAL | 5 refills | Status: DC
Start: 2023-03-15 — End: 2023-08-14

## 2023-03-15 NOTE — Progress Notes (Signed)
ADVANCED HEART FAILURE CLINIC NOTE  Referring Physician: Ethelda Chick, MD  Primary Care: Ethelda Chick, MD Primary Cardiologist: Azucena Cecil, MD  HPI: Betty Luna is a 72 y.o. female with nonischemic cardiomyopathy, hypertension, hyperlipidemia presenting today for follow up.  Based on chart review it appears that Betty Luna's cardiac history dates back to February 2023 when she presented with symptoms of chest pain; during that time she lost her daughter, stepdaughter and husband was diagnosed with Alzheimer's.  She had an echocardiogram in March 2023 that demonstrated EF of 35 to 40% and follow-up left heart cath with mild nonobstructive CAD.  She was started on GDMT and since that time repeat imaging has demonstrated persistently worsening LV function with cardiac MRI in April 2024 with LVEF of 25% and otherwise preserved RV function.  Interval hx:  Since her last appointment, she no longer has shortness of breath, PND, orthopnea or lightheadedness. 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 She can only walk up to 30 ft before needing to stop due to knee/hip pain.   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  Orthopnea:  Sleeps on no pillows Paroxysmal noctural dyspnea:  NO Chest pain/pressure:  no Orthostatic lightheadedness:  no Palpitations:  no Lower extremity edema:  no Presyncope/syncope:  no Cough:  no  Past Medical History:  Diagnosis Date   Anemia    Arthritis    CHF (congestive heart failure) (HCC)    Depression    Diabetes mellitus without complication (HCC)    GERD (gastroesophageal reflux disease)    Gout    Hypertension     Current Outpatient Medications  Medication Sig Dispense Refill   acetaminophen (TYLENOL) 500 MG tablet Take 500 mg by mouth every 6 (six) hours as needed for moderate pain.     allopurinol (ZYLOPRIM) 100 MG tablet Take 200 mg by mouth  daily.     aspirin EC 81 MG tablet Take 81 mg by mouth daily.     colchicine 0.6 MG tablet Take 1 tablet (0.6 mg total) by mouth 2 (two) times daily. 30 tablet 0   dapagliflozin propanediol (FARXIGA) 10 MG TABS tablet Take by mouth daily.     diclofenac Sodium (VOLTAREN) 1 % GEL      escitalopram (LEXAPRO) 20 MG tablet Take 20 mg by mouth daily.     ferrous gluconate (FERGON) 324 MG tablet Take 1 tablet (324 mg total) by mouth daily with breakfast. 30 tablet 5   hydrOXYzine (ATARAX) 25 MG tablet Take 25 mg by mouth 2 (two) times daily as needed for anxiety.     LANTUS SOLOSTAR 100 UNIT/ML Solostar Pen Inject 38 Units into the skin daily.     lidocaine (LIDODERM) 5 % Place 1 patch onto the skin every 12 (twelve) hours. Remove & Discard patch within 12 hours or as directed by MD 10 patch 0   metFORMIN (GLUCOPHAGE-XR) 500 MG 24 hr tablet Take 1,000 mg by mouth 2 (two) times daily.     pantoprazole (PROTONIX) 40 MG tablet Take 40 mg by mouth daily.  5   pregabalin (LYRICA) 100 MG capsule Take 100 mg by mouth 3 (three) times daily.     sacubitril-valsartan (ENTRESTO) 49-51 MG Take 1 tablet by mouth 2 (two) times daily. (Patient taking differently: Take 1 tablet by mouth 2 (two) times daily. Patient is taking 97-103 MG - 1 tab BID; pt reports only taking once  daily because she forgot to take it BID for about 2 wks) 60 tablet 6   spironolactone (ALDACTONE) 25 MG tablet Take 1 tablet (25 mg total) by mouth daily. STOP taking your Amlodipine 90 tablet 1   torsemide (DEMADEX) 20 MG tablet Take 20 mg by mouth daily.     Bempedoic Acid 180 MG TABS Take 180 mg by mouth daily. (Patient not taking: Reported on 02/19/2023) 30 tablet 5   carvedilol (COREG) 12.5 MG tablet Take 1 tablet (12.5 mg total) by mouth 2 (two) times daily. (Patient not taking: Reported on 01/31/2023) 60 tablet 5   celecoxib (CELEBREX) 200 MG capsule  (Patient not taking: Reported on 03/15/2023)     cyanocobalamin (VITAMIN B12) 1000 MCG tablet  Take 2 tablets by mouth daily. (Patient not taking: Reported on 02/19/2023)     Dulaglutide (TRULICITY) 0.75 MG/0.5ML SOPN  (Patient not taking: Reported on 02/19/2023)     ezetimibe (ZETIA) 10 MG tablet Take 1 tablet (10 mg total) by mouth daily. (Patient not taking: Reported on 01/31/2023) 90 tablet 3   meloxicam (MOBIC) 7.5 MG tablet Take 1 tablet every day by oral route. (Patient not taking: Reported on 01/31/2023)     metoCLOPramide (REGLAN) 10 MG tablet Take 1 tablet (10 mg total) by mouth every 8 (eight) hours as needed for nausea or vomiting. (Patient not taking: Reported on 03/15/2023) 90 tablet 0   oxyCODONE-acetaminophen (PERCOCET) 5-325 MG tablet Take 1 tablet by mouth every 4 (four) hours as needed for severe pain. (Patient not taking: Reported on 03/15/2023) 15 tablet 0   pravastatin (PRAVACHOL) 40 MG tablet  (Patient not taking: Reported on 01/31/2023)     predniSONE (DELTASONE) 5 MG tablet Take 5 mg by mouth daily with breakfast. (Patient not taking: Reported on 03/15/2023)     No current facility-administered medications for this visit.    Allergies  Allergen Reactions   Omeprazole Other (See Comments)    CHEST PAIN    Tramadol Nausea And Vomiting   Atorvastatin Other (See Comments)    And another statin too, muscle pain   Codeine Anxiety and Other (See Comments)      Social History   Socioeconomic History   Marital status: Married    Spouse name: Not on file   Number of children: Not on file   Years of education: Not on file   Highest education level: Not on file  Occupational History   Not on file  Tobacco Use   Smoking status: Former    Packs/day: .5    Types: Cigarettes    Quit date: 2006    Years since quitting: 18.4   Smokeless tobacco: Current    Types: Snuff  Vaping Use   Vaping Use: Never used  Substance and Sexual Activity   Alcohol use: Yes    Alcohol/week: 12.0 standard drinks of alcohol    Types: 12 Cans of beer per week    Comment: occasonally    Drug use: No   Sexual activity: Not Currently  Other Topics Concern   Not on file  Social History Narrative   Not on file   Social Determinants of Health   Financial Resource Strain: Not on file  Food Insecurity: No Food Insecurity (12/14/2022)   Hunger Vital Sign    Worried About Running Out of Food in the Last Year: Never true    Ran Out of Food in the Last Year: Never true  Transportation Needs: No Transportation Needs (12/14/2022)   PRAPARE -  Administrator, Civil Service (Medical): No    Lack of Transportation (Non-Medical): No  Physical Activity: Not on file  Stress: Not on file  Social Connections: Not on file  Intimate Partner Violence: Not At Risk (12/14/2022)   Humiliation, Afraid, Rape, and Kick questionnaire    Fear of Current or Ex-Partner: No    Emotionally Abused: No    Physically Abused: No    Sexually Abused: No      Family History  Problem Relation Age of Onset   Breast cancer Sister     PHYSICAL EXAM: Vitals:   03/15/23 1034  BP: 133/69  Pulse: 94  SpO2: 98%   GENERAL: Elderly AAF  with walker HEENT: Negative for arcus senilis or xanthelasma. There is no scleral icterus.  The mucous membranes are pink and moist.   NECK: Supple, No masses. Normal carotid upstrokes without bruits. No masses or thyromegaly.    CHEST: There are no chest wall deformities. There is no chest wall tenderness. Respirations are unlabored.  Lungs- CTA B/L CARDIAC:  JVP: 7 cm          Normal rate with regular rhythm. No murmurs, rubs or gallops.  Pulses are 2+ and symmetrical in upper and lower extremities. No edema.  ABDOMEN: Soft, non-tender, non-distended. There are no masses or hepatomegaly. There are normal bowel sounds.  EXTREMITIES: Warm and well perfused with no cyanosis, clubbing.  LYMPHATIC: No axillary or supraclavicular lymphadenopathy.  NEUROLOGIC: Patient is oriented x3 with no focal or lateralizing neurologic deficits.  PSYCH: Patients affect is  appropriate, there is no evidence of anxiety or depression.  SKIN: Warm and dry; no lesions or wounds.    DATA REVIEW  ECG: 10/09/22: NSR w/ LBBB (QRSd )  as per my personal interpretation 9/22: Normal sinus rhythm with incomplete left bundle branch block as per my read  ECHO: 06/01/22: LVEF 30-35%, normal RV function  as per my personal interpretation  CATH: 01/01/22: 1.  Mild nonobstructive coronary artery disease. 2.  Left ventricular angiography was not performed.  EF was moderately reduced by echo.  Moderately elevated left ventricular end-diastolic pressure. 3.  Right heart catheterization showed moderately elevated right and left-sided filling pressures, moderate pulmonary hypertension and normal cardiac output.  Pulmonary hypertension seems to be mostly venous.  CMR: 4/24, personally reviewed.  1.  Severely reduced LV systolic function, LVEF 26%.  2.  No evidence for LGE or scar.  3. No evidence for infiltrative disease or amyloid (normal ECV, relatively normal native T1).  4.  Normal RV size and function.  5.  No significant valvular abnormalities.  6.  Findings consistent with non-ischemic cardiomyopathy.  ASSESSMENT & PLAN:  Heart failure with reduced ejection fraction Etiology of HF: Nonischemic cardiomyopathy as demonstrated by coronary angiography above.  Cardiac MRI from April 2024 without significant LGE or evidence of infiltrative disease.  EKGs from as far back as 2022 demonstrate an incomplete left bundle branch block; QRS duration is not significantly lengthened at that time. Reports that her father may have had CHF and sister; otherwise no FH.  NYHA class / AHA Stage:III, limited by pain in her back/knees; difficult to assess her functional status.  Volume status & Diuretics: torsemide 20mg  daily; euvolemic on exam.  Vasodilators:Entresto 97/103mg  BID, repeat labs today.  Beta-Blocker: HR in the 90s today; increase coreg to 25mg  BID NWG:[id number] 25mg   daily. Repeat labs today.  Cardiometabolic:farxiga 10mg   Devices therapies & Valvulopathies:Will refer to EP for evaluation for CRT/CPP;  repeat EKG today. Repeat TTE prior to EP appt.  Advanced therapies:Not indicated.   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 Rickard Patience.   3. Hyperlipidemia - Followed by Dr. Myriam Forehand - Most recent lipid panel in 3/23 w/ LDL of 126.  - Repeat lipid panel today  4. HTN - See above, repeat labs today.   5. T2DM - A1C 11.5 in 4/23; followed by her PCP.  - repeat today  6. Obesity -Body mass index is 33.25 kg/m. -discussed importance of weight loss  7. Arthritis - Refer to PT  Fontella Shan Advanced Heart Failure Mechanical Circulatory Support

## 2023-03-15 NOTE — Patient Instructions (Signed)
INCREASE Carvedilol to 25mg twice daily  Routine lab work today. Will notify you of abnormal results  Follow up in 3 months  

## 2023-03-22 ENCOUNTER — Other Ambulatory Visit: Payer: Self-pay

## 2023-03-25 ENCOUNTER — Inpatient Hospital Stay: Payer: 59 | Attending: Oncology

## 2023-03-25 DIAGNOSIS — E538 Deficiency of other specified B group vitamins: Secondary | ICD-10-CM | POA: Insufficient documentation

## 2023-03-29 ENCOUNTER — Inpatient Hospital Stay: Payer: 59

## 2023-03-29 DIAGNOSIS — E538 Deficiency of other specified B group vitamins: Secondary | ICD-10-CM | POA: Diagnosis not present

## 2023-03-29 MED ORDER — CYANOCOBALAMIN 1000 MCG/ML IJ SOLN
1000.0000 ug | Freq: Once | INTRAMUSCULAR | Status: AC
  Administered 2023-03-29: 1000 ug via INTRAMUSCULAR
  Filled 2023-03-29: qty 1

## 2023-04-11 ENCOUNTER — Encounter: Payer: Self-pay | Admitting: Cardiology

## 2023-04-11 ENCOUNTER — Ambulatory Visit: Payer: 59 | Attending: Cardiology | Admitting: Cardiology

## 2023-04-17 ENCOUNTER — Institutional Professional Consult (permissible substitution): Payer: 59 | Admitting: Cardiology

## 2023-04-24 ENCOUNTER — Inpatient Hospital Stay: Payer: 59 | Attending: Oncology

## 2023-05-27 ENCOUNTER — Inpatient Hospital Stay: Payer: 59 | Attending: Oncology

## 2023-05-29 ENCOUNTER — Ambulatory Visit: Payer: 59 | Admitting: Cardiology

## 2023-06-13 ENCOUNTER — Encounter: Payer: Self-pay | Admitting: Oncology

## 2023-06-14 ENCOUNTER — Encounter: Payer: Self-pay | Admitting: Cardiology

## 2023-06-14 ENCOUNTER — Ambulatory Visit: Payer: 59 | Attending: Cardiology | Admitting: Cardiology

## 2023-06-14 VITALS — BP 132/72 | Ht 62.0 in | Wt 214.0 lb

## 2023-06-14 DIAGNOSIS — I428 Other cardiomyopathies: Secondary | ICD-10-CM | POA: Diagnosis not present

## 2023-06-14 DIAGNOSIS — I5022 Chronic systolic (congestive) heart failure: Secondary | ICD-10-CM | POA: Diagnosis not present

## 2023-06-14 DIAGNOSIS — I10 Essential (primary) hypertension: Secondary | ICD-10-CM | POA: Diagnosis not present

## 2023-06-14 NOTE — Progress Notes (Signed)
Electrophysiology Office Note:    Date:  06/14/2023   ID:  Betty Luna, DOB 1951-08-10, MRN [id number]  [facility] Cardiologist:  Debbe Odea, MD  [facility] Electrophysiologist:  Lanier Prude, MD   Referring MD: Dorthula Nettles, DO   Chief Complaint: Chronic systolic heart failure  History of Present Illness:    Betty Luna is a 72 y.o. femalewho I am seeing today for an evaluation of chronic systolic heart failure at the request of Dr. Gasper Lloyd.  The patient was last seen by Dr. Gasper Lloyd on Mar 15, 2023.  The patient has a medical history that includes nonischemic cardiomyopathy, hypertension, hyperlipidemia.  Her cardiomyopathy was diagnosed in February 2023.  Left heart cath showed nonobstructive coronary artery disease.  Cardiac MRI in April 2024 showed an ejection fraction of 25% with normal RV function.  She is very limited due to bilateral knee pain.  She tells me that she feels short of breath activity.  She is limited by back pain.  No syncope or presyncope.  She does take her medications.  She does endorse heavy alcohol use on the weekend.      Their past medical, social and family history was reveiwed.   ROS:   Please see the history of present illness.    All other systems reviewed and are negative.  EKGs/Labs/Other Studies Reviewed:    The following studies were reviewed today:  February 06, 2023 cardiac MRI EF 26% Septal dyskinesis suggestive of bundle branch block No LGE  June 01, 2022 echo EF 30 to 35% RV normal Mildly dilated left atrium Mild MR  Mar 15, 2023 EKG shows sinus rhythm.  QRS duration 120 to 130 ms.       Physical Exam:    VS:  Ht 5\' 2"  (1.575 m)   Wt 214 lb (97.1 kg)   SpO2 97%   BMI 39.14 kg/m     Wt Readings from Last 3 Encounters:  06/14/23 214 lb (97.1 kg)  03/15/23 206 lb (93.4 kg)  02/19/23 206 lb (93.4 kg)     GEN:  Well nourished, well developed in no acute distress.   Obese CARDIAC: RRR, no murmurs, rubs, gallops RESPIRATORY:  Clear to auscultation without rales, wheezing or rhonchi       ASSESSMENT AND PLAN:    1. Chronic systolic CHF (congestive heart failure) (HCC)   2. Morbid obesity (HCC)   3. Primary hypertension   4. NICM (nonischemic cardiomyopathy) (HCC)     #Chronic systolic heart failure #Left bundle branch block #Nonischemic cardiomyopathy The patient has NYHA class III symptoms.  Follows with Dr. Gasper Lloyd in the heart failure clinic.  On good medical therapy with Coreg, Clifton Custard, spironolactone and torsemide. Her EKG shows a left bundle branch block with a QRS duration between 120 and 130 ms.  It has widened with time and that corresponds to a reduced left ventricular function.  There is some dyssynchrony on imaging (echo and MRI).   She clearly has an indication for ICD given a persistently reduced ejection fraction in the setting of nonischemic cardiomyopathy on guideline directed medical therapy.  Given her left bundle branch block morphology EKG and dyssynchrony, I think it would be reasonable to implant a CS lead at the time of ICD implant.  I have discussed the likelihood of improving her ejection fraction and how it is not as high as patients who have a much wider QRS and more dyssynchrony on echo.  She understands this and wishes to  proceed.  --------  The patient has a non ischemic CM (EF 26%), NYHA Class III CHF, and CAD.  He is referred by Dr. Gasper Lloyd for risk stratification of sudden death and consideration of ICD implantation.  At this time, she meets criteria for ICD implantation for primary prevention of sudden death.  I have had a thorough discussion with the patient reviewing options.  The patient and their family (if available) have had opportunities to ask questions and have them answered. The patient and I have decided together through a shared decision making process to proceed with ICD implant at this time.     Risks, benefits, alternatives to ICD implantation were discussed in detail with the patient today. The patient understands that the risks include but are not limited to bleeding, infection, pneumothorax, perforation, tamponade, vascular damage, renal failure, MI, stroke, death, inappropriate shocks, and lead dislodgement and wishes to proceed.  We will therefore schedule device implantation at the next available time.  #Alcohol use The patient does consume a large amount of alcohol.  I suspect this is contributing to her cardiomyopathy.  I have encouraged her to cut down or stop.       Signed, Rossie Muskrat. Lalla Brothers, MD, [facility], [facility] 06/14/2023 12:11 PM    Electrophysiology [facility]

## 2023-06-14 NOTE — Patient Instructions (Addendum)
Medication Instructions:  Your physician recommends that you continue on your current medications as directed. Please refer to the Current Medication list given to you today.  *If you need a refill on your cardiac medications before your next appointment, please call your pharmacy*   Lab Work: BMET and CBC prior to your procedure  Please go to [facility] [address] ([facility] [address], [place] [address] You do not need an appointment.  They are open from 7:30 am-4 pm.  Lunch from 1:00 pm- 2:00 pm You do not need to be fasting.   You may also go to any of these [facility] locations:  [employer]  - [address] [employer] [address] Chief Technology Officer)  Testing/Procedures: Your physician has recommended that you have a defibrillator inserted. An implantable cardioverter defibrillator (ICD) is a small device that is placed in your chest or, in rare cases, your abdomen. This device uses electrical pulses or shocks to help control life-threatening, irregular heartbeats that could lead the heart to suddenly stop beating (sudden cardiac arrest). Leads are attached to the ICD that goes into your heart. This is done in the hospital and usually requires an overnight stay. Please see the instruction sheet given to you today for more information.  Follow-Up: At [facility], you and your health needs are our priority.  As part of our continuing mission to provide you with exceptional heart care, we have created designated Provider Care Teams.  These Care Teams include your primary Cardiologist (physician) and Advanced Practice Providers (APPs -  Physician Assistants and Nurse Practitioners) who all work together to provide you with the care you need, when you need it.   Your next appointment:   You will follow up with Device Clinic 10-14 days after your procedure and then you will see Dr. Lalla Brothers or Sherie Don, NP 91 days after your procedure.

## 2023-06-25 ENCOUNTER — Inpatient Hospital Stay: Payer: 59 | Attending: Oncology

## 2023-07-16 ENCOUNTER — Ambulatory Visit: Payer: 59 | Attending: Cardiology | Admitting: Cardiology

## 2023-07-16 ENCOUNTER — Encounter: Payer: Self-pay | Admitting: Cardiology

## 2023-07-16 VITALS — BP 147/88 | HR 90 | Wt 211.8 lb

## 2023-07-16 DIAGNOSIS — R079 Chest pain, unspecified: Secondary | ICD-10-CM | POA: Insufficient documentation

## 2023-07-16 DIAGNOSIS — I110 Hypertensive heart disease with heart failure: Secondary | ICD-10-CM | POA: Diagnosis not present

## 2023-07-16 DIAGNOSIS — I428 Other cardiomyopathies: Secondary | ICD-10-CM | POA: Diagnosis not present

## 2023-07-16 DIAGNOSIS — M170 Bilateral primary osteoarthritis of knee: Secondary | ICD-10-CM | POA: Insufficient documentation

## 2023-07-16 DIAGNOSIS — E119 Type 2 diabetes mellitus without complications: Secondary | ICD-10-CM | POA: Insufficient documentation

## 2023-07-16 DIAGNOSIS — D563 Thalassemia minor: Secondary | ICD-10-CM | POA: Insufficient documentation

## 2023-07-16 DIAGNOSIS — Z794 Long term (current) use of insulin: Secondary | ICD-10-CM | POA: Diagnosis not present

## 2023-07-16 DIAGNOSIS — M545 Low back pain, unspecified: Secondary | ICD-10-CM | POA: Diagnosis not present

## 2023-07-16 DIAGNOSIS — Z79899 Other long term (current) drug therapy: Secondary | ICD-10-CM | POA: Diagnosis not present

## 2023-07-16 DIAGNOSIS — E669 Obesity, unspecified: Secondary | ICD-10-CM | POA: Diagnosis not present

## 2023-07-16 DIAGNOSIS — E785 Hyperlipidemia, unspecified: Secondary | ICD-10-CM | POA: Insufficient documentation

## 2023-07-16 DIAGNOSIS — D472 Monoclonal gammopathy: Secondary | ICD-10-CM | POA: Insufficient documentation

## 2023-07-16 DIAGNOSIS — Z7984 Long term (current) use of oral hypoglycemic drugs: Secondary | ICD-10-CM | POA: Insufficient documentation

## 2023-07-16 DIAGNOSIS — I5022 Chronic systolic (congestive) heart failure: Secondary | ICD-10-CM | POA: Diagnosis not present

## 2023-07-16 DIAGNOSIS — I2510 Atherosclerotic heart disease of native coronary artery without angina pectoris: Secondary | ICD-10-CM | POA: Insufficient documentation

## 2023-07-16 DIAGNOSIS — Z6838 Body mass index (BMI) 38.0-38.9, adult: Secondary | ICD-10-CM | POA: Insufficient documentation

## 2023-07-16 DIAGNOSIS — I10 Essential (primary) hypertension: Secondary | ICD-10-CM | POA: Diagnosis not present

## 2023-07-16 MED ORDER — TORSEMIDE 20 MG PO TABS: 20.0000 mg | ORAL_TABLET | Freq: Every day | ORAL | 3 refills | Status: DC

## 2023-07-16 MED ORDER — ENTRESTO 24-26 MG PO TABS: 1.0000 | ORAL_TABLET | Freq: Two times a day (BID) | ORAL | 6 refills | Status: DC

## 2023-07-16 NOTE — Progress Notes (Signed)
ADVANCED HEART FAILURE CLINIC NOTE  Referring Physician: Ethelda Chick, MD  Primary Care: Ethelda Chick, MD Primary Cardiologist: Azucena Cecil, MD  HPI: Betty Luna is a 72 y.o. female with nonischemic cardiomyopathy, hypertension, hyperlipidemia presenting today for follow up.  Based on chart review it appears that Ms. Betty Luna's cardiac history dates back to February 2023 when she presented with symptoms of chest pain; during that time she lost her daughter, stepdaughter and husband was diagnosed with Alzheimer's.  She had an echocardiogram in March 2023 that demonstrated EF of 35 to 40% and follow-up left heart cath with mild nonobstructive CAD.  She was started on GDMT and since that time repeat imaging has demonstrated persistently worsening LV function with cardiac MRI in April 2024 with LVEF of 25% and otherwise preserved RV function.  Interval hx:  -She has now seen EP with plans to undergo BiV device placement on August 06, 2023. -Sherryll Burger was discontinued 2 to 3 months ago at an urgent care center due to lightheadedness. -She was feeling fairly well from a heart failure standpoint however reports having episodes of chest pain over the past 1 week.  The pain is not related to rest or exertion.  It appears to be worse when she is laying flat.  No diaphoresis, nausea or other symptoms consistent with angina.  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  Orthopnea:  Sleeps on no pillows Paroxysmal noctural dyspnea:  NO Chest pain/pressure: Yes Orthostatic lightheadedness:  no Palpitations:  no Lower extremity edema:  no Presyncope/syncope:  no Cough:  no  Past Medical History:  Diagnosis Date   Anemia    Arthritis    CHF (congestive heart failure) (HCC)    Depression    Diabetes mellitus without complication (HCC)    GERD (gastroesophageal reflux disease)    Gout    Hypertension     Current Outpatient  Medications  Medication Sig Dispense Refill   acetaminophen (TYLENOL) 500 MG tablet Take 500 mg by mouth every 6 (six) hours as needed for moderate pain.     allopurinol (ZYLOPRIM) 100 MG tablet Take 200 mg by mouth daily.     aspirin EC 81 MG tablet Take 81 mg by mouth daily.     Bempedoic Acid 180 MG TABS Take 180 mg by mouth daily. 30 tablet 5   carvedilol (COREG) 25 MG tablet Take 1 tablet (25 mg total) by mouth 2 (two) times daily. 60 tablet 5   colchicine 0.6 MG tablet Take 1 tablet (0.6 mg total) by mouth 2 (two) times daily. 30 tablet 0   dapagliflozin propanediol (FARXIGA) 10 MG TABS tablet Take by mouth daily.     escitalopram (LEXAPRO) 20 MG tablet Take 20 mg by mouth daily.     meloxicam (MOBIC) 7.5 MG tablet      metFORMIN (GLUCOPHAGE-XR) 500 MG 24 hr tablet Take 1,000 mg by mouth 2 (two) times daily.     pantoprazole (PROTONIX) 40 MG tablet Take 40 mg by mouth daily.  5   sacubitril-valsartan (ENTRESTO) 49-51 MG Take 1 tablet by mouth 2 (two) times daily. 60 tablet 6   spironolactone (ALDACTONE) 25 MG tablet Take 1 tablet (25 mg total) by mouth daily. STOP taking your Amlodipine 90 tablet 1   torsemide (DEMADEX) 20 MG tablet Take 20 mg by mouth daily.     celecoxib (CELEBREX) 200 MG capsule  (Patient not taking: Reported on 03/15/2023)     diclofenac  Sodium (VOLTAREN) 1 % GEL      Dulaglutide (TRULICITY) 0.75 MG/0.5ML SOPN  (Patient not taking: Reported on 02/19/2023)     ezetimibe (ZETIA) 10 MG tablet Take 1 tablet (10 mg total) by mouth daily. (Patient not taking: Reported on 01/31/2023) 90 tablet 3   LANTUS SOLOSTAR 100 UNIT/ML Solostar Pen Inject 38 Units into the skin daily. (Patient not taking: Reported on 07/16/2023)     oxyCODONE-acetaminophen (PERCOCET) 5-325 MG tablet Take 1 tablet by mouth every 4 (four) hours as needed for severe pain. (Patient not taking: Reported on 03/15/2023) 15 tablet 0   predniSONE (DELTASONE) 5 MG tablet Take 5 mg by mouth daily with breakfast.  (Patient not taking: Reported on 03/15/2023)     No current facility-administered medications for this visit.    Allergies  Allergen Reactions   Omeprazole Other (See Comments)    CHEST PAIN    Tramadol Nausea And Vomiting   Atorvastatin Other (See Comments)    And another statin too, muscle pain   Codeine Anxiety and Other (See Comments)      Social History   Socioeconomic History   Marital status: Married    Spouse name: Not on file   Number of children: Not on file   Years of education: Not on file   Highest education level: Not on file  Occupational History   Not on file  Tobacco Use   Smoking status: Former    Current packs/day: 0.00    Types: Cigarettes    Quit date: 2006    Years since quitting: 18.7   Smokeless tobacco: Current    Types: Snuff  Vaping Use   Vaping status: Never Used  Substance and Sexual Activity   Alcohol use: Yes    Alcohol/week: 12.0 standard drinks of alcohol    Types: 12 Cans of beer per week    Comment: occasonally   Drug use: No   Sexual activity: Not Currently  Other Topics Concern   Not on file  Social History Narrative   Not on file   Social Determinants of Health   Financial Resource Strain: Not on file  Food Insecurity: No Food Insecurity (12/14/2022)   Hunger Vital Sign    Worried About Running Out of Food in the Last Year: Never true    Ran Out of Food in the Last Year: Never true  Transportation Needs: No Transportation Needs (12/14/2022)   PRAPARE - Administrator, Civil Service (Medical): No    Lack of Transportation (Non-Medical): No  Physical Activity: Not on file  Stress: Not on file  Social Connections: Not on file  Intimate Partner Violence: Not At Risk (12/14/2022)   Humiliation, Afraid, Rape, and Kick questionnaire    Fear of Current or Ex-Partner: No    Emotionally Abused: No    Physically Abused: No    Sexually Abused: No      Family History  Problem Relation Age of Onset   Breast cancer  Sister     PHYSICAL EXAM: Vitals:   07/16/23 1059  BP: (!) 147/88  Pulse: 90  SpO2: 95%   GENERAL: Well nourished, well developed, and in no apparent distress at rest.  HEENT: Negative for arcus senilis or xanthelasma. There is no scleral icterus.  The mucous membranes are pink and moist.   NECK: Supple, No masses. Normal carotid upstrokes without bruits. No masses or thyromegaly.    CHEST: There are no chest wall deformities. There is no chest wall  tenderness. Respirations are unlabored.  Lungs- CTA B/L CARDIAC:  JVP: 10 cm          Normal rate with regular rhythm. No murmurs, rubs or gallops.  Pulses are 2+ and symmetrical in upper and lower extremities. No edema.  ABDOMEN: Soft, non-tender, non-distended. There are no masses or hepatomegaly. There are normal bowel sounds.  EXTREMITIES: Warm and well perfused with no cyanosis, clubbing.  LYMPHATIC: No axillary or supraclavicular lymphadenopathy.  NEUROLOGIC: Patient is oriented x3 with no focal or lateralizing neurologic deficits.  PSYCH: Patients affect is appropriate, there is no evidence of anxiety or depression.  SKIN: Warm and dry; no lesions or wounds.     DATA REVIEW  ECG: 10/09/22: NSR w/ LBBB (QRSd )  as per my personal interpretation 9/22: Normal sinus rhythm with incomplete left bundle branch block as per my read  ECHO: 06/01/22: LVEF 30-35%, normal RV function  as per my personal interpretation  CATH: 01/01/22: 1.  Mild nonobstructive coronary artery disease. 2.  Left ventricular angiography was not performed.  EF was moderately reduced by echo.  Moderately elevated left ventricular end-diastolic pressure. 3.  Right heart catheterization showed moderately elevated right and left-sided filling pressures, moderate pulmonary hypertension and normal cardiac output.  Pulmonary hypertension seems to be mostly venous.  CMR: 4/24, personally reviewed.  1.  Severely reduced LV systolic function, LVEF 26%.  2.  No  evidence for LGE or scar.  3. No evidence for infiltrative disease or amyloid (normal ECV, relatively normal native T1).  4.  Normal RV size and function.  5.  No significant valvular abnormalities.  6.  Findings consistent with non-ischemic cardiomyopathy.  ASSESSMENT & PLAN:  Heart failure with reduced ejection fraction Etiology of HF: Nonischemic cardiomyopathy as demonstrated by coronary angiography above.  Cardiac MRI from April 2024 without significant LGE or evidence of infiltrative disease.  EKGs from as far back as 2022 demonstrate an incomplete left bundle branch block; QRS duration is not significantly lengthened at that time. Reports that her father may have had CHF and sister; otherwise no FH.  NYHA class / AHA Stage:III, limited by pain in her back/knees; difficult to assess her functional status.  Volume status & Diuretics: torsemide 20mg  daily; mildly hypervolemic on exam. Will take 40mg  today.  Vasodilators: stopped Entresto 2-4 months ago after a visit to urgent care for lightheadedness. Will restart at 24/26mg  BID. If she cannot tolerate this would decrease to 12/13mg  BID or losartan. Plan for 1-2 week follow up with pharmD. I've asked her to bring all meds to this visit.  Beta-Blocker: coreg 25mg  BID; unsure what dose she is taking at home though. Will bring all meds to pharmD visit. UJW:[id number] 25mg  daily. Repeat labs today.  Cardiometabolic:farxiga 10mg   Devices therapies & Valvulopathies: Planning to undergo BiV ICD placement by Dr. Lalla Brothers on 08/06/23 Advanced therapies:Not indicated.   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 Rickard Patience.   3. Hyperlipidemia - Followed by Dr. Myriam Forehand  4. HTN - See above, repeat labs today.   5. T2DM - A1C 11.5 in 4/23; followed by her PCP.   6. Obesity -Body mass index is 38.74 kg/m. -discussed importance of weight loss  7. Chest Pain - Appears noncardiac.  - LHC from 2023 with 20% lesion in  the LAD but otherwise no disease. EKG today unremarkable. Appears mildly hypervolemic on exam and has been of some GDMT. Will restart meds, take extra torsemide today. If symptoms continue I have asked  her to reach out to [place] ASAP. If symptoms persist at home will plan to go to ER.   Shakeyla Giebler Advanced Heart Failure Mechanical Circulatory Support

## 2023-07-16 NOTE — Patient Instructions (Signed)
Medication Changes:  Take Entresto 24/26 mg (1 tablet) take two times a day.  Take Torsemide 20 mg (1 tablet) daily.  Take 40 mg (2 tablets) today ONLY.  Lab Work:  Labs done today, your results will be available in MyChart, we will contact you for abnormal readings.    Special Instructions // Education:  Do the following things EVERYDAY: Weigh yourself in the morning before breakfast. Write it down and keep it in a log. Take your medicines as prescribed Eat low salt foods--Limit salt (sodium) to 2000 mg per day.  Stay as active as you can everyday Limit all fluids for the day to less than 2 liters   Follow-Up in:  1 week with our pharmacist. PLEASE BRING ALL OF YOUR MEDICATIONS to your appointment.  Follow up in 3 months with Dr. Gasper Lloyd.    If you have any questions or concerns before your next appointment please send [place] a message through [facility] or call our office at [phone] Monday-Friday 8 am-5 pm.   If you have an urgent need after hours on the weekend please call your Primary Cardiologist or the Advanced Heart Failure Clinic in [place] at [phone].

## 2023-07-16 NOTE — Progress Notes (Signed)
ADVANCED HEART FAILURE CLINIC NOTE  Referring Physician: Ethelda Chick, MD  Primary Care: Ethelda Chick, MD Primary Cardiologist: Azucena Cecil, MD  HPI: Betty Luna is a 72 y.o. female with nonischemic cardiomyopathy, hypertension, hyperlipidemia presenting today for follow up.  Based on chart review it appears that Betty Luna's cardiac history dates back to February 2023 when she presented with symptoms of chest pain; during that time she lost her daughter, stepdaughter and husband was diagnosed with Alzheimer's.  She had an echocardiogram in March 2023 that demonstrated EF of 35 to 40% and follow-up left heart cath with mild nonobstructive CAD.  She was started on GDMT and since that time repeat imaging has demonstrated persistently worsening LV function with cardiac MRI in April 2024 with LVEF of 25% and otherwise preserved RV function.  Interval hx:  Since her last appointment, she no longer has shortness of breath, PND, orthopnea or lightheadedness. 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 She can only walk up to 30 ft before needing to stop due to knee/hip pain.   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  Orthopnea:  Sleeps on no pillows Paroxysmal noctural dyspnea:  NO Chest pain/pressure:  no Orthostatic lightheadedness:  no Palpitations:  no Lower extremity edema:  no Presyncope/syncope:  no Cough:  no  Past Medical History:  Diagnosis Date   Anemia    Arthritis    CHF (congestive heart failure) (HCC)    Depression    Diabetes mellitus without complication (HCC)    GERD (gastroesophageal reflux disease)    Gout    Hypertension     Current Outpatient Medications  Medication Sig Dispense Refill   acetaminophen (TYLENOL) 500 MG tablet Take 500 mg by mouth every 6 (six) hours as needed for moderate pain.     allopurinol (ZYLOPRIM) 100 MG tablet Take 200 mg by mouth  daily.     aspirin EC 81 MG tablet Take 81 mg by mouth daily.     Bempedoic Acid 180 MG TABS Take 180 mg by mouth daily. 30 tablet 5   carvedilol (COREG) 25 MG tablet Take 1 tablet (25 mg total) by mouth 2 (two) times daily. 60 tablet 5   colchicine 0.6 MG tablet Take 1 tablet (0.6 mg total) by mouth 2 (two) times daily. 30 tablet 0   dapagliflozin propanediol (FARXIGA) 10 MG TABS tablet Take by mouth daily.     escitalopram (LEXAPRO) 20 MG tablet Take 20 mg by mouth daily.     meloxicam (MOBIC) 7.5 MG tablet      metFORMIN (GLUCOPHAGE-XR) 500 MG 24 hr tablet Take 1,000 mg by mouth 2 (two) times daily.     pantoprazole (PROTONIX) 40 MG tablet Take 40 mg by mouth daily.  5   sacubitril-valsartan (ENTRESTO) 49-51 MG Take 1 tablet by mouth 2 (two) times daily. 60 tablet 6   spironolactone (ALDACTONE) 25 MG tablet Take 1 tablet (25 mg total) by mouth daily. STOP taking your Amlodipine 90 tablet 1   torsemide (DEMADEX) 20 MG tablet Take 20 mg by mouth daily.     celecoxib (CELEBREX) 200 MG capsule  (Patient not taking: Reported on 03/15/2023)     diclofenac Sodium (VOLTAREN) 1 % GEL      Dulaglutide (TRULICITY) 0.75 MG/0.5ML SOPN  (Patient not taking: Reported on 02/19/2023)     ezetimibe (ZETIA) 10 MG tablet Take 1 tablet (10 mg total) by mouth  daily. (Patient not taking: Reported on 01/31/2023) 90 tablet 3   LANTUS SOLOSTAR 100 UNIT/ML Solostar Pen Inject 38 Units into the skin daily. (Patient not taking: Reported on 07/16/2023)     oxyCODONE-acetaminophen (PERCOCET) 5-325 MG tablet Take 1 tablet by mouth every 4 (four) hours as needed for severe pain. (Patient not taking: Reported on 03/15/2023) 15 tablet 0   predniSONE (DELTASONE) 5 MG tablet Take 5 mg by mouth daily with breakfast. (Patient not taking: Reported on 03/15/2023)     No current facility-administered medications for this visit.    Allergies  Allergen Reactions   Omeprazole Other (See Comments)    CHEST PAIN    Tramadol Nausea And  Vomiting   Atorvastatin Other (See Comments)    And another statin too, muscle pain   Codeine Anxiety and Other (See Comments)      Social History   Socioeconomic History   Marital status: Married    Spouse name: Not on file   Number of children: Not on file   Years of education: Not on file   Highest education level: Not on file  Occupational History   Not on file  Tobacco Use   Smoking status: Former    Current packs/day: 0.00    Types: Cigarettes    Quit date: 2006    Years since quitting: 18.7   Smokeless tobacco: Current    Types: Snuff  Vaping Use   Vaping status: Never Used  Substance and Sexual Activity   Alcohol use: Yes    Alcohol/week: 12.0 standard drinks of alcohol    Types: 12 Cans of beer per week    Comment: occasonally   Drug use: No   Sexual activity: Not Currently  Other Topics Concern   Not on file  Social History Narrative   Not on file   Social Determinants of Health   Financial Resource Strain: Not on file  Food Insecurity: No Food Insecurity (12/14/2022)   Hunger Vital Sign    Worried About Running Out of Food in the Last Year: Never true    Ran Out of Food in the Last Year: Never true  Transportation Needs: No Transportation Needs (12/14/2022)   PRAPARE - Administrator, Civil Service (Medical): No    Lack of Transportation (Non-Medical): No  Physical Activity: Not on file  Stress: Not on file  Social Connections: Not on file  Intimate Partner Violence: Not At Risk (12/14/2022)   Humiliation, Afraid, Rape, and Kick questionnaire    Fear of Current or Ex-Partner: No    Emotionally Abused: No    Physically Abused: No    Sexually Abused: No      Family History  Problem Relation Age of Onset   Breast cancer Sister     PHYSICAL EXAM: Vitals:   07/16/23 1059  BP: (!) 147/88  Pulse: 90  SpO2: 95%   GENERAL: Elderly AAF  with walker HEENT: Negative for arcus senilis or xanthelasma. There is no scleral icterus.  The  mucous membranes are pink and moist.   NECK: Supple, No masses. Normal carotid upstrokes without bruits. No masses or thyromegaly.    CHEST: There are no chest wall deformities. There is no chest wall tenderness. Respirations are unlabored.  Lungs- CTA B/L CARDIAC:  JVP: 7 cm          Normal rate with regular rhythm. No murmurs, rubs or gallops.  Pulses are 2+ and symmetrical in upper and lower extremities. No edema.  ABDOMEN: Soft, non-tender, non-distended. There are no masses or hepatomegaly. There are normal bowel sounds.  EXTREMITIES: Warm and well perfused with no cyanosis, clubbing.  LYMPHATIC: No axillary or supraclavicular lymphadenopathy.  NEUROLOGIC: Patient is oriented x3 with no focal or lateralizing neurologic deficits.  PSYCH: Patients affect is appropriate, there is no evidence of anxiety or depression.  SKIN: Warm and dry; no lesions or wounds.    DATA REVIEW  ECG: 10/09/22: NSR w/ LBBB (QRSd )  as per my personal interpretation 9/22: Normal sinus rhythm with incomplete left bundle branch block as per my read  ECHO: 06/01/22: LVEF 30-35%, normal RV function  as per my personal interpretation  CATH: 01/01/22: 1.  Mild nonobstructive coronary artery disease. 2.  Left ventricular angiography was not performed.  EF was moderately reduced by echo.  Moderately elevated left ventricular end-diastolic pressure. 3.  Right heart catheterization showed moderately elevated right and left-sided filling pressures, moderate pulmonary hypertension and normal cardiac output.  Pulmonary hypertension seems to be mostly venous.  CMR: 4/24, personally reviewed.  1.  Severely reduced LV systolic function, LVEF 26%.  2.  No evidence for LGE or scar.  3. No evidence for infiltrative disease or amyloid (normal ECV, relatively normal native T1).  4.  Normal RV size and function.  5.  No significant valvular abnormalities.  6.  Findings consistent with non-ischemic  cardiomyopathy.  ASSESSMENT & PLAN:  Heart failure with reduced ejection fraction Etiology of HF: Nonischemic cardiomyopathy as demonstrated by coronary angiography above.  Cardiac MRI from April 2024 without significant LGE or evidence of infiltrative disease.  EKGs from as far back as 2022 demonstrate an incomplete left bundle branch block; QRS duration is not significantly lengthened at that time. Reports that her father may have had CHF and sister; otherwise no FH.  NYHA class / AHA Stage:III, limited by pain in her back/knees; difficult to assess her functional status.  Volume status & Diuretics: torsemide 20mg  daily; euvolemic on exam.  Vasodilators:Entresto 97/103mg  BID, repeat labs today.  Beta-Blocker: HR in the 90s today; increase coreg to 25mg  BID ZOX:[id number] 25mg  daily. Repeat labs today.  Cardiometabolic:farxiga 10mg   Devices therapies & Valvulopathies:Will refer to EP for evaluation for CRT/CPP; repeat EKG today. Repeat TTE prior to EP appt.  Advanced therapies:Not indicated.   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 Rickard Patience.   3. Hyperlipidemia - Followed by Dr. Myriam Forehand - Most recent lipid panel in 3/23 w/ LDL of 126.  - Repeat lipid panel today  4. HTN - See above, repeat labs today.   5. T2DM - A1C 11.5 in 4/23; followed by her PCP.  - repeat today  6. Obesity -Body mass index is 38.74 kg/m. -discussed importance of weight loss  7. Arthritis - Refer to PT  Molly Savarino Advanced Heart Failure Mechanical Circulatory Support

## 2023-07-17 LAB — BASIC METABOLIC PANEL
BUN/Creatinine Ratio: 21 (ref 12–28)
BUN: 32 mg/dL — ABNORMAL HIGH (ref 8–27)
CO2: 20 mmol/L (ref 20–29)
Calcium: 9.9 mg/dL (ref 8.7–10.3)
Chloride: 99 mmol/L (ref 96–106)
Creatinine, Ser: 1.5 mg/dL — ABNORMAL HIGH (ref 0.57–1.00)
Glucose: 177 mg/dL — ABNORMAL HIGH (ref 70–99)
Potassium: 4.1 mmol/L (ref 3.5–5.2)
Sodium: 139 mmol/L (ref 134–144)
eGFR: 37 mL/min/{1.73_m2} — ABNORMAL LOW (ref >59)

## 2023-07-17 LAB — BRAIN NATRIURETIC PEPTIDE: BNP: 38.9 pg/mL (ref 0.0–100.0)

## 2023-07-18 ENCOUNTER — Telehealth: Payer: Self-pay | Admitting: Physician Assistant

## 2023-07-18 ENCOUNTER — Encounter: Payer: Self-pay | Admitting: Emergency Medicine

## 2023-07-18 ENCOUNTER — Emergency Department: Payer: 59

## 2023-07-18 ENCOUNTER — Emergency Department
Admission: EM | Admit: 2023-07-18 | Discharge: 2023-07-19 | Disposition: A | Discharge status: Home / Self Care | Payer: 59 | Attending: Emergency Medicine | Admitting: Emergency Medicine

## 2023-07-18 ENCOUNTER — Other Ambulatory Visit: Payer: Self-pay

## 2023-07-18 DIAGNOSIS — I509 Heart failure, unspecified: Secondary | ICD-10-CM | POA: Insufficient documentation

## 2023-07-18 DIAGNOSIS — I2510 Atherosclerotic heart disease of native coronary artery without angina pectoris: Secondary | ICD-10-CM | POA: Diagnosis not present

## 2023-07-18 DIAGNOSIS — T50905A Adverse effect of unspecified drugs, medicaments and biological substances, initial encounter: Secondary | ICD-10-CM

## 2023-07-18 DIAGNOSIS — I110 Hypertensive heart disease with heart failure: Secondary | ICD-10-CM | POA: Diagnosis not present

## 2023-07-18 DIAGNOSIS — R42 Dizziness and giddiness: Secondary | ICD-10-CM | POA: Insufficient documentation

## 2023-07-18 DIAGNOSIS — T465X5A Adverse effect of other antihypertensive drugs, initial encounter: Secondary | ICD-10-CM | POA: Insufficient documentation

## 2023-07-18 DIAGNOSIS — E860 Dehydration: Secondary | ICD-10-CM

## 2023-07-18 LAB — URINALYSIS, ROUTINE W REFLEX MICROSCOPIC
Bacteria, UA: NONE SEEN
Bilirubin Urine: NEGATIVE
Glucose, UA: >=500 mg/dL — AB
Hgb urine dipstick: NEGATIVE
Ketones, ur: NEGATIVE mg/dL
Leukocytes,Ua: NEGATIVE
Nitrite: NEGATIVE
Protein, ur: NEGATIVE mg/dL
Specific Gravity, Urine: 1.01 (ref 1.005–1.030)
pH: 5 (ref 5.0–8.0)

## 2023-07-18 LAB — LACTIC ACID, PLASMA
Lactic Acid, Venous: 2.7 mmol/L (ref 0.5–1.9)
Lactic Acid, Venous: 2 mmol/L (ref 0.5–1.9)

## 2023-07-18 LAB — BASIC METABOLIC PANEL
Anion gap: 18 — ABNORMAL HIGH (ref 5–15)
BUN: 35 mg/dL — ABNORMAL HIGH (ref 8–23)
CO2: 21 mmol/L — ABNORMAL LOW (ref 22–32)
Calcium: 9.3 mg/dL (ref 8.9–10.3)
Chloride: 95 mmol/L — ABNORMAL LOW (ref 98–111)
Creatinine, Ser: 1.19 mg/dL — ABNORMAL HIGH (ref 0.44–1.00)
GFR, Estimated: 49 mL/min — ABNORMAL LOW (ref >60)
Glucose, Bld: 170 mg/dL — ABNORMAL HIGH (ref 70–99)
Potassium: 3.8 mmol/L (ref 3.5–5.1)
Sodium: 134 mmol/L — ABNORMAL LOW (ref 135–145)

## 2023-07-18 LAB — CBC
HCT: 38.9 % (ref 36.0–46.0)
Hemoglobin: 12.4 g/dL (ref 12.0–15.0)
MCH: 23.4 pg — ABNORMAL LOW (ref 26.0–34.0)
MCHC: 31.9 g/dL (ref 30.0–36.0)
MCV: 73.5 fL — ABNORMAL LOW (ref 80.0–100.0)
Platelets: 200 10*3/uL (ref 150–400)
RBC: 5.29 MIL/uL — ABNORMAL HIGH (ref 3.87–5.11)
RDW: 17.3 % — ABNORMAL HIGH (ref 11.5–15.5)
WBC: 7 10*3/uL (ref 4.0–10.5)
nRBC: 0 % (ref 0.0–0.2)

## 2023-07-18 LAB — TROPONIN I (HIGH SENSITIVITY)
Troponin I (High Sensitivity): 8 ng/L (ref <18)
Troponin I (High Sensitivity): 9 ng/L (ref <18)

## 2023-07-18 LAB — MAGNESIUM: Magnesium: 1.8 mg/dL (ref 1.7–2.4)

## 2023-07-18 LAB — BRAIN NATRIURETIC PEPTIDE: B Natriuretic Peptide: 44 pg/mL (ref 0.0–100.0)

## 2023-07-18 MED ORDER — LACTATED RINGERS IV BOLUS
1000.0000 mL | Freq: Once | INTRAVENOUS | Status: AC
  Administered 2023-07-18: 1000 mL via INTRAVENOUS

## 2023-07-18 NOTE — ED Provider Notes (Signed)
[facility] Provider Note    Event Date/Time   First MD Initiated Contact with Patient 07/18/23 1946     (approximate)   History   Dizziness   HPI  Betty Luna is a 72 y.o. female with past medical history congestive heart failure, hypertension, coronary disease, here with lightheadedness.  The patient reportedly had her Entresto restarted this week.  She states that since then, she has had lightheadedness and dizziness with standing.  She has had low blood pressures.  She also took an increased dose of torsemide at that time.  She denies any lightheadedness or dizziness when sitting down.  No chest pain.  No palpitation.  No shortness of breath.  No other medication change.  No fevers or chills.     Physical Exam   Triage Vital Signs: ED Triage Vitals  Encounter Vitals Group     BP 07/18/23 1913 (!) 87/56     Systolic BP Percentile --      Diastolic BP Percentile --      Pulse Rate 07/18/23 1913 86     Resp 07/18/23 1913 18     Temp 07/18/23 1913 97.9 F (36.6 C)     Temp Source 07/18/23 1913 Oral     SpO2 07/18/23 1913 98 %     Weight 07/18/23 1912 211 lb (95.7 kg)     Height 07/18/23 1912 5\' 2"  (1.575 m)     Head Circumference --      Peak Flow --      Pain Score 07/18/23 1912 0     Pain Loc --      Pain Education --      Exclude from Growth Chart --     Most recent vital signs: Vitals:   07/18/23 2115 07/18/23 2300  BP:  (!) 115/58  Pulse: 76 71  Resp: 19 12  Temp:    SpO2: 98% 99%     General: Awake, no distress.  CV:  Good peripheral perfusion.  Regular rate and rhythm. Resp:  Normal work of breathing.  Lungs clear to auscultation bilaterally. Abd:  No distention.  No tenderness. Other:  Dry mucous membranes.  No significant lower extremity edema.  Cranials 2 through 12 intact.  Strength 5-5 bilateral upper and lower extremities.  Normal station light touch.   ED Results / Procedures / Treatments   Labs (all labs  ordered are listed, but only abnormal results are displayed) Labs Reviewed  BASIC METABOLIC PANEL - Abnormal; Notable for the following components:      Result Value   Sodium 134 (*)    Chloride 95 (*)    CO2 21 (*)    Glucose, Bld 170 (*)    BUN 35 (*)    Creatinine, Ser 1.19 (*)    GFR, Estimated 49 (*)    Anion gap 18 (*)    All other components within normal limits  CBC - Abnormal; Notable for the following components:   RBC 5.29 (*)    MCV 73.5 (*)    MCH 23.4 (*)    RDW 17.3 (*)    All other components within normal limits  LACTIC ACID, PLASMA - Abnormal; Notable for the following components:   Lactic Acid, Venous 2.7 (*)    All other components within normal limits  LACTIC ACID, PLASMA - Abnormal; Notable for the following components:   Lactic Acid, Venous 2.0 (*)    All other components within normal limits  URINALYSIS, ROUTINE  W REFLEX MICROSCOPIC - Abnormal; Notable for the following components:   Color, Urine YELLOW (*)    APPearance CLEAR (*)    Glucose, UA >=500 (*)    All other components within normal limits  BRAIN NATRIURETIC PEPTIDE  MAGNESIUM  TROPONIN I (HIGH SENSITIVITY)  TROPONIN I (HIGH SENSITIVITY)     EKG Normal sinus rhythm, Triklo rate 85.  PR 180, QRS 136, QTc 478.  No acute ST elevations repress or acute events of acute ischemia or infarct.   RADIOLOGY Chest x-ray: No active disease   I also independently reviewed and agree with radiologist interpretations.   PROCEDURES:  Critical Care performed: No   MEDICATIONS ORDERED IN ED: Medications  lactated ringers bolus 1,000 mL (0 mLs Intravenous Stopped 07/18/23 2228)     IMPRESSION / MDM / ASSESSMENT AND PLAN / ED COURSE  I reviewed the triage vital signs and the nursing notes.                              Differential diagnosis includes, but is not limited to, medication effect, anemia, ACS, occult sepsis, deconditioning, orthostasis  Patient's presentation is most consistent  with acute presentation with potential threat to life or bodily function.  The patient is on the cardiac monitor to evaluate for evidence of arrhythmia and/or significant heart rate changes   Well-appearing 72 year old female here with lightheadedness in the setting of hypotension.  Symptoms correlate very directly with an increased dose of torsemide as well as restarting her Entresto.  She previously had this held due to lightheadedness as well.  I suspect this is the primary etiology of her symptoms.  She does also appear mildly clinically dehydrated and had been taking increased dose of Lasix.  She was given IV fluids with significant improvement in her symptoms and she is now ambulatory without difficulty.  Blood pressure is returned to her baseline.  Lab work is very reassuring.  She has no leukocytosis or anemia.  Her BMP is largely at baseline.  She has mild lactic acid elevation which I suspect is multifactorial and seems to be chronic.  It is also cleared with fluids.  She has congestive heart failure, liver disease, and I suspect this is not related to sepsis or shock.  Troponin negative and EKG is nonischemic, do not suspect ACS.  Chest x-ray is clear.     Given patient's resolution of symptoms with fluids, and known trigger of restarting her Sherryll Burger, will plan to stop this, have her slightly increase her p.o. intake at home, and follow-up with her cardiologist as an outpatient.  Return precautions given.   FINAL CLINICAL IMPRESSION(S) / ED DIAGNOSES   Final diagnoses:  Dehydration  Adverse effect of drug, initial encounter     Rx / DC Orders   ED Discharge Orders     None        Note:  This document was prepared using Dragon voice recognition software and may include unintentional dictation errors.   Shaune Pollack, MD 07/19/23 0001

## 2023-07-18 NOTE — ED Triage Notes (Signed)
Pt presents ambulatory to triage via POV with complaints of dizziness for the last week with associated HTN. Pt notes having more dizziness after starting a new BP medication on Tuesday. Pt states having some weakness as a result of her BP fluctuations. A&Ox4 at this time. Denies CP or SOB.

## 2023-07-18 NOTE — Discharge Instructions (Addendum)
I suspect your low blood pressure is from mild dehydration and mostly from the [facility] that was restarted at your recent visit.  For now,  1.) STOP the ENTRESTO (SACUBITRIL/VALSARTAN) medication immediately 2.) Drink slightly more water than usual for 1-2 days. Be careful not to over-do it as you may cause heart failure. 3.) Keep a monitor of your blood pressure at home, at least twice daily. 4.) Call your primary doctor to set up a follow-up this week

## 2023-07-18 NOTE — Telephone Encounter (Signed)
   The patient called the answering service after-hours today. Saw Dr. Gasper Lloyd 07/16/23. Note states, "Appears mildly hypervolemic on exam and has been of some GDMT. Will restart meds, take extra torsemide today. If symptoms continue I have asked her to reach out to [place] ASAP. If symptoms persist at home will plan to go to ER." Patient called answering service because she'says she's continued to feel worse since that time. Has a hard time describing how, "just don't feel good," some increased dizziness. CBG was 230, SBP 109, HR 80. We discussed that it's difficult to evaluate these symptoms over the phone and given the recommendation from the OV note, would encourage her to see care in ER given progression of symptoms. She knows not to drive herself. The patient verbalized understanding and gratitude. Will cc to Dr. Gasper Lloyd as Lorain Childes.  Laurann Montana, PA-C

## 2023-07-22 NOTE — Progress Notes (Unsigned)
Advanced Heart Failure Clinic Note  PCP: Betty Chick, MD  PCP-Cardiologist: Betty Odea, MD  HF-Cardiologist: Betty Nettles, MD  HPI:  Betty Luna is a 72 y.o. female with nonischemic cardiomyopathy, hypertension, hyperlipidemia presenting today for follow up.  Based on chart review it appears that Betty Luna's cardiac history dates back to February 2023 when she presented with symptoms of chest pain; during that time she lost her daughter, stepdaughter and husband was diagnosed with Alzheimer's.  She had an echocardiogram in March 2023 that demonstrated EF of 35 to 40% and follow-up left heart cath with mild nonobstructive CAD.  She was started on GDMT and since that time repeat imaging has demonstrated persistently worsening LV function with cardiac MRI in April 2024 with LVEF of 25% and otherwise preserved RV function.   Last seem by Betty Luna on 07/16/23. 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 At that time, patient had stopped Entresto due to lightheadedness, so the dose was decreased to 24-26 mg BID. Mildly hypervolemic on exam, so torsemide was increased to 40 mg once, then 20 mg daily was resumed.  Seen in the emergency department for lightheadedness. She was hypotensive and lactic acid was mildly elevated at 2.7. She received fluids with symptom improvement and lactic acid began to trend down. Betty Luna was stopped at discharge. Creatinine was 1.19.  Today Betty Luna returns to [facility] for pharmacist medication titration. Reports feeling ***. {Reports/Denies:210917258}. {ACTIONS;DENIES/REPORTS:21021675::"Denies"} being able to complete all activities of daily living (ADLs). Is *** active throughout the day. Weight at home is *** pounds. Takes {CHL AMB [facility] Medications:210917260} ***. Appetite ***. {Does Follow/Does Not Follow:210917261} a low sodium diet.  Current HF  Medications: -Carvedilol 25 mg BID -Spironolactone 25 mg daily -Farxiga 10 mg daily -Torsemide 20 mg daily  Has the patient been experiencing any side effects to the medications prescribed? {yes/no:20286}  Does the patient have any problems obtaining medications due to transportation or finances? {yes/no:20286}  Understanding of regimen: {CHL AMB [facility] Excellent/Good/Fair/Poor:210917262}  Understanding of indications: {CHL AMB [facility] Excellent/Good/Fair/Poor:210917262}  Potential of adherence: {CHL AMB [facility] Excellent/Good/Fair/Poor:210917262}  Patient understands to avoid NSAIDs.  Patient understands to avoid decongestants.  Pertinent Lab Values: Creatinine  Date Value Ref Range Status  09/02/2014 0.79 0.60 - 1.30 mg/dL Final   Creatinine, Ser  Date Value Ref Range Status  07/18/2023 1.19 (H) 0.44 - 1.00 mg/dL Final   BUN  Date Value Ref Range Status  07/18/2023 35 (H) 8 - 23 mg/dL Final  28/41/3244 32 (H) 8 - 27 mg/dL Final  10/24/7251 14 7 - 18 mg/dL Final   Potassium  Date Value Ref Range Status  07/18/2023 3.8 3.5 - 5.1 mmol/L Final  09/02/2014 3.8 3.5 - 5.1 mmol/L Final   Sodium  Date Value Ref Range Status  07/18/2023 134 (L) 135 - 145 mmol/L Final  07/16/2023 139 134 - 144 mmol/L Final  09/02/2014 138 136 - 145 mmol/L Final   B Natriuretic Peptide  Date Value Ref Range Status  07/18/2023 44.0 0.0 - 100.0 pg/mL Final    Comment:    Performed at [facility], [address] [place], [place] [address]   Magnesium  Date Value Ref Range Status  07/18/2023 1.8 1.7 - 2.4 mg/dL Final    Comment:    Performed at [facility], [address] [place], [place] [address]   TSH  Date Value Ref Range Status  06/02/2016 0.837  0.350 - 4.500 uIU/mL Final    Vital Signs: There were no vitals filed for this visit.  Assessment/Plan: Heart failure with reduced ejection fraction Etiology of HF: Nonischemic cardiomyopathy as demonstrated by  coronary angiography above.  Cardiac MRI from April 2024 without significant LGE or evidence of infiltrative disease.  EKGs from as far back as 2022 demonstrate an incomplete left bundle branch block; QRS duration is not significantly lengthened at that time. Reports that her father may have had CHF and sister; otherwise no FH.  NYHA class / AHA Stage:III, limited by pain in her back/knees; difficult to assess her functional status.  Volume status & Diuretics: torsemide 20mg  daily; euvolemic on exam.  Vasodilators:Entresto stopped due to lightheadedness Beta-Blocker: HR in the 90s today; increase coreg to 25mg  BID FAO:[id number] 25mg  daily. Repeat labs today.  Cardiometabolic:farxiga 10mg   Devices therapies & Valvulopathies:Will refer to EP for evaluation for CRT/CPP; repeat EKG today. Repeat TTE prior to EP appt.  Advanced therapies:Not indicated.    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    3. Hyperlipidemia - Followed by Betty Luna - Most recent lipid panel in 3/23 w/ LDL of 126.  - Repeat lipid panel today   4. HTN - See above, repeat labs today.    5. T2DM - A1C 11.5 in 4/23; followed by her PCP.  - repeat today   6. Obesity -Body mass index is 38.74 kg/m. -discussed importance of weight loss   7. Arthritis - Refer to PT Follow up: ***  ***

## 2023-07-24 ENCOUNTER — Ambulatory Visit: Payer: 59 | Attending: Cardiology | Admitting: Pharmacist

## 2023-07-24 VITALS — BP 130/82 | HR 95 | Wt 212.2 lb

## 2023-07-24 DIAGNOSIS — I5022 Chronic systolic (congestive) heart failure: Secondary | ICD-10-CM

## 2023-07-24 MED ORDER — LOSARTAN POTASSIUM 25 MG PO TABS: 25.0000 mg | ORAL_TABLET | Freq: Every day | ORAL | 3 refills | Status: DC

## 2023-07-24 NOTE — Patient Instructions (Signed)
It was a pleasure seeing you today!  MEDICATIONS: -We are changing your medications today -Start losartan 25 mg daily  -Call if you have questions about your medications.  LABS: -Checking labs today. We will call you if your labs need attention.  NEXT APPOINTMENT: Return to clinic on 10/21/23 with Dr. Gasper Lloyd.  In general, to take care of your heart failure: -Limit your fluid intake to 2 Liters (half-gallon) per day.   -Limit your salt intake to ideally 2-3 grams (2000-3000 mg) per day. -Weigh yourself daily and record, and bring that "weight diary" to your next appointment.  (Weight gain of 2-3 pounds in 1 day typically means fluid weight.) -The medications for your heart are to help your heart and help you live longer.   -Please contact us before stopping any of your heart medications.  Call the clinic at [phone] with questions or to reschedule future appointments.

## 2023-07-25 ENCOUNTER — Telehealth: Payer: Self-pay | Admitting: Pharmacist

## 2023-07-25 LAB — TSH: TSH: 1.03 u[IU]/mL (ref 0.450–4.500)

## 2023-07-25 LAB — BASIC METABOLIC PANEL
BUN/Creatinine Ratio: 23 (ref 12–28)
BUN: 31 mg/dL — ABNORMAL HIGH (ref 8–27)
CO2: 22 mmol/L (ref 20–29)
Calcium: 10 mg/dL (ref 8.7–10.3)
Chloride: 102 mmol/L (ref 96–106)
Creatinine, Ser: 1.33 mg/dL — ABNORMAL HIGH (ref 0.57–1.00)
Glucose: 156 mg/dL — ABNORMAL HIGH (ref 70–99)
Potassium: 4.1 mmol/L (ref 3.5–5.2)
Sodium: 143 mmol/L (ref 134–144)
eGFR: 43 mL/min/{1.73_m2} — ABNORMAL LOW (ref >59)

## 2023-07-25 LAB — HGB A1C W/O EAG: Hgb A1c MFr Bld: 8.1 % — ABNORMAL HIGH (ref 4.8–5.6)

## 2023-07-25 MED ORDER — TORSEMIDE 20 MG PO TABS: ORAL_TABLET | ORAL | 3 refills | Status: DC

## 2023-07-25 NOTE — Telephone Encounter (Signed)
Creatinine and BUN still somewhat elevated. Will change torsemide to 20 mg every other day with an additional 20 mg as needed for shortness of breath or swelling and weight gain of 3 lbs in 24 hours or 5 lbs in a week. Patient educated and voiced understanding.

## 2023-08-02 ENCOUNTER — Inpatient Hospital Stay: Payer: 59 | Attending: Oncology

## 2023-08-05 NOTE — Pre-Procedure Instructions (Signed)
Instructed patient on the following items: Arrival time 0730 Nothing to eat or drink after midnight No meds AM of procedure Responsible person to drive you home and stay with you for 24 hrs Wash with special soap night before and morning of procedure If on anti-coagulant drug instructions ASA- stop 5 days ago

## 2023-08-06 ENCOUNTER — Ambulatory Visit (HOSPITAL_COMMUNITY): Payer: 59

## 2023-08-06 ENCOUNTER — Other Ambulatory Visit: Payer: Self-pay

## 2023-08-06 ENCOUNTER — Encounter (HOSPITAL_COMMUNITY)
Admission: RE | Disposition: A | Discharge status: Home / Self Care | Payer: 59 | Source: Home / Self Care | Attending: Cardiology

## 2023-08-06 ENCOUNTER — Ambulatory Visit (HOSPITAL_COMMUNITY)
Admission: RE | Admit: 2023-08-06 | Discharge: 2023-08-06 | Disposition: A | Discharge status: Home / Self Care | Payer: 59 | Attending: Cardiology | Admitting: Cardiology

## 2023-08-06 DIAGNOSIS — M25561 Pain in right knee: Secondary | ICD-10-CM | POA: Insufficient documentation

## 2023-08-06 DIAGNOSIS — R785 Finding of other psychotropic drug in blood: Secondary | ICD-10-CM | POA: Insufficient documentation

## 2023-08-06 DIAGNOSIS — I5022 Chronic systolic (congestive) heart failure: Secondary | ICD-10-CM | POA: Insufficient documentation

## 2023-08-06 DIAGNOSIS — I447 Left bundle-branch block, unspecified: Secondary | ICD-10-CM | POA: Diagnosis not present

## 2023-08-06 DIAGNOSIS — F1090 Alcohol use, unspecified, uncomplicated: Secondary | ICD-10-CM | POA: Insufficient documentation

## 2023-08-06 DIAGNOSIS — I428 Other cardiomyopathies: Secondary | ICD-10-CM | POA: Insufficient documentation

## 2023-08-06 DIAGNOSIS — Z6839 Body mass index (BMI) 39.0-39.9, adult: Secondary | ICD-10-CM | POA: Insufficient documentation

## 2023-08-06 DIAGNOSIS — I2510 Atherosclerotic heart disease of native coronary artery without angina pectoris: Secondary | ICD-10-CM | POA: Diagnosis not present

## 2023-08-06 DIAGNOSIS — M25562 Pain in left knee: Secondary | ICD-10-CM | POA: Diagnosis not present

## 2023-08-06 DIAGNOSIS — I110 Hypertensive heart disease with heart failure: Secondary | ICD-10-CM | POA: Diagnosis present

## 2023-08-06 HISTORY — PX: BIV ICD INSERTION CRT-D: EP1195

## 2023-08-06 LAB — GLUCOSE, CAPILLARY
Glucose-Capillary: 167 mg/dL — ABNORMAL HIGH (ref 70–99)
Glucose-Capillary: 175 mg/dL — ABNORMAL HIGH (ref 70–99)

## 2023-08-06 SURGERY — BIV ICD INSERTION CRT-D

## 2023-08-06 MED ORDER — FENTANYL CITRATE (PF) 100 MCG/2ML IJ SOLN
INTRAMUSCULAR | Status: AC
  Filled 2023-08-06: qty 2

## 2023-08-06 MED ORDER — ASPIRIN EC 81 MG PO TBEC: 81.0000 mg | DELAYED_RELEASE_TABLET | Freq: Every day | ORAL | 11 refills | Status: AC

## 2023-08-06 MED ORDER — POVIDONE-IODINE 10 % EX SWAB
2.0000 | Freq: Once | CUTANEOUS | Status: AC
  Administered 2023-08-06: 2 via TOPICAL

## 2023-08-06 MED ORDER — IOHEXOL 350 MG/ML SOLN
INTRAVENOUS | Status: DC | PRN
  Administered 2023-08-06: 10 mL

## 2023-08-06 MED ORDER — MIDAZOLAM HCL 5 MG/5ML IJ SOLN
INTRAMUSCULAR | Status: DC | PRN
  Administered 2023-08-06 (×4): 1 mg via INTRAVENOUS

## 2023-08-06 MED ORDER — HEPARIN (PORCINE) IN NACL 1000-0.9 UT/500ML-% IV SOLN
INTRAVENOUS | Status: DC | PRN
  Administered 2023-08-06: 500 mL

## 2023-08-06 MED ORDER — FENTANYL CITRATE (PF) 100 MCG/2ML IJ SOLN
INTRAMUSCULAR | Status: DC | PRN
  Administered 2023-08-06 (×5): 25 ug via INTRAVENOUS

## 2023-08-06 MED ORDER — ACETAMINOPHEN 325 MG PO TABS
325.0000 mg | ORAL_TABLET | ORAL | Status: DC | PRN
  Administered 2023-08-06: 650 mg via ORAL
  Filled 2023-08-06: qty 2

## 2023-08-06 MED ORDER — LIDOCAINE HCL (PF) 1 % IJ SOLN
INTRAMUSCULAR | Status: DC | PRN
  Administered 2023-08-06: 60 mL

## 2023-08-06 MED ORDER — CEFAZOLIN SODIUM-DEXTROSE 2-4 GM/100ML-% IV SOLN: 2.0000 g | INTRAVENOUS | Status: AC

## 2023-08-06 MED ORDER — LIDOCAINE HCL (PF) 1 % IJ SOLN
INTRAMUSCULAR | Status: AC
  Filled 2023-08-06: qty 60

## 2023-08-06 MED ORDER — CEFAZOLIN SODIUM-DEXTROSE 2-4 GM/100ML-% IV SOLN
INTRAVENOUS | Status: AC
  Administered 2023-08-06: 2 g via INTRAVENOUS
  Filled 2023-08-06: qty 100

## 2023-08-06 MED ORDER — SODIUM CHLORIDE 0.9 % IV SOLN
INTRAVENOUS | Status: AC
  Administered 2023-08-06: 80 mg
  Filled 2023-08-06: qty 2

## 2023-08-06 MED ORDER — MIDAZOLAM HCL 5 MG/5ML IJ SOLN
INTRAMUSCULAR | Status: AC
  Filled 2023-08-06: qty 5

## 2023-08-06 MED ORDER — ONDANSETRON HCL 4 MG/2ML IJ SOLN: 4.0000 mg | Freq: Four times a day (QID) | INTRAMUSCULAR | Status: DC | PRN

## 2023-08-06 MED ORDER — SODIUM CHLORIDE 0.9 % IV SOLN: 80.0000 mg | INTRAVENOUS | Status: AC

## 2023-08-06 MED ORDER — SODIUM CHLORIDE 0.9 % IV SOLN: INTRAVENOUS | Status: DC

## 2023-08-06 SURGICAL SUPPLY — 21 items
BALLN COR SINUS VENO 6FR 80 (BALLOONS) ×1
BALLOON COR SINUS VENO 6FR 80 (BALLOONS) IMPLANT
CABLE SURGICAL S-101-97-12 (CABLE) ×1 IMPLANT
CATH CPS DIRECT 135 DS2C020 (CATHETERS) IMPLANT
CATH CPS LOCATOR 3D MED (CATHETERS) IMPLANT
GUIDEWIRE VASC J-TIP .035X150 (WIRE) IMPLANT
HELIX LOCKING TOOL (MISCELLANEOUS) ×1
ICD UNIFY ASUR CRT CD3357-40Q (ICD Generator) IMPLANT
LEAD DURATA 7122Q-58CM (Lead) IMPLANT
LEAD ULTIPACE 52 LPA1231/52 (Lead) IMPLANT
LEAD ULTIPACE 65 LPA1231/65 (Lead) IMPLANT
MAT PREVALON FULL STRYKER (MISCELLANEOUS) IMPLANT
PAD DEFIB RADIO PHYSIO CONN (PAD) ×1 IMPLANT
SHEATH 7FR PRELUDE SNAP 13 (SHEATH) IMPLANT
SHEATH 9.5FR PRELUDE SNAP 13 (SHEATH) IMPLANT
SHEATH PROBE COVER 6X72 (BAG) IMPLANT
SLITTER AGILIS HISPRO (INSTRUMENTS) IMPLANT
TOOL HELIX LOCKING (MISCELLANEOUS) IMPLANT
TRAY PACEMAKER INSERTION (PACKS) ×1 IMPLANT
WIRE ACUITY WHISPER EDS 4648 (WIRE) IMPLANT
WIRE HI TORQ VERSACORE-J 145CM (WIRE) IMPLANT

## 2023-08-06 NOTE — H&P (Signed)
Electrophysiology Office Note:     Date:  08/06/2023    ID:  Betty Luna, DOB 1951/01/27, MRN [id number]   [facility] Cardiologist:  Debbe Odea, MD  [facility] Electrophysiologist:  Lanier Prude, MD    Referring MD: Dorthula Nettles, DO    Chief Complaint: Chronic systolic heart failure   History of Present Illness:     Betty Luna is a 72 y.o. femalewho I am seeing today for an evaluation of chronic systolic heart failure at the request of Dr. Gasper Lloyd.   The patient was last seen by Dr. Gasper Lloyd on Mar 15, 2023.   The patient has a medical history that includes nonischemic cardiomyopathy, hypertension, hyperlipidemia.   Her cardiomyopathy was diagnosed in February 2023.  Left heart cath showed nonobstructive coronary artery disease.  Cardiac MRI in April 2024 showed an ejection fraction of 25% with normal RV function.  She is very limited due to bilateral knee pain.   She tells me that she feels short of breath activity.  She is limited by back pain.  No syncope or presyncope.  She does take her medications.  She does endorse heavy alcohol use on the weekend.   She presents for CRTD implant today. Procedure reviewed.     Objective Their past medical, social and family history was reveiwed.     ROS:   Please see the history of present illness.    All other systems reviewed and are negative.   EKGs/Labs/Other Studies Reviewed:     The following studies were reviewed today:   February 06, 2023 cardiac MRI EF 26% Septal dyskinesis suggestive of bundle branch block No LGE   June 01, 2022 echo EF 30 to 35% RV normal Mildly dilated left atrium Mild MR   Mar 15, 2023 EKG shows sinus rhythm.  QRS duration 120 to 130 ms.          Physical Exam:     VS:  Ht 5\' 2"  (1.575 m)   Wt 214 lb (97.1 kg)   SpO2 97%   BMI 39.14 kg/m    162/80, 89, 98.68F       Wt Readings from Last 3 Encounters:  06/14/23 214 lb (97.1 kg)  03/15/23 206  lb (93.4 kg)  02/19/23 206 lb (93.4 kg)      GEN:  Well nourished, well developed in no acute distress.  Obese CARDIAC: RRR, no murmurs, rubs, gallops RESPIRATORY:  Clear to auscultation without rales, wheezing or rhonchi          Assessment ASSESSMENT AND PLAN:     1. Chronic systolic CHF (congestive heart failure) (HCC)   2. Morbid obesity (HCC)   3. Primary hypertension   4. NICM (nonischemic cardiomyopathy) (HCC)       #Chronic systolic heart failure #Left bundle branch block #Nonischemic cardiomyopathy The patient has NYHA class III symptoms.  Follows with Dr. Gasper Lloyd in the heart failure clinic.  On good medical therapy with Coreg, [place] Custard, spironolactone and torsemide. Her EKG shows a left bundle branch block with a QRS duration between 120 and 130 ms.  It has widened with time and that corresponds to a reduced left ventricular function.  There is some dyssynchrony on imaging (echo and MRI).   She clearly has an indication for ICD given a persistently reduced ejection fraction in the setting of nonischemic cardiomyopathy on guideline directed medical therapy.  Given her left bundle branch block morphology EKG and dyssynchrony, I think it would be reasonable to  implant a CS lead at the time of ICD implant.  I have discussed the likelihood of improving her ejection fraction and how it is not as high as patients who have a much wider QRS and more dyssynchrony on echo.  She understands this and wishes to proceed.   --------   The patient has a non ischemic CM (EF 26%), NYHA Class III CHF, and CAD.  He is referred by Dr. Gasper Lloyd for risk stratification of sudden death and consideration of ICD implantation.  At this time, she meets criteria for ICD implantation for primary prevention of sudden death.  I have had a thorough discussion with the patient reviewing options.  The patient and their family (if available) have had opportunities to ask questions and have them  answered. The patient and I have decided together through a shared decision making process to proceed with ICD implant at this time.     Risks, benefits, alternatives to ICD implantation were discussed in detail with the patient today. The patient understands that the risks include but are not limited to bleeding, infection, pneumothorax, perforation, tamponade, vascular damage, renal failure, MI, stroke, death, inappropriate shocks, and lead dislodgement and wishes to proceed.  We will therefore schedule device implantation at the next available time.   #Alcohol use The patient does consume a large amount of alcohol.  I suspect this is contributing to her cardiomyopathy.  I have encouraged her to cut down or stop.        Presents for CRT-D implant today. Procedure reviewed.     Signed, Rossie Muskrat. Lalla Brothers, MD, [facility], [facility] 08/06/2023 Electrophysiology [facility]

## 2023-08-06 NOTE — Discharge Instructions (Signed)
After Your ICD (Implantable Cardiac Defibrillator)   You have a Medtronic ICD  ACTIVITY Do not lift your arm above shoulder height for 1 week after your procedure. After 7 days, you may progress as below.  You should remove your sling 24 hours after your procedure, unless otherwise instructed by your provider.     Tuesday August 13, 2023  Wednesday August 14, 2023 Thursday August 15, 2023 Friday August 16, 2023   Do not lift, push, pull, or carry anything over 10 pounds with the affected arm until 6 weeks (Tuesday September 17, 2023 ) after your procedure.   You may drive AFTER your wound check, unless you have been told otherwise by your provider.   Ask your healthcare provider when you can go back to work   INCISION/Dressing  If large square, outer bandage is left in place, this can be removed TOMORROW AT 12 NOON. Do not remove steri-strips or glue as below.   Monitor your defibrillator site for redness, swelling, and drainage. Call the device clinic at [phone] if you experience these symptoms or fever/chills.  If your incision is sealed with Steri-strips or staples, you may shower 7 days after your procedure or when told by your provider. Do not remove the steri-strips or let the shower hit directly on your site. You may wash around your site with soap and water.    If you were discharged in a sling, please do not wear this during the day more than 48 hours after your surgery unless otherwise instructed. This may increase the risk of stiffness and soreness in your shoulder.   Avoid lotions, ointments, or perfumes over your incision until it is well-healed.  You may use a hot tub or a pool AFTER your wound check appointment if the incision is completely closed.  Your ICD is designed to protect you from life threatening heart rhythms. Because of this, you may receive a shock.   1 shock with no symptoms:  Call the office during business hours. 1 shock with symptoms (chest  pain, chest pressure, dizziness, lightheadedness, shortness of breath, overall feeling unwell):  Call 911. If you experience 2 or more shocks in 24 hours:  Call 911. If you receive a shock, you should not drive for 6 months per the [place] DMV IF you receive appropriate therapy from your ICD.   ICD Alerts:  Some alerts are vibratory and others beep. These are NOT emergencies. Please call our office to let us know. If this occurs at night or on weekends, it can wait until the next business day. Send a remote transmission.  If your device is capable of reading fluid status (for heart failure), you will be offered monthly monitoring to review this with you.   DEVICE MANAGEMENT Remote monitoring is used to monitor your ICD from home. This monitoring is scheduled every 91 days by our office. It allows [place] to keep an eye on the functioning of your device to ensure it is working properly. You will routinely see your Electrophysiologist annually (more often if necessary).   You should receive your ID card for your new device in 4-8 weeks. Keep this card with you at all times once received. Consider wearing a medical alert bracelet or necklace.  Your ICD  may be MRI compatible. This will be discussed at your next office visit/wound check.  You should avoid contact with strong electric or magnetic fields.   Do not use amateur (ham) radio equipment or electric (arc) welding torches.  MP3 player headphones with magnets should not be used. Some devices are safe to use if held at least 12 inches (30 cm) from your defibrillator. These include power tools, lawn mowers, and speakers. If you are unsure if something is safe to use, ask your health care provider.  When using your cell phone, hold it to the ear that is on the opposite side from the defibrillator. Do not leave your cell phone in a pocket over the defibrillator.  You may safely use electric blankets, heating pads, computers, and microwave ovens.  Call the  office right away if: You have chest pain. You feel more than one shock. You feel more short of breath than you have felt before. You feel more light-headed than you have felt before. Your incision starts to open up.  This information is not intended to replace advice given to you by your health care provider. Make sure you discuss any questions you have with your health care provider.

## 2023-08-07 ENCOUNTER — Inpatient Hospital Stay: Payer: 59

## 2023-08-07 ENCOUNTER — Inpatient Hospital Stay: Payer: 59 | Admitting: Oncology

## 2023-08-07 ENCOUNTER — Encounter (HOSPITAL_COMMUNITY): Payer: Self-pay | Admitting: Cardiology

## 2023-08-12 ENCOUNTER — Emergency Department: Payer: 59

## 2023-08-12 ENCOUNTER — Other Ambulatory Visit: Payer: Self-pay

## 2023-08-12 DIAGNOSIS — Z7984 Long term (current) use of oral hypoglycemic drugs: Secondary | ICD-10-CM | POA: Diagnosis not present

## 2023-08-12 DIAGNOSIS — R42 Dizziness and giddiness: Secondary | ICD-10-CM | POA: Diagnosis present

## 2023-08-12 DIAGNOSIS — N1831 Chronic kidney disease, stage 3a: Secondary | ICD-10-CM | POA: Diagnosis not present

## 2023-08-12 DIAGNOSIS — Z950 Presence of cardiac pacemaker: Secondary | ICD-10-CM | POA: Diagnosis not present

## 2023-08-12 DIAGNOSIS — E1122 Type 2 diabetes mellitus with diabetic chronic kidney disease: Secondary | ICD-10-CM | POA: Insufficient documentation

## 2023-08-12 DIAGNOSIS — Z79899 Other long term (current) drug therapy: Secondary | ICD-10-CM | POA: Diagnosis not present

## 2023-08-12 DIAGNOSIS — I951 Orthostatic hypotension: Secondary | ICD-10-CM | POA: Diagnosis not present

## 2023-08-12 DIAGNOSIS — Z87891 Personal history of nicotine dependence: Secondary | ICD-10-CM | POA: Insufficient documentation

## 2023-08-12 DIAGNOSIS — Z7982 Long term (current) use of aspirin: Secondary | ICD-10-CM | POA: Diagnosis not present

## 2023-08-12 DIAGNOSIS — N390 Urinary tract infection, site not specified: Secondary | ICD-10-CM | POA: Insufficient documentation

## 2023-08-12 DIAGNOSIS — I130 Hypertensive heart and chronic kidney disease with heart failure and stage 1 through stage 4 chronic kidney disease, or unspecified chronic kidney disease: Secondary | ICD-10-CM | POA: Diagnosis not present

## 2023-08-12 DIAGNOSIS — E1140 Type 2 diabetes mellitus with diabetic neuropathy, unspecified: Secondary | ICD-10-CM | POA: Insufficient documentation

## 2023-08-12 DIAGNOSIS — I5022 Chronic systolic (congestive) heart failure: Secondary | ICD-10-CM | POA: Insufficient documentation

## 2023-08-12 LAB — URINALYSIS, ROUTINE W REFLEX MICROSCOPIC
Bilirubin Urine: NEGATIVE
Glucose, UA: 150 mg/dL — AB
Hgb urine dipstick: NEGATIVE
Ketones, ur: NEGATIVE mg/dL
Nitrite: NEGATIVE
Protein, ur: NEGATIVE mg/dL
Specific Gravity, Urine: 1.012 (ref 1.005–1.030)
pH: 5 (ref 5.0–8.0)

## 2023-08-12 LAB — COMPREHENSIVE METABOLIC PANEL
ALT: 34 U/L (ref 0–44)
AST: 41 U/L (ref 15–41)
Albumin: 3.5 g/dL (ref 3.5–5.0)
Alkaline Phosphatase: 105 U/L (ref 38–126)
Anion gap: 12 (ref 5–15)
BUN: 28 mg/dL — ABNORMAL HIGH (ref 8–23)
CO2: 23 mmol/L (ref 22–32)
Calcium: 9.3 mg/dL (ref 8.9–10.3)
Chloride: 102 mmol/L (ref 98–111)
Creatinine, Ser: 1.12 mg/dL — ABNORMAL HIGH (ref 0.44–1.00)
GFR, Estimated: 53 mL/min — ABNORMAL LOW (ref >60)
Glucose, Bld: 148 mg/dL — ABNORMAL HIGH (ref 70–99)
Potassium: 3.8 mmol/L (ref 3.5–5.1)
Sodium: 137 mmol/L (ref 135–145)
Total Bilirubin: 0.7 mg/dL (ref 0.3–1.2)
Total Protein: 7.6 g/dL (ref 6.5–8.1)

## 2023-08-12 LAB — TROPONIN I (HIGH SENSITIVITY)
Troponin I (High Sensitivity): 10 ng/L (ref <18)
Troponin I (High Sensitivity): 10 ng/L (ref <18)

## 2023-08-12 LAB — CBC
HCT: 38.3 % (ref 36.0–46.0)
Hemoglobin: 12.4 g/dL (ref 12.0–15.0)
MCH: 24.2 pg — ABNORMAL LOW (ref 26.0–34.0)
MCHC: 32.4 g/dL (ref 30.0–36.0)
MCV: 74.7 fL — ABNORMAL LOW (ref 80.0–100.0)
Platelets: 281 10*3/uL (ref 150–400)
RBC: 5.13 MIL/uL — ABNORMAL HIGH (ref 3.87–5.11)
RDW: 16.3 % — ABNORMAL HIGH (ref 11.5–15.5)
WBC: 7.7 10*3/uL (ref 4.0–10.5)
nRBC: 0 % (ref 0.0–0.2)

## 2023-08-12 MED FILL — Lactated Ringer's Solution: INTRAVENOUS | Qty: 1000 | Status: AC

## 2023-08-12 NOTE — ED Triage Notes (Signed)
Pt presents to ER with c/o hypotension.  Pt states she had pacemaker placed last week on Tuesday.  Pt reports today, she was feeling dizzy and light-headed, checked her BP and states it was around 105/60.  Pt denies chest pain, but states she has some dyspnea on exertion.  Pt otherwise A&O x4 and in NAD at this time.

## 2023-08-13 ENCOUNTER — Observation Stay
Admission: EM | Admit: 2023-08-13 | Discharge: 2023-08-14 | Disposition: A | Discharge status: Home / Self Care | Payer: 59 | Attending: Internal Medicine | Admitting: Internal Medicine

## 2023-08-13 ENCOUNTER — Encounter: Payer: Self-pay | Admitting: Family Medicine

## 2023-08-13 DIAGNOSIS — I5022 Chronic systolic (congestive) heart failure: Secondary | ICD-10-CM | POA: Diagnosis present

## 2023-08-13 DIAGNOSIS — I951 Orthostatic hypotension: Secondary | ICD-10-CM

## 2023-08-13 DIAGNOSIS — I959 Hypotension, unspecified: Secondary | ICD-10-CM

## 2023-08-13 DIAGNOSIS — R42 Dizziness and giddiness: Principal | ICD-10-CM

## 2023-08-13 DIAGNOSIS — F32A Depression, unspecified: Secondary | ICD-10-CM | POA: Diagnosis not present

## 2023-08-13 DIAGNOSIS — M109 Gout, unspecified: Secondary | ICD-10-CM

## 2023-08-13 DIAGNOSIS — E1142 Type 2 diabetes mellitus with diabetic polyneuropathy: Secondary | ICD-10-CM | POA: Diagnosis not present

## 2023-08-13 DIAGNOSIS — N390 Urinary tract infection, site not specified: Secondary | ICD-10-CM | POA: Diagnosis present

## 2023-08-13 DIAGNOSIS — N1831 Chronic kidney disease, stage 3a: Secondary | ICD-10-CM | POA: Diagnosis present

## 2023-08-13 LAB — CBC
HCT: 37.2 % (ref 36.0–46.0)
Hemoglobin: 11.7 g/dL — ABNORMAL LOW (ref 12.0–15.0)
MCH: 23.5 pg — ABNORMAL LOW (ref 26.0–34.0)
MCHC: 31.5 g/dL (ref 30.0–36.0)
MCV: 74.7 fL — ABNORMAL LOW (ref 80.0–100.0)
Platelets: 267 10*3/uL (ref 150–400)
RBC: 4.98 MIL/uL (ref 3.87–5.11)
RDW: 16.2 % — ABNORMAL HIGH (ref 11.5–15.5)
WBC: 9 10*3/uL (ref 4.0–10.5)
nRBC: 0 % (ref 0.0–0.2)

## 2023-08-13 LAB — BASIC METABOLIC PANEL
Anion gap: 12 (ref 5–15)
BUN: 31 mg/dL — ABNORMAL HIGH (ref 8–23)
CO2: 24 mmol/L (ref 22–32)
Calcium: 9.3 mg/dL (ref 8.9–10.3)
Chloride: 99 mmol/L (ref 98–111)
Creatinine, Ser: 1.09 mg/dL — ABNORMAL HIGH (ref 0.44–1.00)
GFR, Estimated: 54 mL/min — ABNORMAL LOW (ref >60)
Glucose, Bld: 127 mg/dL — ABNORMAL HIGH (ref 70–99)
Potassium: 3.8 mmol/L (ref 3.5–5.1)
Sodium: 135 mmol/L (ref 135–145)

## 2023-08-13 LAB — LACTIC ACID, PLASMA: Lactic Acid, Venous: 1.5 mmol/L (ref 0.5–1.9)

## 2023-08-13 LAB — PROCALCITONIN: Procalcitonin: 0.22 ng/mL

## 2023-08-13 MED ORDER — ONDANSETRON HCL 4 MG/2ML IJ SOLN: 4.0000 mg | Freq: Four times a day (QID) | INTRAMUSCULAR | Status: DC | PRN

## 2023-08-13 MED ORDER — SODIUM CHLORIDE 0.9 % IV SOLN
1.0000 g | Freq: Once | INTRAVENOUS | Status: AC
  Administered 2023-08-13: 1 g via INTRAVENOUS
  Filled 2023-08-13: qty 10

## 2023-08-13 MED ORDER — ENOXAPARIN SODIUM 60 MG/0.6ML IJ SOSY
0.5000 mg/kg | PREFILLED_SYRINGE | INTRAMUSCULAR | Status: DC
  Administered 2023-08-13 – 2023-08-14 (×2): 50 mg via SUBCUTANEOUS
  Filled 2023-08-13 (×2): qty 0.6

## 2023-08-13 MED ORDER — DAPAGLIFLOZIN PROPANEDIOL 10 MG PO TABS
10.0000 mg | ORAL_TABLET | Freq: Every day | ORAL | Status: DC
  Administered 2023-08-13 – 2023-08-14 (×2): 10 mg via ORAL
  Filled 2023-08-13 (×3): qty 1

## 2023-08-13 MED ORDER — SODIUM CHLORIDE 0.9 % IV SOLN
1.0000 g | INTRAVENOUS | Status: DC
  Administered 2023-08-14: 1 g via INTRAVENOUS
  Filled 2023-08-13: qty 10

## 2023-08-13 MED ORDER — ONDANSETRON HCL 4 MG PO TABS: 4.0000 mg | ORAL_TABLET | Freq: Four times a day (QID) | ORAL | Status: DC | PRN

## 2023-08-13 MED ORDER — MAGNESIUM HYDROXIDE 400 MG/5ML PO SUSP: 30.0000 mL | Freq: Every day | ORAL | Status: DC | PRN

## 2023-08-13 MED ORDER — LACTATED RINGERS IV BOLUS (SEPSIS)
500.0000 mL | Freq: Once | INTRAVENOUS | Status: AC
  Administered 2023-08-13: 500 mL via INTRAVENOUS

## 2023-08-13 MED ORDER — CEPHALEXIN 500 MG PO CAPS: 500.0000 mg | ORAL_CAPSULE | Freq: Once | ORAL | Status: DC

## 2023-08-13 MED ORDER — SODIUM CHLORIDE 0.9 % IV SOLN: INTRAVENOUS | Status: DC

## 2023-08-13 MED ORDER — FERROUS SULFATE 325 (65 FE) MG PO TABS
325.0000 mg | ORAL_TABLET | Freq: Every day | ORAL | Status: DC
  Administered 2023-08-13 – 2023-08-14 (×2): 325 mg via ORAL
  Filled 2023-08-13 (×2): qty 1

## 2023-08-13 MED ORDER — TRAZODONE HCL 50 MG PO TABS: 25.0000 mg | ORAL_TABLET | Freq: Every evening | ORAL | Status: DC | PRN

## 2023-08-13 MED ORDER — METHYLPREDNISOLONE SODIUM SUCC 125 MG IJ SOLR
60.0000 mg | Freq: Once | INTRAMUSCULAR | Status: AC
  Administered 2023-08-13: 60 mg via INTRAVENOUS
  Filled 2023-08-13: qty 2

## 2023-08-13 MED ORDER — TORSEMIDE 20 MG PO TABS
20.0000 mg | ORAL_TABLET | Freq: Every day | ORAL | Status: DC
  Administered 2023-08-13: 20 mg via ORAL
  Filled 2023-08-13 (×2): qty 1

## 2023-08-13 MED ORDER — COLCHICINE 0.6 MG PO TABS: 0.6000 mg | ORAL_TABLET | Freq: Every day | ORAL | Status: DC

## 2023-08-13 MED ORDER — ALLOPURINOL 100 MG PO TABS
100.0000 mg | ORAL_TABLET | Freq: Every day | ORAL | Status: DC
  Administered 2023-08-13 – 2023-08-14 (×2): 100 mg via ORAL
  Filled 2023-08-13 (×2): qty 1

## 2023-08-13 MED ORDER — PANTOPRAZOLE SODIUM 40 MG PO TBEC
40.0000 mg | DELAYED_RELEASE_TABLET | Freq: Every day | ORAL | Status: DC
  Administered 2023-08-13 – 2023-08-14 (×2): 40 mg via ORAL
  Filled 2023-08-13 (×2): qty 1

## 2023-08-13 MED ORDER — PREGABALIN 50 MG PO CAPS
100.0000 mg | ORAL_CAPSULE | Freq: Every day | ORAL | Status: DC
  Administered 2023-08-13 – 2023-08-14 (×2): 100 mg via ORAL
  Filled 2023-08-13 (×2): qty 2

## 2023-08-13 MED ORDER — ACETAMINOPHEN 650 MG RE SUPP: 650.0000 mg | Freq: Four times a day (QID) | RECTAL | Status: DC | PRN

## 2023-08-13 MED ORDER — ASPIRIN 81 MG PO TBEC
81.0000 mg | DELAYED_RELEASE_TABLET | Freq: Every day | ORAL | Status: DC
  Administered 2023-08-13 – 2023-08-14 (×2): 81 mg via ORAL
  Filled 2023-08-13 (×2): qty 1

## 2023-08-13 MED ORDER — ESCITALOPRAM OXALATE 10 MG PO TABS
20.0000 mg | ORAL_TABLET | Freq: Every day | ORAL | Status: DC
  Administered 2023-08-13 – 2023-08-14 (×2): 20 mg via ORAL
  Filled 2023-08-13 (×2): qty 2

## 2023-08-13 MED ORDER — ACETAMINOPHEN 325 MG PO TABS: 650.0000 mg | ORAL_TABLET | Freq: Four times a day (QID) | ORAL | Status: DC | PRN

## 2023-08-13 MED ORDER — COLCHICINE 0.6 MG PO TABS
0.6000 mg | ORAL_TABLET | Freq: Two times a day (BID) | ORAL | Status: DC
  Administered 2023-08-13 – 2023-08-14 (×4): 0.6 mg via ORAL
  Filled 2023-08-13 (×4): qty 1

## 2023-08-13 NOTE — H&P (Signed)
[place]   PATIENT NAME: Betty Luna    MR#:  [id number]  DATE OF BIRTH:  May 09, 1951  DATE OF ADMISSION:  08/13/2023  PRIMARY CARE PHYSICIAN: Ethelda Chick, MD   Patient is coming from: Home  REQUESTING/REFERRING PHYSICIAN: Ward, Layla Maw, DO  CHIEF COMPLAINT:   Chief Complaint  Patient presents with   Hypotension    HISTORY OF PRESENT ILLNESS:  Betty Luna is a 72 y.o. African-American female with medical history significant for CHF, anemia, osteoarthritis, type 2 diabetes mellitus, GERD, gout and hypertension, presented to the ER with acute onset of lightheadedness and dizziness with associated hypotension.  She denied any fever or chills.  No cough or wheezing or dyspnea.  She has been having urinary frequency and urgency without significant dysuria or hematuria or flank pain.  No headache or dizziness or blurred vision.  No paresthesias or focal muscle weakness.  ED Course: When she came to the ED, BP was 96/61 with otherwise normal vitals.  Labs revealed a BUN of 28 creatinine 1.12 with blood glucose of 148, otherwise unremarkable CMP.  High sensitive troponin I was 10 twice.  Lactic acid was 1.5 and procalcitonin 0.22.  CBC was unremarkable.  UA was suspicious for UTI. EKG as reviewed by me :  EKG showed atrial senseds ventricular paced rhythm with a rate of 91. Imaging: Portable chest x-ray showed no acute cardiopulmonary disease.  The patient was given 60 mg of IV Solu-Medrol and a gram of IV Rocephin.  She will be admitted to a medically monitored observation bed for further evaluation and management. PAST MEDICAL HISTORY:   Past Medical History:  Diagnosis Date   Anemia    Arthritis    CHF (congestive heart failure) (HCC)    Depression    Diabetes mellitus without complication (HCC)    GERD (gastroesophageal reflux disease)    Gout    Hypertension     PAST SURGICAL HISTORY:   Past Surgical History:  Procedure Laterality Date    ABDOMINAL HYSTERECTOMY     BIV ICD INSERTION CRT-D N/A 08/06/2023   Procedure: BIV ICD INSERTION CRT-D;  Surgeon: Lanier Prude, MD;  [facility] INVASIVE CV LAB;  Service: Cardiovascular;  Laterality: N/A;   CATARACT EXTRACTION W/PHACO Right 04/16/2017   Procedure: CATARACT EXTRACTION PHACO AND INTRAOCULAR LENS PLACEMENT (IOC);  Surgeon: Galen Manila, MD;  Location: [facility] ORS;  Service: Ophthalmology;  Laterality: Right;  [place] 01:06 AP% 16.5 CDE 11.03 Fluid pack lot # [id number]   CATARACT EXTRACTION W/PHACO Left 10/01/2017   Procedure: CATARACT EXTRACTION PHACO AND INTRAOCULAR LENS PLACEMENT (IOC)-LEFT DIABETIC;  Surgeon: Galen Manila, MD;  Location: [facility] ORS;  Service: Ophthalmology;  Laterality: Left;  [place] 01:05.7 AP% 12.2 CDE 7.42 Fluid pack lot # [id number]   CTR Left    EYE SURGERY Left    RIGHT/LEFT HEART CATH AND CORONARY ANGIOGRAPHY Bilateral 01/01/2022   Procedure: RIGHT/LEFT HEART CATH AND CORONARY ANGIOGRAPHY;  Surgeon: Iran Ouch, MD;  Location: [facility] INVASIVE CV LAB;  Service: Cardiovascular;  Laterality: Bilateral;   TONSILLECTOMY      SOCIAL HISTORY:   Social History   Tobacco Use   Smoking status: Former    Current packs/day: 0.00    Types: Cigarettes    Quit date: 2006    Years since quitting: 18.8   Smokeless tobacco: Current    Types: Snuff  Substance Use Topics   Alcohol use: Yes    Alcohol/week: 12.0 standard drinks of alcohol  Types: 12 Cans of beer per week    Comment: occasonally    FAMILY HISTORY:   Family History  Problem Relation Age of Onset   Breast cancer Sister     DRUG ALLERGIES:   Allergies  Allergen Reactions   Omeprazole Other (See Comments)    CHEST PAIN    Tramadol Nausea And Vomiting   Atorvastatin Other (See Comments)    And another statin too, muscle pain   Codeine Anxiety and Other (See Comments)    REVIEW OF SYSTEMS:   ROS As per history of present illness. All pertinent systems were reviewed  above. Constitutional, HEENT, cardiovascular, respiratory, GI, GU, musculoskeletal, neuro, psychiatric, endocrine, integumentary and hematologic systems were reviewed and are otherwise negative/unremarkable except for positive findings mentioned above in the HPI.   MEDICATIONS AT HOME:   Prior to Admission medications   Medication Sig Start Date End Date Taking? Authorizing Provider  acetaminophen (TYLENOL) 500 MG tablet Take 500 mg by mouth every 6 (six) hours as needed for moderate pain.    [provider]  allopurinol (ZYLOPRIM) 100 MG tablet Take 100 mg by mouth daily.    [provider]  aspirin EC 81 MG tablet Take 1 tablet (81 mg total) by mouth daily. 08/12/23   Lanier Prude, MD  carvedilol (COREG) 25 MG tablet Take 1 tablet (25 mg total) by mouth 2 (two) times daily. 03/15/23 03/09/24  Sabharwal, Aditya, DO  colchicine 0.6 MG tablet Take 1 tablet (0.6 mg total) by mouth 2 (two) times daily. Patient taking differently: Take 0.6 mg by mouth daily. 02/02/23 02/02/24  Minna Antis, MD  dapagliflozin propanediol (FARXIGA) 10 MG TABS tablet Take 10 mg by mouth daily.    [provider]  escitalopram (LEXAPRO) 20 MG tablet Take 20 mg by mouth daily. 01/31/22   [provider]  ferrous sulfate 324 (65 Fe) MG TBEC Take 325 mg by mouth daily with breakfast.    [provider]  losartan (COZAAR) 25 MG tablet Take 1 tablet (25 mg total) by mouth daily. 07/24/23 10/22/23  Sabharwal, Aditya, DO  metFORMIN (GLUCOPHAGE-XR) 500 MG 24 hr tablet Take 1,000 mg by mouth 2 (two) times daily. 03/28/22   [provider]  pantoprazole (PROTONIX) 40 MG tablet Take 40 mg by mouth daily. 03/07/17   [provider]  pregabalin (LYRICA) 100 MG capsule Take 100 mg by mouth daily. 07/24/23   [provider]  spironolactone (ALDACTONE) 25 MG tablet Take 1 tablet (25 mg total) by mouth daily. STOP taking your Amlodipine 02/15/23   Debbe Odea, MD  torsemide (DEMADEX) 20 MG tablet Take 1 tablet by mouth every other day. May take an additional tablet as needed for shortness of breath or swelling Patient taking differently: Take 20 mg by mouth daily. May take an additional tablet as needed for shortness of breath or swelling 07/25/23   Sabharwal, Aditya, DO      VITAL SIGNS:  Blood pressure (!) 148/76, pulse 76, temperature 98.4 F (36.9 C), temperature source Oral, resp. rate 16, height 5\' 2"  (1.575 m), weight 97.5 kg, SpO2 95%.  PHYSICAL EXAMINATION:  Physical Exam  GENERAL:  72 y.o.-year-old African-American female patient lying in the bed with no acute distress.  EYES: Pupils equal, round, reactive to light and accommodation. No scleral icterus. Extraocular muscles intact.  HEENT: Head atraumatic, normocephalic. Oropharynx and nasopharynx clear.  NECK:  Supple, no jugular venous distention. No thyroid enlargement, no tenderness.  LUNGS: Normal breath  sounds bilaterally, no wheezing, rales,rhonchi or crepitation. No use of accessory muscles of respiration.  CARDIOVASCULAR: Regular rate and rhythm, S1, S2 normal. No murmurs, rubs, or gallops.  ABDOMEN: Soft, nondistended, nontender. Bowel sounds present. No organomegaly or mass.  EXTREMITIES: No pedal edema, cyanosis, or clubbing.  NEUROLOGIC: Cranial nerves II through XII are intact. Muscle strength 5/5 in all extremities. Sensation intact. Gait not checked.  PSYCHIATRIC: The patient is alert and oriented x 3.  Normal affect and good eye contact. SKIN: No obvious rash, lesion, or ulcer.   LABORATORY PANEL:   CBC Recent Labs  Lab 08/13/23 0245  WBC 9.0  HGB 11.7*  HCT 37.2  PLT 267   ------------------------------------------------------------------------------------------------------------------  Chemistries  Recent Labs  Lab 08/12/23 1953 08/13/23 0245  NA 137 135  K 3.8 3.8  CL 102 99  CO2 23 24  GLUCOSE 148* 127*  BUN 28* 31*  CREATININE 1.12*  1.09*  CALCIUM 9.3 9.3  AST 41  --   ALT 34  --   ALKPHOS 105  --   BILITOT 0.7  --    ------------------------------------------------------------------------------------------------------------------  Cardiac Enzymes No results for input(s): "TROPONINI" in the last 168 hours. ------------------------------------------------------------------------------------------------------------------  RADIOLOGY:  DG Chest 1 View  Result Date: 08/12/2023 CLINICAL DATA:  Chest pain EXAM: PORTABLE CHEST 1 VIEW COMPARISON:  08/06/2023 FINDINGS: Cardiac shadow is stable. Aortic calcifications are noted. Defibrillator is again noted and stable. Lungs are clear bilaterally. No bony abnormality is seen. IMPRESSION: No acute abnormality noted. Electronically Signed   By: Alcide Clever M.D.   On: 08/12/2023 22:49      IMPRESSION AND PLAN:  Assessment and Plan: * Acute lower UTI - The patient will be admitted to a medical telemetry observation bed. - We will continue hydration with IV normal saline. - Will continue antibiotic therapy with IV Rocephin. - Will follow urine and blood cultures.  Hypotension - This could be related to volume depletion and dehydration. - We will hold off antihypertensives. - Will hydrate with IV normal saline.  Type 2 diabetes mellitus with peripheral neuropathy (HCC) - We will place the patient on supplemental coverage with NovoLog. - We will hold off metformin. - We will continue Lyrica and [place].  Depression We will continue Lexapro  Gout - We will continue  allopurinol and colchicine.      DVT prophylaxis: Lovenox . Advanced Care Planning:  Code Status: full code  Family Communication:  The plan of care was discussed in details with the patient (and family). I answered all questions. The patient agreed to proceed with the above mentioned plan. Further management will depend upon hospital course. Disposition Plan: Back to previous home  environment Consults called: none.  All the records are reviewed and case discussed with ED provider.  Status is: Observation  I certify that at the time of admission, it is my clinical judgment that the patient will require  hospital care extending LESS than 2 midnights.                            Dispo: The patient is from: Home              Anticipated d/c is to: Home              Patient currently is not medically stable to d/c.              Difficult to place patient: No  Jenevie Casstevens A  Guerline Happ M.D on 08/13/2023 at 6:52 AM  [facility]   From 7 PM-7 AM, contact night-coverage [email or web address]  CC: Primary care physician; Ethelda Chick, MD

## 2023-08-13 NOTE — Assessment & Plan Note (Signed)
Continue Lexapro

## 2023-08-13 NOTE — Progress Notes (Signed)
CODE SEPSIS - PHARMACY COMMUNICATION  **Broad Spectrum Antibiotics should be administered within 1 hour of Sepsis diagnosis**  Time Code Sepsis Called/Page Received:  10/22 @ 0143  Antibiotics Ordered: ceftriaxone 1 gm IV X 1   Time of 1st antibiotic administration: none given as of 10/22 @ 0300 b/c unable to obtain IV access ,  2 RN's attempted to get IV started but unsuccessful   Additional action taken by pharmacy: message RN around [phone] , RN stated unable to get IV access   If necessary, Name of Provider/Nurse Contacted:  Irven Baltimore, RN     Princella Ion D ,PharmD Clinical Pharmacist  08/13/2023  2:58 AM

## 2023-08-13 NOTE — Progress Notes (Signed)
Anticoagulation monitoring(Lovenox):  72 yo female ordered Lovenox 40 mg Q24h    Filed Weights   08/12/23 1949  Weight: 97.5 kg (215 lb)   BMI 39.3    Lab Results  Component Value Date   CREATININE 1.12 (H) 08/12/2023   CREATININE 1.33 (H) 07/24/2023   CREATININE 1.19 (H) 07/18/2023   Estimated Creatinine Clearance: 50.3 mL/min (A) (by C-G formula based on SCr of 1.12 mg/dL (H)). Hemoglobin & Hematocrit     Component Value Date/Time   HGB 12.4 08/12/2023 1953   HGB 11.8 (L) 09/02/2014 0829   HCT 38.3 08/12/2023 1953   HCT 37.6 09/02/2014 0829     Per Protocol for Patient with estCrcl > 30 ml/min and BMI > 30, will transition to Lovenox 50 mg Q24h.

## 2023-08-13 NOTE — Assessment & Plan Note (Addendum)
Urine culture growing Morganella sensitivities pending.  Received 2 doses of Rocephin here will finish up a course of Keflex upon discharge.

## 2023-08-13 NOTE — Assessment & Plan Note (Signed)
Fluid bolus given today.  Patient's blood pressure did drop down but not enough to cause symptoms.  Continue to hold Coreg and Aldactone.  Held torsemide today can go back on her usual dose as outpatient.  Before restarting blood pressure medications must check orthostatic vital signs.

## 2023-08-13 NOTE — Assessment & Plan Note (Addendum)
-   We will continue allopurinol and colchicine.

## 2023-08-13 NOTE — ED Notes (Signed)
2nd nurse unable to get IV access after 2 attempts.

## 2023-08-13 NOTE — Sepsis Progress Note (Signed)
Following for sepsis monitoring ?

## 2023-08-13 NOTE — ED Notes (Signed)
Attempt x 2 at IV failed. Another RN at bedside to attempt.

## 2023-08-13 NOTE — Plan of Care (Signed)

## 2023-08-13 NOTE — ED Notes (Signed)
RN notified pt of room assignment and that it was a semi private room. Pt refused room assignment at this time.

## 2023-08-13 NOTE — ED Notes (Signed)
RN notified pt that room was ready and clean. Pt sts okay I will go up.

## 2023-08-13 NOTE — ED Provider Notes (Signed)
[facility] Provider Note    Event Date/Time   First MD Initiated Contact with Patient 08/13/23 0128     (approximate)   History   Hypotension   HPI  Betty Luna is a 72 y.o. female with history of CHF with recent pacemaker placement, hypertension, diabetes who presents to the emergency department with lightheadedness today.  Found to be hypotensive.  Reports she has been taking her blood pressure medications.  Denies that they have been changed recently.  Denies chest pain.  Has chronic shortness of breath which is unchanged.  No fevers.  No vomiting or diarrhea.  Her blood pressures normally run in the 120s to 140s systolic.   History provided by patient, family.    Past Medical History:  Diagnosis Date   Anemia    Arthritis    CHF (congestive heart failure) (HCC)    Depression    Diabetes mellitus without complication (HCC)    GERD (gastroesophageal reflux disease)    Gout    Hypertension     Past Surgical History:  Procedure Laterality Date   ABDOMINAL HYSTERECTOMY     BIV ICD INSERTION CRT-D N/A 08/06/2023   Procedure: BIV ICD INSERTION CRT-D;  Surgeon: Lanier Prude, MD;  Location: [facility] INVASIVE CV LAB;  Service: Cardiovascular;  Laterality: N/A;   CATARACT EXTRACTION W/PHACO Right 04/16/2017   Procedure: CATARACT EXTRACTION PHACO AND INTRAOCULAR LENS PLACEMENT (IOC);  Surgeon: Galen Manila, MD;  Location: [facility] ORS;  Service: Ophthalmology;  Laterality: Right;  [place] 01:06 AP% 16.5 CDE 11.03 Fluid pack lot # [id number]   CATARACT EXTRACTION W/PHACO Left 10/01/2017   Procedure: CATARACT EXTRACTION PHACO AND INTRAOCULAR LENS PLACEMENT (IOC)-LEFT DIABETIC;  Surgeon: Galen Manila, MD;  Location: [facility] ORS;  Service: Ophthalmology;  Laterality: Left;  [place] 01:05.7 AP% 12.2 CDE 7.42 Fluid pack lot # [id number]   CTR Left    EYE SURGERY Left    RIGHT/LEFT HEART CATH AND CORONARY ANGIOGRAPHY Bilateral 01/01/2022   Procedure:  RIGHT/LEFT HEART CATH AND CORONARY ANGIOGRAPHY;  Surgeon: Iran Ouch, MD;  Location: [facility] INVASIVE CV LAB;  Service: Cardiovascular;  Laterality: Bilateral;   TONSILLECTOMY      MEDICATIONS:  Prior to Admission medications   Medication Sig Start Date End Date Taking? Authorizing Provider  acetaminophen (TYLENOL) 500 MG tablet Take 500 mg by mouth every 6 (six) hours as needed for moderate pain.    [provider]  allopurinol (ZYLOPRIM) 100 MG tablet Take 100 mg by mouth daily.    [provider]  aspirin EC 81 MG tablet Take 1 tablet (81 mg total) by mouth daily. 08/12/23   Lanier Prude, MD  carvedilol (COREG) 25 MG tablet Take 1 tablet (25 mg total) by mouth 2 (two) times daily. 03/15/23 03/09/24  Sabharwal, Aditya, DO  colchicine 0.6 MG tablet Take 1 tablet (0.6 mg total) by mouth 2 (two) times daily. Patient taking differently: Take 0.6 mg by mouth daily. 02/02/23 02/02/24  Minna Antis, MD  dapagliflozin propanediol (FARXIGA) 10 MG TABS tablet Take 10 mg by mouth daily.    [provider]  escitalopram (LEXAPRO) 20 MG tablet Take 20 mg by mouth daily. 01/31/22   [provider]  ferrous sulfate 324 (65 Fe) MG TBEC Take 325 mg by mouth daily with breakfast.    [provider]  losartan (COZAAR) 25 MG tablet Take 1 tablet (25 mg total) by mouth daily. 07/24/23 10/22/23  Sabharwal, Aditya, DO  metFORMIN (GLUCOPHAGE-XR) 500 MG  24 hr tablet Take 1,000 mg by mouth 2 (two) times daily. 03/28/22   [provider]  pantoprazole (PROTONIX) 40 MG tablet Take 40 mg by mouth daily. 03/07/17   [provider]  pregabalin (LYRICA) 100 MG capsule Take 100 mg by mouth daily. 07/24/23   [provider]  spironolactone (ALDACTONE) 25 MG tablet Take 1 tablet (25 mg total) by mouth daily. STOP taking your Amlodipine 02/15/23   Debbe Odea, MD  torsemide (DEMADEX) 20 MG tablet Take 1 tablet by mouth every other day. May take  an additional tablet as needed for shortness of breath or swelling Patient taking differently: Take 20 mg by mouth daily. May take an additional tablet as needed for shortness of breath or swelling 07/25/23   Dorthula Nettles, DO    Physical Exam   Triage Vital Signs: ED Triage Vitals  Encounter Vitals Group     BP 08/12/23 1948 96/61     Systolic BP Percentile --      Diastolic BP Percentile --      Pulse Rate 08/12/23 1948 91     Resp 08/12/23 1948 20     Temp 08/12/23 1948 98.2 F (36.8 C)     Temp Source 08/12/23 1948 Oral     SpO2 08/12/23 1948 97 %     Weight 08/12/23 1949 215 lb (97.5 kg)     Height 08/12/23 1949 5\' 2"  (1.575 m)     Head Circumference --      Peak Flow --      Pain Score 08/12/23 1949 7     Pain Loc --      Pain Education --      Exclude from Growth Chart --     Most recent vital signs: Vitals:   08/13/23 0235 08/13/23 0332  BP:  121/68  Pulse: 87 82  Resp: 18   Temp:    SpO2: 99% 100%    CONSTITUTIONAL: Alert, responds appropriately to questions.  Elderly, in no distress HEAD: Normocephalic, atraumatic EYES: Conjunctivae clear, pupils appear equal, sclera nonicteric ENT: normal nose; moist mucous membranes NECK: Supple, normal ROM CARD: RRR; S1 and S2 appreciated RESP: Normal chest excursion without splinting or tachypnea; breath sounds clear and equal bilaterally; no wheezes, no rhonchi, no rales, no hypoxia or respiratory distress, speaking full sentences ABD/GI: Non-distended; soft, non-tender, no rebound, no guarding, no peritoneal signs BACK: The back appears normal EXT: Normal ROM in all joints; no deformity noted, no edema SKIN: Normal color for age and race; warm; no rash on exposed skin NEURO: Moves all extremities equally, normal speech PSYCH: The patient's mood and manner are appropriate.   ED Results / Procedures / Treatments   LABS: (all labs ordered are listed, but only abnormal results are displayed) Labs Reviewed  CBC  - Abnormal; Notable for the following components:      Result Value   RBC 5.13 (*)    MCV 74.7 (*)    MCH 24.2 (*)    RDW 16.3 (*)    All other components within normal limits  COMPREHENSIVE METABOLIC PANEL - Abnormal; Notable for the following components:   Glucose, Bld 148 (*)    BUN 28 (*)    Creatinine, Ser 1.12 (*)    GFR, Estimated 53 (*)    All other components within normal limits  URINALYSIS, ROUTINE W REFLEX MICROSCOPIC - Abnormal; Notable for the following components:   Color, Urine YELLOW (*)    APPearance HAZY (*)  Glucose, UA 150 (*)    Leukocytes,Ua MODERATE (*)    Bacteria, UA FEW (*)    All other components within normal limits  BASIC METABOLIC PANEL - Abnormal; Notable for the following components:   Glucose, Bld 127 (*)    BUN 31 (*)    Creatinine, Ser 1.09 (*)    GFR, Estimated 54 (*)    All other components within normal limits  CBC - Abnormal; Notable for the following components:   Hemoglobin 11.7 (*)    MCV 74.7 (*)    MCH 23.5 (*)    RDW 16.2 (*)    All other components within normal limits  URINE CULTURE  CULTURE, BLOOD (ROUTINE X 2)  CULTURE, BLOOD (ROUTINE X 2)  LACTIC ACID, PLASMA  PROCALCITONIN  LACTIC ACID, PLASMA  TROPONIN I (HIGH SENSITIVITY)  TROPONIN I (HIGH SENSITIVITY)     EKG:  EKG Interpretation Date/Time:  Monday August 12 2023 19:56:58 EDT Ventricular Rate:  91 PR Interval:  160 QRS Duration:  142 QT Interval:  408 QTC Calculation: 501 R Axis:   3  Text Interpretation: Atrial-sensed ventricular-paced rhythm Abnormal ECG When compared with ECG of 06-Aug-2023 13:46, Vent. rate has increased BY   8 BPM Confirmed by Rochele Raring [phone]) on 08/13/2023 1:37:16 AM         RADIOLOGY: My personal review and interpretation of imaging: Chest x-ray clear.  I have personally reviewed all radiology reports.   DG Chest 1 View  Result Date: 08/12/2023 CLINICAL DATA:  Chest pain EXAM: PORTABLE CHEST 1 VIEW COMPARISON:   08/06/2023 FINDINGS: Cardiac shadow is stable. Aortic calcifications are noted. Defibrillator is again noted and stable. Lungs are clear bilaterally. No bony abnormality is seen. IMPRESSION: No acute abnormality noted. Electronically Signed   By: Alcide Clever M.D.   On: 08/12/2023 22:49     PROCEDURES:  Critical Care performed: Yes, see critical care procedure note(s)   CRITICAL CARE Performed by: Baxter Hire Collins Dimaria   Total critical care time: 30 minutes  Critical care time was exclusive of separately billable procedures and treating other patients.  Critical care was necessary to treat or prevent imminent or life-threatening deterioration.  Critical care was time spent personally by me on the following activities: development of treatment plan with patient and/or surrogate as well as nursing, discussions with consultants, evaluation of patient's response to treatment, examination of patient, obtaining history from patient or surrogate, ordering and performing treatments and interventions, ordering and review of laboratory studies, ordering and review of radiographic studies, pulse oximetry and re-evaluation of patient's condition.   Marland Kitchen1-3 Lead EKG Interpretation  Performed by: Qais Jowers, Layla Maw, DO Authorized by: Tynan Boesel, Layla Maw, DO     Interpretation: normal     ECG rate:  91   ECG rate assessment: normal     Rhythm: sinus rhythm     Ectopy: none     Conduction: normal       IMPRESSION / MDM / ASSESSMENT AND PLAN / ED COURSE  I reviewed the triage vital signs and the nursing notes.    Patient here with hypotension.  Pressures in the 90s initially.  Currently blood pressure in the 120s/60s.  The patient is on the cardiac monitor to evaluate for evidence of arrhythmia and/or significant heart rate changes.   DIFFERENTIAL DIAGNOSIS (includes but not limited to):   Dehydration, sepsis, cardiogenic shock, anemia   Patient's presentation is most consistent with acute presentation  with potential threat to life or bodily function.  PLAN: Will give gentle IV hydration.  Workup initiated from triage.  No leukocytosis, normal hemoglobin.  Creatinine appears to be at her baseline.  Troponin x 2 was negative.  She does appear to have a UTI which could be the cause of her symptoms.  Blood pressure has improved here without intervention.  Will obtain lactic, procalcitonin, urine and blood cultures.  Will give IV antibiotics for UTI.  Chest x-ray reviewed and interpreted by myself and the radiologist and shows no acute abnormality.  Will discuss with hospitalist for admission.   MEDICATIONS GIVEN IN ED: Medications  lactated ringers bolus 500 mL (500 mLs Intravenous New Bag/Given 08/13/23 0330)  allopurinol (ZYLOPRIM) tablet 100 mg (has no administration in time range)  aspirin EC tablet 81 mg (has no administration in time range)  torsemide (DEMADEX) tablet 20 mg (has no administration in time range)  escitalopram (LEXAPRO) tablet 20 mg (has no administration in time range)  dapagliflozin propanediol (FARXIGA) tablet 10 mg (has no administration in time range)  pantoprazole (PROTONIX) EC tablet 40 mg (has no administration in time range)  ferrous sulfate tablet 325 mg (has no administration in time range)  pregabalin (LYRICA) capsule 100 mg (has no administration in time range)  enoxaparin (LOVENOX) injection 50 mg (has no administration in time range)  0.9 %  sodium chloride infusion (has no administration in time range)  acetaminophen (TYLENOL) tablet 650 mg (has no administration in time range)    Or  acetaminophen (TYLENOL) suppository 650 mg (has no administration in time range)  traZODone (DESYREL) tablet 25 mg (has no administration in time range)  ondansetron (ZOFRAN) tablet 4 mg (has no administration in time range)    Or  ondansetron (ZOFRAN) injection 4 mg (has no administration in time range)  magnesium hydroxide (MILK OF MAGNESIA) suspension 30 mL (has no  administration in time range)  colchicine tablet 0.6 mg (0.6 mg Oral Given 08/13/23 0330)  cefTRIAXone (ROCEPHIN) 1 g in sodium chloride 0.9 % 100 mL IVPB (has no administration in time range)  cefTRIAXone (ROCEPHIN) 1 g in sodium chloride 0.9 % 100 mL IVPB (0 g Intravenous Stopped 08/13/23 0328)  methylPREDNISolone sodium succinate (SOLU-MEDROL) 125 mg/2 mL injection 60 mg (60 mg Intravenous Given 08/13/23 0259)     ED COURSE:  BPs continue to be back to baseline and stable.   CONSULTS:  Consulted and discussed patient's case with hospitalist, Dr. Arville Care.  I have recommended admission and consulting physician agrees and will place admission orders.  Patient (and family if present) agree with this plan.   I reviewed all nursing notes, vitals, pertinent previous records.  All labs, EKGs, imaging ordered have been independently reviewed and interpreted by myself.    OUTSIDE RECORDS REVIEWED: Reviewed recent cardiology notes for pacemaker placement.       FINAL CLINICAL IMPRESSION(S) / ED DIAGNOSES   Final diagnoses:  Lightheadedness  Acute UTI     Rx / DC Orders   ED Discharge Orders     None        Note:  This document was prepared using Dragon voice recognition software and may include unintentional dictation errors.   Suede Greenawalt, Layla Maw, DO 08/13/23 [phone]

## 2023-08-13 NOTE — ED Notes (Signed)
Pt given a boxed lunch.

## 2023-08-13 NOTE — Hospital Course (Signed)
This is a nonbillable note.  Patient seen and examined, Betty Luna is a 72 y.o. African-American female with medical history significant for CHF, anemia, osteoarthritis, type 2 diabetes mellitus, GERD, gout and hypertension, presented to the ER with acute onset of lightheadedness and dizziness with associated hypotension.  She is treated with IV fluid bolus, blood pressure is better.  She was also placed on Rocephin for UTI, urine culture pending. Will discontinue IV fluids.

## 2023-08-13 NOTE — Assessment & Plan Note (Addendum)
Can go back on metformin as outpatient. Continue Lyrica and [place].

## 2023-08-13 NOTE — ED Notes (Signed)
Pt b/p cuff reapplied, and hooked up to telemetry.  NAD. Family at bedside, call light within reach, bed in lowest position.

## 2023-08-13 NOTE — Progress Notes (Signed)
This is a nonbillable note.  Patient seen and examined, Betty Luna is a 72 y.o. African-American female with medical history significant for CHF, anemia, osteoarthritis, type 2 diabetes mellitus, GERD, gout and hypertension, presented to the ER with acute onset of lightheadedness and dizziness with associated hypotension.  She is treated with IV fluid bolus, blood pressure is better.  She was also placed on Rocephin for UTI, urine culture pending. Will discontinue IV fluids.

## 2023-08-14 DIAGNOSIS — N1831 Chronic kidney disease, stage 3a: Secondary | ICD-10-CM

## 2023-08-14 DIAGNOSIS — F3289 Other specified depressive episodes: Secondary | ICD-10-CM | POA: Diagnosis not present

## 2023-08-14 DIAGNOSIS — I5022 Chronic systolic (congestive) heart failure: Secondary | ICD-10-CM

## 2023-08-14 DIAGNOSIS — N390 Urinary tract infection, site not specified: Secondary | ICD-10-CM | POA: Diagnosis not present

## 2023-08-14 DIAGNOSIS — I951 Orthostatic hypotension: Secondary | ICD-10-CM | POA: Diagnosis not present

## 2023-08-14 DIAGNOSIS — E1142 Type 2 diabetes mellitus with diabetic polyneuropathy: Secondary | ICD-10-CM | POA: Diagnosis not present

## 2023-08-14 MED ORDER — COLCHICINE 0.6 MG PO TABS: 0.6000 mg | ORAL_TABLET | Freq: Every day | ORAL | Status: AC

## 2023-08-14 MED ORDER — SODIUM CHLORIDE 0.9 % IV BOLUS
250.0000 mL | Freq: Once | INTRAVENOUS | Status: AC
  Administered 2023-08-14: 250 mL via INTRAVENOUS

## 2023-08-14 MED ORDER — CEPHALEXIN 500 MG PO CAPS: 500.0000 mg | ORAL_CAPSULE | Freq: Three times a day (TID) | ORAL | 0 refills | Status: AC

## 2023-08-14 MED ORDER — TORSEMIDE 20 MG PO TABS: 20.0000 mg | ORAL_TABLET | Freq: Every day | ORAL | Status: DC

## 2023-08-14 MED ORDER — CARVEDILOL 6.25 MG PO TABS
12.5000 mg | ORAL_TABLET | Freq: Two times a day (BID) | ORAL | Status: DC
  Filled 2023-08-14: qty 2

## 2023-08-14 MED ORDER — SPIRONOLACTONE 25 MG PO TABS
25.0000 mg | ORAL_TABLET | Freq: Every day | ORAL | Status: DC
  Filled 2023-08-14: qty 1

## 2023-08-14 MED ORDER — TORSEMIDE 20 MG PO TABS: 20.0000 mg | ORAL_TABLET | ORAL | Status: AC

## 2023-08-14 NOTE — Assessment & Plan Note (Signed)
No signs of heart failure currently.  Need to hold antihypertensive medications secondary to orthostatic hypotension.  Patient will go back on her torsemide and Jardiance as outpatient.

## 2023-08-14 NOTE — Assessment & Plan Note (Signed)
Creatinine 1.09 with a GFR 54 upon discharge.

## 2023-08-14 NOTE — Discharge Summary (Signed)
Physician Discharge Summary   Patient: Betty Luna MRN: [id number] DOB: April 04, 1951  Admit date:     08/13/2023  Discharge date: 08/14/23  Discharge Physician: Alford Highland   PCP: Ethelda Chick, MD   Recommendations at discharge:   Follow-up PCP 5 days  Discharge Diagnoses: Principal Problem:   Orthostatic hypotension Active Problems:   Acute lower UTI   Type 2 diabetes mellitus with peripheral neuropathy (HCC)   Gout   Depression   Chronic systolic CHF (congestive heart failure) (HCC)   CKD stage 3a, GFR 45-59 ml/min [facility])    Hospital Course: 72 y.o. African-American female with medical history significant for CHF, anemia, osteoarthritis, type 2 diabetes mellitus, GERD, gout and hypertension, presented to the ER with acute onset of lightheadedness and dizziness with associated hypotension.  She is treated with IV fluid bolus, blood pressure is better.  She was also placed on Rocephin for UTI, urine culture pending.  10/23.  Patient feeling better.  Was slightly orthostatic.  Given a fluid bolus.  Continue to hold Coreg, Aldactone.  Can go back on her every other day water pill at home.  Assessment and Plan: * Orthostatic hypotension Fluid bolus given today.  Patient's blood pressure did drop down but not enough to cause symptoms.  Continue to hold Coreg and Aldactone.  Held torsemide today can go back on her usual dose as outpatient.  Before restarting blood pressure medications must check orthostatic vital signs.  Acute lower UTI Urine culture growing Morganella sensitivities pending.  Received 2 doses of Rocephin here will finish up a course of Keflex upon discharge.  Type 2 diabetes mellitus with peripheral neuropathy (HCC) Can go back on metformin as outpatient. Continue Lyrica and [place].  Gout Continue allopurinol and colchicine.  Depression Continue Lexapro  CKD stage 3a, GFR 45-59 ml/min (HCC) Creatinine 1.09 with a GFR 54 upon  discharge.  Chronic systolic CHF (congestive heart failure) (HCC) No signs of heart failure currently.  Need to hold antihypertensive medications secondary to orthostatic hypotension.  Patient will go back on her torsemide and Jardiance as outpatient.         Consultants: None Procedures performed: None Disposition: Home Diet recommendation:  Cardiac diet DISCHARGE MEDICATION: Allergies as of 08/14/2023       Reactions   Omeprazole Other (See Comments)   CHEST PAIN    Tramadol Nausea And Vomiting   Atorvastatin Other (See Comments)   And another statin too, muscle pain   Codeine Anxiety, Other (See Comments)        Medication List     STOP taking these medications    carvedilol 25 MG tablet Commonly known as: COREG   losartan 25 MG tablet Commonly known as: COZAAR   spironolactone 25 MG tablet Commonly known as: ALDACTONE       TAKE these medications    acetaminophen 500 MG tablet Commonly known as: TYLENOL Take 500 mg by mouth every 6 (six) hours as needed for moderate pain.   allopurinol 100 MG tablet Commonly known as: ZYLOPRIM Take 100 mg by mouth daily.   aspirin EC 81 MG tablet Take 1 tablet (81 mg total) by mouth daily.   cephALEXin 500 MG capsule Commonly known as: KEFLEX Take 1 capsule (500 mg total) by mouth 3 (three) times daily for 3 days.   colchicine 0.6 MG tablet Take 1 tablet (0.6 mg total) by mouth daily.   escitalopram 20 MG tablet Commonly known as: LEXAPRO Take 20 mg by mouth daily.  Farxiga 10 MG Tabs tablet Generic drug: dapagliflozin propanediol Take 10 mg by mouth daily.   ferrous sulfate 324 (65 Fe) MG Tbec Take 325 mg by mouth daily with breakfast.   metFORMIN 500 MG 24 hr tablet Commonly known as: GLUCOPHAGE-XR Take 1,000 mg by mouth 2 (two) times daily.   pantoprazole 40 MG tablet Commonly known as: PROTONIX Take 40 mg by mouth daily.   pregabalin 100 MG capsule Commonly known as: LYRICA Take 100 mg  by mouth daily.   torsemide 20 MG tablet Commonly known as: DEMADEX Take 1 tablet (20 mg total) by mouth every other day. May take an additional tablet as needed for shortness of breath or swelling What changed:  how much to take how to take this when to take this additional instructions        Follow-up Information     Ethelda Chick, MD. Go in 5 day(s).   Specialty: Obstetrics and Gynecology Why: office will call patient about appt. Contact information: [address] [place] [address] [phone]                Discharge Exam: Ceasar Mons Weights   08/12/23 1949 08/13/23 1524  Weight: 97.5 kg 91.4 kg   Physical Exam HENT:     Head: Normocephalic.     Mouth/Throat:     Pharynx: No oropharyngeal exudate.  Eyes:     General: Lids are normal.     Conjunctiva/sclera: Conjunctivae normal.  Cardiovascular:     Rate and Rhythm: Normal rate and regular rhythm.     Heart sounds: Normal heart sounds, S1 normal and S2 normal.  Pulmonary:     Breath sounds: No decreased breath sounds, wheezing, rhonchi or rales.  Abdominal:     Palpations: Abdomen is soft.     Tenderness: There is no abdominal tenderness.  Musculoskeletal:     Right lower leg: No swelling.     Left lower leg: No swelling.  Skin:    General: Skin is warm.     Findings: No rash.  Neurological:     Mental Status: She is alert and oriented to person, place, and time.      Condition at discharge: serious  The results of significant diagnostics from this hospitalization (including imaging, microbiology, ancillary and laboratory) are listed below for reference.   Imaging Studies: DG Chest 1 View  Result Date: 08/12/2023 CLINICAL DATA:  Chest pain EXAM: PORTABLE CHEST 1 VIEW COMPARISON:  08/06/2023 FINDINGS: Cardiac shadow is stable. Aortic calcifications are noted. Defibrillator is again noted and stable. Lungs are clear bilaterally. No bony abnormality is seen. IMPRESSION: No acute abnormality  noted. Electronically Signed   By: Alcide Clever M.D.   On: 08/12/2023 22:49   DG Chest 2 View  Result Date: 08/06/2023 CLINICAL DATA:  Defer burr later EXAM: CHEST - 2 VIEW COMPARISON:  07/18/2023 FINDINGS: Interim placement of left-sided pacing device with right atrial and 2 right ventricular leads. Probable subsegmental atelectasis at the left base. Normal cardiac size with aortic atherosclerosis. No visible pneumothorax. IMPRESSION: Interim placement of left-sided pacing device. No visible pneumothorax. Electronically Signed   By: Jasmine Pang M.D.   On: 08/06/2023 15:48   EP PPM/ICD IMPLANT  Result Date: 08/06/2023 CONCLUSIONS: 1. Successful implantation of a [employer] Jude CRT-D with left bundle area lead for primary prevention in a patient with chronic systolic heart failure 2. Occlusive venogram of the coronary sinus showed no acceptable branches for quadripolar lead placement 3. No early apparent  complications. 4. Hold antiplatelet therapy for 5 days   DG Chest Port 1 View  Result Date: 07/18/2023 CLINICAL DATA:  Chest pain and dizziness EXAM: PORTABLE CHEST 1 VIEW COMPARISON:  Radiograph 12/10/2021 FINDINGS: Stable cardiomediastinal silhouette. Aortic atherosclerotic calcification. Linear atelectasis or scarring in the left lower lung. Otherwise no focal consolidation, pleural effusion, or pneumothorax. No displaced rib fractures. Remote left rib fractures. IMPRESSION: No acute cardiopulmonary disease. Electronically Signed   By: Minerva Fester M.D.   On: 07/18/2023 21:43    Microbiology: Results for orders placed or performed during the hospital encounter of 08/13/23  Urine Culture     Status: Abnormal (Preliminary result)   Collection Time: 08/12/23 10:39 PM   Specimen: Urine, Clean Catch  Result Value Ref Range Status   Specimen Description   Final    URINE, CLEAN CATCH Performed at [facility], [address] [place], [place] [address]    Special Requests   Final     NONE Performed at [facility], [address] [place], [place] [address]    Culture (A)  Final    60,000 COLONIES/mL MORGANELLA MORGANII SUSCEPTIBILITIES TO FOLLOW Performed at [facility], [address] [place], [place] [address]    Report Status PENDING  Incomplete  Blood culture (routine x 2)     Status: None (Preliminary result)   Collection Time: 08/13/23  2:45 AM   Specimen: BLOOD  Result Value Ref Range Status   Specimen Description BLOOD  RFA  Final   Special Requests   Final    BOTTLES DRAWN AEROBIC AND ANAEROBIC Blood Culture adequate volume   Culture   Final    NO GROWTH 1 DAY Performed at [facility], [address], [place], [place] [address]    Report Status PENDING  Incomplete  Blood culture (routine x 2)     Status: None (Preliminary result)   Collection Time: 08/13/23  3:30 PM   Specimen: BLOOD  Result Value Ref Range Status   Specimen Description BLOOD BLOOD RIGHT HAND  Final   Special Requests   Final    BOTTLES DRAWN AEROBIC AND ANAEROBIC Blood Culture results may not be optimal due to an inadequate volume of blood received in culture bottles   Culture   Final    NO GROWTH < 12 HOURS Performed at [facility], [address] [place], [place] [address]    Report Status PENDING  Incomplete    Labs: CBC: Recent Labs  Lab 08/12/23 1953 08/13/23 0245  WBC 7.7 9.0  HGB 12.4 11.7*  HCT 38.3 37.2  MCV 74.7* 74.7*  PLT 281 267   Basic Metabolic Panel: Recent Labs  Lab 08/12/23 1953 08/13/23 0245  NA 137 135  K 3.8 3.8  CL 102 99  CO2 23 24  GLUCOSE 148* 127*  BUN 28* 31*  CREATININE 1.12* 1.09*  CALCIUM 9.3 9.3   Liver Function Tests: Recent Labs  Lab 08/12/23 1953  AST 41  ALT 34  ALKPHOS 105  BILITOT 0.7  PROT 7.6  ALBUMIN 3.5   CBG: No results for input(s): "GLUCAP" in the last 168 hours.  Discharge time spent: greater than 30 minutes.  Signed: Alford Highland, MD [facility] 08/14/2023

## 2023-08-16 LAB — URINE CULTURE: Culture: 60000 — AB

## 2023-08-16 NOTE — Progress Notes (Signed)
Cipro 500 mg po bid for 3 days called into pharmacy and spoke with patient earlier.

## 2023-08-18 LAB — CULTURE, BLOOD (ROUTINE X 2)
Culture: NO GROWTH
Culture: NO GROWTH
Special Requests: ADEQUATE

## 2023-08-21 ENCOUNTER — Ambulatory Visit: Payer: 59 | Attending: Cardiology

## 2023-08-21 DIAGNOSIS — I428 Other cardiomyopathies: Secondary | ICD-10-CM | POA: Diagnosis not present

## 2023-08-21 DIAGNOSIS — I5022 Chronic systolic (congestive) heart failure: Secondary | ICD-10-CM | POA: Diagnosis not present

## 2023-08-21 LAB — CUP PACEART INCLINIC DEVICE CHECK
Battery Remaining Longevity: 91 mo
Brady Statistic RA Percent Paced: 0.04 %
Brady Statistic RV Percent Paced: 99.27 %
Date Time Interrogation Session: 20241030124131
HighPow Impedance: 67.5 Ohm
Implantable Lead Connection Status: 753985
Implantable Lead Connection Status: 753985
Implantable Lead Connection Status: 753985
Implantable Lead Implant Date: 20241015
Implantable Lead Implant Date: 20241015
Implantable Lead Implant Date: 20241015
Implantable Lead Location: 753859
Implantable Lead Location: 753860
Implantable Lead Location: 753860
Implantable Pulse Generator Implant Date: 20241015
Lead Channel Impedance Value: 412.5 Ohm
Lead Channel Impedance Value: 450 Ohm
Lead Channel Impedance Value: 450 Ohm
Lead Channel Pacing Threshold Amplitude: 0.75 V
Lead Channel Pacing Threshold Amplitude: 0.75 V
Lead Channel Pacing Threshold Amplitude: 0.75 V
Lead Channel Pacing Threshold Amplitude: 0.75 V
Lead Channel Pacing Threshold Amplitude: 4.75 V
Lead Channel Pacing Threshold Amplitude: 4.75 V
Lead Channel Pacing Threshold Pulse Width: 0.05 ms
Lead Channel Pacing Threshold Pulse Width: 0.05 ms
Lead Channel Pacing Threshold Pulse Width: 0.5 ms
Lead Channel Pacing Threshold Pulse Width: 0.5 ms
Lead Channel Pacing Threshold Pulse Width: 0.5 ms
Lead Channel Pacing Threshold Pulse Width: 0.5 ms
Lead Channel Sensing Intrinsic Amplitude: 11.4 mV
Lead Channel Sensing Intrinsic Amplitude: 2.2 mV
Lead Channel Setting Pacing Amplitude: 0.25 V
Lead Channel Setting Pacing Amplitude: 1.625
Lead Channel Setting Pacing Amplitude: 2 V
Lead Channel Setting Pacing Pulse Width: 0.05 ms
Lead Channel Setting Pacing Pulse Width: 0.5 ms
Lead Channel Setting Sensing Sensitivity: 0.5 mV
Pulse Gen Serial Number: 5575132
Zone Setting Status: 755011

## 2023-08-21 NOTE — Progress Notes (Signed)

## 2023-08-21 NOTE — Patient Instructions (Signed)
   After Your ICD (Implantable Cardiac Defibrillator)    Monitor your defibrillator site for redness, swelling, and drainage. Call the device clinic at [phone] if you experience these symptoms or fever/chills.  Your incision was closed with Steri-strips or staples:  You may shower 7 days after your procedure and wash your incision with soap and water. Avoid lotions, ointments, or perfumes over your incision until it is well-healed.  You may use a hot tub or a pool after your wound check appointment if the incision is completely closed.  Do not lift, push or pull greater than 10 pounds with the affected arm until 4 weeks after your procedure. There are no other restrictions in arm movement after your wound check appointment.  Your ICD is designed to protect you from life threatening heart rhythms. Because of this, you may receive a shock.   1 shock with no symptoms:  Call the office during business hours. 1 shock with symptoms (chest pain, chest pressure, dizziness, lightheadedness, shortness of breath, overall feeling unwell):  Call 911. If you experience 2 or more shocks in 24 hours:  Call 911. If you receive a shock, you should not drive.  [place] DMV - no driving for 6 months if you receive appropriate therapy from your ICD.   ICD Alerts:  Some alerts are vibratory and others beep. These are NOT emergencies. Please call our office to let us know. If this occurs at night or on weekends, it can wait until the next business day. Send a remote transmission.  If your device is capable of reading fluid status (for heart failure), you will be offered monthly monitoring to review this with you.   Remote monitoring is used to monitor your ICD from home. This monitoring is scheduled every 91 days by our office. It allows [place] to keep an eye on the functioning of your device to ensure it is working properly. You will routinely see your Electrophysiologist annually (more often if necessary).

## 2023-09-28 ENCOUNTER — Inpatient Hospital Stay
Admission: EM | Admit: 2023-09-28 | Discharge: 2023-09-29 | DRG: 549 | Discharge status: Against Medical Advice | Payer: 59 | Source: Ambulatory Visit | Attending: Hospitalist | Admitting: Hospitalist

## 2023-09-28 ENCOUNTER — Other Ambulatory Visit: Payer: Self-pay

## 2023-09-28 ENCOUNTER — Emergency Department: Payer: 59

## 2023-09-28 DIAGNOSIS — N179 Acute kidney failure, unspecified: Secondary | ICD-10-CM | POA: Diagnosis present

## 2023-09-28 DIAGNOSIS — E1165 Type 2 diabetes mellitus with hyperglycemia: Secondary | ICD-10-CM | POA: Diagnosis present

## 2023-09-28 DIAGNOSIS — Z888 Allergy status to other drugs, medicaments and biological substances status: Secondary | ICD-10-CM

## 2023-09-28 DIAGNOSIS — I130 Hypertensive heart and chronic kidney disease with heart failure and stage 1 through stage 4 chronic kidney disease, or unspecified chronic kidney disease: Secondary | ICD-10-CM | POA: Diagnosis present

## 2023-09-28 DIAGNOSIS — Z885 Allergy status to narcotic agent status: Secondary | ICD-10-CM

## 2023-09-28 DIAGNOSIS — N1831 Chronic kidney disease, stage 3a: Secondary | ICD-10-CM | POA: Diagnosis present

## 2023-09-28 DIAGNOSIS — Z803 Family history of malignant neoplasm of breast: Secondary | ICD-10-CM

## 2023-09-28 DIAGNOSIS — Z79899 Other long term (current) drug therapy: Secondary | ICD-10-CM

## 2023-09-28 DIAGNOSIS — Z7982 Long term (current) use of aspirin: Secondary | ICD-10-CM

## 2023-09-28 DIAGNOSIS — E1122 Type 2 diabetes mellitus with diabetic chronic kidney disease: Secondary | ICD-10-CM | POA: Diagnosis present

## 2023-09-28 DIAGNOSIS — Z95810 Presence of automatic (implantable) cardiac defibrillator: Secondary | ICD-10-CM

## 2023-09-28 DIAGNOSIS — D472 Monoclonal gammopathy: Secondary | ICD-10-CM | POA: Diagnosis present

## 2023-09-28 DIAGNOSIS — M109 Gout, unspecified: Secondary | ICD-10-CM | POA: Diagnosis present

## 2023-09-28 DIAGNOSIS — M009 Pyogenic arthritis, unspecified: Principal | ICD-10-CM | POA: Diagnosis present

## 2023-09-28 DIAGNOSIS — Z5329 Procedure and treatment not carried out because of patient's decision for other reasons: Secondary | ICD-10-CM | POA: Diagnosis not present

## 2023-09-28 DIAGNOSIS — D631 Anemia in chronic kidney disease: Secondary | ICD-10-CM | POA: Diagnosis present

## 2023-09-28 DIAGNOSIS — R8281 Pyuria: Secondary | ICD-10-CM | POA: Diagnosis present

## 2023-09-28 DIAGNOSIS — M19022 Primary osteoarthritis, left elbow: Secondary | ICD-10-CM | POA: Diagnosis present

## 2023-09-28 DIAGNOSIS — Z87891 Personal history of nicotine dependence: Secondary | ICD-10-CM | POA: Diagnosis not present

## 2023-09-28 DIAGNOSIS — M25522 Pain in left elbow: Secondary | ICD-10-CM | POA: Diagnosis present

## 2023-09-28 DIAGNOSIS — Z90710 Acquired absence of both cervix and uterus: Secondary | ICD-10-CM

## 2023-09-28 DIAGNOSIS — Z7984 Long term (current) use of oral hypoglycemic drugs: Secondary | ICD-10-CM | POA: Diagnosis not present

## 2023-09-28 DIAGNOSIS — F32A Depression, unspecified: Secondary | ICD-10-CM | POA: Diagnosis present

## 2023-09-28 DIAGNOSIS — I10 Essential (primary) hypertension: Secondary | ICD-10-CM | POA: Diagnosis present

## 2023-09-28 DIAGNOSIS — K219 Gastro-esophageal reflux disease without esophagitis: Secondary | ICD-10-CM | POA: Diagnosis present

## 2023-09-28 DIAGNOSIS — I5022 Chronic systolic (congestive) heart failure: Secondary | ICD-10-CM | POA: Diagnosis present

## 2023-09-28 LAB — BASIC METABOLIC PANEL
Anion gap: 10 (ref 5–15)
BUN: 22 mg/dL (ref 8–23)
CO2: 26 mmol/L (ref 22–32)
Calcium: 9.2 mg/dL (ref 8.9–10.3)
Chloride: 99 mmol/L (ref 98–111)
Creatinine, Ser: 1.52 mg/dL — ABNORMAL HIGH (ref 0.44–1.00)
GFR, Estimated: 36 mL/min — ABNORMAL LOW (ref >60)
Glucose, Bld: 170 mg/dL — ABNORMAL HIGH (ref 70–99)
Potassium: 4 mmol/L (ref 3.5–5.1)
Sodium: 135 mmol/L (ref 135–145)

## 2023-09-28 LAB — URINALYSIS, ROUTINE W REFLEX MICROSCOPIC
Bilirubin Urine: NEGATIVE
Glucose, UA: >=500 mg/dL — AB
Ketones, ur: NEGATIVE mg/dL
Nitrite: NEGATIVE
Protein, ur: NEGATIVE mg/dL
Specific Gravity, Urine: 1.011 (ref 1.005–1.030)
pH: 5 (ref 5.0–8.0)

## 2023-09-28 LAB — CBC
HCT: 37 % (ref 36.0–46.0)
Hemoglobin: 11.6 g/dL — ABNORMAL LOW (ref 12.0–15.0)
MCH: 23.1 pg — ABNORMAL LOW (ref 26.0–34.0)
MCHC: 31.4 g/dL (ref 30.0–36.0)
MCV: 73.6 fL — ABNORMAL LOW (ref 80.0–100.0)
Platelets: 288 10*3/uL (ref 150–400)
RBC: 5.03 MIL/uL (ref 3.87–5.11)
RDW: 16.8 % — ABNORMAL HIGH (ref 11.5–15.5)
WBC: 12.6 10*3/uL — ABNORMAL HIGH (ref 4.0–10.5)
nRBC: 0 % (ref 0.0–0.2)

## 2023-09-28 LAB — CBG MONITORING, ED: Glucose-Capillary: 191 mg/dL — ABNORMAL HIGH (ref 70–99)

## 2023-09-28 LAB — SYNOVIAL CELL COUNT + DIFF, W/ CRYSTALS
Eosinophils-Synovial: 1 %
Lymphocytes-Synovial Fld: 0 %
Monocyte-Macrophage-Synovial Fluid: 1 %
Neutrophil, Synovial: 98 %
WBC, Synovial: 134470 /mm3 — ABNORMAL HIGH (ref 0–200)

## 2023-09-28 LAB — SEDIMENTATION RATE: Sed Rate: 66 mm/h — ABNORMAL HIGH (ref 0–30)

## 2023-09-28 MED ORDER — ESCITALOPRAM OXALATE 10 MG PO TABS
20.0000 mg | ORAL_TABLET | Freq: Every day | ORAL | Status: DC
Start: 2023-09-29 — End: 2023-09-29
  Administered 2023-09-29: 20 mg via ORAL
  Filled 2023-09-28: qty 2

## 2023-09-28 MED ORDER — METHYLPREDNISOLONE SODIUM SUCC 40 MG IJ SOLR
20.0000 mg | Freq: Two times a day (BID) | INTRAMUSCULAR | Status: DC
  Administered 2023-09-28 – 2023-09-29 (×2): 20 mg via INTRAVENOUS
  Filled 2023-09-28 (×2): qty 1

## 2023-09-28 MED ORDER — LIDOCAINE HCL (PF) 1 % IJ SOLN
10.0000 mL | Freq: Once | INTRAMUSCULAR | Status: AC
  Administered 2023-09-28: 10 mL
  Filled 2023-09-28: qty 10

## 2023-09-28 MED ORDER — OXYCODONE HCL 5 MG PO TABS: 5.0000 mg | ORAL_TABLET | Freq: Four times a day (QID) | ORAL | Status: DC | PRN

## 2023-09-28 MED ORDER — INSULIN ASPART 100 UNIT/ML IJ SOLN
0.0000 [IU] | Freq: Three times a day (TID) | INTRAMUSCULAR | Status: DC
  Administered 2023-09-29: 4 [IU] via SUBCUTANEOUS
  Filled 2023-09-28: qty 1

## 2023-09-28 MED ORDER — SODIUM CHLORIDE 0.9% FLUSH
3.0000 mL | Freq: Two times a day (BID) | INTRAVENOUS | Status: DC
  Administered 2023-09-28 (×2): 3 mL via INTRAVENOUS

## 2023-09-28 MED ORDER — ONDANSETRON HCL 4 MG/2ML IJ SOLN: 4.0000 mg | Freq: Four times a day (QID) | INTRAMUSCULAR | Status: DC | PRN

## 2023-09-28 MED ORDER — SODIUM CHLORIDE 0.9 % IV SOLN
2.0000 g | Freq: Two times a day (BID) | INTRAVENOUS | Status: DC
  Administered 2023-09-29: 2 g via INTRAVENOUS
  Filled 2023-09-28: qty 12.5

## 2023-09-28 MED ORDER — COLCHICINE 0.6 MG PO TABS
0.6000 mg | ORAL_TABLET | Freq: Every day | ORAL | Status: DC
  Administered 2023-09-29: 0.6 mg via ORAL
  Filled 2023-09-28: qty 1

## 2023-09-28 MED ORDER — ALLOPURINOL 100 MG PO TABS
100.0000 mg | ORAL_TABLET | Freq: Every day | ORAL | Status: DC
  Filled 2023-09-28 (×2): qty 1

## 2023-09-28 MED ORDER — ONDANSETRON HCL 4 MG PO TABS: 4.0000 mg | ORAL_TABLET | Freq: Four times a day (QID) | ORAL | Status: DC | PRN

## 2023-09-28 MED ORDER — ASPIRIN 81 MG PO TBEC
81.0000 mg | DELAYED_RELEASE_TABLET | Freq: Every day | ORAL | Status: DC
  Administered 2023-09-28 – 2023-09-29 (×2): 81 mg via ORAL
  Filled 2023-09-28 (×2): qty 1

## 2023-09-28 MED ORDER — VANCOMYCIN HCL IN DEXTROSE 1-5 GM/200ML-% IV SOLN
1000.0000 mg | INTRAVENOUS | Status: DC
Start: 2023-09-30 — End: 2023-09-29

## 2023-09-28 MED ORDER — POLYETHYLENE GLYCOL 3350 17 G PO PACK: 17.0000 g | PACK | Freq: Every day | ORAL | Status: DC | PRN

## 2023-09-28 MED ORDER — CARVEDILOL 6.25 MG PO TABS
25.0000 mg | ORAL_TABLET | Freq: Two times a day (BID) | ORAL | Status: DC
Start: 2023-09-29 — End: 2023-09-29
  Administered 2023-09-29: 25 mg via ORAL
  Filled 2023-09-28: qty 4

## 2023-09-28 MED ORDER — HYDROXYZINE HCL 25 MG PO TABS
25.0000 mg | ORAL_TABLET | Freq: Three times a day (TID) | ORAL | Status: DC | PRN
  Administered 2023-09-28: 25 mg via ORAL
  Filled 2023-09-28: qty 1

## 2023-09-28 MED ORDER — ACETAMINOPHEN 325 MG PO TABS
650.0000 mg | ORAL_TABLET | Freq: Four times a day (QID) | ORAL | Status: DC | PRN
  Administered 2023-09-28: 650 mg via ORAL
  Filled 2023-09-28: qty 2

## 2023-09-28 MED ORDER — VANCOMYCIN HCL IN DEXTROSE 1-5 GM/200ML-% IV SOLN
1000.0000 mg | Freq: Once | INTRAVENOUS | Status: AC
  Administered 2023-09-28: 1000 mg via INTRAVENOUS
  Filled 2023-09-28: qty 200

## 2023-09-28 MED ORDER — OXYCODONE-ACETAMINOPHEN 5-325 MG PO TABS
1.0000 | ORAL_TABLET | Freq: Once | ORAL | Status: AC
  Administered 2023-09-28: 1 via ORAL
  Filled 2023-09-28: qty 1

## 2023-09-28 MED ORDER — ACETAMINOPHEN 650 MG RE SUPP: 650.0000 mg | Freq: Four times a day (QID) | RECTAL | Status: DC | PRN

## 2023-09-28 MED ORDER — VANCOMYCIN HCL IN DEXTROSE 1-5 GM/200ML-% IV SOLN: 1000.0000 mg | Freq: Once | INTRAVENOUS | Status: DC

## 2023-09-28 MED ORDER — CEFEPIME HCL 2 G IV SOLR
2.0000 g | Freq: Once | INTRAVENOUS | Status: AC
  Administered 2023-09-28: 2 g via INTRAVENOUS
  Filled 2023-09-28: qty 12.5

## 2023-09-28 MED ORDER — HYDROMORPHONE HCL 1 MG/ML IJ SOLN: 0.5000 mg | INTRAMUSCULAR | Status: DC | PRN

## 2023-09-28 MED ORDER — HYDROMORPHONE HCL 1 MG/ML IJ SOLN
0.5000 mg | Freq: Once | INTRAMUSCULAR | Status: AC
  Administered 2023-09-28: 0.5 mg via INTRAMUSCULAR
  Filled 2023-09-28: qty 0.5

## 2023-09-28 MED ORDER — PANTOPRAZOLE SODIUM 40 MG PO TBEC
40.0000 mg | DELAYED_RELEASE_TABLET | Freq: Every day | ORAL | Status: DC
  Administered 2023-09-29: 40 mg via ORAL
  Filled 2023-09-28: qty 1

## 2023-09-28 MED ORDER — INSULIN ASPART 100 UNIT/ML IJ SOLN
0.0000 [IU] | Freq: Three times a day (TID) | INTRAMUSCULAR | Status: DC
  Administered 2023-09-28: 3 [IU] via SUBCUTANEOUS
  Filled 2023-09-28: qty 1

## 2023-09-28 MED ORDER — FERROUS GLUCONATE 324 (38 FE) MG PO TABS
324.0000 mg | ORAL_TABLET | Freq: Every day | ORAL | Status: DC
  Administered 2023-09-29: 324 mg via ORAL
  Filled 2023-09-28: qty 1

## 2023-09-28 NOTE — Assessment & Plan Note (Signed)
No report of abdominal pain or urinary symptoms.  Patient is already on antibiotics.

## 2023-09-28 NOTE — ED Triage Notes (Signed)
Pt to ED from [facility] for possible infection to left elbow. Reports hx of gout. Sent from [facility] d/t swelling, warmth in elbow. Currently taking meds for gout. +chills

## 2023-09-28 NOTE — Assessment & Plan Note (Signed)
History of CKD stage IIIa with creatinine generally between 1.1 and 1.3, currently 1.5 to concerning for very mild AKI.  - Push p.o. fluid intake - Small IV bolus - Hold home torsemide for today - Daily BMP - Strict in and out

## 2023-09-28 NOTE — ED Provider Notes (Signed)
[facility] Provider Note    Event Date/Time   First MD Initiated Contact with Patient 09/28/23 1103     (approximate)   History   Elbow Pain   HPI  Betty Luna is a 72 y.o. female with a history of diabetes, CHF, hypertension, and gout who presents with left elbow and arm pain gradual onset over the last 2 months, acutely worsened in the last 2 days associated with chills, nausea, and generalized weakness.  The patient was seen in the walk-in clinic and referred to the ED due to concern for an elbow infection.  The patient states she has a history of gout but has been taking colchicine and allopurinol without any relief.  She denies any trauma to the elbow.  I reviewed the past medical records including the [facility] walk-in clinic note from earlier today.  The patient was referred to the ED for rule out of septic arthritis.   Physical Exam   Triage Vital Signs: ED Triage Vitals [09/28/23 0955]  Encounter Vitals Group     BP (!) 142/65     Systolic BP Percentile      Diastolic BP Percentile      Pulse Rate 88     Resp 18     Temp 98.4 F (36.9 C)     Temp src      SpO2 98 %     Weight 222 lb (100.7 kg)     Height 5\' 2"  (1.575 m)     Head Circumference      Peak Flow      Pain Score 10     Pain Loc      Pain Education      Exclude from Growth Chart     Most recent vital signs: Vitals:   09/28/23 0955 09/28/23 1359  BP: (!) 142/65 (!) 146/74  Pulse: 88 92  Resp: 18 18  Temp: 98.4 F (36.9 C) 98.4 F (36.9 C)  SpO2: 98% 97%     General: Awake, no distress.  CV:  Good peripheral perfusion.  Resp:  Normal effort.  Abd:  No distention.  Other:  Left elbow held in about 120 degrees flexion with ability to flex and extend about 5 degrees in each direction with significant pain.  Palpable joint effusion and warm skin with no overlying erythema or induration.  No open wounds or lesions.  No fluctuant mass.   ED Results / Procedures  / Treatments   Labs (all labs ordered are listed, but only abnormal results are displayed) Labs Reviewed  BASIC METABOLIC PANEL - Abnormal; Notable for the following components:      Result Value   Glucose, Bld 170 (*)    Creatinine, Ser 1.52 (*)    GFR, Estimated 36 (*)    All other components within normal limits  CBC - Abnormal; Notable for the following components:   WBC 12.6 (*)    Hemoglobin 11.6 (*)    MCV 73.6 (*)    MCH 23.1 (*)    RDW 16.8 (*)    All other components within normal limits  SEDIMENTATION RATE - Abnormal; Notable for the following components:   Sed Rate 66 (*)    All other components within normal limits  URINALYSIS, ROUTINE W REFLEX MICROSCOPIC - Abnormal; Notable for the following components:   Color, Urine YELLOW (*)    APPearance HAZY (*)    Glucose, UA >=500 (*)    Hgb urine dipstick MODERATE (*)  Leukocytes,Ua MODERATE (*)    Bacteria, UA RARE (*)    All other components within normal limits  SYNOVIAL CELL COUNT + DIFF, W/ CRYSTALS - Abnormal; Notable for the following components:   Appearance-Synovial TURBID (*)    WBC, Synovial 134,470 (*)    All other components within normal limits  BODY FLUID CULTURE W GRAM STAIN  CULTURE, BLOOD (ROUTINE X 2)  CULTURE, BLOOD (ROUTINE X 2)     EKG     RADIOLOGY  XR L elbow: I independently viewed and interpreted the images; there is a joint effusion with no fracture.   PROCEDURES:  Critical Care performed: No  .Joint Aspiration/Arthrocentesis  Date/Time: 09/28/2023 12:26 PM  Performed by: Dionne Bucy, MD Authorized by: Dionne Bucy, MD   Consent:    Consent obtained:  Verbal   Consent given by:  Patient   Risks discussed:  Bleeding, infection, pain, incomplete drainage and nerve damage   Alternatives discussed:  Alternative treatment Universal protocol:    Patient identity confirmed:  Verbally with patient Location:    Location:  Elbow   Elbow:  L elbow Anesthesia:     Anesthesia method:  Local infiltration   Local anesthetic:  Lidocaine 1% w/o epi Procedure details:    Needle gauge: 21 G.   Ultrasound guidance: yes     Approach:  Lateral   Aspirate amount:  5 mL   Aspirate characteristics:  Cloudy and purulent   Specimen collected: yes   Post-procedure details:    Dressing:  Adhesive bandage   Procedure completion:  Tolerated well, no immediate complications    MEDICATIONS ORDERED IN ED: Medications  ceFEPIme (MAXIPIME) 2 g in sodium chloride 0.9 % 100 mL IVPB (2 g Intravenous New Bag/Given 09/28/23 1446)  vancomycin (VANCOCIN) IVPB 1000 mg/200 mL premix (has no administration in time range)    Followed by  vancomycin (VANCOCIN) IVPB 1000 mg/200 mL premix (has no administration in time range)  lidocaine (PF) (XYLOCAINE) 1 % injection 10 mL (10 mLs Other Given by Other 09/28/23 1156)  oxyCODONE-acetaminophen (PERCOCET/ROXICET) 5-325 MG per tablet 1 tablet (1 tablet Oral Given 09/28/23 1149)  HYDROmorphone (DILAUDID) injection 0.5 mg (0.5 mg Intramuscular Given 09/28/23 1206)     IMPRESSION / MDM / ASSESSMENT AND PLAN / ED COURSE  I reviewed the triage vital signs and the nursing notes.  72 year old female with PMH as noted above presents with acutely worsened left elbow pain over the last 2 days associated with subjective fever, chills, and malaise.  On exam the left elbow is an effusion and is warm to palpation but with no overlying erythema or other cutaneous findings.  The patient is very limited in range of motion.  Differential diagnosis includes, but is not limited to, septic arthritis, gout, pseudogout.  There is no evidence of cellulitis or other soft tissue infection.  We will obtain lab workup, x-ray, elbow arthrocentesis, and reassess.  Patient's presentation is most consistent with acute presentation with potential threat to life or bodily function.  ----------------------------------------- 3:09 PM on  09/28/2023 -----------------------------------------  Lab workup reveals WBC count of 12.  BMP shows normal electrolytes.  Arthrocentesis was performed successfully and reveals purulent appearing fluid which has  134,000 WBCs consistent with septic arthritis.  I have ordered the patient vancomycin and cefepime.  I consulted Dr. Rosann Auerbach from orthopedics.  I then consulted Dr. Huel Cote from the hospitalist service; based on her discussion she agrees to evaluate the patient for admission.   FINAL CLINICAL IMPRESSION(S) /  ED DIAGNOSES   Final diagnoses:  Pyogenic arthritis of left elbow, due to unspecified organism [facility])     Rx / DC Orders   ED Discharge Orders     None        Note:  This document was prepared using Dragon voice recognition software and may include unintentional dictation errors.    Dionne Bucy, MD 09/28/23 1510

## 2023-09-28 NOTE — Assessment & Plan Note (Signed)
Patient is currently not on any antihypertensive therapy due to previous hypotension.  Blood pressure elevated at this time.

## 2023-09-28 NOTE — ED Notes (Signed)
Ambulated to the bathroom, standby assist.

## 2023-09-28 NOTE — Assessment & Plan Note (Addendum)
Patient is presenting with 2 months of left shoulder pain that is suddenly worsened in the last 2 days with fevers and chills.  Arthrocentesis with markedly elevated WBC with neutrophil predominance in addition to urate crystals.  - Orthopedic surgery consulted; appreciate their recommendations - Telemetry monitoring given high risk for sepsis - Continue vancomycin and cefepime - Blood cultures pending - Synovial fluid culture pending - Pain control with Tylenol, oxycodone and Dilaudid - N.p.o. for now pending discussion with orthopedic surgery - If blood cultures are positive, will need to discuss AICD with cardiology

## 2023-09-28 NOTE — Consult Note (Signed)
ORTHOPEDIC CONSULTATION  Betty Luna [id number]  09/28/2023  CC:  Chief Complaint  Patient presents with   Elbow Pain    History of Present IlIness: The patient is a 72 y.o. female who presented to the emergency department today for increasing left elbow pain.  She is left-hand dominant.  The patient reports that she has a known history of gout that has affected her elbows in the past.  She reports she had a pacemaker put in several months ago and had to immobilize her left arm and she has had problems with stiffness and pain since then.  But, over the last few days her left elbow has become extremely painful to any attempts at range of motion.  She has had no penetrating trauma to the left elbow and has had no recent dental work performed.  She has been taking her allopurinol for gout but it is not helping.  The patient's left elbow was aspirated by the emergency department physician that revealed very cloudy abnormal appearing joint fluid that was submitted for cytology.  Admission history for completeness: Betty Luna is a 72 y.o. female with medical history significant of HFrEF with last EF of 30% s/p AICD for CRT, MGUS, CKD stage IIIA, hypertension, type 2 diabetes, gout, who presents to the ED due to elbow pain.   Betty Luna states she has been experiencing left elbow pain for approximately 2 months now, however over the last 2 days, the pain has become excruciating.  She also endorses subjective fever, chills, nausea but denies any vomiting.  She endorses generalized weakness but no falls.  She denies any shortness of breath, and states lower extremity edema appears resolved at this time.  She denies any recent procedures other than AICD placement in October.  She denies any dental work.  PMH:  Past Medical History:  Diagnosis Date   Anemia    Arthritis    CHF (congestive heart failure) (HCC)    Depression    Diabetes mellitus without complication (HCC)    GERD  (gastroesophageal reflux disease)    Gout    Hypertension     SH:  Past Surgical History:  Procedure Laterality Date   ABDOMINAL HYSTERECTOMY     BIV ICD INSERTION CRT-D N/A 08/06/2023   Procedure: BIV ICD INSERTION CRT-D;  Surgeon: Lanier Prude, MD;  Location: [facility] INVASIVE CV LAB;  Service: Cardiovascular;  Laterality: N/A;   CATARACT EXTRACTION W/PHACO Right 04/16/2017   Procedure: CATARACT EXTRACTION PHACO AND INTRAOCULAR LENS PLACEMENT (IOC);  Surgeon: Galen Manila, MD;  Location: [facility] ORS;  Service: Ophthalmology;  Laterality: Right;  [place] 01:06 AP% 16.5 CDE 11.03 Fluid pack lot # [id number]   CATARACT EXTRACTION W/PHACO Left 10/01/2017   Procedure: CATARACT EXTRACTION PHACO AND INTRAOCULAR LENS PLACEMENT (IOC)-LEFT DIABETIC;  Surgeon: Galen Manila, MD;  Location: [facility] ORS;  Service: Ophthalmology;  Laterality: Left;  [place] 01:05.7 AP% 12.2 CDE 7.42 Fluid pack lot # [id number]   CTR Left    EYE SURGERY Left    RIGHT/LEFT HEART CATH AND CORONARY ANGIOGRAPHY Bilateral 01/01/2022   Procedure: RIGHT/LEFT HEART CATH AND CORONARY ANGIOGRAPHY;  Surgeon: Iran Ouch, MD;  Location: [facility] INVASIVE CV LAB;  Service: Cardiovascular;  Laterality: Bilateral;   TONSILLECTOMY      ALL:  Allergies  Allergen Reactions   Omeprazole Other (See Comments)    CHEST PAIN    Tramadol Nausea And Vomiting   Atorvastatin Other (See Comments)    And another statin too, muscle pain  Codeine Anxiety and Other (See Comments)    MED: (Not in a hospital admission)   All home medications have been reviewed as documented in the medication reconciliation portion of the patient record.  FH:  Family History  Problem Relation Age of Onset   Breast cancer Sister     Social:  reports that she quit smoking about 18 years ago. Her smoking use included cigarettes. Her smokeless tobacco use includes snuff. She reports current alcohol use of about 12.0 standard drinks of alcohol per week. She  reports that she does not use drugs.  Review of Systems: General: Denies fever, chills, weight loss Eyes: Denies blurry vision, changes in vision ENT: Denies sore throat, congestions, nosebleeds CV: Denies chest pain, palpitations Respiratory: Denies shortness of breath, wheezing, cough Gl: Denies abdominal pain, nausea, vomiting GU: Denies hematuria Integumentary: Denies rashes or lesions Neuro: Denies headache, dizziness Psych: Negative Hem/Onc: Denies easy bruising or bleeding disorders Musculoskeletal: See HPI above.  Vitals: BP (!) 146/74 (BP Location: Right Arm)   Pulse 92   Temp 98.4 F (36.9 C) (Oral)   Resp 18   Ht 5\' 2"  (1.575 m)   Wt 100.7 kg   SpO2 97%   BMI 40.60 kg/m    Physical Exam: General: Awake, alert and oriented, no acute distress. Eyes: Pupils reactive, EOMI, normal conjunctiva, no scleral icterus. HENT: Normocephalic, atraumatic, normal hearing, moist oral mucosa Neck: Supple, non-tender, no cervical lymphadenopathy. Lungs: Chest rise is symmetric, non-labored respiration, chest wall nontender to palpation Heart: Normal rate by palpation, normal peripheral perfusion Abdomen: Soft, non-tender, non-distended. Pelvis is stable. Skin: Skin envelope intact, dry and pink, no rashes or lesions, no signs of infection. Neurologic: Awake, alert, and oriented X3 Psychiatric: Cooperative, appropriate mood and affect.  Musculoskeletal: Evaluation of the symptomatic left upper extremity reveals the elbow has greatly decreased range of motion.  The elbow is diffusely swollen and has an increased temperature compared to the surrounding skin.  A Band-Aid covers the lateral aspect from the previous aspiration with no current bleeding or signs of complication.  An effusion appears to be present to palpation.  The patient is neurologically intact to the radial, median, and ulnar nerves distally.  She has approximately 80 degrees of supination and 80 degrees of pronation but  flexion and extension are limited to approximately -45 degrees of extension to approximately 100 degrees of flexion secondary to pain.  Other than the aspiration site there appears to be no wounds around the elbow or signs of penetrating trauma.  Compartments of her forearm and hand are soft.  She is able to move her fingers and wrist gingerly but complains of elbow pain when doing so.  She has no tenderness to palpation around her left shoulder region.  Radiographic findings: Radiographs of the patient's left elbow reveal mild degenerative changes in the elbow joint and a large enthesophyte at the insertion of the triceps.  No acute fractures are noted.  Anterior and posterior sail signs are noted indicating a joint effusion.   Labs:  Recent Labs    09/28/23 1013  HGB 11.6*   Recent Labs    09/28/23 1013  WBC 12.6*  RBC 5.03  HCT 37.0  PLT 288   Recent Labs    09/28/23 1013  NA 135  K 4.0  CL 99  CO2 26  BUN 22  CREATININE 1.52*  GLUCOSE 170*  CALCIUM 9.2   Left elbow cytology: The left elbow aspirate reveals 137,470 white blood cells that  are 98% neutrophils.  Intracellular and extracellular urate crystals are noted.   Assessment/Plan:    Assessment: 72 year old left-hand-dominant female with known history of diabetes, chronic renal disease, and gout with concern for possible septic left elbow versus an acute gouty flare.   Plan:  I discussed with the patient that the elevated white blood cell count and the joint is higher than we normally see with an acute gouty flare but results that high have been reported.  Certainly infection has to be considered and I definitely agree the patient needs to be admitted for IV antibiotics that has already been instituted by the emergency department and continued by the hospitalist who is admitted the patient.  I discussed with the patient however that with her known history of previous elbow gout I believe that we need to also treat  gout.  At this stage I discussed that we will closely follow the patient and tomorrow she may have to undergo irrigation and debridement of her left elbow.  I discussed with the patient and the medical team that she will be difficult to manage because of her end-stage renal disease and diabetes with steroids.  Nonsteroidal anti-inflammatory medications should be avoided if possible because of her renal disease.  Will keep the patient n.p.o. after midnight and closely follow the results of her elbow Gram stain and culture results.  The patient is in agreement with this plan.  Cecil Cranker M.D. 09/28/2023 5:32 PM

## 2023-09-28 NOTE — Assessment & Plan Note (Signed)
Last A1c of 8.2%.  - Hold home regimen - SSI, moderate

## 2023-09-28 NOTE — Assessment & Plan Note (Signed)
Synovial fluid notable for monosodium urate crystals.  Patient is already on allopurinol and colchicine.  Will continue cautiously given elevated creatinine.  - Continue home allopurinol colchicine

## 2023-09-28 NOTE — Consult Note (Signed)
Pharmacy Antibiotic Note  Betty Luna is a 72 y.o. female admitted on 09/28/2023 with  septic joint .  Pharmacy has been consulted for Cefepime and Vancomycin dosing.  Plan: Cefepime 2gm q 12hrs  Vancomycin 2gm IV x 1 load, then vancomycin 1000 mg IV q 36hrs Goal AUC 400-550. Expected AUC: 496 SCr used: 1.52 3. Pharmacy will follow cultures and renal function to adjust therapy as needed.   Height: 5\' 2"  (157.5 cm) Weight: 100.7 kg (222 lb) IBW/kg (Calculated) : 50.1  Temp (24hrs), Avg:98.4 F (36.9 C), Min:98.4 F (36.9 C), Max:98.4 F (36.9 C)  Recent Labs  Lab 09/28/23 1013  WBC 12.6*  CREATININE 1.52*    Estimated Creatinine Clearance: 37.7 mL/min (A) (by C-G formula based on SCr of 1.52 mg/dL (H)).    Allergies  Allergen Reactions   Omeprazole Other (See Comments)    CHEST PAIN    Tramadol Nausea And Vomiting   Atorvastatin Other (See Comments)    And another statin too, muscle pain   Codeine Anxiety and Other (See Comments)    Antimicrobials this admission: Cefepime 12/7 >>  Vancomycin 12/7 >>     Microbiology results: 12/7 BCx: collected 12/7 Body fluid cx: collected  Thank you for allowing pharmacy to be a part of this patient's care.  Zaeem Kandel Rodriguez-Guzman PharmD, BCPS 09/28/2023 3:45 PM

## 2023-09-28 NOTE — Assessment & Plan Note (Signed)
Patient appears euvolemic at this time with no evidence of pulmonary or peripheral edema.  Holding torsemide in the setting of AKI.  - Resume home GDMT when able

## 2023-09-28 NOTE — H&P (Signed)
History and Physical    Patient: Betty Luna ZOX:[id number] DOB: 1951/05/18 DOA: 09/28/2023 DOS: the patient was seen and examined on 09/28/2023 PCP: Ethelda Chick, MD  Patient coming from: Home  Chief Complaint:  Chief Complaint  Patient presents with   Elbow Pain   HPI: Betty Luna is a 72 y.o. female with medical history significant of HFrEF with last EF of 30% s/p AICD for CRT, MGUS, CKD stage IIIA, hypertension, type 2 diabetes, gout, who presents to the ED due to elbow pain.  [name] states she has been experiencing left elbow pain for approximately 2 months now, however over the last 2 days, the pain has become excruciating.  She also endorses subjective fever, chills, nausea but denies any vomiting.  She endorses generalized weakness but no falls.  She denies any shortness of breath, and states lower extremity edema appears resolved at this time.  She denies any recent procedures other than AICD placement in October.  She denies any dental work.  ED course: On arrival to the ED, patient was hypertensive at 142/65 with heart rate of 88.  She was saturating at 98% on room air.  She was afebrile at 98.4.  Initial workup notable for WBC of 12.6, hemoglobin of 11.6, glucose 170, creat 1.52 with GFR of 36.  Urinalysis with moderate leukocytes, rare bacteria.  Left elbow x-ray notable for joint effusion.  Arthrocentesis was performed with WBC of 134,470, in addition to monosodium urate crystals.  [facility]ec surgery consulted.  Patient started on vancomycin and cefepime.  TRH contacted for admission.  Review of Systems: As mentioned in the history of present illness. All other systems reviewed and are negative.  Past Medical History:  Diagnosis Date   Anemia    Arthritis    CHF (congestive heart failure) (HCC)    Depression    Diabetes mellitus without complication (HCC)    GERD (gastroesophageal reflux disease)    Gout    Hypertension    Past Surgical History:   Procedure Laterality Date   ABDOMINAL HYSTERECTOMY     BIV ICD INSERTION CRT-D N/A 08/06/2023   Procedure: BIV ICD INSERTION CRT-D;  Surgeon: Lanier Prude, MD;  Location: [facility] INVASIVE CV LAB;  Service: Cardiovascular;  Laterality: N/A;   CATARACT EXTRACTION W/PHACO Right 04/16/2017   Procedure: CATARACT EXTRACTION PHACO AND INTRAOCULAR LENS PLACEMENT (IOC);  Surgeon: Galen Manila, MD;  Location: [facility] ORS;  Service: Ophthalmology;  Laterality: Right;  [place] 01:06 AP% 16.5 CDE 11.03 Fluid pack lot # [id number]   CATARACT EXTRACTION W/PHACO Left 10/01/2017   Procedure: CATARACT EXTRACTION PHACO AND INTRAOCULAR LENS PLACEMENT (IOC)-LEFT DIABETIC;  Surgeon: Galen Manila, MD;  Location: [facility] ORS;  Service: Ophthalmology;  Laterality: Left;  [place] 01:05.7 AP% 12.2 CDE 7.42 Fluid pack lot # [id number]   CTR Left    EYE SURGERY Left    RIGHT/LEFT HEART CATH AND CORONARY ANGIOGRAPHY Bilateral 01/01/2022   Procedure: RIGHT/LEFT HEART CATH AND CORONARY ANGIOGRAPHY;  Surgeon: Iran Ouch, MD;  Location: [facility] INVASIVE CV LAB;  Service: Cardiovascular;  Laterality: Bilateral;   TONSILLECTOMY     Social History:  reports that she quit smoking about 18 years ago. Her smoking use included cigarettes. Her smokeless tobacco use includes snuff. She reports current alcohol use of about 12.0 standard drinks of alcohol per week. She reports that she does not use drugs.  Allergies  Allergen Reactions   Omeprazole Other (See Comments)    CHEST PAIN    Tramadol Nausea And Vomiting  Atorvastatin Other (See Comments)    And another statin too, muscle pain   Codeine Anxiety and Other (See Comments)    Family History  Problem Relation Age of Onset   Breast cancer Sister     Prior to Admission medications   Medication Sig Start Date End Date Taking? Authorizing Provider  acetaminophen (TYLENOL) 500 MG tablet Take 500 mg by mouth every 6 (six) hours as needed for moderate pain.    [provider]  allopurinol (ZYLOPRIM) 100 MG tablet Take 100 mg by mouth daily.    [provider]  aspirin EC 81 MG tablet Take 1 tablet (81 mg total) by mouth daily. 08/12/23   Lanier Prude, MD  colchicine 0.6 MG tablet Take 1 tablet (0.6 mg total) by mouth daily. 08/14/23 08/13/24  Alford Highland, MD  dapagliflozin propanediol (FARXIGA) 10 MG TABS tablet Take 10 mg by mouth daily.    [provider]  escitalopram (LEXAPRO) 20 MG tablet Take 20 mg by mouth daily. 01/31/22   [provider]  ferrous sulfate 324 (65 Fe) MG TBEC Take 325 mg by mouth daily with breakfast.    [provider]  metFORMIN (GLUCOPHAGE-XR) 500 MG 24 hr tablet Take 1,000 mg by mouth 2 (two) times daily. 03/28/22   [provider]  pantoprazole (PROTONIX) 40 MG tablet Take 40 mg by mouth daily. 03/07/17   [provider]  pregabalin (LYRICA) 100 MG capsule Take 100 mg by mouth daily. Patient not taking: Reported on 08/13/2023 07/24/23   [provider]  torsemide (DEMADEX) 20 MG tablet Take 1 tablet (20 mg total) by mouth every other day. May take an additional tablet as needed for shortness of breath or swelling 08/14/23   Alford Highland, MD    Physical Exam: Vitals:   09/28/23 0955 09/28/23 1359  BP: (!) 142/65 (!) 146/74  Pulse: 88 92  Resp: 18 18  Temp: 98.4 F (36.9 C) 98.4 F (36.9 C)  TempSrc:  Oral  SpO2: 98% 97%  Weight: 100.7 kg   Height: 5\' 2"  (1.575 m)    Physical Exam Vitals and nursing note reviewed.  Constitutional:      General: She is not in acute distress.    Appearance: She is obese.  HENT:     Mouth/Throat:     Mouth: Mucous membranes are moist.     Pharynx: Oropharynx is clear.  Eyes:     Conjunctiva/sclera: Conjunctivae normal.     Pupils: Pupils are equal, round, and reactive to light.  Cardiovascular:     Rate and Rhythm: Normal rate and regular rhythm.     Heart sounds: No murmur heard.    No gallop.   Pulmonary:     Effort: Pulmonary effort is normal. No respiratory distress.     Breath sounds: Normal breath sounds. No wheezing, rhonchi or rales.  Abdominal:     General: Bowel sounds are normal.     Palpations: Abdomen is soft.  Musculoskeletal:     Right elbow: Normal.     Left elbow: Swelling and effusion present. Tenderness present.     Right lower leg: No edema.     Left lower leg: No edema.     Comments: Left elbow hot to the touch.  No other joint inflammation or tenderness noted  Skin:    General: Skin is warm and dry.  Neurological:     General: No focal deficit present.     Mental Status: She is  alert and oriented to person, place, and time. Mental status is at baseline.  Psychiatric:        Mood and Affect: Mood normal.        Behavior: Behavior normal.    Data Reviewed: CBC with WBC of 12.6, hemoglobin of 11.6, MCV of 73.6, platelets of 288 BMP with sodium of 135, potassium 4.0, bicarb 26, glucose 70, BUN 22, creatinine 1.52 with GFR of 36 Urinalysis with glucosuria, moderate hematuria, moderate leukocytes, and rare bacteria Left elbow synovial fluid with 134,470 WBC, 98% neutrophil in addition to monosodium urate crystals.  DG Elbow Complete Left  Result Date: 09/28/2023 CLINICAL DATA:  Pain and swelling. EXAM: LEFT ELBOW - COMPLETE 4 VIEW COMPARISON:  None Available. FINDINGS: Osteoarthritic changes with osteophytes. Large olecranon spur. There is evidence of joint effusion with displaced anterior and posterior fat pads. This can indicate presence of a radiographically occult fracture. Consider follow up images to assess for healing or further evaluation with CT, if indicated. IMPRESSION: Degenerative changes. Joint effusion. Electronically Signed   By: Layla Maw M.D.   On: 09/28/2023 10:52    Results are pending, will review when available.  Assessment and Plan:  * Septic arthritis (HCC) Patient is presenting with 2 months of left shoulder pain that is  suddenly worsened in the last 2 days with fevers and chills.  Arthrocentesis with markedly elevated WBC with neutrophil predominance in addition to urate crystals.  - Orthopedic surgery consulted; appreciate their recommendations - Telemetry monitoring given high risk for sepsis - Continue vancomycin and cefepime - Blood cultures pending - Synovial fluid culture pending - Pain control with Tylenol, oxycodone and Dilaudid - N.p.o. for now pending discussion with orthopedic surgery - If blood cultures are positive, will need to discuss AICD with cardiology  AKI (acute kidney injury) (HCC) History of CKD stage IIIa with creatinine generally between 1.1 and 1.3, currently 1.5 to concerning for very mild AKI.  - Push p.o. fluid intake - Small IV bolus - Hold home torsemide for today - Daily BMP - Strict in and out  Gout Synovial fluid notable for monosodium urate crystals.  Patient is already on allopurinol and colchicine.  Will continue cautiously given elevated creatinine.  - Continue home allopurinol colchicine  Chronic systolic CHF (congestive heart failure) (HCC) Patient appears euvolemic at this time with no evidence of pulmonary or peripheral edema.  Holding torsemide in the setting of AKI.  - Resume home GDMT when able  Uncontrolled type 2 diabetes mellitus with hyperglycemia, without long-term current use of insulin (HCC) Last A1c of 8.2%.  - Hold home regimen - SSI, moderate  HTN (hypertension), benign Patient is currently not on any antihypertensive therapy due to previous hypotension.  Blood pressure elevated at this time.  Pyuria No report of abdominal pain or urinary symptoms.  Patient is already on antibiotics.  Advance Care Planning:   Code Status: Full Code   Consults: Orthopedic surgery  Family Communication: Patient's husband updated at bedside  Severity of Illness: The appropriate patient status for this patient is INPATIENT. Inpatient status is judged  to be reasonable and necessary in order to provide the required intensity of service to ensure the patient's safety. The patient's presenting symptoms, physical exam findings, and initial radiographic and laboratory data in the context of their chronic comorbidities is felt to place them at high risk for further clinical deterioration. Furthermore, it is not anticipated that the patient will be medically stable for discharge from the hospital within  2 midnights of admission.   * I certify that at the point of admission it is my clinical judgment that the patient will require inpatient hospital care spanning beyond 2 midnights from the point of admission due to high intensity of service, high risk for further deterioration and high frequency of surveillance required.*  Author: Verdene Lennert, MD 09/28/2023 3:27 PM  For on call review [email or web address].

## 2023-09-29 DIAGNOSIS — M009 Pyogenic arthritis, unspecified: Secondary | ICD-10-CM | POA: Diagnosis not present

## 2023-09-29 LAB — CBC
HCT: 33.7 % — ABNORMAL LOW (ref 36.0–46.0)
Hemoglobin: 10.8 g/dL — ABNORMAL LOW (ref 12.0–15.0)
MCH: 23.5 pg — ABNORMAL LOW (ref 26.0–34.0)
MCHC: 32 g/dL (ref 30.0–36.0)
MCV: 73.3 fL — ABNORMAL LOW (ref 80.0–100.0)
Platelets: 305 10*3/uL (ref 150–400)
RBC: 4.6 MIL/uL (ref 3.87–5.11)
RDW: 16.2 % — ABNORMAL HIGH (ref 11.5–15.5)
WBC: 10.9 10*3/uL — ABNORMAL HIGH (ref 4.0–10.5)
nRBC: 0 % (ref 0.0–0.2)

## 2023-09-29 LAB — BASIC METABOLIC PANEL
Anion gap: 10 (ref 5–15)
BUN: 23 mg/dL (ref 8–23)
CO2: 24 mmol/L (ref 22–32)
Calcium: 9 mg/dL (ref 8.9–10.3)
Chloride: 99 mmol/L (ref 98–111)
Creatinine, Ser: 1.16 mg/dL — ABNORMAL HIGH (ref 0.44–1.00)
GFR, Estimated: 50 mL/min — ABNORMAL LOW (ref >60)
Glucose, Bld: 194 mg/dL — ABNORMAL HIGH (ref 70–99)
Potassium: 4.2 mmol/L (ref 3.5–5.1)
Sodium: 133 mmol/L — ABNORMAL LOW (ref 135–145)

## 2023-09-29 LAB — CBG MONITORING, ED: Glucose-Capillary: 191 mg/dL — ABNORMAL HIGH (ref 70–99)

## 2023-09-29 MED ORDER — PREDNISONE 20 MG PO TABS: ORAL_TABLET | ORAL | 0 refills | Status: DC

## 2023-09-29 MED ORDER — DOXYCYCLINE HYCLATE 50 MG PO CAPS: 50.0000 mg | ORAL_CAPSULE | Freq: Two times a day (BID) | ORAL | 0 refills | Status: AC

## 2023-09-29 MED ORDER — VANCOMYCIN HCL IN DEXTROSE 1-5 GM/200ML-% IV SOLN: 1000.0000 mg | INTRAVENOUS | Status: DC

## 2023-09-29 NOTE — Progress Notes (Signed)
ORTHOPEDIC PROGRESS NOTE  SUBJECTIVE:     The patient is alert and oriented x 3. The patient has a normal mood and affect. The patient is resting quietly in bed.  The patient reports that her left elbow is feeling at least 90% better.  Her pain is greatly decreased.  She reports she can move her elbow almost normally.  She reports that she agrees with me that her problem is gout and she would like to be discharged home.  As you recall, we kept the patient n.p.o. for possible irrigation and debridement of her possibly infected left elbow.  Evaluation was more concerning for gout clinically.  OBJECTIVE:  Vitals:   09/29/23 0625 09/29/23 0626  BP:  (!) 145/81  Pulse: 81 82  Resp: 14 13  Temp:    SpO2: 96% 96%    The patient's bilateral upper extremities are neurovascularly intact.  Their fingers are pink and warm with brisk capillary refill time.  Radial pulse is 2+ bilaterally.  The patient's redness and swelling around her left elbow are greatly improved.  Her range of motion has improved from -10 degrees of full extension to 150 degrees of flexion with 90 degrees of supination and 90 degrees of pronation.  The compartments of her forearm and hand are soft.  Palpation around the left elbow shows very minimal tenderness.  Labs:  Recent Labs    09/28/23 1013 09/29/23 0627  HGB 11.6* 10.8*   Recent Labs    09/28/23 1013 09/29/23 0627  WBC 12.6* 10.9*  RBC 5.03 4.60  HCT 37.0 33.7*  PLT 288 305   Recent Labs    09/28/23 1013 09/29/23 0627  NA 135 133*  K 4.0 4.2  CL 99 99  CO2 26 24  BUN 22 23  CREATININE 1.52* 1.16*  GLUCOSE 170* 194*  CALCIUM 9.2 9.0   No results for input(s): "LABPT", "INR" in the last 72 hours.    ASSESSMENT:  72 year old female with known history of gout presented to the emergency room with increasing pain and swelling.  Concern after aspiration of cloudy fluid was raised for a possible septic elbow versus an acute gouty flare.   The  patient's orthopedic condition is stable and they continue to improve daily.  The patient clinically has greatly improved in 12 hours on antibiotics and gout medication.  Intracellular and extracellular urate crystals were seen on cytology.  The cell count was definitely elevated to raise the concern of infection, but unfortunately gout can also cause this level of elevation in the white blood cell count.   PLAN:  I discussed with the patient that I would recommend that we get her a diet and continue to closely monitor for continued improvement.  She requested to be discharged home but I encouraged the patient to remain hospitalized to make sure she responds to the gout treatment and does not need surgical drainage of the elbow.  Once again I discussed the pathophysiology of gout and recommended her dietary restrictions and to resume her gout medicines upon discharge.   Cecil Cranker M.D. 09/29/2023 8:10 AM

## 2023-09-29 NOTE — Discharge Summary (Signed)
Physician AMA Discharge Summary   Betty Luna  female DOB: [birth year]  ZOX:[id number]  PCP: Ethelda Chick, MD  Admit date: 09/28/2023 Discharge date: 09/29/2023  Admitted From: home Disposition:  left AMA CODE STATUS: Full code   Hospital Course:  For full details, please see H&P, progress notes, consult notes and ancillary notes.  Briefly,  Betty Luna is a 72 y.o. female with medical history significant of HFrEF with last EF of 30% s/p AICD for CRT, MGUS, CKD stage IIIA, hypertension, type 2 diabetes, gout, who presented to the ED due to elbow pain.   Left elbow x-ray notable for joint effusion. Arthrocentesis was performed by ED provider with WBC of 134,470, in addition to monosodium urate crystals. ortho consulted. Patient started on vancomycin and cefepime, and IV solumedrol.   * Possible septic elbow versus an acute gouty flare, left  Arthrocentesis with markedly elevated WBC with neutrophil predominance in addition to urate crystals.  Pt's left elbow felt much better after arthrocentesis, however, it was too soon to rule out septic arthritis with cx not even 24 hours growth.  Ortho rec continued inpatient monitoring.  Pt insisted on leaving, therefore pt left AMA, however, medications prescribed for her to take at home.  Pt was prescribed 7 days of doxycycline and prednisone taper.   AKI (acute kidney injury) (HCC) CKD stage IIIa  --Cr 1.52 on presentation, improved to 1.16 next day.   Gout Synovial fluid notable for monosodium urate crystals.  Patient is already on allopurinol and colchicine.  Pt received IV solumedrol in the ED and was prescribed prednisone taper.   Chronic systolic CHF (congestive heart failure) (HCC) Patient appears euvolemic at this time with no evidence of pulmonary or peripheral edema.     Uncontrolled type 2 diabetes mellitus with hyperglycemia, without long-term current use of insulin (HCC) Last A1c of 8.2%.   HTN (hypertension),  benign  Pyuria No report of abdominal pain or urinary symptoms.     Discharge Diagnoses:  Principal Problem:   Septic arthritis (HCC) Active Problems:   AKI (acute kidney injury) (HCC)   Gout   Chronic systolic CHF (congestive heart failure) (HCC)   Uncontrolled type 2 diabetes mellitus with hyperglycemia, without long-term current use of insulin (HCC)   HTN (hypertension), benign   Pyuria   30 Day Unplanned Readmission Risk Score    Flowsheet Row ED to Hosp-Admission (Current) from 09/28/2023 in [facility] Emergency Department at [facility]  30 Day Unplanned Readmission Risk Score (%) 16.09 Filed at 09/29/2023 0801       This score is the patient's risk of an unplanned readmission within 30 days of being discharged (0 -100%). The score is based on dignosis, age, lab data, medications, orders, and past utilization.   Low:  0-14.9   Medium: 15-21.9   High: 22-29.9   Extreme: 30 and above         Discharge Instructions:  Allergies as of 09/29/2023       Reactions   Omeprazole Other (See Comments)   CHEST PAIN    Tramadol Nausea And Vomiting   Atorvastatin Other (See Comments)   And another statin too, muscle pain   Codeine Anxiety, Other (See Comments)        Medication List     STOP taking these medications    carvedilol 25 MG tablet Commonly known as: COREG   ciprofloxacin 500 MG tablet Commonly known as: CIPRO   ferrous gluconate 324 MG tablet Commonly known as: [employer]  pregabalin 100 MG capsule Commonly known as: LYRICA       TAKE these medications    acetaminophen 500 MG tablet Commonly known as: TYLENOL Take 500 mg by mouth every 6 (six) hours as needed for moderate pain.   allopurinol 100 MG tablet Commonly known as: ZYLOPRIM Take 100 mg by mouth daily.   aspirin EC 81 MG tablet Take 1 tablet (81 mg total) by mouth daily.   colchicine 0.6 MG tablet Take 1 tablet (0.6 mg total) by mouth daily.   doxycycline 50 MG  capsule Commonly known as: VIBRAMYCIN Take 1 capsule (50 mg total) by mouth 2 (two) times daily for 7 days.   escitalopram 20 MG tablet Commonly known as: LEXAPRO Take 20 mg by mouth daily.   Farxiga 10 MG Tabs tablet Generic drug: dapagliflozin propanediol Take 10 mg by mouth daily.   ferrous sulfate 324 (65 Fe) MG Tbec Take 325 mg by mouth daily with breakfast.   hydrOXYzine 25 MG tablet Commonly known as: ATARAX Take 25-50 mg by mouth at bedtime as needed.   losartan 25 MG tablet Commonly known as: COZAAR Take 25 mg by mouth daily.   metFORMIN 500 MG 24 hr tablet Commonly known as: GLUCOPHAGE-XR Take 1,000 mg by mouth 2 (two) times daily.   methocarbamol 500 MG tablet Commonly known as: ROBAXIN Take 500-1,000 mg by mouth every 6 (six) hours as needed.   ondansetron 4 MG disintegrating tablet Commonly known as: ZOFRAN-ODT Take 4 mg by mouth 3 (three) times daily as needed.   pantoprazole 40 MG tablet Commonly known as: PROTONIX Take 40 mg by mouth daily.   predniSONE 20 MG tablet Commonly known as: DELTASONE Take 40 mg (2 tabs) from 12/9 to 12/12, then 20 mg (1 tab) from 12/13 to 12/16, then 10 mg (1/2 tab) from 12/17 to 12/20, then done.   torsemide 20 MG tablet Commonly known as: DEMADEX Take 1 tablet (20 mg total) by mouth every other day. May take an additional tablet as needed for shortness of breath or swelling          Allergies  Allergen Reactions   Omeprazole Other (See Comments)    CHEST PAIN    Tramadol Nausea And Vomiting   Atorvastatin Other (See Comments)    And another statin too, muscle pain   Codeine Anxiety and Other (See Comments)     The results of significant diagnostics from this hospitalization (including imaging, microbiology, ancillary and laboratory) are listed below for reference.   Consultations:   Procedures/Studies: DG Elbow Complete Left  Result Date: 09/28/2023 CLINICAL DATA:  Pain and swelling. EXAM: LEFT ELBOW  - COMPLETE 4 VIEW COMPARISON:  None Available. FINDINGS: Osteoarthritic changes with osteophytes. Large olecranon spur. There is evidence of joint effusion with displaced anterior and posterior fat pads. This can indicate presence of a radiographically occult fracture. Consider follow up images to assess for healing or further evaluation with CT, if indicated. IMPRESSION: Degenerative changes. Joint effusion. Electronically Signed   By: Layla Maw M.D.   On: 09/28/2023 10:52      Labs: BNP (last 3 results) Recent Labs    03/15/23 1147 07/16/23 1140 07/18/23 1919  BNP 46.3 38.9 44.0   Basic Metabolic Panel: Recent Labs  Lab 09/28/23 1013 09/29/23 0627  NA 135 133*  K 4.0 4.2  CL 99 99  CO2 26 24  GLUCOSE 170* 194*  BUN 22 23  CREATININE 1.52* 1.16*  CALCIUM 9.2 9.0   Liver  Function Tests: No results for input(s): "AST", "ALT", "ALKPHOS", "BILITOT", "PROT", "ALBUMIN" in the last 168 hours. No results for input(s): "LIPASE", "AMYLASE" in the last 168 hours. No results for input(s): "AMMONIA" in the last 168 hours. CBC: Recent Labs  Lab 09/28/23 1013 09/29/23 0627  WBC 12.6* 10.9*  HGB 11.6* 10.8*  HCT 37.0 33.7*  MCV 73.6* 73.3*  PLT 288 305   Cardiac Enzymes: No results for input(s): "CKTOTAL", "CKMB", "CKMBINDEX", "TROPONINI" in the last 168 hours. BNP: Invalid input(s): "POCBNP" CBG: Recent Labs  Lab 09/28/23 1638 09/29/23 0733  GLUCAP 191* 191*   D-Dimer No results for input(s): "DDIMER" in the last 72 hours. Hgb A1c No results for input(s): "HGBA1C" in the last 72 hours. Lipid Profile No results for input(s): "CHOL", "HDL", "LDLCALC", "TRIG", "CHOLHDL", "LDLDIRECT" in the last 72 hours. Thyroid function studies No results for input(s): "TSH", "T4TOTAL", "T3FREE", "THYROIDAB" in the last 72 hours.  Invalid input(s): "FREET3" Anemia work up No results for input(s): "VITAMINB12", "FOLATE", "FERRITIN", "TIBC", "IRON", "RETICCTPCT" in the last 72  hours. Urinalysis    Component Value Date/Time   COLORURINE YELLOW (A) 09/28/2023 1136   APPEARANCEUR HAZY (A) 09/28/2023 1136   APPEARANCEUR Hazy 09/18/2013 0828   LABSPEC 1.011 09/28/2023 1136   LABSPEC 1.013 09/18/2013 0828   PHURINE 5.0 09/28/2023 1136   GLUCOSEU >=500 (A) 09/28/2023 1136   GLUCOSEU Negative 09/18/2013 0828   HGBUR MODERATE (A) 09/28/2023 1136   BILIRUBINUR NEGATIVE 09/28/2023 1136   BILIRUBINUR Negative 09/18/2013 0828   KETONESUR NEGATIVE 09/28/2023 1136   PROTEINUR NEGATIVE 09/28/2023 1136   NITRITE NEGATIVE 09/28/2023 1136   LEUKOCYTESUR MODERATE (A) 09/28/2023 1136   LEUKOCYTESUR 1+ 09/18/2013 0828   Sepsis Labs Recent Labs  Lab 09/28/23 1013 09/29/23 0627  WBC 12.6* 10.9*   Microbiology Recent Results (from the past 240 hour(s))  Body fluid culture w Gram Stain     Status: None (Preliminary result)   Collection Time: 09/28/23 11:37 AM   Specimen: Synovium; Body Fluid  Result Value Ref Range Status   Specimen Description   Final    SYNOVIAL Performed at [facility], [address] [place], [place] [address]    Special Requests LEFT ELBOW  Final   Gram Stain   Final    MODERATE WBC PRESENT,BOTH PMN AND MONONUCLEAR NO ORGANISMS SEEN    Culture   Final    NO GROWTH < 24 HOURS Performed at [facility], [address], [place], [place] [address]    Report Status PENDING  Incomplete  Culture, blood (routine x 2)     Status: None (Preliminary result)   Collection Time: 09/28/23  2:45 PM   Specimen: BLOOD  Result Value Ref Range Status   Specimen Description BLOOD BLOOD LEFT HAND  Final   Special Requests   Final    BOTTLES DRAWN AEROBIC AND ANAEROBIC Blood Culture results may not be optimal due to an inadequate volume of blood received in culture bottles   Culture   Final    NO GROWTH < 24 HOURS Performed at [facility], [address], [place], [place] [address]    Report Status PENDING  Incomplete  Culture,  blood (routine x 2)     Status: None (Preliminary result)   Collection Time: 09/28/23  2:45 PM   Specimen: BLOOD  Result Value Ref Range Status   Specimen Description BLOOD BLOOD LEFT ARM  Final   Special Requests   Final    BOTTLES  DRAWN AEROBIC AND ANAEROBIC Blood Culture results may not be optimal due to an inadequate volume of blood received in culture bottles   Culture   Final    NO GROWTH < 24 HOURS Performed at [facility], [address] [place], [place] [address]    Report Status PENDING  Incomplete     Billed as level 3 subsequent visit.   Darlin Priestly, MD  [facility] 09/29/2023, 11:23 AM

## 2023-09-29 NOTE — Discharge Instructions (Signed)
ORTHOPEDIC DISCHARGE INSTRUCTIONS  Follow Up Appointment:  Follow-up in the office in 10-14 days.  Please contact the [facility] at [phone]  to schedule your follow-up appointment.  Dressing and cast care instructions:  You may remove the Band-Aid from the left elbow after discharge from the hospital.  Keep the aspiration site clean and dry.  Weight-bearing status:  The patient has no weightbearing limitations.   Diet:   You may resume your regular diet as tolerated.  Begin with clear liquids and slowly advance to your normal diet.  Medications:   Take your medicines as prescribed. You may have written prescriptions or your prescriptions may have been E-prescribed to your pharmacy. If you were given prescriptions for any blood thinners, it is a very important that you take these medications as prescribed to prevent blood clots.  General instructions:  Elevate the affected extremity to control pain and swelling. Apply ice packs to the affected area to control pain and swelling.  Surgery and pain medications may lead to constipation. It is easier to prevent this than it is to treat it. We recommend MiraLAX or Senna/Senakot for laxatives and Colace as a stool softener as needed after surgery. Drink plenty of fluids to stay well-hydrated. Consult your local pharmacist for other treatment recommendations.   Please perform deep breathing exercises every hour to increase the airflow in your lungs which could predispose you to running a low-grade fever and increase your risk of pneumonia.

## 2023-09-29 NOTE — Consult Note (Signed)
Pharmacy Antibiotic Note  Betty Luna is a 72 y.o. female admitted on 09/28/2023 with  septic joint .  Pharmacy has been consulted for Cefepime and Vancomycin dosing.  Plan: Cefepime 2gm q 12hrs for Crcl 49 ml/min Scr improved 1.52>>1.16   Will adjust vancomycin to 1 gram IV q24h with improving Scr Goal AUC 400-550. Expected AUC:591 SCr used: 1.16 Cmin 16.6 Vd 0.5   BMI 40  3. Pharmacy will follow cultures and renal function to adjust therapy as needed.   Height: 5\' 2"  (157.5 cm) Weight: 100.7 kg (222 lb) IBW/kg (Calculated) : 50.1  Temp (24hrs), Avg:98.2 F (36.8 C), Min:98 F (36.7 C), Max:98.4 F (36.9 C)  Recent Labs  Lab 09/28/23 1013 09/29/23 0627  WBC 12.6* 10.9*  CREATININE 1.52* 1.16*    Estimated Creatinine Clearance: 49.4 mL/min (A) (by C-G formula based on SCr of 1.16 mg/dL (H)).    Allergies  Allergen Reactions   Omeprazole Other (See Comments)    CHEST PAIN    Tramadol Nausea And Vomiting   Atorvastatin Other (See Comments)    And another statin too, muscle pain   Codeine Anxiety and Other (See Comments)    Antimicrobials this admission: Cefepime 12/7 >>  Vancomycin 12/7 >>   Microbiology results: 12/7 BCx:NG<24hr 12/7 Body fluid cx: NG<24 hr  Thank you for allowing pharmacy to be a part of this patient's care.  Bari Mantis PharmD Clinical Pharmacist 09/29/2023

## 2023-10-02 LAB — BODY FLUID CULTURE W GRAM STAIN

## 2023-10-03 LAB — CULTURE, BLOOD (ROUTINE X 2)
Culture: NO GROWTH
Culture: NO GROWTH

## 2023-10-21 ENCOUNTER — Ambulatory Visit: Payer: 59 | Attending: Cardiology | Admitting: Cardiology

## 2023-10-21 ENCOUNTER — Telehealth (HOSPITAL_COMMUNITY): Payer: Self-pay | Admitting: Licensed Clinical Social Worker

## 2023-10-21 VITALS — BP 153/82 | HR 87 | Ht 62.0 in | Wt 220.0 lb

## 2023-10-21 DIAGNOSIS — D563 Thalassemia minor: Secondary | ICD-10-CM | POA: Diagnosis not present

## 2023-10-21 DIAGNOSIS — Z79899 Other long term (current) drug therapy: Secondary | ICD-10-CM | POA: Diagnosis not present

## 2023-10-21 DIAGNOSIS — Z6841 Body Mass Index (BMI) 40.0 and over, adult: Secondary | ICD-10-CM | POA: Insufficient documentation

## 2023-10-21 DIAGNOSIS — I428 Other cardiomyopathies: Secondary | ICD-10-CM | POA: Diagnosis not present

## 2023-10-21 DIAGNOSIS — I447 Left bundle-branch block, unspecified: Secondary | ICD-10-CM | POA: Diagnosis not present

## 2023-10-21 DIAGNOSIS — E785 Hyperlipidemia, unspecified: Secondary | ICD-10-CM | POA: Diagnosis not present

## 2023-10-21 DIAGNOSIS — Z87440 Personal history of urinary (tract) infections: Secondary | ICD-10-CM | POA: Diagnosis not present

## 2023-10-21 DIAGNOSIS — Z7984 Long term (current) use of oral hypoglycemic drugs: Secondary | ICD-10-CM | POA: Insufficient documentation

## 2023-10-21 DIAGNOSIS — I2510 Atherosclerotic heart disease of native coronary artery without angina pectoris: Secondary | ICD-10-CM | POA: Diagnosis not present

## 2023-10-21 DIAGNOSIS — I5022 Chronic systolic (congestive) heart failure: Secondary | ICD-10-CM | POA: Insufficient documentation

## 2023-10-21 DIAGNOSIS — I110 Hypertensive heart disease with heart failure: Secondary | ICD-10-CM | POA: Diagnosis not present

## 2023-10-21 DIAGNOSIS — D472 Monoclonal gammopathy: Secondary | ICD-10-CM | POA: Diagnosis not present

## 2023-10-21 DIAGNOSIS — R079 Chest pain, unspecified: Secondary | ICD-10-CM | POA: Insufficient documentation

## 2023-10-21 DIAGNOSIS — E669 Obesity, unspecified: Secondary | ICD-10-CM | POA: Diagnosis not present

## 2023-10-21 DIAGNOSIS — E119 Type 2 diabetes mellitus without complications: Secondary | ICD-10-CM | POA: Insufficient documentation

## 2023-10-21 NOTE — Patient Instructions (Signed)
Medication Changes:  STOP taking farxiga  CALL CLINIC to review your medications when you get home  We are contacting our social worker to see if you could qualify for paramedicine home program. Someone from our office will call you with further information.    Special Instructions // Education:  Do the following things EVERYDAY: Weigh yourself in the morning before breakfast. Write it down and keep it in a log. Take your medicines as prescribed Eat low salt foods--Limit salt (sodium) to 2000 mg per day.  Stay as active as you can everyday Limit all fluids for the day to less than 2 liters   Follow-Up in: 3 months with Dr. Gasper Lloyd    If you have any questions or concerns before your next appointment please send [place] a message through [facility] or call our office at [phone] Monday-Friday 8 am-5 pm.   If you have an urgent need after hours on the weekend please call your Primary Cardiologist or the [facility] in [place] at [phone].   At the [facility], you and your health needs are our priority. We have a designated team specialized in the treatment of Heart Failure. This Care Team includes your primary Heart Failure Specialized Cardiologist (physician), Advanced Practice Providers (APPs- Physician Assistants and Nurse Practitioners), and Pharmacist who all work together to provide you with the care you need, when you need it.   You may see any of the following providers on your designated Care Team at your next follow up:  Dr. Arvilla Meres Dr. Marca Ancona Dr. Dorthula Nettles Dr. Theresia Bough Tonye Becket, NP Robbie Lis, [place] [address] [place], [place] Brynda Peon, NP [place] Lee, NP Clarisa Kindred, NP Enos Fling, PharmD

## 2023-10-21 NOTE — Telephone Encounter (Signed)
CSW received referral to assist patient with some community resources and assistance with medication management. CSW left message for patient to return call to discuss further and clinic staff to refer for home care services for medication management. Lasandra Beech, LCSW, CCSW-MCS [phone]

## 2023-10-21 NOTE — Addendum Note (Signed)
Addended by: Simonne Maffucci on: 10/21/2023 02:57 PM   Modules accepted: Orders

## 2023-10-21 NOTE — Progress Notes (Signed)
ADVANCED HEART FAILURE CLINIC NOTE  Referring Physician: Ethelda Chick, MD  Primary Care: Ethelda Chick, MD Primary Cardiologist: Azucena Cecil, MD  HPI: Betty Luna is a 72 y.o. female with nonischemic cardiomyopathy, hypertension, hyperlipidemia presenting today for follow up.  Based on chart review it appears that Betty Luna's cardiac history dates back to February 2023 when she presented with symptoms of chest pain; during that time she lost her daughter, stepdaughter and husband was diagnosed with Alzheimer's.  She had an echocardiogram in March 2023 that demonstrated EF of 35 to 40% and follow-up left heart cath with mild nonobstructive CAD.  She was started on GDMT and since that time repeat imaging has demonstrated persistently worsening LV function with cardiac MRI in April 2024 with LVEF of 25% and otherwise preserved RV function.  Interval hx:  - Discharged on 09/29/23 with left elbow effusion; underwent arthrocentesis w/ elevated WBC ct and neutrophilic predominance + urate crystals; concerning for infectious etiology. Unfortunately she left AMA after receiving a very short course of IV antibiotics; discharged on doxycycline & prednisone taper.  - Admitted on 08/14/23 with UTI s/p rocephin & keflex (Morganella).  - BiV device implant on 08/06/23 - Reports that she has no fevers, chills or infectious symptoms; no pain at the elbow anymore. In addition, no UTI symptoms at this stime.   Activity level/exercise tolerance:  NYHA II, reports that she feels stronger since getting BiV upgrade. Orthopnea:  Sleeps on no pillows Paroxysmal noctural dyspnea:  NO Chest pain/pressure: Yes Orthostatic lightheadedness:  no Palpitations:  no Lower extremity edema:  no Presyncope/syncope:  no Cough:  no  Past Medical History:  Diagnosis Date   Anemia    Arthritis    CHF (congestive heart failure) (HCC)    Depression    Diabetes mellitus without complication (HCC)    GERD  (gastroesophageal reflux disease)    Gout    Hypertension     Current Outpatient Medications  Medication Sig Dispense Refill   acetaminophen (TYLENOL) 500 MG tablet Take 500 mg by mouth every 6 (six) hours as needed for moderate pain.     allopurinol (ZYLOPRIM) 100 MG tablet Take 100 mg by mouth daily.     aspirin EC 81 MG tablet Take 1 tablet (81 mg total) by mouth daily. 30 tablet 11   colchicine 0.6 MG tablet Take 1 tablet (0.6 mg total) by mouth daily.     dapagliflozin propanediol (FARXIGA) 10 MG TABS tablet Take 10 mg by mouth daily.     escitalopram (LEXAPRO) 20 MG tablet Take 20 mg by mouth daily.     ferrous sulfate 324 (65 Fe) MG TBEC Take 325 mg by mouth daily with breakfast.     hydrOXYzine (ATARAX) 25 MG tablet Take 25-50 mg by mouth at bedtime as needed.     losartan (COZAAR) 25 MG tablet Take 25 mg by mouth daily.     metFORMIN (GLUCOPHAGE-XR) 500 MG 24 hr tablet Take 1,000 mg by mouth 2 (two) times daily.     methocarbamol (ROBAXIN) 500 MG tablet Take 500-1,000 mg by mouth every 6 (six) hours as needed.     ondansetron (ZOFRAN-ODT) 4 MG disintegrating tablet Take 4 mg by mouth 3 (three) times daily as needed.     pantoprazole (PROTONIX) 40 MG tablet Take 40 mg by mouth daily.  5   predniSONE (DELTASONE) 20 MG tablet Take 40 mg (2 tabs) from 12/9 to 12/12, then 20 mg (1 tab) from 12/13 to 12/16, then 10  mg (1/2 tab) from 12/17 to 12/20, then done. 14 tablet 0   torsemide (DEMADEX) 20 MG tablet Take 1 tablet (20 mg total) by mouth every other day. May take an additional tablet as needed for shortness of breath or swelling     No current facility-administered medications for this visit.    Allergies  Allergen Reactions   Omeprazole Other (See Comments)    CHEST PAIN    Tramadol Nausea And Vomiting   Atorvastatin Other (See Comments)    And another statin too, muscle pain   Codeine Anxiety and Other (See Comments)      Social History   Socioeconomic History    Marital status: Married    Spouse name: Not on file   Number of children: Not on file   Years of education: Not on file   Highest education level: Not on file  Occupational History   Not on file  Tobacco Use   Smoking status: Former    Current packs/day: 0.00    Types: Cigarettes    Quit date: 2006    Years since quitting: 19.0   Smokeless tobacco: Current    Types: Snuff  Vaping Use   Vaping status: Never Used  Substance and Sexual Activity   Alcohol use: Yes    Alcohol/week: 12.0 standard drinks of alcohol    Types: 12 Cans of beer per week    Comment: occasonally   Drug use: No   Sexual activity: Not Currently  Other Topics Concern   Not on file  Social History Narrative   Not on file   Social Drivers of Health   Financial Resource Strain: Not on file  Food Insecurity: No Food Insecurity (08/13/2023)   Hunger Vital Sign    Worried About Running Out of Food in the Last Year: Never true    Ran Out of Food in the Last Year: Never true  Transportation Needs: No Transportation Needs (08/13/2023)   PRAPARE - Administrator, Civil Service (Medical): No    Lack of Transportation (Non-Medical): No  Physical Activity: Not on file  Stress: Not on file  Social Connections: Not on file  Intimate Partner Violence: Not At Risk (08/13/2023)   Humiliation, Afraid, Rape, and Kick questionnaire    Fear of Current or Ex-Partner: No    Emotionally Abused: No    Physically Abused: No    Sexually Abused: No      Family History  Problem Relation Age of Onset   Breast cancer Sister     PHYSICAL EXAM: Vitals:   10/21/23 1004  BP: (!) 153/82  Pulse: 87  SpO2: 99%   GENERAL: Well nourished, well developed, and in no apparent distress at rest.  HEENT: There is no scleral icterus.  The mucous membranes are pink and moist.   CHEST: There are no chest wall deformities. There is no chest wall tenderness. Respirations are unlabored.  Lungs- CTA B/L CARDIAC:  JVP: 7 cm           Normal rate with regular rhythm. No murmur.  Pulses are 2+ and symmetrical in upper and lower extremities. No edema.  ABDOMEN: Soft, non-tender, non-distended. There are normal bowel sounds.  EXTREMITIES: Warm and well perfused.  NEUROLOGIC: Patient is oriented x3 with no obvious focal neurologic deficits.  PSYCH: Patients affect is appropriate SKIN: Warm and dry; no lesions or wounds.      DATA REVIEW  ECG: 10/21/23: AsVp, QRSd 10/09/22: NSR w/ LBBB (QRSd  )  as per my personal interpretation 9/22: Normal sinus rhythm with incomplete left bundle branch block as per my read  ECHO: 06/01/22: LVEF 30-35%, normal RV function  as per my personal interpretation  CATH: 01/01/22: 1.  Mild nonobstructive coronary artery disease. 2.  Left ventricular angiography was not performed.  EF was moderately reduced by echo.  Moderately elevated left ventricular end-diastolic pressure. 3.  Right heart catheterization showed moderately elevated right and left-sided filling pressures, moderate pulmonary hypertension and normal cardiac output.  Pulmonary hypertension seems to be mostly venous.  CMR: 4/24, personally reviewed.  1.  Severely reduced LV systolic function, LVEF 26%.  2.  No evidence for LGE or scar.  3. No evidence for infiltrative disease or amyloid (normal ECV, relatively normal native T1).  4.  Normal RV size and function.  5.  No significant valvular abnormalities.  6.  Findings consistent with non-ischemic cardiomyopathy.  ASSESSMENT & PLAN:  Heart failure with reduced ejection fraction Etiology of HF: Nonischemic cardiomyopathy as demonstrated by coronary angiography above.  Cardiac MRI from April 2024 without significant LGE or evidence of infiltrative disease.  EKGs from as far back as 2022 demonstrate an incomplete left bundle branch block; QRS duration is not significantly lengthened at that time. Reports that her father may have had CHF and sister; otherwise  no FH.  NYHA class / AHA Stage:III, limited by pain in her back/knees; difficult to assess her functional status.  Volume status & Diuretics: torsemide 20mg  daily; euvolemic on exam today. Vasodilators: Losartan 25mg  daily; unable to tolerate even low dose Entresto due to recurrent lightheadedness & near syncope.  Beta-Blocker: reports that she is taking coreg, however, does not recall the dose. It is no longer on her med list. I have asked [place] to call us with her home meds. Will attempt to enroll in paramedicine program.   WUJ:[id number] 25mg  daily. Repeat labs today.  Cardiometabolic: discontinue farxiga due to frequent UTIs.  Devices therapies & Valvulopathies: Now s/p BiV upgrade. EKG today with QRSd of .  Advanced therapies:Not indicated.   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 Rickard Patience.   3. Hyperlipidemia - Followed by Dr. Myriam Forehand  4. HTN - losartan 25mg  daily; repeat BMP/BNP today.   5. T2DM - A1C 11.5 in 4/23; followed by her PCP.   6. Obesity -Body mass index is 40.24 kg/m. -discussed importance of weight loss  7. Left elbow swelling - Now resolved - Arthrocentesis inpatient with elevated WBC ct and urate crystals. No fevers/chills or signs of infection. No swelling at the elbow.   Betty Luna Advanced Heart Failure Mechanical Circulatory Support

## 2023-10-29 ENCOUNTER — Encounter: Payer: Self-pay | Admitting: Oncology

## 2023-10-30 DIAGNOSIS — I5022 Chronic systolic (congestive) heart failure: Secondary | ICD-10-CM | POA: Diagnosis not present

## 2023-11-01 ENCOUNTER — Encounter: Payer: Self-pay | Admitting: Oncology

## 2023-11-01 DIAGNOSIS — I5022 Chronic systolic (congestive) heart failure: Secondary | ICD-10-CM | POA: Diagnosis not present

## 2023-11-03 NOTE — Progress Notes (Signed)
 Electrophysiology Office Note:   Date:  11/05/2023  ID:  Betty Luna, DOB September 01, 1951, MRN [id number]  Primary Cardiologist: Redell Cave, MD Primary Heart Failure: None Electrophysiologist: OLE ONEIDA HOLTS, MD       History of Present Illness:   Betty Luna is a 73 y.o. female with h/o HFrEF, HTN, HLD, CKD IIIa, DM II, MGUS seen today for routine electrophysiology followup.   Admit from 12/7-12/8/24 for elbow pain in the setting of joint effusion.  Arthrocentesis showed WBC 134k, monosodium urate crystals She was treated with IV abx in the setting of possible septic arthritis vs acute gouty flare.  She left AMA and was prescribed 7d of doxycycline  and a prednisone  taper.     Since last being seen in our clinic the patient reports she has been doing well. She notes she left AMA from the ER as the Ortho MD told her she could go after they aspirated her elbow.  She reports her site itches from time to time.    She denies chest pain, palpitations, dyspnea, PND, orthopnea, nausea, vomiting, dizziness, syncope, edema, weight gain, or early satiety.   Review of systems complete and found to be negative unless listed in HPI.   EP Information / Studies Reviewed:    EKG is ordered today. Personal review as below.  EKG Interpretation Date/Time:  Tuesday November 05 2023 10:58:25 EST Ventricular Rate:  89 PR Interval:  164 QRS Duration:  150 QT Interval:  438 QTC Calculation: 532 R Axis:   -42  Text Interpretation: Atrial-sensed ventricular-paced rhythm Confirmed by Aniceto Jarvis ([id number]) on 11/05/2023 11:23:21 AM   ICD Interrogation-  reviewed in detail today,  See PACEART report.  Device History: Abbott BiV ICD with LBA lead implanted 08/06/23 for HFrEF / NICM, primary prevention. No acceptable branches in the CS for quadripolar lead placement.  History of appropriate therapy: No History of AAD therapy: No   Studies:  R/LHC 12/2021 > mild non-obs CAD ECHO 02/2022 > LVEF  30-35%, GII DD, LA mildly dilated, mild MV regurgitation  cMRI 01/2023 > normal LV size/thickness, severely reduced LV systolic function, EF 26%, global hypokinesis with septal bounce suggesting a bundle branch block, no LGE or scar, no valvular abnormalities, findings c/w NICM   Risk Assessment/Calculations:     HYPERTENSION CONTROL Vitals:   11/05/23 1106 11/05/23 1324  BP: (!) 162/90 (!) 150/80    The patient's blood pressure is elevated above target today.  In order to address the patient's elevated BP: Blood pressure will be monitored at home to determine if medication changes need to be made.           Physical Exam:   VS:  BP (!) 150/80   Pulse 89   Ht 5' 1 (1.549 m)   Wt 210 lb (95.3 kg)   SpO2 96%   BMI 39.68 kg/m    Wt Readings from Last 3 Encounters:  11/05/23 210 lb (95.3 kg)  10/21/23 220 lb (99.8 kg)  09/28/23 222 lb (100.7 kg)     GEN: Well nourished, well developed in no acute distress NECK: No JVD; No carotid bruits CARDIAC: Regular rate and rhythm, no murmurs, rubs, gallops.  Implant site well healed, no edema, erythema or tethering. Prominent scar line but not keloid.   RESPIRATORY:  Clear to auscultation without rales, wheezing or rhonchi  ABDOMEN: Soft, non-tender, non-distended EXTREMITIES:  No edema; No deformity   ASSESSMENT AND PLAN:    Chronic Systolic Dysfunction, NICM s/p Abbott CRT-D  LBBB NYHA III symptoms. S/p ICD for primary prevention. CS not amenable for quadripolar lead, LBA lead placed, RV programmed sub-threshold for LBAP -euvolemic today -Stable on an appropriate medical regimen -Normal ICD function -shock plan reviewed with patient  -See Pace Art report -No changes today -99% BiV pacing  -EKG with QRS 150 ms -reviewed infection awareness with patient given new device and recent concern for septic vs gouty arthritis flare in elbow.  Discussed in detail impact of infection in setting of BiV implant.   Hypertension  -BP  elevated in clinic but pt reports she has not taken her medications today yet -encouraged consistent medication use and follow up BP at home   Recent Gouty Arthritis with Elbow Effusion  -blood cultures negative from 09/28/23  -no growth from synovial fluid culture    Disposition:   Follow up with Dr. Cindie or Elvie Needle, NP in [place] at patient request in 6 months   Signed, Daphne Barrack, MSN, APRN, NP-C, AGACNP-BC  [facility] - Electrophysiology  11/05/2023, 1:25 PM

## 2023-11-05 ENCOUNTER — Encounter: Payer: Self-pay | Admitting: Pulmonary Disease

## 2023-11-05 ENCOUNTER — Ambulatory Visit: Payer: No Typology Code available for payment source | Attending: Physician Assistant | Admitting: Pulmonary Disease

## 2023-11-05 VITALS — BP 150/80 | HR 89 | Ht 61.0 in | Wt 210.0 lb

## 2023-11-05 DIAGNOSIS — I5022 Chronic systolic (congestive) heart failure: Secondary | ICD-10-CM

## 2023-11-05 DIAGNOSIS — I428 Other cardiomyopathies: Secondary | ICD-10-CM

## 2023-11-05 DIAGNOSIS — Z95810 Presence of automatic (implantable) cardiac defibrillator: Secondary | ICD-10-CM | POA: Diagnosis not present

## 2023-11-05 DIAGNOSIS — I10 Essential (primary) hypertension: Secondary | ICD-10-CM

## 2023-11-05 NOTE — Patient Instructions (Signed)
 Medication Instructions:  Your physician recommends that you continue on your current medications as directed. Please refer to the Current Medication list given to you today.  *If you need a refill on your cardiac medications before your next appointment, please call your pharmacy*  Lab Work: None ordered.  If you have labs (blood work) drawn today and your tests are completely normal, you will receive your results only by: MyChart Message (if you have MyChart) OR A paper copy in the mail If you have any lab test that is abnormal or we need to change your treatment, we will call you to review the results.  Testing/Procedures: None ordered.  Follow-Up: At [facility], you and your health needs are our priority.  As part of our continuing mission to provide you with exceptional heart care, we have created designated Provider Care Teams.  These Care Teams include your primary Cardiologist (physician) and Advanced Practice Providers (APPs -  Physician Assistants and Nurse Practitioners) who all work together to provide you with the care you need, when you need it.    Your next appointment:   6 months in [place]  The format for your next appointment:   In Person  Provider:   Dr Cindie  Remote monitoring is used to monitor your Pacemaker/ ICD from home. This monitoring reduces the number of office visits required to check your device to one time per year. It allows us  to keep an eye on the functioning of your device to ensure it is working properly.   Important Information About Sugar

## 2023-11-12 ENCOUNTER — Telehealth: Payer: Self-pay | Admitting: Cardiology

## 2023-11-12 ENCOUNTER — Ambulatory Visit (INDEPENDENT_AMBULATORY_CARE_PROVIDER_SITE_OTHER): Payer: 59

## 2023-11-12 DIAGNOSIS — Z7952 Long term (current) use of systemic steroids: Secondary | ICD-10-CM | POA: Diagnosis not present

## 2023-11-12 DIAGNOSIS — M109 Gout, unspecified: Secondary | ICD-10-CM | POA: Diagnosis not present

## 2023-11-12 DIAGNOSIS — I428 Other cardiomyopathies: Secondary | ICD-10-CM

## 2023-11-12 DIAGNOSIS — Z7984 Long term (current) use of oral hypoglycemic drugs: Secondary | ICD-10-CM | POA: Diagnosis not present

## 2023-11-12 DIAGNOSIS — I5022 Chronic systolic (congestive) heart failure: Secondary | ICD-10-CM

## 2023-11-12 DIAGNOSIS — Z604 Social exclusion and rejection: Secondary | ICD-10-CM | POA: Diagnosis not present

## 2023-11-12 DIAGNOSIS — Z950 Presence of cardiac pacemaker: Secondary | ICD-10-CM | POA: Diagnosis not present

## 2023-11-12 DIAGNOSIS — Z556 Problems related to health literacy: Secondary | ICD-10-CM | POA: Diagnosis not present

## 2023-11-12 DIAGNOSIS — Z87891 Personal history of nicotine dependence: Secondary | ICD-10-CM | POA: Diagnosis not present

## 2023-11-12 DIAGNOSIS — F32A Depression, unspecified: Secondary | ICD-10-CM | POA: Diagnosis not present

## 2023-11-12 DIAGNOSIS — Z7982 Long term (current) use of aspirin: Secondary | ICD-10-CM | POA: Diagnosis not present

## 2023-11-12 DIAGNOSIS — I110 Hypertensive heart disease with heart failure: Secondary | ICD-10-CM | POA: Diagnosis not present

## 2023-11-12 DIAGNOSIS — M25422 Effusion, left elbow: Secondary | ICD-10-CM | POA: Diagnosis not present

## 2023-11-12 DIAGNOSIS — M519 Unspecified thoracic, thoracolumbar and lumbosacral intervertebral disc disorder: Secondary | ICD-10-CM | POA: Diagnosis not present

## 2023-11-12 DIAGNOSIS — K219 Gastro-esophageal reflux disease without esophagitis: Secondary | ICD-10-CM | POA: Diagnosis not present

## 2023-11-12 LAB — CUP PACEART REMOTE DEVICE CHECK
Battery Remaining Longevity: 91 mo
Battery Remaining Percentage: 95 %
Battery Voltage: 3.19 V
Brady Statistic AP VP Percent: 1 %
Brady Statistic AP VS Percent: 0 %
Brady Statistic AS VP Percent: 99 %
Brady Statistic AS VS Percent: 1 %
Brady Statistic RA Percent Paced: 1 %
Date Time Interrogation Session: 20250121020026
HighPow Impedance: 75 Ohm
HighPow Impedance: 75 Ohm
Implantable Lead Connection Status: 753985
Implantable Lead Connection Status: 753985
Implantable Lead Connection Status: 753985
Implantable Lead Implant Date: 20241015
Implantable Lead Implant Date: 20241015
Implantable Lead Implant Date: 20241015
Implantable Lead Location: 753859
Implantable Lead Location: 753860
Implantable Lead Location: 753860
Implantable Pulse Generator Implant Date: 20241015
Lead Channel Impedance Value: 490 Ohm
Lead Channel Impedance Value: 530 Ohm
Lead Channel Impedance Value: 550 Ohm
Lead Channel Pacing Threshold Amplitude: 0.5 V
Lead Channel Pacing Threshold Amplitude: 0.625 V
Lead Channel Pacing Threshold Amplitude: 1 V
Lead Channel Pacing Threshold Pulse Width: 0.5 ms
Lead Channel Pacing Threshold Pulse Width: 0.5 ms
Lead Channel Pacing Threshold Pulse Width: 0.5 ms
Lead Channel Sensing Intrinsic Amplitude: 12 mV
Lead Channel Sensing Intrinsic Amplitude: 2.5 mV
Lead Channel Setting Pacing Amplitude: 0.25 V
Lead Channel Setting Pacing Amplitude: 1.5 V
Lead Channel Setting Pacing Amplitude: 2 V
Lead Channel Setting Pacing Pulse Width: 0.05 ms
Lead Channel Setting Pacing Pulse Width: 0.5 ms
Lead Channel Setting Sensing Sensitivity: 0.5 mV
Pulse Gen Serial Number: 5575132
Zone Setting Status: 755011

## 2023-11-13 NOTE — Telephone Encounter (Signed)
Hey Dr. Gasper Lloyd, You referred pt to home health for nursing/med management, but on their assessment, the pt scored high for needing PT services as well. Home health is asking if you're okay with ordering PT in addition to nursing? Thanks!

## 2023-11-14 DIAGNOSIS — M25422 Effusion, left elbow: Secondary | ICD-10-CM | POA: Diagnosis not present

## 2023-11-14 DIAGNOSIS — I110 Hypertensive heart disease with heart failure: Secondary | ICD-10-CM | POA: Diagnosis not present

## 2023-11-14 DIAGNOSIS — K219 Gastro-esophageal reflux disease without esophagitis: Secondary | ICD-10-CM | POA: Diagnosis not present

## 2023-11-14 DIAGNOSIS — Z87891 Personal history of nicotine dependence: Secondary | ICD-10-CM | POA: Diagnosis not present

## 2023-11-14 DIAGNOSIS — Z556 Problems related to health literacy: Secondary | ICD-10-CM | POA: Diagnosis not present

## 2023-11-14 DIAGNOSIS — Z950 Presence of cardiac pacemaker: Secondary | ICD-10-CM | POA: Diagnosis not present

## 2023-11-14 DIAGNOSIS — M109 Gout, unspecified: Secondary | ICD-10-CM | POA: Diagnosis not present

## 2023-11-14 DIAGNOSIS — Z7984 Long term (current) use of oral hypoglycemic drugs: Secondary | ICD-10-CM | POA: Diagnosis not present

## 2023-11-14 DIAGNOSIS — M519 Unspecified thoracic, thoracolumbar and lumbosacral intervertebral disc disorder: Secondary | ICD-10-CM | POA: Diagnosis not present

## 2023-11-14 DIAGNOSIS — Z7952 Long term (current) use of systemic steroids: Secondary | ICD-10-CM | POA: Diagnosis not present

## 2023-11-14 DIAGNOSIS — Z7982 Long term (current) use of aspirin: Secondary | ICD-10-CM | POA: Diagnosis not present

## 2023-11-14 DIAGNOSIS — F32A Depression, unspecified: Secondary | ICD-10-CM | POA: Diagnosis not present

## 2023-11-14 DIAGNOSIS — Z604 Social exclusion and rejection: Secondary | ICD-10-CM | POA: Diagnosis not present

## 2023-11-18 NOTE — Telephone Encounter (Signed)
Spoke with [facility] to give verbal orders from Dr. Gasper Lloyd for patient to receive home health nursing and PT.   They received the orders and state they will follow up after they see the patient.

## 2023-11-20 ENCOUNTER — Telehealth: Payer: Self-pay | Admitting: Cardiology

## 2023-11-20 DIAGNOSIS — K219 Gastro-esophageal reflux disease without esophagitis: Secondary | ICD-10-CM | POA: Diagnosis not present

## 2023-11-20 DIAGNOSIS — Z87891 Personal history of nicotine dependence: Secondary | ICD-10-CM | POA: Diagnosis not present

## 2023-11-20 DIAGNOSIS — M519 Unspecified thoracic, thoracolumbar and lumbosacral intervertebral disc disorder: Secondary | ICD-10-CM | POA: Diagnosis not present

## 2023-11-20 DIAGNOSIS — M25422 Effusion, left elbow: Secondary | ICD-10-CM | POA: Diagnosis not present

## 2023-11-20 DIAGNOSIS — F32A Depression, unspecified: Secondary | ICD-10-CM | POA: Diagnosis not present

## 2023-11-20 DIAGNOSIS — Z556 Problems related to health literacy: Secondary | ICD-10-CM | POA: Diagnosis not present

## 2023-11-20 DIAGNOSIS — Z604 Social exclusion and rejection: Secondary | ICD-10-CM | POA: Diagnosis not present

## 2023-11-20 DIAGNOSIS — M109 Gout, unspecified: Secondary | ICD-10-CM | POA: Diagnosis not present

## 2023-11-20 DIAGNOSIS — Z7984 Long term (current) use of oral hypoglycemic drugs: Secondary | ICD-10-CM | POA: Diagnosis not present

## 2023-11-20 DIAGNOSIS — Z7952 Long term (current) use of systemic steroids: Secondary | ICD-10-CM | POA: Diagnosis not present

## 2023-11-20 DIAGNOSIS — Z7982 Long term (current) use of aspirin: Secondary | ICD-10-CM | POA: Diagnosis not present

## 2023-11-20 DIAGNOSIS — I110 Hypertensive heart disease with heart failure: Secondary | ICD-10-CM | POA: Diagnosis not present

## 2023-11-20 DIAGNOSIS — Z950 Presence of cardiac pacemaker: Secondary | ICD-10-CM | POA: Diagnosis not present

## 2023-12-15 ENCOUNTER — Encounter: Payer: Self-pay | Admitting: Oncology

## 2023-12-23 NOTE — Progress Notes (Signed)
 Remote ICD transmission.

## 2024-01-07 ENCOUNTER — Encounter: Payer: Self-pay | Admitting: Oncology

## 2024-01-09 ENCOUNTER — Telehealth: Payer: Self-pay

## 2024-01-09 NOTE — Telephone Encounter (Signed)
 Received fax from Ileana Ladd RN from [facility] stating that the pt has been discharged from their home health due to noncompliance with their plan of treatment.

## 2024-01-13 ENCOUNTER — Telehealth: Payer: Self-pay | Admitting: Cardiology

## 2024-01-13 NOTE — Telephone Encounter (Signed)
 Pt phone kept ringing unable to confirm appt 01/14/24

## 2024-01-13 NOTE — Progress Notes (Unsigned)
 ADVANCED HEART FAILURE CLINIC NOTE  Referring Physician: Ethelda Chick, MD  Primary Care: Ethelda Chick, MD Primary Cardiologist: Azucena Cecil, MD  HPI: Betty Luna is a 73 y.o. female with nonischemic cardiomyopathy, hypertension, hyperlipidemia presenting today for follow up.  Based on chart review it appears that Betty Luna's cardiac history dates back to February 2023 when she presented with symptoms of chest pain; during that time she lost her daughter, stepdaughter and husband was diagnosed with Alzheimer's.  She had an echocardiogram in March 2023 that demonstrated EF of 35 to 40% and follow-up left heart cath with mild nonobstructive CAD.  She was started on GDMT and since that time repeat imaging has demonstrated persistently worsening LV function with cardiac MRI in April 2024 with LVEF of 25% and otherwise preserved RV function.  Interval hx:  - Discharged on 09/29/23 with left elbow effusion; underwent arthrocentesis w/ elevated WBC ct and neutrophilic predominance + urate crystals; concerning for infectious etiology. Unfortunately she left AMA after receiving a very short course of IV antibiotics; discharged on doxycycline & prednisone taper.  - Admitted on 08/14/23 with UTI s/p rocephin & keflex (Morganella).  - BiV device implant on 08/06/23 - Reports that she has no fevers, chills or infectious symptoms; no pain at the elbow anymore. In addition, no UTI symptoms at this stime.   Activity level/exercise tolerance:  NYHA II, reports that she feels stronger since getting BiV upgrade. Orthopnea:  Sleeps on no pillows Paroxysmal noctural dyspnea:  NO Chest pain/pressure: Yes Orthostatic lightheadedness:  no Palpitations:  no Lower extremity edema:  no Presyncope/syncope:  no Cough:  no   Current Outpatient Medications  Medication Sig Dispense Refill   acetaminophen (TYLENOL) 500 MG tablet Take 500 mg by mouth every 6 (six) hours as needed for moderate pain.      allopurinol (ZYLOPRIM) 100 MG tablet Take 100 mg by mouth daily. (Patient not taking: Reported on 11/05/2023)     aspirin EC 81 MG tablet Take 1 tablet (81 mg total) by mouth daily. 30 tablet 11   carvedilol (COREG) 25 MG tablet Take 25 mg by mouth 2 (two) times daily.     colchicine 0.6 MG tablet Take 1 tablet (0.6 mg total) by mouth daily.     losartan (COZAAR) 25 MG tablet Take 25 mg by mouth daily.     metFORMIN (GLUCOPHAGE-XR) 500 MG 24 hr tablet Take 1,000 mg by mouth 2 (two) times daily.     ondansetron (ZOFRAN-ODT) 4 MG disintegrating tablet Take 4 mg by mouth 3 (three) times daily as needed.     pantoprazole (PROTONIX) 40 MG tablet Take 40 mg by mouth daily.  5   torsemide (DEMADEX) 20 MG tablet Take 1 tablet (20 mg total) by mouth every other day. May take an additional tablet as needed for shortness of breath or swelling     No current facility-administered medications for this visit.     PHYSICAL EXAM: There were no vitals filed for this visit. GENERAL: NAD Lungs- *** CARDIAC:  JVP: *** cm          Normal rate with regular rhythm. *** murmur.  Pulses ***. *** edema.  ABDOMEN: Soft, non-tender, non-distended.  EXTREMITIES: Warm and well perfused.  NEUROLOGIC: No obvious FND   DATA REVIEW  ECG: 10/21/23: AsVp, QRSd 10/09/22: NSR w/ LBBB (QRSd )  as per my personal interpretation 9/22: Normal sinus rhythm with incomplete left bundle branch block as per my read  ECHO: 06/01/22: LVEF 30-35%,  normal RV function  as per my personal interpretation  CATH: 01/01/22: 1.  Mild nonobstructive coronary artery disease. 2.  Left ventricular angiography was not performed.  EF was moderately reduced by echo.  Moderately elevated left ventricular end-diastolic pressure. 3.  Right heart catheterization showed moderately elevated right and left-sided filling pressures, moderate pulmonary hypertension and normal cardiac output.  Pulmonary hypertension seems to be mostly  venous.  CMR: 4/24, personally reviewed.  1.  Severely reduced LV systolic function, LVEF 26%.  2.  No evidence for LGE or scar.  3. No evidence for infiltrative disease or amyloid (normal ECV, relatively normal native T1).  4.  Normal RV size and function.  5.  No significant valvular abnormalities.  6.  Findings consistent with non-ischemic cardiomyopathy.  ASSESSMENT & PLAN:  Heart failure with reduced ejection fraction Etiology of HF: Nonischemic cardiomyopathy as demonstrated by coronary angiography above.  Cardiac MRI from April 2024 without significant LGE or evidence of infiltrative disease.  EKGs from as far back as 2022 demonstrate an incomplete left bundle branch block; QRS duration is not significantly lengthened at that time. Reports that her father may have had CHF and sister; otherwise no FH.  NYHA class / AHA Stage:III, limited by pain in her back/knees; difficult to assess her functional status.  Volume status & Diuretics: torsemide 20mg  daily; euvolemic on exam today. Vasodilators: Losartan 25mg  daily; unable to tolerate even low dose Entresto due to recurrent lightheadedness & near syncope.  Beta-Blocker: reports that she is taking coreg, however, does not recall the dose. It is no longer on her med list. I have asked [place] to call us with her home meds. Will attempt to enroll in paramedicine program.   WUJ:WJXBJYNWGNFAOZ 25mg  daily. Repeat labs today.  Cardiometabolic: discontinue farxiga due to frequent UTIs.  Devices therapies & Valvulopathies: Now s/p BiV upgrade. EKG today with QRSd of .  Advanced therapies:Not indicated.   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 Rickard Patience.   3. Hyperlipidemia - Followed by Dr. Myriam Forehand  4. HTN - losartan 25mg  daily; repeat BMP/BNP today.   5. T2DM - A1C 11.5 in 4/23; followed by her PCP.   6. Obesity -There is no height or weight on file to calculate BMI. -discussed importance of weight  loss  7. Left elbow swelling - Now resolved - Arthrocentesis inpatient with elevated WBC ct and urate crystals. No fevers/chills or signs of infection. No swelling at the elbow.   Binyomin Brann Advanced Heart Failure Mechanical Circulatory Support

## 2024-01-14 ENCOUNTER — Ambulatory Visit: Payer: 59 | Attending: Cardiology | Admitting: Cardiology

## 2024-01-14 VITALS — BP 143/76 | HR 79 | Wt 223.0 lb

## 2024-01-14 DIAGNOSIS — E119 Type 2 diabetes mellitus without complications: Secondary | ICD-10-CM | POA: Diagnosis not present

## 2024-01-14 DIAGNOSIS — Z6841 Body Mass Index (BMI) 40.0 and over, adult: Secondary | ICD-10-CM | POA: Diagnosis not present

## 2024-01-14 DIAGNOSIS — D563 Thalassemia minor: Secondary | ICD-10-CM | POA: Diagnosis not present

## 2024-01-14 DIAGNOSIS — I110 Hypertensive heart disease with heart failure: Secondary | ICD-10-CM | POA: Insufficient documentation

## 2024-01-14 DIAGNOSIS — D472 Monoclonal gammopathy: Secondary | ICD-10-CM | POA: Insufficient documentation

## 2024-01-14 DIAGNOSIS — I10 Essential (primary) hypertension: Secondary | ICD-10-CM | POA: Diagnosis not present

## 2024-01-14 DIAGNOSIS — M25422 Effusion, left elbow: Secondary | ICD-10-CM | POA: Insufficient documentation

## 2024-01-14 DIAGNOSIS — I5022 Chronic systolic (congestive) heart failure: Secondary | ICD-10-CM | POA: Diagnosis present

## 2024-01-14 DIAGNOSIS — I2510 Atherosclerotic heart disease of native coronary artery without angina pectoris: Secondary | ICD-10-CM

## 2024-01-14 DIAGNOSIS — Z95810 Presence of automatic (implantable) cardiac defibrillator: Secondary | ICD-10-CM

## 2024-01-14 DIAGNOSIS — E669 Obesity, unspecified: Secondary | ICD-10-CM | POA: Diagnosis not present

## 2024-01-14 DIAGNOSIS — Z79899 Other long term (current) drug therapy: Secondary | ICD-10-CM | POA: Diagnosis not present

## 2024-01-14 DIAGNOSIS — I428 Other cardiomyopathies: Secondary | ICD-10-CM | POA: Diagnosis not present

## 2024-01-14 DIAGNOSIS — Z794 Long term (current) use of insulin: Secondary | ICD-10-CM

## 2024-01-14 DIAGNOSIS — E785 Hyperlipidemia, unspecified: Secondary | ICD-10-CM | POA: Diagnosis not present

## 2024-01-14 NOTE — Patient Instructions (Signed)
 Great to see you today!!  Medication Changes:  None, Please continue current medications  Lab Work:  Go DOWN to LOWER LEVEL (LL) to have your blood work completed inside of [employer] office.  We will only call you if the results are abnormal or if the provider would like to make medication changes.   Testing/Procedures:  Your physician has requested that you have an echocardiogram. Echocardiography is a painless test that uses sound waves to create images of your heart. It provides your doctor with information about the size and shape of your heart and how well your heart's chambers and valves are working. This procedure takes approximately one hour. There are no restrictions for this procedure. Please do NOT wear cologne, perfume, aftershave, or lotions (deodorant is allowed).  **You will check in for this at the [facility]. You have to arrive 15 MINS EARLY for preparation, otherwise you will have to reschedule.  Please note: We ask at that you not bring children with you during ultrasound (echo/ vascular) testing. Due to room size and safety concerns, children are not allowed in the ultrasound rooms during exams. Our front office staff cannot provide observation of children in our lobby area while testing is being conducted. An adult accompanying a patient to their appointment will only be allowed in the ultrasound room at the discretion of the ultrasound technician under special circumstances. We apologize for any inconvenience.  Special Instructions // Education:  Do the following things EVERYDAY: Weigh yourself in the morning before breakfast. Write it down and keep it in a log. Take your medicines as prescribed Eat low salt foods--Limit salt (sodium) to 2000 mg per day.  Stay as active as you can everyday Limit all fluids for the day to less than 2 liters   Follow-Up in: 3 months    If you have any questions or concerns before your next appointment please send [place] a message  through [place] or call our office at [phone], If it is after office hours your call will be answered by our answering service and directed appropriately.     At the [facility], you and your health needs are our priority. We have a designated team specialized in the treatment of Heart Failure. This Care Team includes your primary Heart Failure Specialized Cardiologist (physician), Advanced Practice Providers (APPs- Physician Assistants and Nurse Practitioners), and Pharmacist who all work together to provide you with the care you need, when you need it.   You may see any of the following providers on your designated Care Team at your next follow up:  Dr. Arvilla Meres Dr. Marca Ancona Dr. Dorthula Nettles Dr. Theresia Bough Tonye Becket, NP Robbie Lis, [place] [address] [place], [place] Brynda Peon, NP [place] Lee, NP Clarisa Kindred, NP Enos Fling, PharmD

## 2024-01-15 LAB — BASIC METABOLIC PANEL
BUN/Creatinine Ratio: 17 (ref 12–28)
BUN: 15 mg/dL (ref 8–27)
CO2: 25 mmol/L (ref 20–29)
Calcium: 9.7 mg/dL (ref 8.7–10.3)
Chloride: 101 mmol/L (ref 96–106)
Creatinine, Ser: 0.9 mg/dL (ref 0.57–1.00)
Glucose: 178 mg/dL — ABNORMAL HIGH (ref 70–99)
Potassium: 4.1 mmol/L (ref 3.5–5.2)
Sodium: 142 mmol/L (ref 134–144)
eGFR: 68 mL/min/{1.73_m2} (ref >59)

## 2024-01-15 LAB — BRAIN NATRIURETIC PEPTIDE: BNP: 190.3 pg/mL — ABNORMAL HIGH (ref 0.0–100.0)

## 2024-02-04 ENCOUNTER — Other Ambulatory Visit: Payer: Self-pay | Admitting: Cardiology

## 2024-02-11 ENCOUNTER — Ambulatory Visit: Payer: 59

## 2024-02-11 DIAGNOSIS — I428 Other cardiomyopathies: Secondary | ICD-10-CM | POA: Diagnosis not present

## 2024-02-12 ENCOUNTER — Ambulatory Visit: Admission: RE | Admit: 2024-02-12 | Source: Ambulatory Visit

## 2024-02-12 LAB — CUP PACEART REMOTE DEVICE CHECK
Battery Remaining Longevity: 88 mo
Battery Remaining Percentage: 92 %
Battery Voltage: 3.16 V
Brady Statistic AP VP Percent: 1 %
Brady Statistic AP VS Percent: 1 %
Brady Statistic AS VP Percent: 99 %
Brady Statistic AS VS Percent: 1 %
Brady Statistic RA Percent Paced: 1 %
Date Time Interrogation Session: 20250422020028
HighPow Impedance: 81 Ohm
HighPow Impedance: 81 Ohm
Implantable Lead Connection Status: 753985
Implantable Lead Connection Status: 753985
Implantable Lead Connection Status: 753985
Implantable Lead Implant Date: 20241015
Implantable Lead Implant Date: 20241015
Implantable Lead Implant Date: 20241015
Implantable Lead Location: 753859
Implantable Lead Location: 753860
Implantable Lead Location: 753860
Implantable Pulse Generator Implant Date: 20241015
Lead Channel Impedance Value: 460 Ohm
Lead Channel Impedance Value: 460 Ohm
Lead Channel Impedance Value: 460 Ohm
Lead Channel Pacing Threshold Amplitude: 0.625 V
Lead Channel Pacing Threshold Amplitude: 0.625 V
Lead Channel Pacing Threshold Amplitude: 1 V
Lead Channel Pacing Threshold Pulse Width: 0.5 ms
Lead Channel Pacing Threshold Pulse Width: 0.5 ms
Lead Channel Pacing Threshold Pulse Width: 0.5 ms
Lead Channel Sensing Intrinsic Amplitude: 1.6 mV
Lead Channel Sensing Intrinsic Amplitude: 12 mV
Lead Channel Setting Pacing Amplitude: 0.25 V
Lead Channel Setting Pacing Amplitude: 1.625
Lead Channel Setting Pacing Amplitude: 2 V
Lead Channel Setting Pacing Pulse Width: 0.05 ms
Lead Channel Setting Pacing Pulse Width: 0.5 ms
Lead Channel Setting Sensing Sensitivity: 0.5 mV
Pulse Gen Serial Number: 5575132
Zone Setting Status: 755011

## 2024-03-26 NOTE — Progress Notes (Signed)
 Remote ICD transmission.

## 2024-03-26 NOTE — Addendum Note (Signed)
 Addended by: Lott Rouleau A on: 03/26/2024 02:30 PM   Modules accepted: Orders

## 2024-04-09 ENCOUNTER — Encounter (HOSPITAL_COMMUNITY): Admitting: Cardiology

## 2024-04-09 ENCOUNTER — Telehealth: Payer: Self-pay | Admitting: Cardiology

## 2024-04-09 NOTE — Telephone Encounter (Signed)
 Called to confirm/remind patient of their appointment at the [facility] on 04/10/24.   Appointment:   [] Confirmed  [x] Left mess   [] No answer/No voice mail  [] VM Full/unable to leave message  [] Phone not in service  Patient reminded to bring all medications and/or complete list.  Confirmed patient has transportation. Gave directions, instructed to utilize valet parking.

## 2024-04-10 ENCOUNTER — Other Ambulatory Visit
Admission: RE | Admit: 2024-04-10 | Discharge: 2024-04-10 | Disposition: A | Discharge status: Home / Self Care | Source: Ambulatory Visit | Attending: Cardiology | Admitting: Cardiology

## 2024-04-10 ENCOUNTER — Ambulatory Visit (HOSPITAL_BASED_OUTPATIENT_CLINIC_OR_DEPARTMENT_OTHER): Admitting: Cardiology

## 2024-04-10 VITALS — HR 81 | Wt 214.8 lb

## 2024-04-10 DIAGNOSIS — E785 Hyperlipidemia, unspecified: Secondary | ICD-10-CM

## 2024-04-10 DIAGNOSIS — D472 Monoclonal gammopathy: Secondary | ICD-10-CM | POA: Diagnosis not present

## 2024-04-10 DIAGNOSIS — M25422 Effusion, left elbow: Secondary | ICD-10-CM | POA: Diagnosis not present

## 2024-04-10 DIAGNOSIS — N1831 Chronic kidney disease, stage 3a: Secondary | ICD-10-CM

## 2024-04-10 DIAGNOSIS — I110 Hypertensive heart disease with heart failure: Secondary | ICD-10-CM | POA: Diagnosis not present

## 2024-04-10 DIAGNOSIS — I5022 Chronic systolic (congestive) heart failure: Secondary | ICD-10-CM | POA: Insufficient documentation

## 2024-04-10 DIAGNOSIS — I2510 Atherosclerotic heart disease of native coronary artery without angina pectoris: Secondary | ICD-10-CM | POA: Diagnosis not present

## 2024-04-10 DIAGNOSIS — E119 Type 2 diabetes mellitus without complications: Secondary | ICD-10-CM | POA: Insufficient documentation

## 2024-04-10 DIAGNOSIS — I428 Other cardiomyopathies: Secondary | ICD-10-CM | POA: Diagnosis not present

## 2024-04-10 DIAGNOSIS — Z79899 Other long term (current) drug therapy: Secondary | ICD-10-CM | POA: Diagnosis not present

## 2024-04-10 DIAGNOSIS — Z7984 Long term (current) use of oral hypoglycemic drugs: Secondary | ICD-10-CM | POA: Insufficient documentation

## 2024-04-10 DIAGNOSIS — D563 Thalassemia minor: Secondary | ICD-10-CM | POA: Diagnosis not present

## 2024-04-10 DIAGNOSIS — E669 Obesity, unspecified: Secondary | ICD-10-CM | POA: Diagnosis not present

## 2024-04-10 DIAGNOSIS — Z6841 Body Mass Index (BMI) 40.0 and over, adult: Secondary | ICD-10-CM | POA: Diagnosis not present

## 2024-04-10 DIAGNOSIS — I10 Essential (primary) hypertension: Secondary | ICD-10-CM | POA: Diagnosis not present

## 2024-04-10 DIAGNOSIS — E1169 Type 2 diabetes mellitus with other specified complication: Secondary | ICD-10-CM

## 2024-04-10 LAB — BASIC METABOLIC PANEL WITH GFR
Anion gap: 12 (ref 5–15)
BUN: 32 mg/dL — ABNORMAL HIGH (ref 8–23)
CO2: 24 mmol/L (ref 22–32)
Calcium: 9.1 mg/dL (ref 8.9–10.3)
Chloride: 98 mmol/L (ref 98–111)
Creatinine, Ser: 1.21 mg/dL — ABNORMAL HIGH (ref 0.44–1.00)
GFR, Estimated: 48 mL/min — ABNORMAL LOW (ref >60)
Glucose, Bld: 242 mg/dL — ABNORMAL HIGH (ref 70–99)
Potassium: 3.6 mmol/L (ref 3.5–5.1)
Sodium: 134 mmol/L — ABNORMAL LOW (ref 135–145)

## 2024-04-10 LAB — LIPID PANEL
Cholesterol: 235 mg/dL — ABNORMAL HIGH (ref 0–200)
HDL: 37 mg/dL — ABNORMAL LOW (ref >40)
LDL Cholesterol: 156 mg/dL — ABNORMAL HIGH (ref 0–99)
Total CHOL/HDL Ratio: 6.4 ratio
Triglycerides: 212 mg/dL — ABNORMAL HIGH (ref <150)
VLDL: 42 mg/dL — ABNORMAL HIGH (ref 0–40)

## 2024-04-10 LAB — BRAIN NATRIURETIC PEPTIDE: B Natriuretic Peptide: 53 pg/mL (ref 0.0–100.0)

## 2024-04-10 MED ORDER — ROSUVASTATIN CALCIUM 20 MG PO TABS: 20.0000 mg | ORAL_TABLET | Freq: Every day | ORAL | 3 refills | Status: AC

## 2024-04-10 NOTE — Patient Instructions (Signed)
 Medication Changes:  START Rosuvastatin  20mg  (1 tab) daily  Lab Work:  Go over to the [facility]ICAL [facility]. Go pass the gift shop and have your blood work completed.  We will only call you if the results are abnormal or if the provider would like to make medication changes.   Testing/Procedures:  Your physician has requested that you have an echocardiogram. Echocardiography is a painless test that uses sound waves to create images of your heart. It provides your doctor with information about the size and shape of your heart and how well your heart's chambers and valves are working. This procedure takes approximately one hour. There are no restrictions for this procedure. Please do NOT wear cologne, perfume, aftershave, or lotions (deodorant is allowed). Please arrive 15 minutes prior to your appointment time.  Please note: We ask at that you not bring children with you during ultrasound (echo/ vascular) testing. Due to room size and safety concerns, children are not allowed in the ultrasound rooms during exams. Our front office staff cannot provide observation of children in our lobby area while testing is being conducted. An adult accompanying a patient to their appointment will only be allowed in the ultrasound room at the discretion of the ultrasound technician under special circumstances. We apologize for any inconvenience.   Follow-Up in: Please follow up with the [facility] in 3 months with Dr. Bruce Caper.  At the [facility], you and your health needs are our priority. We have a designated team specialized in the treatment of Heart Failure. This Care Team includes your primary Heart Failure Specialized Cardiologist (physician), Advanced Practice Providers (APPs- Physician Assistants and Nurse Practitioners), and Pharmacist who all work together to provide you with the care you need, when you need it.   You may see any of the following providers on your  designated Care Team at your next follow up:  Dr. Jules Oar Dr. Peder Bourdon Dr. Alwin Baars Dr. Judyth Nunnery Shawnee Dellen, FNP Bevely Brush, RPH-CPP  Please be sure to bring in all your medications bottles to every appointment.   Need to Contact Us :  If you have any questions or concerns before your next appointment please send us  a message through [place] or call our office at [phone].    TO LEAVE A MESSAGE FOR THE NURSE SELECT OPTION 2, PLEASE LEAVE A MESSAGE INCLUDING: YOUR NAME DATE OF BIRTH CALL BACK NUMBER REASON FOR CALL**this is important as we prioritize the call backs  YOU WILL RECEIVE A CALL BACK THE SAME DAY AS LONG AS YOU CALL BEFORE 4:00 PM

## 2024-04-10 NOTE — Progress Notes (Unsigned)
 [facility] NOTE  Referring Physician: Charlane Cones, MD  Primary Care: Charlane Cones, MD Primary Cardiologist: Junnie Olives, MD  HPI: Betty Luna is a 73 y.o. female with nonischemic cardiomyopathy, hypertension, hyperlipidemia presenting today for follow up.  Based on chart review it appears that Ms. Betty Luna's cardiac history dates back to February 2023 when she presented with symptoms of chest pain; during that time she lost her daughter, stepdaughter and husband was diagnosed with Alzheimer's.  She had an echocardiogram in March 2023 that demonstrated EF of 35 to 40% and follow-up left heart cath with mild nonobstructive CAD.  She was started on GDMT and since that time repeat imaging has demonstrated persistently worsening LV function with cardiac MRI in April 2024 with LVEF of 25% and otherwise preserved RV function. She struggled with infections in late 2024; Discharged on 09/29/23 with left elbow effusion; underwent arthrocentesis w/ elevated WBC ct and neutrophilic predominance + urate crystals; concerning for infectious etiology. Unfortunately she left AMA after receiving a very short course of IV antibiotics; discharged on doxycycline  & prednisone  taper. Admitted on 08/14/23 with UTI s/p rocephin  & keflex  ([place]). Underwent BIV implant on 08/06/23.   Interval hx:  - She has been doing very well from a cardiac standpoint.  - Euvolemic on exam.  - Compliant with all medications. NYHA IIB; limited by back pain.    Current Outpatient Medications  Medication Sig Dispense Refill   acetaminophen  (TYLENOL ) 500 MG tablet Take 500 mg by mouth every 6 (six) hours as needed for moderate pain.     aspirin  EC 81 MG tablet Take 1 tablet (81 mg total) by mouth daily. 30 tablet 11   carvedilol  (COREG ) 25 MG tablet TAKE 1 TABLET(25 MG) BY MOUTH TWICE DAILY 60 tablet 11   colchicine  0.6 MG tablet Take 1 tablet (0.6 mg total) by mouth daily.     dapagliflozin   propanediol (FARXIGA ) 10 MG TABS tablet Take 10 mg by mouth daily.     ergocalciferol  (VITAMIN D2) 1.25 MG (50000 UT) capsule Take 50,000 Units by mouth once a week.     escitalopram  (LEXAPRO ) 10 MG tablet Take 10 mg by mouth daily. For mood     losartan  (COZAAR ) 25 MG tablet Take 25 mg by mouth daily.     pantoprazole  (PROTONIX ) 40 MG tablet Take 40 mg by mouth daily.  5   pregabalin  (LYRICA ) 100 MG capsule Take 100 mg by mouth 3 (three) times daily as needed (for pain).     torsemide  (DEMADEX ) 20 MG tablet Take 1 tablet (20 mg total) by mouth every other day. May take an additional tablet as needed for shortness of breath or swelling (Patient taking differently: Take 20 mg by mouth every other day. Take 1/2 tab every other day alternating with 1 whole tab every other day)     Ferrous Fumarate (HEMOCYTE - 106 MG FE) 324 (106 Fe) MG TABS tablet Take 1 tablet by mouth daily. (Patient not taking: Reported on 04/10/2024)     No current facility-administered medications for this visit.     PHYSICAL EXAM: Vitals:   04/10/24 1457  Pulse: 81  SpO2: 97%   GENERAL: NAD Lungs- CTA CARDIAC:  JVP: 7 cm          Normal rate with regular rhythm. No murmur.  Pulses 2+. No edema.  ABDOMEN: Soft, non-tender, non-distended.  EXTREMITIES: Warm and well perfused.  NEUROLOGIC: No obvious FND   DATA REVIEW  ECG: 10/21/23: AsVp, QRSd  10/09/22: NSR w/ LBBB (QRSd )  as per my personal interpretation 9/22: Normal sinus rhythm with incomplete left bundle branch block as per my read  ECHO: 06/01/22: LVEF 30-35%, normal RV function  as per my personal interpretation  CATH: 01/01/22: 1.  Mild nonobstructive coronary artery disease. 2.  Left ventricular angiography was not performed.  EF was moderately reduced by echo.  Moderately elevated left ventricular end-diastolic pressure. 3.  Right heart catheterization showed moderately elevated right and left-sided filling pressures, moderate pulmonary  hypertension and normal cardiac output.  Pulmonary hypertension seems to be mostly venous.  CMR: 4/24, personally reviewed.  1.  Severely reduced LV systolic function, LVEF 26%.  2.  No evidence for LGE or scar.  3. No evidence for infiltrative disease or amyloid (normal ECV, relatively normal native T1).  4.  Normal RV size and function.  5.  No significant valvular abnormalities.  6.  Findings consistent with non-ischemic cardiomyopathy.  ASSESSMENT & PLAN:  Heart failure with reduced ejection fraction Etiology of HF: Nonischemic cardiomyopathy as demonstrated by coronary angiography above.  Cardiac MRI from April 2024 without significant LGE or evidence of infiltrative disease.  EKGs from as far back as 2022 demonstrate an incomplete left bundle branch block; QRS duration is not significantly lengthened at that time. Reports that her father may have had CHF and sister; otherwise no FH.  NYHA class / AHA Stage:III, limited by pain in her back/knees; difficult to assess her functional status.  Volume status & Diuretics: torsemide  20mg  every other day; euvolemic on exam today.  Vasodilators: continue losartan  25mg  daily.  Beta-Blocker: taking coreg  25mg  BID MRA: spironolactone  25mg  daily.  Cardiometabolic: discontinue farxiga  due to frequent UTIs.  Devices therapies & Valvulopathies: Now s/p BiV upgrade. Seen by EP on 11/05/23, normal ICD function; 99% BiV pacing. No systemic infectious symptoms. Repeat TTE ASAP, patient now overdue. Last imaging was CMR in 02/06/23 w/ LVEF of 26%.   Advanced therapies:Not indicated.  - Refer to cardiac rehab.   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 Timmy Forbes.   3. Hyperlipidemia - Followed by Dr. Carl Charnley - would like to restart statins; previously held due to myopathy - Repeat lipid panel - If she continues to have myalgias, will start PCSK9i.  - start crestor  20mg  daily  4. HTN - see above - repeat BMP/BNP today.   5.  T2DM - A1C 11.5 in 4/23; down to 8.1 on 07/24/23.  - Followed by PCP; discussed healthy nutrition.   6. Obesity -Body mass index is 40.59 kg/m. -discussed importance of weight loss  7. Left elbow swelling - Arthrocentesis inpatient with elevated WBC ct and urate crystals. No fevers/chills or signs of infection. No swelling at the elbow.  - Resolved.   I spent 30 minutes caring for this patient today including face to face time, ordering and reviewing labs, reviewing records from ***, seeing the patient, documenting in the record, and arranging follow ups.    Laquida Cotrell [facility] Mechanical Circulatory Support

## 2024-04-27 ENCOUNTER — Encounter: Attending: Cardiology

## 2024-04-27 ENCOUNTER — Other Ambulatory Visit: Payer: Self-pay

## 2024-04-27 DIAGNOSIS — I5022 Chronic systolic (congestive) heart failure: Secondary | ICD-10-CM | POA: Insufficient documentation

## 2024-04-27 NOTE — Progress Notes (Signed)
 Virtual Visit completed. Patient informed on EP and RD appointment and 6 Minute walk test. Patient also informed of patient health questionnaires on My Chart. Patient Verbalizes understanding. Visit diagnosis can be found in [facility] 04/10/2024.

## 2024-04-28 ENCOUNTER — Encounter

## 2024-04-28 VITALS — Ht 62.6 in | Wt 217.8 lb

## 2024-04-28 DIAGNOSIS — I5022 Chronic systolic (congestive) heart failure: Secondary | ICD-10-CM

## 2024-04-28 LAB — GLUCOSE, CAPILLARY: Glucose-Capillary: 264 mg/dL — ABNORMAL HIGH (ref 70–99)

## 2024-04-28 NOTE — Patient Instructions (Signed)
 Patient Instructions  Patient Details  Name: Betty Luna MRN: [id number] Date of Birth: [birth year] Referring Provider:  Gardenia Led, DO  Below are your personal goals for exercise, nutrition, and risk factors. Our goal is to help you stay on track towards obtaining and maintaining these goals. We will be discussing your progress on these goals with you throughout the program.  Initial Exercise Prescription:  Initial Exercise Prescription - 04/28/24 0900       Date of Initial Exercise RX and Referring Provider   Date 04/28/24    Referring Provider DR. Aditya Sabharwal      Oxygen   Maintain Oxygen Saturation 88% or higher      Recumbant Bike   Level 1    RPM 50    Watts 25    Minutes 15    METs 2      NuStep   Level 1    SPM 80    Minutes 15    METs 2      Arm Ergometer   Level 1    Watts 25    RPM 25    Minutes 15    METs 2      T5 Nustep   Level 1    SPM 80    Minutes 15    METs 2      Track   Laps 20    Minutes 15    METs 2.09      Prescription Details   Duration Progress to 30 minutes of continuous aerobic without signs/symptoms of physical distress      Intensity   THRR 40-80% of Max Heartrate 106-134    Ratings of Perceived Exertion 11-13    Perceived Dyspnea 0-4      Progression   Progression Continue to progress workloads to maintain intensity without signs/symptoms of physical distress.      Resistance Training   Training Prescription Yes    Weight 2lb    Reps 10-15          Exercise Goals: Frequency: Be able to perform aerobic exercise two to three times per week in program working toward 2-5 days per week of home exercise.  Intensity: Work with a perceived exertion of 11 (fairly light) - 15 (hard) while following your exercise prescription.  We will make changes to your prescription with you as you progress through the program.   Duration: Be able to do 30 to 45 minutes of continuous aerobic exercise in addition to a 5  minute warm-up and a 5 minute cool-down routine.   Nutrition Goals: Your personal nutrition goals will be established when you do your nutrition analysis with the dietician.  The following are general nutrition guidelines to follow: Cholesterol < 200mg /day Sodium < 1500mg /day Fiber: Women over 50 yrs - 21 grams per day  Personal Goals:  Personal Goals and Risk Factors at Admission - 04/27/24 1341       Core Components/Risk Factors/Patient Goals on Admission    Weight Management Yes;Weight Loss;Obesity    Intervention Weight Management: Develop a combined nutrition and exercise program designed to reach desired caloric intake, while maintaining appropriate intake of nutrient and fiber, sodium and fats, and appropriate energy expenditure required for the weight goal.;Weight Management: Provide education and appropriate resources to help participant work on and attain dietary goals.;Weight Management/Obesity: Establish reasonable short term and long term weight goals.;Obesity: Provide education and appropriate resources to help participant work on and attain dietary goals.    Expected Outcomes Short  Term: Continue to assess and modify interventions until short term weight is achieved;Weight Loss: Understanding of general recommendations for a balanced deficit meal plan, which promotes 1-2 lb weight loss per week and includes a negative energy balance of [phone] kcal/d;Understanding recommendations for meals to include 15-35% energy as protein, 25-35% energy from fat, 35-60% energy from carbohydrates, less than 200mg  of dietary cholesterol, 20-35 gm of total fiber daily;Understanding of distribution of calorie intake throughout the day with the consumption of 4-5 meals/snacks    Diabetes Yes    Intervention Provide education about signs/symptoms and action to take for hypo/hyperglycemia.;Provide education about proper nutrition, including hydration, and aerobic/resistive exercise prescription along  with prescribed medications to achieve blood glucose in normal ranges: Fasting glucose 65-99 mg/dL    Expected Outcomes Long Term: Attainment of HbA1C < 7%.;Short Term: Participant verbalizes understanding of the signs/symptoms and immediate care of hyper/hypoglycemia, proper foot care and importance of medication, aerobic/resistive exercise and nutrition plan for blood glucose control.    Heart Failure Yes    Intervention Provide a combined exercise and nutrition program that is supplemented with education, support and counseling about heart failure. Directed toward relieving symptoms such as shortness of breath, decreased exercise tolerance, and extremity edema.    Expected Outcomes Improve functional capacity of life;Short term: Attendance in program 2-3 days a week with increased exercise capacity. Reported lower sodium intake. Reported increased fruit and vegetable intake. Reports medication compliance.;Short term: Daily weights obtained and reported for increase. Utilizing diuretic protocols set by physician.;Long term: Adoption of self-care skills and reduction of barriers for early signs and symptoms recognition and intervention leading to self-care maintenance.    Hypertension Yes    Intervention Provide education on lifestyle modifcations including regular physical activity/exercise, weight management, moderate sodium restriction and increased consumption of fresh fruit, vegetables, and low fat dairy, alcohol moderation, and smoking cessation.;Monitor prescription use compliance.    Expected Outcomes Long Term: Maintenance of blood pressure at goal levels.;Short Term: Continued assessment and intervention until BP is < 140/78mm HG in hypertensive participants. < 130/64mm HG in hypertensive participants with diabetes, heart failure or chronic kidney disease.    Lipids Yes    Intervention Provide education and support for participant on nutrition & aerobic/resistive exercise along with prescribed  medications to achieve LDL 70mg , HDL >40mg .    Expected Outcomes Short Term: Participant states understanding of desired cholesterol values and is compliant with medications prescribed. Participant is following exercise prescription and nutrition guidelines.;Long Term: Cholesterol controlled with medications as prescribed, with individualized exercise RX and with personalized nutrition plan. Value goals: LDL < 70mg , HDL > 40 mg.          Exercise Goals and Review:  Exercise Goals     Row Name 04/28/24 0939             Exercise Goals   Increase Physical Activity Yes       Intervention Provide advice, education, support and counseling about physical activity/exercise needs.;Develop an individualized exercise prescription for aerobic and resistive training based on initial evaluation findings, risk stratification, comorbidities and participant's personal goals.       Expected Outcomes Short Term: Attend rehab on a regular basis to increase amount of physical activity.;Long Term: Add in home exercise to make exercise part of routine and to increase amount of physical activity.;Long Term: Exercising regularly at least 3-5 days a week.       Increase Strength and Stamina Yes       Intervention Provide  advice, education, support and counseling about physical activity/exercise needs.;Develop an individualized exercise prescription for aerobic and resistive training based on initial evaluation findings, risk stratification, comorbidities and participant's personal goals.       Expected Outcomes Short Term: Increase workloads from initial exercise prescription for resistance, speed, and METs.;Short Term: Perform resistance training exercises routinely during rehab and add in resistance training at home;Long Term: Improve cardiorespiratory fitness, muscular endurance and strength as measured by increased METs and functional capacity ( )       Able to understand and use rate of perceived exertion  (RPE) scale Yes       Intervention Provide education and explanation on how to use RPE scale       Expected Outcomes Short Term: Able to use RPE daily in rehab to express subjective intensity level;Long Term:  Able to use RPE to guide intensity level when exercising independently       Able to understand and use Dyspnea scale Yes       Intervention Provide education and explanation on how to use Dyspnea scale       Expected Outcomes Short Term: Able to use Dyspnea scale daily in rehab to express subjective sense of shortness of breath during exertion;Long Term: Able to use Dyspnea scale to guide intensity level when exercising independently       Knowledge and understanding of Target Heart Rate Range (THRR) Yes       Intervention Provide education and explanation of THRR including how the numbers were predicted and where they are located for reference       Expected Outcomes Short Term: Able to state/look up THRR;Short Term: Able to use daily as guideline for intensity in rehab;Long Term: Able to use THRR to govern intensity when exercising independently       Able to check pulse independently Yes       Intervention Provide education and demonstration on how to check pulse in carotid and radial arteries.;Review the importance of being able to check your own pulse for safety during independent exercise       Expected Outcomes Short Term: Able to explain why pulse checking is important during independent exercise;Long Term: Able to check pulse independently and accurately       Understanding of Exercise Prescription Yes       Intervention Provide education, explanation, and written materials on patient's individual exercise prescription       Expected Outcomes Long Term: Able to explain home exercise prescription to exercise independently;Short Term: Able to explain program exercise prescription          Copy of goals given to participant.

## 2024-04-28 NOTE — Progress Notes (Signed)
 Cardiac Individual Treatment Plan  Patient Details  Name: Betty Luna MRN: [id number] Date of Birth: 1950/12/08 Referring Provider:   Flowsheet Row Cardiac Rehab from 04/28/2024 in [facility] Cardiac and Pulmonary Rehab  Referring Provider DR. Aditya Sabharwal    Initial Encounter Date:  Flowsheet Row Cardiac Rehab from 04/28/2024 in [facility] Cardiac and Pulmonary Rehab  Date 04/28/24    Visit Diagnosis: Heart failure, chronic systolic (HCC)  Patient's Home Medications on Admission:  Current Outpatient Medications:    acetaminophen  (TYLENOL ) 500 MG tablet, Take 500 mg by mouth every 6 (six) hours as needed for moderate pain., Disp: , Rfl:    aspirin  EC 81 MG tablet, Take 1 tablet (81 mg total) by mouth daily., Disp: 30 tablet, Rfl: 11   carvedilol  (COREG ) 25 MG tablet, TAKE 1 TABLET(25 MG) BY MOUTH TWICE DAILY, Disp: 60 tablet, Rfl: 11   colchicine  0.6 MG tablet, Take 1 tablet (0.6 mg total) by mouth daily., Disp: , Rfl:    dapagliflozin  propanediol (FARXIGA ) 10 MG TABS tablet, Take 10 mg by mouth daily., Disp: , Rfl:    diazepam  (VALIUM ) 10 MG tablet, , Disp: , Rfl:    diclofenac Sodium (VOLTAREN) 1 % GEL, Apply topically 2 (two) times daily., Disp: , Rfl:    ergocalciferol  (VITAMIN D2) 1.25 MG (50000 UT) capsule, Take 50,000 Units by mouth once a week., Disp: , Rfl:    escitalopram  (LEXAPRO ) 10 MG tablet, Take 10 mg by mouth daily. For mood, Disp: , Rfl:    Ferrous Fumarate (HEMOCYTE - 106 MG FE) 324 (106 Fe) MG TABS tablet, Take 1 tablet by mouth daily., Disp: , Rfl:    GLUCOTROL XL 10 MG 24 hr tablet, Take 10 mg by mouth daily., Disp: , Rfl:    losartan  (COZAAR ) 25 MG tablet, Take 25 mg by mouth daily., Disp: , Rfl:    methocarbamol  (ROBAXIN ) 500 MG tablet, 2 (two) times daily as needed, Disp: , Rfl:    pantoprazole  (PROTONIX ) 40 MG tablet, Take 40 mg by mouth daily., Disp: , Rfl: 5   pregabalin  (LYRICA ) 100 MG capsule, Take 100 mg by mouth 3 (three) times daily as needed (for pain).  (Patient not taking: Reported on 04/27/2024), Disp: , Rfl:    rosuvastatin  (CRESTOR ) 20 MG tablet, Take 1 tablet (20 mg total) by mouth daily., Disp: 90 tablet, Rfl: 3   torsemide  (DEMADEX ) 10 MG tablet, TAKE 1 TABLET(10 MG) BY MOUTH EVERY DAY, Disp: , Rfl:    torsemide  (DEMADEX ) 20 MG tablet, Take 1 tablet (20 mg total) by mouth every other day. May take an additional tablet as needed for shortness of breath or swelling, Disp: , Rfl:   Past Medical History: Past Medical History:  Diagnosis Date   Anemia    Arthritis    CHF (congestive heart failure) (HCC)    Depression    Diabetes mellitus without complication (HCC)    GERD (gastroesophageal reflux disease)    Gout    Hypertension     Tobacco Use: Social History   Tobacco Use  Smoking Status Former   Current packs/day: 0.00   Types: Cigarettes   Quit date: 2006   Years since quitting: 19.5  Smokeless Tobacco Current   Types: Snuff    Labs: Review Flowsheet  More data exists      Latest Ref Rng & Units 07/20/2021 12/29/2021 02/06/2022 07/24/2023 04/10/2024  Labs for ITP Cardiac and Pulmonary Rehab  Cholestrol 0 - 200 mg/dL - 796  - - 764  LDL (calc) 0 - 99 mg/dL - 873  - - 843   HDL-C >40 mg/dL - 53  - - 37   Trlycerides <150 mg/dL - 866  - - 787   Hemoglobin A1c 4.8 - 5.6 % 7.4  - 11.5  8.1  -  Bicarbonate 20.0 - 28.0 mmol/L - - 25.4  - -  O2 Saturation % - - 91.1  - -     Exercise Target Goals: Exercise Program Goal: Individual exercise prescription set using results from initial 6 min walk test and THRR while considering  patient's activity barriers and safety.   Exercise Prescription Goal: Initial exercise prescription builds to 30-45 minutes a day of aerobic activity, 2-3 days per week.  Home exercise guidelines will be given to patient during program as part of exercise prescription that the participant will acknowledge.   Education: Aerobic Exercise: - Group verbal and visual presentation on the components of  exercise prescription. Introduces F.I.T.T principle from ACSM for exercise prescriptions.  Reviews F.I.T.T. principles of aerobic exercise including progression. Written material given at graduation.   Education: Resistance Exercise: - Group verbal and visual presentation on the components of exercise prescription. Introduces F.I.T.T principle from ACSM for exercise prescriptions  Reviews F.I.T.T. principles of resistance exercise including progression. Written material given at graduation.    Education: Exercise & Equipment Safety: - Individual verbal instruction and demonstration of equipment use and safety with use of the equipment. Flowsheet Row Cardiac Rehab from 04/28/2024 in [facility] Cardiac and Pulmonary Rehab  Date 04/28/24  Educator [facility]  Instruction Review Code 1- Verbalizes Understanding    Education: Exercise Physiology & General Exercise Guidelines: - Group verbal and written instruction with models to review the exercise physiology of the cardiovascular system and associated critical values. Provides general exercise guidelines with specific guidelines to those with heart or lung disease.    Education: Flexibility, Balance, Mind/Body Relaxation: - Group verbal and visual presentation with interactive activity on the components of exercise prescription. Introduces F.I.T.T principle from ACSM for exercise prescriptions. Reviews F.I.T.T. principles of flexibility and balance exercise training including progression. Also discusses the mind body connection.  Reviews various relaxation techniques to help reduce and manage stress (i.e. Deep breathing, progressive muscle relaxation, and visualization). Balance handout provided to take home. Written material given at graduation.   Activity Barriers & Risk Stratification:  Activity Barriers & Cardiac Risk Stratification - 04/28/24 0926       Activity Barriers & Cardiac Risk Stratification   Activity Barriers Assistive Device;Other (comment)     Comments Walks with cane    Cardiac Risk Stratification High          6 Minute Walk:  6 Minute Walk     Row Name 04/28/24 0922         6 Minute Walk   Phase Initial     Distance 600 feet     Walk Time 5.17 minutes     # of Rest Breaks 2     MPH 1.3     METS 2     RPE 13     Perceived Dyspnea  1     VO2 Peak 2.9     Symptoms Yes (comment)     Comments Bilateral hip pain 7/10     Resting HR 78 bpm     Resting BP 104/52     Resting Oxygen Saturation  97 %     Exercise Oxygen Saturation  during 6 min walk 97 %  Max Ex. HR 100 bpm     Max Ex. BP 122/54     2 Minute Post BP 116/58        Oxygen Initial Assessment:   Oxygen Re-Evaluation:   Oxygen Discharge (Final Oxygen Re-Evaluation):   Initial Exercise Prescription:  Initial Exercise Prescription - 04/28/24 0900       Date of Initial Exercise RX and Referring Provider   Date 04/28/24    Referring Provider DR. Aditya Sabharwal      Oxygen   Maintain Oxygen Saturation 88% or higher      Recumbant Bike   Level 1    RPM 50    Watts 25    Minutes 15    METs 2      NuStep   Level 1    SPM 80    Minutes 15    METs 2      Arm Ergometer   Level 1    Watts 25    RPM 25    Minutes 15    METs 2      T5 Nustep   Level 1    SPM 80    Minutes 15    METs 2      Track   Laps 20    Minutes 15    METs 2.09      Prescription Details   Duration Progress to 30 minutes of continuous aerobic without signs/symptoms of physical distress      Intensity   THRR 40-80% of Max Heartrate 106-134    Ratings of Perceived Exertion 11-13    Perceived Dyspnea 0-4      Progression   Progression Continue to progress workloads to maintain intensity without signs/symptoms of physical distress.      Resistance Training   Training Prescription Yes    Weight 2lb    Reps 10-15          Perform Capillary Blood Glucose checks as needed.  Exercise Prescription Changes:   Exercise Prescription Changes      Row Name 04/28/24 0900             Response to Exercise   Blood Pressure (Admit) 104/52       Blood Pressure (Exercise) 122/54       Blood Pressure (Exit) 116/58       Heart Rate (Admit) 78 bpm       Heart Rate (Exercise) 100 bpm       Heart Rate (Exit) 79 bpm       Oxygen Saturation (Admit) 97 %       Oxygen Saturation (Exercise) 97 %       Oxygen Saturation (Exit) 97 %       Rating of Perceived Exertion (Exercise) 13       Perceived Dyspnea (Exercise) 1       Symptoms bilateral hip pain       Comments results          Exercise Comments:   Exercise Goals and Review:   Exercise Goals     Row Name 04/28/24 0939             Exercise Goals   Increase Physical Activity Yes       Intervention Provide advice, education, support and counseling about physical activity/exercise needs.;Develop an individualized exercise prescription for aerobic and resistive training based on initial evaluation findings, risk stratification, comorbidities and participant's personal goals.       Expected Outcomes Short Term:  Attend rehab on a regular basis to increase amount of physical activity.;Long Term: Add in home exercise to make exercise part of routine and to increase amount of physical activity.;Long Term: Exercising regularly at least 3-5 days a week.       Increase Strength and Stamina Yes       Intervention Provide advice, education, support and counseling about physical activity/exercise needs.;Develop an individualized exercise prescription for aerobic and resistive training based on initial evaluation findings, risk stratification, comorbidities and participant's personal goals.       Expected Outcomes Short Term: Increase workloads from initial exercise prescription for resistance, speed, and METs.;Short Term: Perform resistance training exercises routinely during rehab and add in resistance training at home;Long Term: Improve cardiorespiratory fitness, muscular endurance and  strength as measured by increased METs and functional capacity ( )       Able to understand and use rate of perceived exertion (RPE) scale Yes       Intervention Provide education and explanation on how to use RPE scale       Expected Outcomes Short Term: Able to use RPE daily in rehab to express subjective intensity level;Long Term:  Able to use RPE to guide intensity level when exercising independently       Able to understand and use Dyspnea scale Yes       Intervention Provide education and explanation on how to use Dyspnea scale       Expected Outcomes Short Term: Able to use Dyspnea scale daily in rehab to express subjective sense of shortness of breath during exertion;Long Term: Able to use Dyspnea scale to guide intensity level when exercising independently       Knowledge and understanding of Target Heart Rate Range (THRR) Yes       Intervention Provide education and explanation of THRR including how the numbers were predicted and where they are located for reference       Expected Outcomes Short Term: Able to state/look up THRR;Short Term: Able to use daily as guideline for intensity in rehab;Long Term: Able to use THRR to govern intensity when exercising independently       Able to check pulse independently Yes       Intervention Provide education and demonstration on how to check pulse in carotid and radial arteries.;Review the importance of being able to check your own pulse for safety during independent exercise       Expected Outcomes Short Term: Able to explain why pulse checking is important during independent exercise;Long Term: Able to check pulse independently and accurately       Understanding of Exercise Prescription Yes       Intervention Provide education, explanation, and written materials on patient's individual exercise prescription       Expected Outcomes Long Term: Able to explain home exercise prescription to exercise independently;Short Term: Able to explain program  exercise prescription          Exercise Goals Re-Evaluation :   Discharge Exercise Prescription (Final Exercise Prescription Changes):  Exercise Prescription Changes - 04/28/24 0900       Response to Exercise   Blood Pressure (Admit) 104/52    Blood Pressure (Exercise) 122/54    Blood Pressure (Exit) 116/58    Heart Rate (Admit) 78 bpm    Heart Rate (Exercise) 100 bpm    Heart Rate (Exit) 79 bpm    Oxygen Saturation (Admit) 97 %    Oxygen Saturation (Exercise) 97 %    Oxygen Saturation (Exit)  97 %    Rating of Perceived Exertion (Exercise) 13    Perceived Dyspnea (Exercise) 1    Symptoms bilateral hip pain    Comments results          Nutrition:  Target Goals: Understanding of nutrition guidelines, daily intake of sodium 1500mg , cholesterol 200mg , calories 30% from fat and 7% or less from saturated fats, daily to have 5 or more servings of fruits and vegetables.  Education: All About Nutrition: -Group instruction provided by verbal, written material, interactive activities, discussions, models, and posters to present general guidelines for heart healthy nutrition including fat, fiber, MyPlate, the role of sodium in heart healthy nutrition, utilization of the nutrition label, and utilization of this knowledge for meal planning. Follow up email sent as well. Written material given at graduation.   Biometrics:  Pre Biometrics - 04/28/24 0939       Pre Biometrics   Height 5' 2.6 (1.59 m)    Weight 217 lb 12.8 oz (98.8 kg)    Waist Circumference 48.5 inches    Hip Circumference 50 inches    Waist to Hip Ratio 0.97 %    BMI (Calculated) 39.08    Single Leg Stand 1 seconds           Nutrition Therapy Plan and Nutrition Goals:   Nutrition Assessments:  MEDIFICTS Score Key: >=70 Need to make dietary changes  40-70 Heart Healthy Diet <= 40 Therapeutic Level Cholesterol Diet   Picture Your Plate Scores: <59 Unhealthy dietary pattern with much room for  improvement. 41-50 Dietary pattern unlikely to meet recommendations for good health and room for improvement. 51-60 More healthful dietary pattern, with some room for improvement.  >60 Healthy dietary pattern, although there may be some specific behaviors that could be improved.    Nutrition Goals Re-Evaluation:   Nutrition Goals Discharge (Final Nutrition Goals Re-Evaluation):   Psychosocial: Target Goals: Acknowledge presence or absence of significant depression and/or stress, maximize coping skills, provide positive support system. Participant is able to verbalize types and ability to use techniques and skills needed for reducing stress and depression.   Education: Stress, Anxiety, and Depression - Group verbal and visual presentation to define topics covered.  Reviews how body is impacted by stress, anxiety, and depression.  Also discusses healthy ways to reduce stress and to treat/manage anxiety and depression.  Written material given at graduation.   Education: Sleep Hygiene -Provides group verbal and written instruction about how sleep can affect your health.  Define sleep hygiene, discuss sleep cycles and impact of sleep habits. Review good sleep hygiene tips.    Initial Review & Psychosocial Screening:  Initial Psych Review & Screening - 04/27/24 1341       Initial Review   Current issues with Current Anxiety/Panic;Current Psychotropic Meds      Family Dynamics   Good Support System? Yes    Comments Her anxiety is just there and does not come from anything in particular. She can look to her husband, daughter and grand daughter.      Barriers   Psychosocial barriers to participate in program The patient should benefit from training in stress management and relaxation.      Screening Interventions   Interventions Provide feedback about the scores to participant;To provide support and resources with identified psychosocial needs;Encouraged to exercise    Expected Outcomes  Short Term goal: Utilizing psychosocial counselor, staff and physician to assist with identification of specific Stressors or current issues interfering with healing process.  Setting desired goal for each stressor or current issue identified.;Short Term goal: Identification and review with participant of any Quality of Life or Depression concerns found by scoring the questionnaire.;Long Term goal: The participant improves quality of Life and PHQ9 Scores as seen by post scores and/or verbalization of changes;Long Term Goal: Stressors or current issues are controlled or eliminated.          Quality of Life Scores:   Scores of 19 and below usually indicate a poorer quality of life in these areas.  A difference of  2-3 points is a clinically meaningful difference.  A difference of 2-3 points in the total score of the Quality of Life Index has been associated with significant improvement in overall quality of life, self-image, physical symptoms, and general health in studies assessing change in quality of life.  PHQ-9: Review Flowsheet       04/28/2024 12/14/2022  Depression screen PHQ 2/9  Decreased Interest 1 1  Down, Depressed, Hopeless 0 1  PHQ - 2 Score 1 2  Altered sleeping 3 -  Tired, decreased energy 1 -  Change in appetite 2 -  Trouble concentrating 0 -  Moving slowly or fidgety/restless 0 -  Suicidal thoughts 0 -  PHQ-9 Score 7 -  Difficult doing work/chores Not difficult at all -   Interpretation of Total Score  Total Score Depression Severity:  1-4 = Minimal depression, 5-9 = Mild depression, 10-14 = Moderate depression, 15-19 = Moderately severe depression, 20-27 = Severe depression   Psychosocial Evaluation and Intervention:  Psychosocial Evaluation - 04/27/24 1343       Psychosocial Evaluation & Interventions   Interventions Encouraged to exercise with the program and follow exercise prescription;Relaxation education;Stress management education    Comments Her anxiety is  just there and does not come from anything in particular. She can look to her husband, daughter and grand daughter.    Expected Outcomes Short: Start [facility] to help with mood. Long: Maintain a healthy mental state    Continue Psychosocial Services  Follow up required by staff          Psychosocial Re-Evaluation:   Psychosocial Discharge (Final Psychosocial Re-Evaluation):   Vocational Rehabilitation: Provide vocational rehab assistance to qualifying candidates.   Vocational Rehab Evaluation & Intervention:   Education: Education Goals: Education classes will be provided on a variety of topics geared toward better understanding of heart health and risk factor modification. Participant will state understanding/return demonstration of topics presented as noted by education test scores.  Learning Barriers/Preferences:  Learning Barriers/Preferences - 04/27/24 1341       Learning Barriers/Preferences   Learning Barriers None    Learning Preferences None          General Cardiac Education Topics:  AED/CPR: - Group verbal and written instruction with the use of models to demonstrate the basic use of the AED with the basic ABC's of resuscitation.   Anatomy and Cardiac Procedures: - Group verbal and visual presentation and models provide information about basic cardiac anatomy and function. Reviews the testing methods done to diagnose heart disease and the outcomes of the test results. Describes the treatment choices: Medical Management, Angioplasty, or Coronary Bypass Surgery for treating various heart conditions including Myocardial Infarction, Angina, Valve Disease, and Cardiac Arrhythmias.  Written material given at graduation.   Medication Safety: - Group verbal and visual instruction to review commonly prescribed medications for heart and lung disease. Reviews the medication, class of the drug, and side effects. Includes the  steps to properly store meds and maintain the  prescription regimen.  Written material given at graduation.   Intimacy: - Group verbal instruction through game format to discuss how heart and lung disease can affect sexual intimacy. Written material given at graduation..   Know Your Numbers and Heart Failure: - Group verbal and visual instruction to discuss disease risk factors for cardiac and pulmonary disease and treatment options.  Reviews associated critical values for Overweight/Obesity, Hypertension, Cholesterol, and Diabetes.  Discusses basics of heart failure: signs/symptoms and treatments.  Introduces Heart Failure Zone chart for action plan for heart failure.  Written material given at graduation.   Infection Prevention: - Provides verbal and written material to individual with discussion of infection control including proper hand washing and proper equipment cleaning during exercise session. Flowsheet Row Cardiac Rehab from 04/28/2024 in [facility] Cardiac and Pulmonary Rehab  Date 04/28/24  Educator [facility]  Instruction Review Code 1- Verbalizes Understanding    Falls Prevention: - Provides verbal and written material to individual with discussion of falls prevention and safety. Flowsheet Row Cardiac Rehab from 04/28/2024 in [facility] Cardiac and Pulmonary Rehab  Date 04/28/24  Educator [facility]  Instruction Review Code 1- Verbalizes Understanding    Other: -Provides group and verbal instruction on various topics (see comments)   Knowledge Questionnaire Score:   Core Components/Risk Factors/Patient Goals at Admission:  Personal Goals and Risk Factors at Admission - 04/27/24 1341       Core Components/Risk Factors/Patient Goals on Admission    Weight Management Yes;Weight Loss;Obesity    Intervention Weight Management: Develop a combined nutrition and exercise program designed to reach desired caloric intake, while maintaining appropriate intake of nutrient and fiber, sodium and fats, and appropriate energy expenditure required for the  weight goal.;Weight Management: Provide education and appropriate resources to help participant work on and attain dietary goals.;Weight Management/Obesity: Establish reasonable short term and long term weight goals.;Obesity: Provide education and appropriate resources to help participant work on and attain dietary goals.    Expected Outcomes Short Term: Continue to assess and modify interventions until short term weight is achieved;Weight Loss: Understanding of general recommendations for a balanced deficit meal plan, which promotes 1-2 lb weight loss per week and includes a negative energy balance of [phone] kcal/d;Understanding recommendations for meals to include 15-35% energy as protein, 25-35% energy from fat, 35-60% energy from carbohydrates, less than 200mg  of dietary cholesterol, 20-35 gm of total fiber daily;Understanding of distribution of calorie intake throughout the day with the consumption of 4-5 meals/snacks    Diabetes Yes    Intervention Provide education about signs/symptoms and action to take for hypo/hyperglycemia.;Provide education about proper nutrition, including hydration, and aerobic/resistive exercise prescription along with prescribed medications to achieve blood glucose in normal ranges: Fasting glucose 65-99 mg/dL    Expected Outcomes Long Term: Attainment of HbA1C < 7%.;Short Term: Participant verbalizes understanding of the signs/symptoms and immediate care of hyper/hypoglycemia, proper foot care and importance of medication, aerobic/resistive exercise and nutrition plan for blood glucose control.    Heart Failure Yes    Intervention Provide a combined exercise and nutrition program that is supplemented with education, support and counseling about heart failure. Directed toward relieving symptoms such as shortness of breath, decreased exercise tolerance, and extremity edema.    Expected Outcomes Improve functional capacity of life;Short term: Attendance in program 2-3 days a  week with increased exercise capacity. Reported lower sodium intake. Reported increased fruit and vegetable intake. Reports medication compliance.;Short term: Daily weights obtained and  reported for increase. Utilizing diuretic protocols set by physician.;Long term: Adoption of self-care skills and reduction of barriers for early signs and symptoms recognition and intervention leading to self-care maintenance.    Hypertension Yes    Intervention Provide education on lifestyle modifcations including regular physical activity/exercise, weight management, moderate sodium restriction and increased consumption of fresh fruit, vegetables, and low fat dairy, alcohol moderation, and smoking cessation.;Monitor prescription use compliance.    Expected Outcomes Long Term: Maintenance of blood pressure at goal levels.;Short Term: Continued assessment and intervention until BP is < 140/7mm HG in hypertensive participants. < 130/48mm HG in hypertensive participants with diabetes, heart failure or chronic kidney disease.    Lipids Yes    Intervention Provide education and support for participant on nutrition & aerobic/resistive exercise along with prescribed medications to achieve LDL 70mg , HDL >40mg .    Expected Outcomes Short Term: Participant states understanding of desired cholesterol values and is compliant with medications prescribed. Participant is following exercise prescription and nutrition guidelines.;Long Term: Cholesterol controlled with medications as prescribed, with individualized exercise RX and with personalized nutrition plan. Value goals: LDL < 70mg , HDL > 40 mg.          Education:Diabetes - Individual verbal and written instruction to review signs/symptoms of diabetes, desired ranges of glucose level fasting, after meals and with exercise. Acknowledge that pre and post exercise glucose checks will be done for 3 sessions at entry of program. Flowsheet Row Cardiac Rehab from 04/28/2024 in [facility]  Cardiac and Pulmonary Rehab  Date 04/28/24  Educator [facility]  Instruction Review Code 1- Verbalizes Understanding    Core Components/Risk Factors/Patient Goals Review:    Core Components/Risk Factors/Patient Goals at Discharge (Final Review):    ITP Comments:  ITP Comments     Row Name 04/27/24 1346 04/28/24 0921         ITP Comments Virtual Visit completed. Patient informed on EP and RD appointment and 6 Minute walk test. Patient also informed of patient health questionnaires on My Chart. Patient Verbalizes understanding. Visit diagnosis can be found in [facility] 04/10/2024. Completed and gym orientation for cardiac rehab. Initial ITP created and sent for review to Dr. Oneil Pinal, Medical Director.         Comments: Initial ITP

## 2024-05-05 ENCOUNTER — Encounter

## 2024-05-05 DIAGNOSIS — I5022 Chronic systolic (congestive) heart failure: Secondary | ICD-10-CM

## 2024-05-05 LAB — GLUCOSE, CAPILLARY
Glucose-Capillary: 234 mg/dL — ABNORMAL HIGH (ref 70–99)
Glucose-Capillary: 205 mg/dL — ABNORMAL HIGH (ref 70–99)

## 2024-05-05 NOTE — Progress Notes (Signed)
 Daily Session Note  Patient Details  Name: Betty Luna MRN: [id number] Date of Birth: 06-02-1951 Referring Provider:   Flowsheet Row Cardiac Rehab from 04/28/2024 in [facility] Cardiac and Pulmonary Rehab  Referring Provider DR. Ria Commander    Encounter Date: 05/05/2024  Check In:  Session Check In - 05/05/24 1125       Check-In   Supervising physician immediately available to respond to emergencies See telemetry face sheet for immediately available ER MD    Location [facility]-Cardiac & Pulmonary Rehab    Staff Present Burnard Davenport RN,BSN,MPA;Margaret Best, MS, Exercise Physiologist;Jason Elnor RDN,LDN;Laureen Delores, BS, RRT, CPFT    Virtual Visit No    Medication changes reported     No    Fall or balance concerns reported    No    Tobacco Cessation No Change    Warm-up and Cool-down Performed on first and last piece of equipment    Resistance Training Performed Yes    VAD Patient? No    PAD/SET Patient? No      Pain Assessment   Currently in Pain? No/denies             Social History   Tobacco Use  Smoking Status Former   Current packs/day: 0.00   Types: Cigarettes   Quit date: 2006   Years since quitting: 19.5  Smokeless Tobacco Current   Types: Snuff    Goals Met:  Independence with exercise equipment Exercise tolerated well No report of concerns or symptoms today Strength training completed today  Goals Unmet:  Not Applicable  Comments: First full day of exercise!  Patient was oriented to gym and equipment including functions, settings, policies, and procedures.  Patient's individual exercise prescription and treatment plan were reviewed.  All starting workloads were established based on the results of the 6 minute walk test done at initial orientation visit.  The plan for exercise progression was also introduced and progression will be customized based on patient's performance and goals.     Dr. Oneil Pinal is Medical Director for [facility] Cardiac  Rehabilitation.  Dr. Fuad Aleskerov is Medical Director for [facility] Pulmonary Rehabilitation.

## 2024-05-07 ENCOUNTER — Encounter

## 2024-05-12 ENCOUNTER — Ambulatory Visit: Payer: 59

## 2024-05-12 ENCOUNTER — Telehealth: Payer: Self-pay

## 2024-05-12 ENCOUNTER — Encounter

## 2024-05-12 ENCOUNTER — Encounter: Payer: Self-pay | Admitting: *Deleted

## 2024-05-12 DIAGNOSIS — I428 Other cardiomyopathies: Secondary | ICD-10-CM

## 2024-05-12 DIAGNOSIS — I5022 Chronic systolic (congestive) heart failure: Secondary | ICD-10-CM

## 2024-05-12 NOTE — Telephone Encounter (Signed)
 Spoke with Betty Luna this morning to confirm her interest of discharging from the program. She said that she would like to be discharged from the program due to muscular pain.

## 2024-05-12 NOTE — Progress Notes (Signed)
 Cardiac Individual Treatment Plan  Patient Details  Name: Betty Luna MRN: [id number] Date of Birth: 20-Dec-1950 Referring Provider:   Flowsheet Row Cardiac Rehab from 04/28/2024 in [facility] Cardiac and Pulmonary Rehab  Referring Provider DR. Aditya Sabharwal    Initial Encounter Date:  Flowsheet Row Cardiac Rehab from 04/28/2024 in [facility] Cardiac and Pulmonary Rehab  Date 04/28/24    Visit Diagnosis: Heart failure, chronic systolic (HCC)  Patient's Home Medications on Admission:  Current Outpatient Medications:    acetaminophen  (TYLENOL ) 500 MG tablet, Take 500 mg by mouth every 6 (six) hours as needed for moderate pain., Disp: , Rfl:    aspirin  EC 81 MG tablet, Take 1 tablet (81 mg total) by mouth daily., Disp: 30 tablet, Rfl: 11   carvedilol  (COREG ) 25 MG tablet, TAKE 1 TABLET(25 MG) BY MOUTH TWICE DAILY, Disp: 60 tablet, Rfl: 11   colchicine  0.6 MG tablet, Take 1 tablet (0.6 mg total) by mouth daily., Disp: , Rfl:    dapagliflozin  propanediol (FARXIGA ) 10 MG TABS tablet, Take 10 mg by mouth daily., Disp: , Rfl:    diazepam  (VALIUM ) 10 MG tablet, , Disp: , Rfl:    diclofenac Sodium (VOLTAREN) 1 % GEL, Apply topically 2 (two) times daily., Disp: , Rfl:    ergocalciferol  (VITAMIN D2) 1.25 MG (50000 UT) capsule, Take 50,000 Units by mouth once a week., Disp: , Rfl:    escitalopram  (LEXAPRO ) 10 MG tablet, Take 10 mg by mouth daily. For mood, Disp: , Rfl:    Ferrous Fumarate (HEMOCYTE - 106 MG FE) 324 (106 Fe) MG TABS tablet, Take 1 tablet by mouth daily., Disp: , Rfl:    GLUCOTROL XL 10 MG 24 hr tablet, Take 10 mg by mouth daily., Disp: , Rfl:    losartan  (COZAAR ) 25 MG tablet, Take 25 mg by mouth daily., Disp: , Rfl:    methocarbamol  (ROBAXIN ) 500 MG tablet, 2 (two) times daily as needed, Disp: , Rfl:    pantoprazole  (PROTONIX ) 40 MG tablet, Take 40 mg by mouth daily., Disp: , Rfl: 5   pregabalin  (LYRICA ) 100 MG capsule, Take 100 mg by mouth 3 (three) times daily as needed (for pain).  (Patient not taking: Reported on 04/27/2024), Disp: , Rfl:    rosuvastatin  (CRESTOR ) 20 MG tablet, Take 1 tablet (20 mg total) by mouth daily., Disp: 90 tablet, Rfl: 3   torsemide  (DEMADEX ) 10 MG tablet, TAKE 1 TABLET(10 MG) BY MOUTH EVERY DAY, Disp: , Rfl:    torsemide  (DEMADEX ) 20 MG tablet, Take 1 tablet (20 mg total) by mouth every other day. May take an additional tablet as needed for shortness of breath or swelling, Disp: , Rfl:   Past Medical History: Past Medical History:  Diagnosis Date   Anemia    Arthritis    CHF (congestive heart failure) (HCC)    Depression    Diabetes mellitus without complication (HCC)    GERD (gastroesophageal reflux disease)    Gout    Hypertension     Tobacco Use: Social History   Tobacco Use  Smoking Status Former   Current packs/day: 0.00   Types: Cigarettes   Quit date: 2006   Years since quitting: 19.5  Smokeless Tobacco Current   Types: Snuff    Labs: Review Flowsheet  More data exists      Latest Ref Rng & Units 07/20/2021 12/29/2021 02/06/2022 07/24/2023 04/10/2024  Labs for ITP Cardiac and Pulmonary Rehab  Cholestrol 0 - 200 mg/dL - 796  - - 764  LDL (calc) 0 - 99 mg/dL - 873  - - 843   HDL-C >40 mg/dL - 53  - - 37   Trlycerides <150 mg/dL - 866  - - 787   Hemoglobin A1c 4.8 - 5.6 % 7.4  - 11.5  8.1  -  Bicarbonate 20.0 - 28.0 mmol/L - - 25.4  - -  O2 Saturation % - - 91.1  - -     Exercise Target Goals: Exercise Program Goal: Individual exercise prescription set using results from initial 6 min walk test and THRR while considering  patient's activity barriers and safety.   Exercise Prescription Goal: Initial exercise prescription builds to 30-45 minutes a day of aerobic activity, 2-3 days per week.  Home exercise guidelines will be given to patient during program as part of exercise prescription that the participant will acknowledge.   Education: Aerobic Exercise: - Group verbal and visual presentation on the components of  exercise prescription. Introduces F.I.T.T principle from ACSM for exercise prescriptions.  Reviews F.I.T.T. principles of aerobic exercise including progression. Written material given at graduation. Flowsheet Row Cardiac Rehab from 05/05/2024 in [facility] Cardiac and Pulmonary Rehab  Education need identified 05/06/24    Education: Resistance Exercise: - Group verbal and visual presentation on the components of exercise prescription. Introduces F.I.T.T principle from ACSM for exercise prescriptions  Reviews F.I.T.T. principles of resistance exercise including progression. Written material given at graduation.    Education: Exercise & Equipment Safety: - Individual verbal instruction and demonstration of equipment use and safety with use of the equipment. Flowsheet Row Cardiac Rehab from 04/28/2024 in [facility] Cardiac and Pulmonary Rehab  Date 04/28/24  Educator [facility]  Instruction Review Code 1- Verbalizes Understanding    Education: Exercise Physiology & General Exercise Guidelines: - Group verbal and written instruction with models to review the exercise physiology of the cardiovascular system and associated critical values. Provides general exercise guidelines with specific guidelines to those with heart or lung disease.  Flowsheet Row Cardiac Rehab from 05/05/2024 in [facility] Cardiac and Pulmonary Rehab  Education need identified 05/06/24    Education: Flexibility, Balance, Mind/Body Relaxation: - Group verbal and visual presentation with interactive activity on the components of exercise prescription. Introduces F.I.T.T principle from ACSM for exercise prescriptions. Reviews F.I.T.T. principles of flexibility and balance exercise training including progression. Also discusses the mind body connection.  Reviews various relaxation techniques to help reduce and manage stress (i.e. Deep breathing, progressive muscle relaxation, and visualization). Balance handout provided to take home. Written material given at  graduation.   Activity Barriers & Risk Stratification:  Activity Barriers & Cardiac Risk Stratification - 04/28/24 0926       Activity Barriers & Cardiac Risk Stratification   Activity Barriers Assistive Device;Other (comment)    Comments Walks with cane    Cardiac Risk Stratification High          6 Minute Walk:  6 Minute Walk     Row Name 04/28/24 0922         6 Minute Walk   Phase Initial     Distance 600 feet     Walk Time 5.17 minutes     # of Rest Breaks 2     MPH 1.3     METS 2     RPE 13     Perceived Dyspnea  1     VO2 Peak 2.9     Symptoms Yes (comment)     Comments Bilateral hip pain 7/10  Resting HR 78 bpm     Resting BP 104/52     Resting Oxygen Saturation  97 %     Exercise Oxygen Saturation  during 6 min walk 97 %     Max Ex. HR 100 bpm     Max Ex. BP 122/54     2 Minute Post BP 116/58        Oxygen Initial Assessment:   Oxygen Re-Evaluation:   Oxygen Discharge (Final Oxygen Re-Evaluation):   Initial Exercise Prescription:  Initial Exercise Prescription - 04/28/24 0900       Date of Initial Exercise RX and Referring Provider   Date 04/28/24    Referring Provider DR. Aditya Sabharwal      Oxygen   Maintain Oxygen Saturation 88% or higher      Recumbant Bike   Level 1    RPM 50    Watts 25    Minutes 15    METs 2      NuStep   Level 1    SPM 80    Minutes 15    METs 2      Arm Ergometer   Level 1    Watts 25    RPM 25    Minutes 15    METs 2      T5 Nustep   Level 1    SPM 80    Minutes 15    METs 2      Track   Laps 20    Minutes 15    METs 2.09      Prescription Details   Duration Progress to 30 minutes of continuous aerobic without signs/symptoms of physical distress      Intensity   THRR 40-80% of Max Heartrate 106-134    Ratings of Perceived Exertion 11-13    Perceived Dyspnea 0-4      Progression   Progression Continue to progress workloads to maintain intensity without signs/symptoms of  physical distress.      Resistance Training   Training Prescription Yes    Weight 2lb    Reps 10-15          Perform Capillary Blood Glucose checks as needed.  Exercise Prescription Changes:   Exercise Prescription Changes     Row Name 04/28/24 0900             Response to Exercise   Blood Pressure (Admit) 104/52       Blood Pressure (Exercise) 122/54       Blood Pressure (Exit) 116/58       Heart Rate (Admit) 78 bpm       Heart Rate (Exercise) 100 bpm       Heart Rate (Exit) 79 bpm       Oxygen Saturation (Admit) 97 %       Oxygen Saturation (Exercise) 97 %       Oxygen Saturation (Exit) 97 %       Rating of Perceived Exertion (Exercise) 13       Perceived Dyspnea (Exercise) 1       Symptoms bilateral hip pain       Comments results          Exercise Comments:   Exercise Comments     Row Name 05/05/24 1126           Exercise Comments First full day of exercise!  Patient was oriented to gym and equipment including functions, settings, policies, and procedures.  Patient's individual exercise  prescription and treatment plan were reviewed.  All starting workloads were established based on the results of the 6 minute walk test done at initial orientation visit.  The plan for exercise progression was also introduced and progression will be customized based on patient's performance and goals.          Exercise Goals and Review:   Exercise Goals     Row Name 04/28/24 0939             Exercise Goals   Increase Physical Activity Yes       Intervention Provide advice, education, support and counseling about physical activity/exercise needs.;Develop an individualized exercise prescription for aerobic and resistive training based on initial evaluation findings, risk stratification, comorbidities and participant's personal goals.       Expected Outcomes Short Term: Attend rehab on a regular basis to increase amount of physical activity.;Long Term: Add in home  exercise to make exercise part of routine and to increase amount of physical activity.;Long Term: Exercising regularly at least 3-5 days a week.       Increase Strength and Stamina Yes       Intervention Provide advice, education, support and counseling about physical activity/exercise needs.;Develop an individualized exercise prescription for aerobic and resistive training based on initial evaluation findings, risk stratification, comorbidities and participant's personal goals.       Expected Outcomes Short Term: Increase workloads from initial exercise prescription for resistance, speed, and METs.;Short Term: Perform resistance training exercises routinely during rehab and add in resistance training at home;Long Term: Improve cardiorespiratory fitness, muscular endurance and strength as measured by increased METs and functional capacity ( )       Able to understand and use rate of perceived exertion (RPE) scale Yes       Intervention Provide education and explanation on how to use RPE scale       Expected Outcomes Short Term: Able to use RPE daily in rehab to express subjective intensity level;Long Term:  Able to use RPE to guide intensity level when exercising independently       Able to understand and use Dyspnea scale Yes       Intervention Provide education and explanation on how to use Dyspnea scale       Expected Outcomes Short Term: Able to use Dyspnea scale daily in rehab to express subjective sense of shortness of breath during exertion;Long Term: Able to use Dyspnea scale to guide intensity level when exercising independently       Knowledge and understanding of Target Heart Rate Range (THRR) Yes       Intervention Provide education and explanation of THRR including how the numbers were predicted and where they are located for reference       Expected Outcomes Short Term: Able to state/look up THRR;Short Term: Able to use daily as guideline for intensity in rehab;Long Term: Able to use  THRR to govern intensity when exercising independently       Able to check pulse independently Yes       Intervention Provide education and demonstration on how to check pulse in carotid and radial arteries.;Review the importance of being able to check your own pulse for safety during independent exercise       Expected Outcomes Short Term: Able to explain why pulse checking is important during independent exercise;Long Term: Able to check pulse independently and accurately       Understanding of Exercise Prescription Yes       Intervention Provide  education, explanation, and written materials on patient's individual exercise prescription       Expected Outcomes Long Term: Able to explain home exercise prescription to exercise independently;Short Term: Able to explain program exercise prescription          Exercise Goals Re-Evaluation :  Exercise Goals Re-Evaluation     Row Name 05/05/24 1126             Exercise Goal Re-Evaluation   Exercise Goals Review Increase Physical Activity;Able to understand and use rate of perceived exertion (RPE) scale;Knowledge and understanding of Target Heart Rate Range (THRR);Understanding of Exercise Prescription;Increase Strength and Stamina;Able to understand and use Dyspnea scale;Able to check pulse independently       Comments Reviewed RPE and dyspnea scale, THR and program prescription with pt today.  Pt voiced understanding and was given a copy of goals to take home.       Expected Outcomes Short: Use RPE daily to regulate intensity. Long: Follow program prescription in THR.          Discharge Exercise Prescription (Final Exercise Prescription Changes):  Exercise Prescription Changes - 04/28/24 0900       Response to Exercise   Blood Pressure (Admit) 104/52    Blood Pressure (Exercise) 122/54    Blood Pressure (Exit) 116/58    Heart Rate (Admit) 78 bpm    Heart Rate (Exercise) 100 bpm    Heart Rate (Exit) 79 bpm    Oxygen Saturation (Admit)  97 %    Oxygen Saturation (Exercise) 97 %    Oxygen Saturation (Exit) 97 %    Rating of Perceived Exertion (Exercise) 13    Perceived Dyspnea (Exercise) 1    Symptoms bilateral hip pain    Comments results          Nutrition:  Target Goals: Understanding of nutrition guidelines, daily intake of sodium 1500mg , cholesterol 200mg , calories 30% from fat and 7% or less from saturated fats, daily to have 5 or more servings of fruits and vegetables.  Education: All About Nutrition: -Group instruction provided by verbal, written material, interactive activities, discussions, models, and posters to present general guidelines for heart healthy nutrition including fat, fiber, MyPlate, the role of sodium in heart healthy nutrition, utilization of the nutrition label, and utilization of this knowledge for meal planning. Follow up email sent as well. Written material given at graduation. Flowsheet Row Cardiac Rehab from 05/05/2024 in [facility] Cardiac and Pulmonary Rehab  Education need identified 05/06/24    Biometrics:  Pre Biometrics - 04/28/24 0939       Pre Biometrics   Height 5' 2.6 (1.59 m)    Weight 217 lb 12.8 oz (98.8 kg)    Waist Circumference 48.5 inches    Hip Circumference 50 inches    Waist to Hip Ratio 0.97 %    BMI (Calculated) 39.08    Single Leg Stand 1 seconds           Nutrition Therapy Plan and Nutrition Goals:   Nutrition Assessments:  MEDIFICTS Score Key: >=70 Need to make dietary changes  40-70 Heart Healthy Diet <= 40 Therapeutic Level Cholesterol Diet   Picture Your Plate Scores: <59 Unhealthy dietary pattern with much room for improvement. 41-50 Dietary pattern unlikely to meet recommendations for good health and room for improvement. 51-60 More healthful dietary pattern, with some room for improvement.  >60 Healthy dietary pattern, although there may be some specific behaviors that could be improved.    Nutrition Goals  Re-Evaluation:   Nutrition Goals Discharge (Final Nutrition Goals Re-Evaluation):   Psychosocial: Target Goals: Acknowledge presence or absence of significant depression and/or stress, maximize coping skills, provide positive support system. Participant is able to verbalize types and ability to use techniques and skills needed for reducing stress and depression.   Education: Stress, Anxiety, and Depression - Group verbal and visual presentation to define topics covered.  Reviews how body is impacted by stress, anxiety, and depression.  Also discusses healthy ways to reduce stress and to treat/manage anxiety and depression.  Written material given at graduation.   Education: Sleep Hygiene -Provides group verbal and written instruction about how sleep can affect your health.  Define sleep hygiene, discuss sleep cycles and impact of sleep habits. Review good sleep hygiene tips.    Initial Review & Psychosocial Screening:  Initial Psych Review & Screening - 04/27/24 1341       Initial Review   Current issues with Current Anxiety/Panic;Current Psychotropic Meds      Family Dynamics   Good Support System? Yes    Comments Her anxiety is just there and does not come from anything in particular. She can look to her husband, daughter and grand daughter.      Barriers   Psychosocial barriers to participate in program The patient should benefit from training in stress management and relaxation.      Screening Interventions   Interventions Provide feedback about the scores to participant;To provide support and resources with identified psychosocial needs;Encouraged to exercise    Expected Outcomes Short Term goal: Utilizing psychosocial counselor, staff and physician to assist with identification of specific Stressors or current issues interfering with healing process. Setting desired goal for each stressor or current issue identified.;Short Term goal: Identification and review with participant  of any Quality of Life or Depression concerns found by scoring the questionnaire.;Long Term goal: The participant improves quality of Life and PHQ9 Scores as seen by post scores and/or verbalization of changes;Long Term Goal: Stressors or current issues are controlled or eliminated.          Quality of Life Scores:   Scores of 19 and below usually indicate a poorer quality of life in these areas.  A difference of  2-3 points is a clinically meaningful difference.  A difference of 2-3 points in the total score of the Quality of Life Index has been associated with significant improvement in overall quality of life, self-image, physical symptoms, and general health in studies assessing change in quality of life.  PHQ-9: Review Flowsheet       04/28/2024 12/14/2022  Depression screen PHQ 2/9  Decreased Interest 1 1  Down, Depressed, Hopeless 0 1  PHQ - 2 Score 1 2  Altered sleeping 3 -  Tired, decreased energy 1 -  Change in appetite 2 -  Trouble concentrating 0 -  Moving slowly or fidgety/restless 0 -  Suicidal thoughts 0 -  PHQ-9 Score 7 -  Difficult doing work/chores Not difficult at all -   Interpretation of Total Score  Total Score Depression Severity:  1-4 = Minimal depression, 5-9 = Mild depression, 10-14 = Moderate depression, 15-19 = Moderately severe depression, 20-27 = Severe depression   Psychosocial Evaluation and Intervention:  Psychosocial Evaluation - 04/27/24 1343       Psychosocial Evaluation & Interventions   Interventions Encouraged to exercise with the program and follow exercise prescription;Relaxation education;Stress management education    Comments Her anxiety is just there and does not come from anything  in particular. She can look to her husband, daughter and grand daughter.    Expected Outcomes Short: Start [facility] to help with mood. Long: Maintain a healthy mental state    Continue Psychosocial Services  Follow up required by staff           Psychosocial Re-Evaluation:   Psychosocial Discharge (Final Psychosocial Re-Evaluation):   Vocational Rehabilitation: Provide vocational rehab assistance to qualifying candidates.   Vocational Rehab Evaluation & Intervention:   Education: Education Goals: Education classes will be provided on a variety of topics geared toward better understanding of heart health and risk factor modification. Participant will state understanding/return demonstration of topics presented as noted by education test scores.  Learning Barriers/Preferences:  Learning Barriers/Preferences - 04/27/24 1341       Learning Barriers/Preferences   Learning Barriers None    Learning Preferences None          General Cardiac Education Topics:  AED/CPR: - Group verbal and written instruction with the use of models to demonstrate the basic use of the AED with the basic ABC's of resuscitation.   Anatomy and Cardiac Procedures: - Group verbal and visual presentation and models provide information about basic cardiac anatomy and function. Reviews the testing methods done to diagnose heart disease and the outcomes of the test results. Describes the treatment choices: Medical Management, Angioplasty, or Coronary Bypass Surgery for treating various heart conditions including Myocardial Infarction, Angina, Valve Disease, and Cardiac Arrhythmias.  Written material given at graduation.   Medication Safety: - Group verbal and visual instruction to review commonly prescribed medications for heart and lung disease. Reviews the medication, class of the drug, and side effects. Includes the steps to properly store meds and maintain the prescription regimen.  Written material given at graduation.   Intimacy: - Group verbal instruction through game format to discuss how heart and lung disease can affect sexual intimacy. Written material given at graduation..   Know Your Numbers and Heart Failure: - Group verbal and  visual instruction to discuss disease risk factors for cardiac and pulmonary disease and treatment options.  Reviews associated critical values for Overweight/Obesity, Hypertension, Cholesterol, and Diabetes.  Discusses basics of heart failure: signs/symptoms and treatments.  Introduces Heart Failure Zone chart for action plan for heart failure.  Written material given at graduation.   Infection Prevention: - Provides verbal and written material to individual with discussion of infection control including proper hand washing and proper equipment cleaning during exercise session. Flowsheet Row Cardiac Rehab from 04/28/2024 in [facility] Cardiac and Pulmonary Rehab  Date 04/28/24  Educator [facility]  Instruction Review Code 1- Verbalizes Understanding    Falls Prevention: - Provides verbal and written material to individual with discussion of falls prevention and safety. Flowsheet Row Cardiac Rehab from 04/28/2024 in [facility] Cardiac and Pulmonary Rehab  Date 04/28/24  Educator [facility]  Instruction Review Code 1- Verbalizes Understanding    Other: -Provides group and verbal instruction on various topics (see comments)   Knowledge Questionnaire Score:  Knowledge Questionnaire Score - 05/06/24 1343       Knowledge Questionnaire Score   Pre Score 19/26          Core Components/Risk Factors/Patient Goals at Admission:  Personal Goals and Risk Factors at Admission - 04/27/24 1341       Core Components/Risk Factors/Patient Goals on Admission    Weight Management Yes;Weight Loss;Obesity    Intervention Weight Management: Develop a combined nutrition and exercise program designed to reach desired caloric intake, while maintaining  appropriate intake of nutrient and fiber, sodium and fats, and appropriate energy expenditure required for the weight goal.;Weight Management: Provide education and appropriate resources to help participant work on and attain dietary goals.;Weight Management/Obesity: Establish  reasonable short term and long term weight goals.;Obesity: Provide education and appropriate resources to help participant work on and attain dietary goals.    Expected Outcomes Short Term: Continue to assess and modify interventions until short term weight is achieved;Weight Loss: Understanding of general recommendations for a balanced deficit meal plan, which promotes 1-2 lb weight loss per week and includes a negative energy balance of [phone] kcal/d;Understanding recommendations for meals to include 15-35% energy as protein, 25-35% energy from fat, 35-60% energy from carbohydrates, less than 200mg  of dietary cholesterol, 20-35 gm of total fiber daily;Understanding of distribution of calorie intake throughout the day with the consumption of 4-5 meals/snacks    Diabetes Yes    Intervention Provide education about signs/symptoms and action to take for hypo/hyperglycemia.;Provide education about proper nutrition, including hydration, and aerobic/resistive exercise prescription along with prescribed medications to achieve blood glucose in normal ranges: Fasting glucose 65-99 mg/dL    Expected Outcomes Long Term: Attainment of HbA1C < 7%.;Short Term: Participant verbalizes understanding of the signs/symptoms and immediate care of hyper/hypoglycemia, proper foot care and importance of medication, aerobic/resistive exercise and nutrition plan for blood glucose control.    Heart Failure Yes    Intervention Provide a combined exercise and nutrition program that is supplemented with education, support and counseling about heart failure. Directed toward relieving symptoms such as shortness of breath, decreased exercise tolerance, and extremity edema.    Expected Outcomes Improve functional capacity of life;Short term: Attendance in program 2-3 days a week with increased exercise capacity. Reported lower sodium intake. Reported increased fruit and vegetable intake. Reports medication compliance.;Short term: Daily  weights obtained and reported for increase. Utilizing diuretic protocols set by physician.;Long term: Adoption of self-care skills and reduction of barriers for early signs and symptoms recognition and intervention leading to self-care maintenance.    Hypertension Yes    Intervention Provide education on lifestyle modifcations including regular physical activity/exercise, weight management, moderate sodium restriction and increased consumption of fresh fruit, vegetables, and low fat dairy, alcohol moderation, and smoking cessation.;Monitor prescription use compliance.    Expected Outcomes Long Term: Maintenance of blood pressure at goal levels.;Short Term: Continued assessment and intervention until BP is < 140/66mm HG in hypertensive participants. < 130/91mm HG in hypertensive participants with diabetes, heart failure or chronic kidney disease.    Lipids Yes    Intervention Provide education and support for participant on nutrition & aerobic/resistive exercise along with prescribed medications to achieve LDL 70mg , HDL >40mg .    Expected Outcomes Short Term: Participant states understanding of desired cholesterol values and is compliant with medications prescribed. Participant is following exercise prescription and nutrition guidelines.;Long Term: Cholesterol controlled with medications as prescribed, with individualized exercise RX and with personalized nutrition plan. Value goals: LDL < 70mg , HDL > 40 mg.          Education:Diabetes - Individual verbal and written instruction to review signs/symptoms of diabetes, desired ranges of glucose level fasting, after meals and with exercise. Acknowledge that pre and post exercise glucose checks will be done for 3 sessions at entry of program. Flowsheet Row Cardiac Rehab from 04/28/2024 in [facility] Cardiac [facility] Pulmonary Rehab  Date 04/28/24  Educator [facility]  Instruction Review Code 1- Verbalizes Understanding    Core Components/Risk Factors/Patient Goals Review:  Core Components/Risk Factors/Patient Goals at Discharge (Final Review):    ITP Comments:  ITP Comments     Row Name 04/27/24 1346 04/28/24 0921 05/05/24 1126 05/12/24 1119     ITP Comments Virtual Visit completed. Patient informed on EP and RD appointment and 6 Minute walk test. Patient also informed of patient health questionnaires on My Chart. Patient Verbalizes understanding. Visit diagnosis can be found in [facility] 04/10/2024. Completed and gym orientation for cardiac rehab. Initial ITP created and sent for review to Dr. Oneil Pinal, Medical Director. First full day of exercise!  Patient was oriented to gym and equipment including functions, settings, policies, and procedures.  Patient's individual exercise prescription and treatment plan were reviewed.  All starting workloads were established based on the results of the 6 minute walk test done at initial orientation visit.  The plan for exercise progression was also introduced and progression will be customized based on patient's performance and goals. Betty Luna called staff with the decision to be discharged at this time due to her chronic pain. She attended her orientation and first exercise session.       Comments: early discharge ITP

## 2024-05-12 NOTE — Progress Notes (Signed)
 Early Discharge Summary   Betty Luna is discharging early due to chronic pain. She completed 2 of 36 sessions.    6 Minute Walk     Row Name 04/28/24 0922         6 Minute Walk   Phase Initial     Distance 600 feet     Walk Time 5.17 minutes     # of Rest Breaks 2     MPH 1.3     METS 2     RPE 13     Perceived Dyspnea  1     VO2 Peak 2.9     Symptoms Yes (comment)     Comments Bilateral hip pain 7/10     Resting HR 78 bpm     Resting BP 104/52     Resting Oxygen Saturation  97 %     Exercise Oxygen Saturation  during 6 min walk 97 %     Max Ex. HR 100 bpm     Max Ex. BP 122/54     2 Minute Post BP 116/58

## 2024-05-13 LAB — CUP PACEART REMOTE DEVICE CHECK
Battery Remaining Longevity: 86 mo
Battery Remaining Percentage: 89 %
Battery Voltage: 3.11 V
Brady Statistic AP VP Percent: 1 %
Brady Statistic AP VS Percent: 1 %
Brady Statistic AS VP Percent: 99 %
Brady Statistic AS VS Percent: 1 %
Brady Statistic RA Percent Paced: 1 %
Date Time Interrogation Session: 20250722020016
HighPow Impedance: 77 Ohm
HighPow Impedance: 77 Ohm
Implantable Lead Connection Status: 753985
Implantable Lead Connection Status: 753985
Implantable Lead Connection Status: 753985
Implantable Lead Implant Date: 20241015
Implantable Lead Implant Date: 20241015
Implantable Lead Implant Date: 20241015
Implantable Lead Location: 753859
Implantable Lead Location: 753860
Implantable Lead Location: 753860
Implantable Pulse Generator Implant Date: 20241015
Lead Channel Impedance Value: 450 Ohm
Lead Channel Impedance Value: 450 Ohm
Lead Channel Impedance Value: 460 Ohm
Lead Channel Pacing Threshold Amplitude: 0.5 V
Lead Channel Pacing Threshold Amplitude: 0.625 V
Lead Channel Pacing Threshold Amplitude: 1 V
Lead Channel Pacing Threshold Pulse Width: 0.5 ms
Lead Channel Pacing Threshold Pulse Width: 0.5 ms
Lead Channel Pacing Threshold Pulse Width: 0.5 ms
Lead Channel Sensing Intrinsic Amplitude: 1.1 mV
Lead Channel Sensing Intrinsic Amplitude: 12 mV
Lead Channel Setting Pacing Amplitude: 0.25 V
Lead Channel Setting Pacing Amplitude: 1.625
Lead Channel Setting Pacing Amplitude: 2 V
Lead Channel Setting Pacing Pulse Width: 0.05 ms
Lead Channel Setting Pacing Pulse Width: 0.5 ms
Lead Channel Setting Sensing Sensitivity: 0.5 mV
Pulse Gen Serial Number: 5575132
Zone Setting Status: 755011

## 2024-05-14 ENCOUNTER — Encounter

## 2024-05-14 ENCOUNTER — Ambulatory Visit: Payer: Self-pay | Admitting: Cardiology

## 2024-05-19 ENCOUNTER — Encounter

## 2024-05-21 ENCOUNTER — Encounter

## 2024-05-26 ENCOUNTER — Encounter

## 2024-05-28 ENCOUNTER — Encounter

## 2024-05-31 ENCOUNTER — Other Ambulatory Visit: Payer: Self-pay | Admitting: Cardiology

## 2024-05-31 ENCOUNTER — Other Ambulatory Visit: Payer: Self-pay | Admitting: Oncology

## 2024-06-02 ENCOUNTER — Encounter

## 2024-06-02 ENCOUNTER — Encounter: Payer: Self-pay | Admitting: Oncology

## 2024-06-04 ENCOUNTER — Encounter

## 2024-06-09 ENCOUNTER — Other Ambulatory Visit: Payer: Self-pay | Admitting: Family Medicine

## 2024-06-09 ENCOUNTER — Encounter

## 2024-06-09 DIAGNOSIS — Z780 Asymptomatic menopausal state: Secondary | ICD-10-CM

## 2024-06-11 ENCOUNTER — Encounter

## 2024-06-16 ENCOUNTER — Encounter

## 2024-06-18 ENCOUNTER — Encounter

## 2024-06-23 ENCOUNTER — Encounter

## 2024-06-25 ENCOUNTER — Encounter

## 2024-06-29 ENCOUNTER — Telehealth: Payer: Self-pay | Admitting: Family

## 2024-06-29 NOTE — Telephone Encounter (Signed)
 Called to confirm/remind patient of their appointment at the [facility] on 06/30/24.   Appointment:   [] Confirmed  [x] Left mess   [] No answer/No voice mail  [] VM Full/unable to leave message  [] Phone not in service  Patient reminded to bring all medications and/or complete list.  Confirmed patient has transportation. Gave directions, instructed to utilize valet parking.

## 2024-06-30 ENCOUNTER — Encounter

## 2024-06-30 ENCOUNTER — Encounter: Admitting: Cardiology

## 2024-06-30 ENCOUNTER — Other Ambulatory Visit

## 2024-07-02 ENCOUNTER — Encounter

## 2024-07-07 ENCOUNTER — Encounter

## 2024-07-09 ENCOUNTER — Encounter

## 2024-07-14 ENCOUNTER — Encounter

## 2024-07-16 ENCOUNTER — Encounter

## 2024-07-21 ENCOUNTER — Encounter

## 2024-07-22 ENCOUNTER — Other Ambulatory Visit

## 2024-07-23 ENCOUNTER — Encounter

## 2024-07-24 NOTE — Progress Notes (Signed)
 Remote ICD Transmission

## 2024-07-28 ENCOUNTER — Encounter

## 2024-07-28 LAB — LAB REPORT - SCANNED
A1c: 9
EGFR: 75

## 2024-07-30 ENCOUNTER — Encounter

## 2024-08-04 ENCOUNTER — Encounter

## 2024-08-06 ENCOUNTER — Encounter

## 2024-08-11 ENCOUNTER — Ambulatory Visit: Payer: 59

## 2024-08-11 ENCOUNTER — Encounter

## 2024-08-11 DIAGNOSIS — I428 Other cardiomyopathies: Secondary | ICD-10-CM | POA: Diagnosis not present

## 2024-08-12 LAB — CUP PACEART REMOTE DEVICE CHECK
Battery Remaining Longevity: 83 mo
Battery Remaining Percentage: 86 %
Battery Voltage: 3.07 V
Brady Statistic AP VP Percent: 1 %
Brady Statistic AP VS Percent: 1 %
Brady Statistic AS VP Percent: 99 %
Brady Statistic AS VS Percent: 1 %
Brady Statistic RA Percent Paced: 1 %
Date Time Interrogation Session: 20251021031212
HighPow Impedance: 78 Ohm
HighPow Impedance: 78 Ohm
Implantable Lead Connection Status: 753985
Implantable Lead Connection Status: 753985
Implantable Lead Connection Status: 753985
Implantable Lead Implant Date: 20241015
Implantable Lead Implant Date: 20241015
Implantable Lead Implant Date: 20241015
Implantable Lead Location: 753859
Implantable Lead Location: 753860
Implantable Lead Location: 753860
Implantable Pulse Generator Implant Date: 20241015
Lead Channel Impedance Value: 450 Ohm
Lead Channel Impedance Value: 460 Ohm
Lead Channel Impedance Value: 490 Ohm
Lead Channel Pacing Threshold Amplitude: 0.625 V
Lead Channel Pacing Threshold Amplitude: 0.625 V
Lead Channel Pacing Threshold Amplitude: 1 V
Lead Channel Pacing Threshold Pulse Width: 0.5 ms
Lead Channel Pacing Threshold Pulse Width: 0.5 ms
Lead Channel Pacing Threshold Pulse Width: 0.5 ms
Lead Channel Sensing Intrinsic Amplitude: 0.9 mV
Lead Channel Sensing Intrinsic Amplitude: 12 mV
Lead Channel Setting Pacing Amplitude: 0.25 V
Lead Channel Setting Pacing Amplitude: 1.625
Lead Channel Setting Pacing Amplitude: 2 V
Lead Channel Setting Pacing Pulse Width: 0.05 ms
Lead Channel Setting Pacing Pulse Width: 0.5 ms
Lead Channel Setting Sensing Sensitivity: 0.5 mV
Pulse Gen Serial Number: 5575132
Zone Setting Status: 755011

## 2024-08-13 ENCOUNTER — Encounter

## 2024-08-14 ENCOUNTER — Telehealth: Payer: Self-pay | Admitting: Family

## 2024-08-14 NOTE — Progress Notes (Signed)
 Remote ICD Transmission

## 2024-08-14 NOTE — Telephone Encounter (Signed)
 Called to confirm/remind patient of their appointment at the [facility] on 08/17/24.   Appointment:   [x] Confirmed  [] Left mess   [] No answer/No voice mail  [] VM Full/unable to leave message  [] Phone not in service  Patient reminded to bring all medications and/or complete list.  Confirmed patient has transportation. Gave directions, instructed to utilize valet parking.

## 2024-08-16 NOTE — Progress Notes (Unsigned)
 ADVANCED HEART FAILURE CLINIC NOTE  Referring Physician: Henry Fitch, MD  Primary Care: Henry Fitch, MD Primary Cardiologist: Darliss, MD  HPI: Betty Luna is a 73 y.o. female with nonischemic cardiomyopathy, hypertension, hyperlipidemia presenting today for follow up.  Based on chart review it appears that Ms. Milady's cardiac history dates back to February 2023 when she presented with symptoms of chest pain; during that time she lost her daughter, stepdaughter and husband was diagnosed with Alzheimer's.  She had an echocardiogram in March 2023 that demonstrated EF of 35 to 40% and follow-up left heart cath with mild nonobstructive CAD.  She was started on GDMT and since that time repeat imaging has demonstrated persistently worsening LV function with cardiac MRI in April 2024 with LVEF of 25% and otherwise preserved RV function. She struggled with infections in late 2024; Discharged on 09/29/23 with left elbow effusion; underwent arthrocentesis w/ elevated WBC ct and neutrophilic predominance + urate crystals; concerning for infectious etiology. Unfortunately she left AMA after receiving a very short course of IV antibiotics; discharged on doxycycline  & prednisone  taper. Admitted on 08/14/23 with UTI s/p rocephin  & keflex  ([place]). Underwent BIV implant on 08/06/23.   Interval hx:  - She has been doing very well from a cardiac standpoint.  - Euvolemic on exam.  - Compliant with all medications. NYHA IIB; limited by back pain.    Current Outpatient Medications  Medication Sig Dispense Refill   acetaminophen  (TYLENOL ) 500 MG tablet Take 500 mg by mouth every 6 (six) hours as needed for moderate pain.     aspirin  EC 81 MG tablet Take 1 tablet (81 mg total) by mouth daily. 30 tablet 11   carvedilol  (COREG ) 25 MG tablet TAKE 1 TABLET(25 MG) BY MOUTH TWICE DAILY 60 tablet 11   colchicine  0.6 MG tablet Take 1 tablet (0.6 mg total) by mouth daily.     dapagliflozin   propanediol (FARXIGA ) 10 MG TABS tablet Take 10 mg by mouth daily.     diazepam  (VALIUM ) 10 MG tablet      diclofenac Sodium (VOLTAREN) 1 % GEL Apply topically 2 (two) times daily.     ergocalciferol  (VITAMIN D2) 1.25 MG (50000 UT) capsule Take 50,000 Units by mouth once a week.     escitalopram  (LEXAPRO ) 10 MG tablet Take 10 mg by mouth daily. For mood     Ferrous Fumarate (HEMOCYTE - 106 MG FE) 324 (106 Fe) MG TABS tablet Take 1 tablet by mouth daily.     GLUCOTROL XL 10 MG 24 hr tablet Take 10 mg by mouth daily.     losartan  (COZAAR ) 25 MG tablet Take 25 mg by mouth daily.     methocarbamol  (ROBAXIN ) 500 MG tablet 2 (two) times daily as needed     pantoprazole  (PROTONIX ) 40 MG tablet Take 40 mg by mouth daily.  5   pregabalin  (LYRICA ) 100 MG capsule Take 100 mg by mouth 3 (three) times daily as needed (for pain). (Patient not taking: Reported on 04/27/2024)     rosuvastatin  (CRESTOR ) 20 MG tablet Take 1 tablet (20 mg total) by mouth daily. 90 tablet 3   torsemide  (DEMADEX ) 10 MG tablet TAKE 1 TABLET(10 MG) BY MOUTH EVERY DAY     torsemide  (DEMADEX ) 20 MG tablet Take 1 tablet (20 mg total) by mouth every other day. May take an additional tablet as needed for shortness of breath or swelling     No current facility-administered medications for this visit.     PHYSICAL EXAM:  There were no vitals filed for this visit.  GENERAL: NAD Lungs-CTA CARDIAC:  JVP: 7 cm          Normal rate with regular rhythm.  No murmur.  Pulses 2+.  No edema.  ABDOMEN: Soft, non-tender, non-distended.  EXTREMITIES: Warm and well perfused.  NEUROLOGIC: No obvious FND    DATA REVIEW  ECG: 10/21/23: AsVp, QRSd 10/09/22: NSR w/ LBBB (QRSd )  as per my personal interpretation 9/22: Normal sinus rhythm with incomplete left bundle branch block as per my read  ECHO: 06/01/22: LVEF 30-35%, normal RV function  as per my personal interpretation  CATH: 01/01/22: 1.  Mild nonobstructive coronary artery  disease. 2.  Left ventricular angiography was not performed.  EF was moderately reduced by echo.  Moderately elevated left ventricular end-diastolic pressure. 3.  Right heart catheterization showed moderately elevated right and left-sided filling pressures, moderate pulmonary hypertension and normal cardiac output.  Pulmonary hypertension seems to be mostly venous.  CMR: 4/24, personally reviewed.  1.  Severely reduced LV systolic function, LVEF 26%.  2.  No evidence for LGE or scar.  3. No evidence for infiltrative disease or amyloid (normal ECV, relatively normal native T1).  4.  Normal RV size and function.  5.  No significant valvular abnormalities.  6.  Findings consistent with non-ischemic cardiomyopathy.  ASSESSMENT & PLAN:  Heart failure with reduced ejection fraction Etiology of HF: Nonischemic cardiomyopathy as demonstrated by coronary angiography above.  Cardiac MRI from April 2024 without significant LGE or evidence of infiltrative disease.  EKGs from as far back as 2022 demonstrate an incomplete left bundle branch block; QRS duration is not significantly lengthened at that time. Reports that her father may have had CHF and sister; otherwise no FH.  NYHA class / AHA Stage:III, limited by pain in her back/knees; difficult to assess her functional status.  Volume status & Diuretics: torsemide  20mg  every other day; euvolemic on exam today.  Vasodilators: continue losartan  25mg  daily.  Beta-Blocker: taking coreg  25mg  BID MRA: spironolactone  25mg  daily.  Cardiometabolic: discontinue farxiga  due to frequent UTIs.  Devices therapies & Valvulopathies: Now s/p BiV upgrade. Seen by EP on 11/05/23, normal ICD function; 99% BiV pacing. No systemic infectious symptoms. Repeat TTE ASAP, patient now overdue. Last imaging was CMR in 02/06/23 w/ LVEF of 26%.   Advanced therapies:Not indicated.  - Refer to cardiac rehab.   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 Zelphia Cap.   3. Hyperlipidemia - Followed by Dr. Budd - would like to restart statins; previously held due to myopathy - Repeat lipid panel - If she continues to have myalgias, will start PCSK9i.  - start crestor  20mg  daily  4. HTN - see above - repeat BMP/BNP today.   5. T2DM - A1C 11.5 in 4/23; down to 8.1 on 07/24/23.  - Followed by PCP; discussed healthy nutrition.   6. Obesity -There is no height or weight on file to calculate BMI. -discussed importance of weight loss  7. Left elbow swelling - Arthrocentesis inpatient with elevated WBC ct and urate crystals. No fevers/chills or signs of infection. No swelling at the elbow.  - Resolved.   I spent 30 minutes caring for this patient today including face to face time, ordering and reviewing labs, reviewing records from coded above, seeing the patient, documenting in the record, and arranging follow ups.    Aditya Sabharwal Advanced Heart Failure Mechanical Circulatory Support

## 2024-08-17 ENCOUNTER — Ambulatory Visit: Attending: Family | Admitting: Family

## 2024-08-17 ENCOUNTER — Ambulatory Visit: Payer: Self-pay | Admitting: Cardiology

## 2024-08-17 ENCOUNTER — Encounter: Payer: Self-pay | Admitting: Family

## 2024-08-17 VITALS — BP 148/75 | HR 82 | Ht 62.0 in | Wt 214.0 lb

## 2024-08-17 DIAGNOSIS — I5022 Chronic systolic (congestive) heart failure: Secondary | ICD-10-CM | POA: Diagnosis not present

## 2024-08-17 DIAGNOSIS — D563 Thalassemia minor: Secondary | ICD-10-CM | POA: Diagnosis not present

## 2024-08-17 DIAGNOSIS — E119 Type 2 diabetes mellitus without complications: Secondary | ICD-10-CM | POA: Insufficient documentation

## 2024-08-17 DIAGNOSIS — I110 Hypertensive heart disease with heart failure: Secondary | ICD-10-CM | POA: Diagnosis present

## 2024-08-17 DIAGNOSIS — E1169 Type 2 diabetes mellitus with other specified complication: Secondary | ICD-10-CM

## 2024-08-17 DIAGNOSIS — I2510 Atherosclerotic heart disease of native coronary artery without angina pectoris: Secondary | ICD-10-CM | POA: Diagnosis not present

## 2024-08-17 DIAGNOSIS — E785 Hyperlipidemia, unspecified: Secondary | ICD-10-CM | POA: Insufficient documentation

## 2024-08-17 DIAGNOSIS — R0789 Other chest pain: Secondary | ICD-10-CM | POA: Insufficient documentation

## 2024-08-17 DIAGNOSIS — I509 Heart failure, unspecified: Secondary | ICD-10-CM | POA: Diagnosis present

## 2024-08-17 DIAGNOSIS — E669 Obesity, unspecified: Secondary | ICD-10-CM | POA: Diagnosis not present

## 2024-08-17 DIAGNOSIS — Z794 Long term (current) use of insulin: Secondary | ICD-10-CM

## 2024-08-17 DIAGNOSIS — Z6839 Body mass index (BMI) 39.0-39.9, adult: Secondary | ICD-10-CM | POA: Insufficient documentation

## 2024-08-17 DIAGNOSIS — Z7982 Long term (current) use of aspirin: Secondary | ICD-10-CM | POA: Diagnosis not present

## 2024-08-17 DIAGNOSIS — Z87440 Personal history of urinary (tract) infections: Secondary | ICD-10-CM | POA: Insufficient documentation

## 2024-08-17 DIAGNOSIS — M549 Dorsalgia, unspecified: Secondary | ICD-10-CM | POA: Insufficient documentation

## 2024-08-17 DIAGNOSIS — I10 Essential (primary) hypertension: Secondary | ICD-10-CM

## 2024-08-17 DIAGNOSIS — Z791 Long term (current) use of non-steroidal anti-inflammatories (NSAID): Secondary | ICD-10-CM | POA: Diagnosis not present

## 2024-08-17 DIAGNOSIS — Z79899 Other long term (current) drug therapy: Secondary | ICD-10-CM | POA: Insufficient documentation

## 2024-08-17 DIAGNOSIS — I428 Other cardiomyopathies: Secondary | ICD-10-CM | POA: Insufficient documentation

## 2024-08-17 DIAGNOSIS — D472 Monoclonal gammopathy: Secondary | ICD-10-CM | POA: Diagnosis not present

## 2024-08-17 DIAGNOSIS — Z7984 Long term (current) use of oral hypoglycemic drugs: Secondary | ICD-10-CM | POA: Insufficient documentation

## 2024-08-17 MED ORDER — SPIRONOLACTONE 25 MG PO TABS: 25.0000 mg | ORAL_TABLET | Freq: Every day | ORAL | 3 refills | Status: AC

## 2024-08-17 NOTE — Patient Instructions (Signed)
 Medication Changes:  Start Spironolactone  25 MG once daily  Decrease Torsemide  to 20 MG on Mondays, Wednesdays, and Fridays only.   Lab Work:  Go downstairs to [facility] on [address] to have your blood work completed next week after starting Spironolactone .  We will only call you if the results are abnormal or if the provider would like to make medication changes.  No news is good news.   Follow-Up in: 1 month with Ellouise Class, FNP.   Thank you for choosing [facility].    At the Advanced [facility] Failure Clinic, you and your health needs are our priority. We have a designated team specialized in the treatment of Heart Failure. This Care Team includes your primary Heart Failure Specialized Cardiologist (physician), Advanced Practice Providers (APPs- Physician Assistants and Nurse Practitioners), and Pharmacist who all work together to provide you with the care you need, when you need it.   You may see any of the following providers on your designated Care Team at your next follow up:  Dr. Toribio Fuel Dr. Ezra Shuck Dr. Ria Commander Dr. Morene Brownie Ellouise Class, FNP Jaun Bash, RPH-CPP  Please be sure to bring in all your medications bottles to every appointment.   Need to Contact Us :  If you have any questions or concerns before your next appointment please send us  a message through [facility] or call our office at [phone].    TO LEAVE A MESSAGE FOR THE NURSE SELECT OPTION 2, PLEASE LEAVE A MESSAGE INCLUDING: YOUR NAME DATE OF BIRTH CALL BACK NUMBER REASON FOR CALL**this is important as we prioritize the call backs  YOU WILL RECEIVE A CALL BACK THE SAME DAY AS LONG AS YOU CALL BEFORE 4:00 PM

## 2024-08-18 ENCOUNTER — Encounter

## 2024-08-18 ENCOUNTER — Other Ambulatory Visit

## 2024-08-20 ENCOUNTER — Encounter

## 2024-08-25 ENCOUNTER — Encounter

## 2024-08-27 ENCOUNTER — Encounter

## 2024-09-08 ENCOUNTER — Other Ambulatory Visit: Payer: Self-pay

## 2024-09-08 MED ORDER — LOSARTAN POTASSIUM 25 MG PO TABS
25.0000 mg | ORAL_TABLET | Freq: Every day | ORAL | 3 refills | Status: AC
Start: 2024-09-08 — End: ?

## 2024-09-11 ENCOUNTER — Other Ambulatory Visit
Admission: RE | Admit: 2024-09-11 | Discharge: 2024-09-11 | Disposition: A | Discharge status: Home / Self Care | Attending: Family | Admitting: Family

## 2024-09-11 ENCOUNTER — Ambulatory Visit: Payer: Self-pay | Admitting: Family

## 2024-09-11 DIAGNOSIS — I5022 Chronic systolic (congestive) heart failure: Secondary | ICD-10-CM | POA: Diagnosis present

## 2024-09-11 LAB — BASIC METABOLIC PANEL WITH GFR
Anion gap: 12 (ref 5–15)
BUN: 19 mg/dL (ref 8–23)
CO2: 24 mmol/L (ref 22–32)
Calcium: 9.3 mg/dL (ref 8.9–10.3)
Chloride: 105 mmol/L (ref 98–111)
Creatinine, Ser: 0.97 mg/dL (ref 0.44–1.00)
GFR, Estimated: >60 mL/min (ref >60)
Glucose, Bld: 218 mg/dL — ABNORMAL HIGH (ref 70–99)
Potassium: 4.1 mmol/L (ref 3.5–5.1)
Sodium: 141 mmol/L (ref 135–145)

## 2024-09-14 ENCOUNTER — Encounter: Payer: Self-pay | Admitting: Oncology

## 2024-09-15 NOTE — Progress Notes (Unsigned)
  Electrophysiology Office Follow up Visit Note:    Date:  09/15/2024   ID:  Betty Luna, DOB 26-Feb-1951, MRN [id number]  PCP:  Henry Fitch, MD  [facility] Cardiologist:  Redell Cave, MD  [facility] Electrophysiologist:  OLE ONEIDA HOLTS, MD    Interval History:     Betty Luna is a 73 y.o. female who presents for a follow up visit.   Betty Luna last saw Betty Luna on November 05, 2023.  The patient has a history of chronic systolic heart failure followed by the heart failure clinic, hypertension, hyperlipidemia, CKD 3A, diabetes and MGUS.  At that last appointment with Betty, she was doing well from an EP perspective.  She is doing well today.  She reports no cardiovascular complaints.  She says her back bothers her but that is a chronic issue.       Past medical, surgical, social and family history were reviewed.  ROS:   Please see the history of present illness.    All other systems reviewed and are negative.  EKGs/Labs/Other Studies Reviewed:    The following studies were reviewed today:  September 16, 2024 in-clinic device interrogation personally reviewed Battery and lead parameter stable.  Ventricular pacing greater than 99%.        Physical Exam:    VS:  There were no vitals taken for this visit.    Wt Readings from Last 3 Encounters:  08/17/24 214 lb (97.1 kg)  04/28/24 217 lb 12.8 oz (98.8 kg)  04/10/24 214 lb 12.8 oz (97.4 kg)     GEN: no distress CARD: RRR, No MRG RESP: No IWOB. CTAB.      ASSESSMENT:    No diagnosis found. PLAN:    In order of problems listed above:  #Chronic systolic heart failure #Nonischemic cardiomyopathy #Left bundle branch block #CRT-D in situ Device functioning appropriately.  Continue remote monitoring.  NYHA class II-III.  Warm and dry on exam.  Continue GDMT (Coreg , Farxiga , losartan , spironolactone , torsemide )  #Hypertension At goal today.  Recommend checking blood  pressures 1-2 times per week at home and recording the values.  Recommend bringing these recordings to the primary care physician.  I discussed my upcoming departure from Betty Luna during today's clinic appointment.  The patient will continue to follow-up with one of my EP partners moving forward.  Follow-up 1 year with EP APP   Signed, Ole Holts, MD, [facility], [facility] 09/15/2024 8:06 AM    Electrophysiology [facility]

## 2024-09-16 ENCOUNTER — Ambulatory Visit: Attending: Cardiology | Admitting: Cardiology

## 2024-09-16 ENCOUNTER — Encounter: Payer: Self-pay | Admitting: Cardiology

## 2024-09-16 ENCOUNTER — Telehealth: Payer: Self-pay | Admitting: Family

## 2024-09-16 VITALS — BP 118/60 | HR 84 | Ht 62.0 in | Wt 213.6 lb

## 2024-09-16 DIAGNOSIS — I5022 Chronic systolic (congestive) heart failure: Secondary | ICD-10-CM | POA: Diagnosis not present

## 2024-09-16 DIAGNOSIS — I428 Other cardiomyopathies: Secondary | ICD-10-CM | POA: Diagnosis not present

## 2024-09-16 DIAGNOSIS — Z95810 Presence of automatic (implantable) cardiac defibrillator: Secondary | ICD-10-CM

## 2024-09-16 LAB — CUP PACEART INCLINIC DEVICE CHECK
Battery Remaining Longevity: 82 mo
Brady Statistic RA Percent Paced: 0.05 %
Brady Statistic RV Percent Paced: 99.6 %
Date Time Interrogation Session: 20251126090707
HighPow Impedance: 81 Ohm
Implantable Lead Connection Status: 753985
Implantable Lead Connection Status: 753985
Implantable Lead Connection Status: 753985
Implantable Lead Implant Date: 20241015
Implantable Lead Implant Date: 20241015
Implantable Lead Implant Date: 20241015
Implantable Lead Location: 753859
Implantable Lead Location: 753860
Implantable Lead Location: 753860
Implantable Pulse Generator Implant Date: 20241015
Lead Channel Impedance Value: 487.5 Ohm
Lead Channel Impedance Value: 487.5 Ohm
Lead Channel Impedance Value: 512.5 Ohm
Lead Channel Pacing Threshold Amplitude: 0.75 V
Lead Channel Pacing Threshold Amplitude: 0.75 V
Lead Channel Pacing Threshold Amplitude: 0.75 V
Lead Channel Pacing Threshold Amplitude: 0.75 V
Lead Channel Pacing Threshold Amplitude: 1 V
Lead Channel Pacing Threshold Amplitude: 1 V
Lead Channel Pacing Threshold Pulse Width: 0.5 ms
Lead Channel Pacing Threshold Pulse Width: 0.5 ms
Lead Channel Pacing Threshold Pulse Width: 0.5 ms
Lead Channel Pacing Threshold Pulse Width: 0.5 ms
Lead Channel Pacing Threshold Pulse Width: 0.5 ms
Lead Channel Pacing Threshold Pulse Width: 0.5 ms
Lead Channel Sensing Intrinsic Amplitude: 1.2 mV
Lead Channel Sensing Intrinsic Amplitude: 12 mV
Lead Channel Setting Pacing Amplitude: 0.25 V
Lead Channel Setting Pacing Amplitude: 1.625
Lead Channel Setting Pacing Amplitude: 2 V
Lead Channel Setting Pacing Pulse Width: 0.05 ms
Lead Channel Setting Pacing Pulse Width: 0.5 ms
Lead Channel Setting Sensing Sensitivity: 0.5 mV
Pulse Gen Serial Number: 5575132
Zone Setting Status: 755011

## 2024-09-16 NOTE — Telephone Encounter (Signed)
 Called to confirm/remind patient of their appointment at the [facility] Failure Clinic on 09/21/24.   Appointment:   [] Confirmed  [x] Left mess   [] No answer/No voice mail  [] VM Full/unable to leave message  [] Phone not in service  Patient reminded to bring all medications and/or complete list.  Confirmed patient has transportation. Gave directions, instructed to utilize valet parking.

## 2024-09-16 NOTE — Patient Instructions (Signed)
 Medication Instructions:  Your physician recommends that you continue on your current medications as directed. Please refer to the Current Medication list given to you today.  *If you need a refill on your cardiac medications before your next appointment, please call your pharmacy*  Follow-Up: At [facility], you and your health needs are our priority.  As part of our continuing mission to provide you with exceptional heart care, our providers are all part of one team.  This team includes your primary Cardiologist (physician) and Advanced Practice Providers or APPs (Physician Assistants and Nurse Practitioners) who all work together to provide you with the care you need, when you need it.  Your next appointment:   1 year(s)  Provider:   Suzann Riddle, NP

## 2024-09-20 NOTE — Progress Notes (Deleted)
 "  ADVANCED HEART FAILURE CLINIC NOTE  Referring Physician: Henry Fitch, MD  Primary Care: Henry Fitch, MD Primary Cardiologist: Darliss, MD  Chief Complaint:    HPI:  Betty Luna is a 73 y.o. female with nonischemic cardiomyopathy, hypertension, hyperlipidemia presenting today for follow up.  Based on chart review it appears that Betty Luna's cardiac history dates back to February 2023 when she presented with symptoms of chest pain; during that time she lost her daughter, stepdaughter and husband was diagnosed with Alzheimer's.  She had an echocardiogram in March 2023 that demonstrated EF of 35 to 40% and follow-up left heart cath with mild nonobstructive CAD.  She was started on GDMT and since that time repeat imaging has demonstrated persistently worsening LV function with cardiac MRI in April 2024 with LVEF of 25% and otherwise preserved RV function. She struggled with infections in late 2024; Discharged on 09/29/23 with left elbow effusion; underwent arthrocentesis w/ elevated WBC ct and neutrophilic predominance + urate crystals; concerning for infectious etiology. Unfortunately she left AMA after receiving a very short course of IV antibiotics; discharged on doxycycline  & prednisone  taper. Admitted on 08/14/23 with UTI s/p rocephin  & keflex  ([place]). Underwent BIV implant on 08/06/23.   She presents today, with her granddaughter, for a HF follow-up visit with a chief complaint of fatigue. Has associated intermittent chest pain, chronic difficulty sleeping. Was unable to finish cardiac rehab due to severity of back pain. Denies any SOB, palpitations, dizziness, pedal / abdominal edema. Currently taking alternating doses of torsemide  (20mg  / 10mg ).  ROS: All systems negative except what is listed in HPI, PMH and Problem List   Current Outpatient Medications  Medication Sig Dispense Refill   acetaminophen  (TYLENOL ) 500 MG tablet Take 500 mg by mouth every 6 (six)  hours as needed for moderate pain.     aspirin  EC 81 MG tablet Take 1 tablet (81 mg total) by mouth daily. 30 tablet 11   carvedilol  (COREG ) 25 MG tablet TAKE 1 TABLET(25 MG) BY MOUTH TWICE DAILY 60 tablet 11   colchicine  0.6 MG tablet Take 1 tablet (0.6 mg total) by mouth daily.     dapagliflozin  propanediol (FARXIGA ) 10 MG TABS tablet Take 10 mg by mouth daily.     diclofenac Sodium (VOLTAREN) 1 % GEL Apply topically 2 (two) times daily.     ergocalciferol  (VITAMIN D2) 1.25 MG (50000 UT) capsule Take 50,000 Units by mouth once a week.     escitalopram  (LEXAPRO ) 10 MG tablet Take 10 mg by mouth daily. For mood     ferrous gluconate  (FERGON) 324 MG tablet Take 324 mg by mouth daily with breakfast.     losartan  (COZAAR ) 25 MG tablet Take 1 tablet (25 mg total) by mouth daily. 90 tablet 3   metformin  (FORTAMET ) 500 MG (OSM) 24 hr tablet Take 1,000 mg by mouth 2 (two) times daily with a meal.     methocarbamol  (ROBAXIN ) 500 MG tablet 2 (two) times daily as needed     pantoprazole  (PROTONIX ) 40 MG tablet Take 40 mg by mouth daily.  5   pregabalin  (LYRICA ) 100 MG capsule Take 100 mg by mouth 3 (three) times daily as needed (for pain).     rOPINIRole (REQUIP XL) 2 MG 24 hr tablet Take 2 mg by mouth at bedtime.     rosuvastatin  (CRESTOR ) 20 MG tablet Take 1 tablet (20 mg total) by mouth daily. 90 tablet 3   spironolactone  (ALDACTONE ) 25 MG tablet Take 1 tablet (25  mg total) by mouth daily. 90 tablet 3   torsemide  (DEMADEX ) 10 MG tablet TAKE 1 TABLET(10 MG) BY MOUTH EVERY DAY     torsemide  (DEMADEX ) 20 MG tablet Take 1 tablet (20 mg total) by mouth every other day. May take an additional tablet as needed for shortness of breath or swelling     No current facility-administered medications for this visit.   There were no vitals filed for this visit.  Wt Readings from Last 3 Encounters:  09/16/24 213 lb 9.6 oz (96.9 kg)  08/17/24 214 lb (97.1 kg)  04/28/24 217 lb 12.8 oz (98.8 kg)   Lab Results   Component Value Date   CREATININE 0.97 09/11/2024   CREATININE 1.21 (H) 04/10/2024   CREATININE 0.90 01/14/2024    PHYSICAL EXAM:  General: Well appearing.  Cor: No JVD. Regular rhythm, rate.  Lungs: clear Abdomen: soft, nontender, nondistended. Extremities: no edema Neuro:. Affect pleasant   DATA REVIEW  ECG: 01/14/24: AsVp, QRSd 10/21/23: AsVp, QRSd 10/09/22: NSR w/ LBBB (QRSd )  9/22: Normal sinus rhythm with incomplete left bundle branch block   ECHO: 06/01/22: LVEF 30-35%, normal RV function  CATH: 01/01/22: 1.  Mild nonobstructive coronary artery disease. 2.  Left ventricular angiography was not performed.  EF was moderately reduced by echo.  Moderately elevated left ventricular end-diastolic pressure. 3.  Right heart catheterization showed moderately elevated right and left-sided filling pressures, moderate pulmonary hypertension and normal cardiac output.  Pulmonary hypertension seems to be mostly venous.  CMR: 4/24, personally reviewed.  1.  Severely reduced LV systolic function, LVEF 26%.  2.  No evidence for LGE or scar.  3. No evidence for infiltrative disease or amyloid (normal ECV, relatively normal native T1).  4.  Normal RV size and function.  5.  No significant valvular abnormalities.  6.  Findings consistent with non-ischemic cardiomyopathy.  ASSESSMENT & PLAN:  Heart failure with reduced ejection fraction Etiology of HF: Nonischemic cardiomyopathy as demonstrated by coronary angiography above.  Cardiac MRI from April 2024 without significant LGE or evidence of infiltrative disease.  EKGs from as far back as 2022 demonstrate an incomplete left bundle branch block; QRS duration is not significantly lengthened at that time. Reports that her father may have had CHF and sister; otherwise no FH.  NYHA class / AHA Stage:III, limited by pain in her back/knees; difficult to assess her functional status.  Volume status & Diuretics: decrease  torsemide  to 20mg  M, W, F with starting MRA. Euvolemic today Vasodilators: continue losartan  25mg  daily.  Beta-Blocker: continue coreg  25mg  BID MRA: Begin spironolactone  25mg  daily. Decreasing torsemide  per above. BMET next week and at next visit.  Cardiometabolic: continue farxiga  10mg  daily (no further UTI's) Devices therapies & Valvulopathies: Now s/p BiV upgrade. Seen by EP on 11/05/23, normal ICD function; 99% BiV pacing. No systemic infectious symptoms. Repeat TTE ASAP, patient now overdue.Order placed today. Last imaging was CMR in 02/06/23 w/ LVEF of 26%.   Advanced therapies:Not indicated.  - Did some sessions at cardiac rehab but unable to continue due to her back pain - stable from last visit here 4 months ago  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 Zelphia Cap.   3. Hyperlipidemia - Followed by Dr. Budd - LDL 04/10/24 was 156 - continue crestor  20mg  daily. Denies any current myalgias. - If she develops myalgias, will start PCSK9i.   4. HTN - BP 148/75. Beginning spiro per above - BMP 04/10/24 reviewed: sodium 134, potassium  3.6, creatinine 1.21, GFR 48. She reports recent labs at PCP office so will get a copy of those results - BMET next week and at next visit   5. T2DM - A1C 11.5 in 4/23; down to 8.1 on 07/24/23.  - Followed by PCP; discussed healthy nutrition.   6. Obesity - BMI 39.14 kg/ m2 - discussed importance of weight loss   Return in 1 month, sooner if needed.   I spent 37 minutes reviewing records, interviewing/ examing patient and managing plan/ orders.    Ellouise DELENA Class FNP=C 09/20/24 "

## 2024-09-21 ENCOUNTER — Ambulatory Visit: Admitting: Family

## 2024-09-21 ENCOUNTER — Telehealth: Payer: Self-pay | Admitting: Family

## 2024-09-21 NOTE — Telephone Encounter (Signed)
 Patient did not show for her Heart Failure Clinic appointment on 09/21/24.

## 2024-09-27 ENCOUNTER — Ambulatory Visit: Payer: Self-pay | Admitting: Cardiology

## 2024-10-06 ENCOUNTER — Ambulatory Visit: Attending: Family

## 2024-11-09 ENCOUNTER — Encounter: Payer: Self-pay | Admitting: Oncology

## 2024-11-10 ENCOUNTER — Ambulatory Visit: Payer: 59

## 2024-11-10 DIAGNOSIS — I428 Other cardiomyopathies: Secondary | ICD-10-CM | POA: Diagnosis not present

## 2024-11-11 ENCOUNTER — Telehealth: Payer: Self-pay | Admitting: Family

## 2024-11-11 LAB — CUP PACEART REMOTE DEVICE CHECK
Battery Remaining Longevity: 79 mo
Battery Remaining Percentage: 84 %
Battery Voltage: 3.04 V
Brady Statistic AP VP Percent: 1 %
Brady Statistic AP VS Percent: 1 %
Brady Statistic AS VP Percent: 99 %
Brady Statistic AS VS Percent: 1 %
Brady Statistic RA Percent Paced: 1 %
Date Time Interrogation Session: 20260120020016
HighPow Impedance: 77 Ohm
HighPow Impedance: 77 Ohm
Implantable Lead Connection Status: 753985
Implantable Lead Connection Status: 753985
Implantable Lead Connection Status: 753985
Implantable Lead Implant Date: 20241015
Implantable Lead Implant Date: 20241015
Implantable Lead Implant Date: 20241015
Implantable Lead Location: 753859
Implantable Lead Location: 753860
Implantable Lead Location: 753860
Implantable Pulse Generator Implant Date: 20241015
Lead Channel Impedance Value: 450 Ohm
Lead Channel Impedance Value: 450 Ohm
Lead Channel Impedance Value: 510 Ohm
Lead Channel Pacing Threshold Amplitude: 0.625 V
Lead Channel Pacing Threshold Amplitude: 0.625 V
Lead Channel Pacing Threshold Amplitude: 1 V
Lead Channel Pacing Threshold Pulse Width: 0.5 ms
Lead Channel Pacing Threshold Pulse Width: 0.5 ms
Lead Channel Pacing Threshold Pulse Width: 0.5 ms
Lead Channel Sensing Intrinsic Amplitude: 0.8 mV
Lead Channel Sensing Intrinsic Amplitude: 12 mV
Lead Channel Setting Pacing Amplitude: 0.25 V
Lead Channel Setting Pacing Amplitude: 1.625
Lead Channel Setting Pacing Amplitude: 2 V
Lead Channel Setting Pacing Pulse Width: 0.05 ms
Lead Channel Setting Pacing Pulse Width: 0.5 ms
Lead Channel Setting Sensing Sensitivity: 0.5 mV
Pulse Gen Serial Number: 5575132
Zone Setting Status: 755011

## 2024-11-11 NOTE — Telephone Encounter (Signed)
 Called to confirm/remind patient of their appointment at the [facility] on 11/12/24.   Appointment:   [] Confirmed  [x] Left mess   [] No answer/No voice mail  [] VM Full/unable to leave message  [] Phone not in service  Patient reminded to bring all medications and/or complete list.  Confirmed patient has transportation. Gave directions, instructed to utilize valet parking.

## 2024-11-11 NOTE — Progress Notes (Unsigned)
 "  ADVANCED HEART FAILURE CLINIC NOTE  Referring Physician: Henry Fitch, MD  Primary Care: Henry Fitch, MD Primary Cardiologist: Darliss, MD  Chief Complaint: shortness of breath   HPI:  Betty Luna is a 74 y.o. female with nonischemic cardiomyopathy, hypertension, hyperlipidemia presenting today for follow up.  Based on chart review it appears that Betty Luna's cardiac history dates back to February 2023 when she presented with symptoms of chest pain; during that time she lost her daughter, stepdaughter and husband was diagnosed with Alzheimer's.  She had an echocardiogram in March 2023 that demonstrated EF of 35 to 40% and follow-up left heart cath with mild nonobstructive CAD.  She was started on GDMT and since that time repeat imaging has demonstrated persistently worsening LV function with cardiac MRI in April 2024 with LVEF of 25% and otherwise preserved RV function. She struggled with infections in late 2024; Discharged on 09/29/23 with left elbow effusion; underwent arthrocentesis w/ elevated WBC ct and neutrophilic predominance + urate crystals; concerning for infectious etiology. Unfortunately she left AMA after receiving a very short course of IV antibiotics; discharged on doxycycline  & prednisone  taper. Admitted on 08/14/23 with UTI s/p rocephin  & keflex  (Betty Luna). Underwent BIV implant on 08/06/23.   Seen in [facility] 10/25 where spironolactone  25mg  daily ws started along with reduction of torsemide  to 20mg  M, W, F  She presents today, with her daughter, for a HF follow-up visit with a chief complaint of minimal shortness of breath due to back pain. Has chronic trouble sleeping. Denies any fatigue, chest pain, palpitations, abdominal distention, pedal edema, dizziness. Has not taken any of her medications yet this morning but will do so upon her return home. She feels like her shortness of breath is more related to her chronic back pain.   ROS: All systems negative  except what is listed in HPI, PMH and Problem List   Current Outpatient Medications  Medication Sig Dispense Refill   acetaminophen  (TYLENOL ) 500 MG tablet Take 500 mg by mouth every 6 (six) hours as needed for moderate pain.     aspirin  EC 81 MG tablet Take 1 tablet (81 mg total) by mouth daily. 30 tablet 11   carvedilol  (COREG ) 25 MG tablet TAKE 1 TABLET(25 MG) BY MOUTH TWICE DAILY 60 tablet 11   colchicine  0.6 MG tablet Take 1 tablet (0.6 mg total) by mouth daily.     dapagliflozin  propanediol (FARXIGA ) 10 MG TABS tablet Take 10 mg by mouth daily.     diclofenac Sodium (VOLTAREN) 1 % GEL Apply topically 2 (two) times daily.     ergocalciferol  (VITAMIN D2) 1.25 MG (50000 UT) capsule Take 50,000 Units by mouth once a week.     escitalopram  (LEXAPRO ) 10 MG tablet Take 10 mg by mouth daily. For mood     ferrous gluconate  (FERGON) 324 MG tablet Take 324 mg by mouth daily with breakfast.     losartan  (COZAAR ) 25 MG tablet Take 1 tablet (25 mg total) by mouth daily. 90 tablet 3   metformin  (FORTAMET ) 500 MG (OSM) 24 hr tablet Take 1,000 mg by mouth 2 (two) times daily with a meal.     methocarbamol  (ROBAXIN ) 500 MG tablet 2 (two) times daily as needed     pantoprazole  (PROTONIX ) 40 MG tablet Take 40 mg by mouth daily.  5   pregabalin  (LYRICA ) 100 MG capsule Take 100 mg by mouth 3 (three) times daily as needed (for pain).     rOPINIRole (REQUIP XL) 2 MG 24  hr tablet Take 2 mg by mouth at bedtime.     rosuvastatin  (CRESTOR ) 20 MG tablet Take 1 tablet (20 mg total) by mouth daily. 90 tablet 3   spironolactone  (ALDACTONE ) 25 MG tablet Take 1 tablet (25 mg total) by mouth daily. 90 tablet 3   torsemide  (DEMADEX ) 10 MG tablet TAKE 1 TABLET(10 MG) BY MOUTH EVERY DAY     torsemide  (DEMADEX ) 20 MG tablet Take 1 tablet (20 mg total) by mouth every other day. May take an additional tablet as needed for shortness of breath or swelling     No current facility-administered medications for this visit.    Vitals:   11/12/24 1114  BP: (!) 144/85  Pulse: 77  SpO2: 98%  Weight: 214 lb (97.1 kg)   Wt Readings from Last 3 Encounters:  11/12/24 214 lb (97.1 kg)  09/16/24 213 lb 9.6 oz (96.9 kg)  08/17/24 214 lb (97.1 kg)   Lab Results  Component Value Date   CREATININE 0.97 09/11/2024   CREATININE 1.21 (H) 04/10/2024   CREATININE 0.90 01/14/2024    PHYSICAL EXAM:  General: Well appearing.  Cor: No JVD. Regular rhythm, rate.  Lungs: clear Abdomen: soft, nontender, nondistended. Extremities: no edema Neuro:. Affect pleasant  SJ ICD interrogation today: BP 99%, 0 shocks and impedance stable  DATA REVIEW  ECG: 01/14/24: AsVp, QRSd 10/21/23: AsVp, QRSd 10/09/22: NSR w/ LBBB (QRSd )  9/22: Normal sinus rhythm with incomplete left bundle branch block   ECHO: 06/01/22: LVEF 30-35%, normal RV function  CATH: 01/01/22: 1.  Mild nonobstructive coronary artery disease. 2.  Left ventricular angiography was not performed.  EF was moderately reduced by echo.  Moderately elevated left ventricular end-diastolic pressure. 3.  Right heart catheterization showed moderately elevated right and left-sided filling pressures, moderate pulmonary hypertension and normal cardiac output.  Pulmonary hypertension seems to be mostly venous.  CMR: 4/24, personally reviewed by Dr Gardenia 1.  Severely reduced LV systolic function, LVEF 26%.  2.  No evidence for LGE or scar.  3. No evidence for infiltrative disease or amyloid (normal ECV, relatively normal native T1).  4.  Normal RV size and function.  5.  No significant valvular abnormalities.  6.  Findings consistent with non-ischemic cardiomyopathy.  ASSESSMENT & PLAN:  Heart failure with reduced ejection fraction Etiology of HF: Nonischemic cardiomyopathy as demonstrated by coronary angiography above.  Cardiac MRI from April 2024 without significant LGE or evidence of infiltrative disease.  EKGs from as far back as 2022  demonstrate an incomplete left bundle branch block; QRS duration is not significantly lengthened at that time. Reports that her father may have had CHF and sister; otherwise no FH.  NYHA class / AHA Stage:  II limited by her back pain, euvolemic Volume status & Diuretics: Continue torsemide  20mg  M, W, F. Euvolemic today Vasodilators: continue losartan  25mg  daily.  Beta-Blocker: continue coreg  25mg  BID MRA: Continue spironolactone  25mg  daily. BMET today Cardiometabolic: continue farxiga  10mg  daily  Devices therapies & Valvulopathies: Now s/p BiV upgrade. Seen by EP 11/25, normal ICD function; 99% BiV pacing. No systemic infectious symptoms. NS for echo on 10/06/24. Currently r/s for 11/25/24. Last imaging was CMR in 02/06/23 w/ LVEF of 26%.  - ICD interrogated today, stable impedance   Advanced therapies:Not indicated.  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 Zelphia Cap.   3. Hyperlipidemia - Followed by Dr. Budd - LDL 04/10/24 was 156 - continue crestor  20mg  daily. Denies any current myalgias. - If she develops myalgias, will start PCSK9i.   4. HTN - BP 144/85. Has not taken any of her medications yet today. Encouraged to take them before coming to appointments - BMP 09/11/24 reviewed: sodium 141, potassium 4.1, creatinine 0.97, GFR >60.  - BMET today  5. T2DM - A1C 07/28/24 was 9.0% - Followed by PCP; discussed healthy nutrition.   6. Obesity - BMI 39.14 kg/ m2 - discussed importance of weight loss   Return in 3 months, sooner if needed. Previously seen Dr. Gardenia so will get her established with another MD.   I spent 40 minutes reviewing records, interviewing/ examing patient and managing plan/ orders.   Ellouise DELENA Class FNP-C 11/11/24 "

## 2024-11-12 ENCOUNTER — Ambulatory Visit: Attending: Family | Admitting: Family

## 2024-11-12 ENCOUNTER — Encounter: Payer: Self-pay | Admitting: Family

## 2024-11-12 VITALS — BP 144/85 | HR 77 | Wt 214.0 lb

## 2024-11-12 DIAGNOSIS — Z794 Long term (current) use of insulin: Secondary | ICD-10-CM

## 2024-11-12 DIAGNOSIS — E669 Obesity, unspecified: Secondary | ICD-10-CM | POA: Diagnosis not present

## 2024-11-12 DIAGNOSIS — I428 Other cardiomyopathies: Secondary | ICD-10-CM | POA: Insufficient documentation

## 2024-11-12 DIAGNOSIS — I5022 Chronic systolic (congestive) heart failure: Secondary | ICD-10-CM | POA: Insufficient documentation

## 2024-11-12 DIAGNOSIS — I447 Left bundle-branch block, unspecified: Secondary | ICD-10-CM | POA: Diagnosis not present

## 2024-11-12 DIAGNOSIS — E1169 Type 2 diabetes mellitus with other specified complication: Secondary | ICD-10-CM

## 2024-11-12 DIAGNOSIS — Z95810 Presence of automatic (implantable) cardiac defibrillator: Secondary | ICD-10-CM | POA: Insufficient documentation

## 2024-11-12 DIAGNOSIS — E119 Type 2 diabetes mellitus without complications: Secondary | ICD-10-CM | POA: Diagnosis not present

## 2024-11-12 DIAGNOSIS — E785 Hyperlipidemia, unspecified: Secondary | ICD-10-CM | POA: Diagnosis not present

## 2024-11-12 DIAGNOSIS — Z6839 Body mass index (BMI) 39.0-39.9, adult: Secondary | ICD-10-CM | POA: Insufficient documentation

## 2024-11-12 DIAGNOSIS — I10 Essential (primary) hypertension: Secondary | ICD-10-CM

## 2024-11-12 DIAGNOSIS — I2510 Atherosclerotic heart disease of native coronary artery without angina pectoris: Secondary | ICD-10-CM | POA: Diagnosis not present

## 2024-11-12 DIAGNOSIS — I110 Hypertensive heart disease with heart failure: Secondary | ICD-10-CM | POA: Diagnosis not present

## 2024-11-12 DIAGNOSIS — Z7982 Long term (current) use of aspirin: Secondary | ICD-10-CM | POA: Insufficient documentation

## 2024-11-12 DIAGNOSIS — D472 Monoclonal gammopathy: Secondary | ICD-10-CM | POA: Diagnosis not present

## 2024-11-12 DIAGNOSIS — R0602 Shortness of breath: Secondary | ICD-10-CM | POA: Diagnosis present

## 2024-11-12 DIAGNOSIS — Z7984 Long term (current) use of oral hypoglycemic drugs: Secondary | ICD-10-CM | POA: Insufficient documentation

## 2024-11-12 DIAGNOSIS — Z79899 Other long term (current) drug therapy: Secondary | ICD-10-CM | POA: Diagnosis not present

## 2024-11-12 DIAGNOSIS — D563 Thalassemia minor: Secondary | ICD-10-CM | POA: Diagnosis not present

## 2024-11-12 LAB — BASIC METABOLIC PANEL WITH GFR
BUN/Creatinine Ratio: 21 (ref 12–28)
BUN: 21 mg/dL (ref 8–27)
CO2: 20 mmol/L (ref 20–29)
Calcium: 9.9 mg/dL (ref 8.7–10.3)
Chloride: 104 mmol/L (ref 96–106)
Creatinine, Ser: 0.98 mg/dL (ref 0.57–1.00)
Glucose: 151 mg/dL — ABNORMAL HIGH (ref 70–99)
Potassium: 4.6 mmol/L (ref 3.5–5.2)
Sodium: 140 mmol/L (ref 134–144)
eGFR: 61 mL/min/1.73

## 2024-11-12 NOTE — Patient Instructions (Signed)
 Medication Changes:  No medication changes today!  Lab Work:   Go downstairs to [facility] on [address] to have your blood work completed.  We will only call you if the results are abnormal or if the provider would like to make medication changes.  No news is good news.     Testing/Procedures:  Your echo has been rescheduled.  Your physician has requested that you have an echocardiogram. Echocardiography is a painless test that uses sound waves to create images of your heart. It provides your doctor with information about the size and shape of your heart and how well your hearts chambers and valves are working. This procedure takes approximately one hour. There are no restrictions for this procedure. Please do NOT wear cologne, perfume, aftershave, or lotions (deodorant is allowed). Please arrive 15 minutes prior to your appointment time.  Please note: We ask at that you not bring children with you during ultrasound (echo/ vascular) testing. Due to room size and safety concerns, children are not allowed in the ultrasound rooms during exams. Our front office staff cannot provide observation of children in our lobby area while testing is being conducted. An adult accompanying a patient to their appointment will only be allowed in the ultrasound room at the discretion of the ultrasound technician under special circumstances. We apologize for any inconvenience.   Follow-Up in: Please follow up with the Advanced [facility] Failure Clinic in 3 months.   Thank you for choosing [facility].    At the [facility], you and your health needs are our priority. We have a designated team specialized in the treatment of Heart Failure. This Care Team includes your primary Heart Failure Specialized Cardiologist (physician), Advanced Practice Providers (APPs- Physician Assistants and Nurse Practitioners), and Pharmacist who all work together to provide  you with the care you need, when you need it.   You may see any of the following providers on your designated Care Team at your next follow up:  Dr. Toribio Fuel Dr. Ezra Shuck Dr. Ria Commander Dr. Morene Brownie Ellouise Class, FNP Jaun Bash, RPH-CPP  Please be sure to bring in all your medications bottles to every appointment.   Need to Contact Us :  If you have any questions or concerns before your next appointment please send us  a message through [place] or call our office at [phone].    TO LEAVE A MESSAGE FOR THE NURSE SELECT OPTION 2, PLEASE LEAVE A MESSAGE INCLUDING: YOUR NAME DATE OF BIRTH CALL BACK NUMBER REASON FOR CALL**this is important as we prioritize the call backs  YOU WILL RECEIVE A CALL BACK THE SAME DAY AS LONG AS YOU CALL BEFORE 4:00 PM

## 2024-11-13 ENCOUNTER — Ambulatory Visit: Payer: Self-pay | Admitting: Family

## 2024-11-13 NOTE — Progress Notes (Signed)
 Remote ICD Transmission

## 2024-11-15 ENCOUNTER — Ambulatory Visit: Payer: Self-pay | Admitting: Cardiology

## 2024-11-20 ENCOUNTER — Ambulatory Visit: Admitting: Podiatry

## 2024-11-25 ENCOUNTER — Ambulatory Visit

## 2024-11-27 ENCOUNTER — Other Ambulatory Visit: Payer: Self-pay | Admitting: Obstetrics and Gynecology

## 2024-11-27 DIAGNOSIS — R042 Hemoptysis: Secondary | ICD-10-CM

## 2024-11-27 DIAGNOSIS — R059 Cough, unspecified: Secondary | ICD-10-CM

## 2025-02-04 ENCOUNTER — Ambulatory Visit: Admitting: Cardiology
